# Patient Record
Sex: Male | Born: 1980
Health system: Southern US, Community
[De-identification: ages and names within clinical notes are randomized; demographics above are authoritative.]

## PROBLEM LIST (undated history)

## (undated) DIAGNOSIS — T63461A Toxic effect of venom of wasps, accidental (unintentional), initial encounter: Secondary | ICD-10-CM

## (undated) DIAGNOSIS — N182 Chronic kidney disease, stage 2 (mild): Secondary | ICD-10-CM

## (undated) DIAGNOSIS — E119 Type 2 diabetes mellitus without complications: Secondary | ICD-10-CM

## (undated) DIAGNOSIS — I639 Cerebral infarction, unspecified: Secondary | ICD-10-CM

## (undated) DIAGNOSIS — H9209 Otalgia, unspecified ear: Secondary | ICD-10-CM

## (undated) DIAGNOSIS — I10 Essential (primary) hypertension: Secondary | ICD-10-CM

## (undated) HISTORY — DX: Cerebral infarction, unspecified: I63.9

---

## 2011-05-17 ENCOUNTER — Encounter (HOSPITAL_COMMUNITY): Payer: Self-pay

## 2011-05-17 ENCOUNTER — Emergency Department (INDEPENDENT_AMBULATORY_CARE_PROVIDER_SITE_OTHER)
Admission: EM | Admit: 2011-05-17 | Discharge: 2011-05-17 | Disposition: A | Discharge status: Home / Self Care | Payer: Self-pay | Source: Home / Self Care | Attending: Emergency Medicine | Admitting: Emergency Medicine

## 2011-05-17 DIAGNOSIS — H11431 Conjunctival hyperemia, right eye: Secondary | ICD-10-CM

## 2011-05-17 DIAGNOSIS — H11439 Conjunctival hyperemia, unspecified eye: Secondary | ICD-10-CM

## 2011-05-17 MED ORDER — TOBRAMYCIN 0.3 % OP SOLN: 1.0000 [drp] | Freq: Four times a day (QID) | OPHTHALMIC | Status: AC

## 2011-05-17 NOTE — ED Notes (Signed)
Pt c/o right eye redness, pain, states had drainage and very sensitive to light. Symptoms started 2 days ago.

## 2011-05-17 NOTE — ED Provider Notes (Signed)
History     CSN: [id number]  Arrival date & time 05/17/11  [id number]   First MD Initiated Contact with Patient 05/17/11 [phone]      Chief Complaint  Patient presents with  . Conjunctivitis    redness to rt eye, drainage and photosensitive    (Consider location/radiation/quality/duration/timing/severity/associated sxs/prior treatment) HPI Comments: Started in the last few days with some eye redness, and itchy and feeling like he has some drainage in the morning Dont recall any injuries or rubbing eye  against anything, no sick contacts"      Patient is a 31 y.o. male presenting with conjunctivitis. The history is provided by the patient.  Conjunctivitis  The current episode started 2 days ago. The problem has been unchanged. The problem is moderate. Associated symptoms include eye itching, photophobia, eye pain and eye redness. Pertinent negatives include no fever, no decreased vision, no double vision, no ear pain, no headaches, no hearing loss, no rhinorrhea, no sore throat, no swollen glands and no neck pain. There is pain in the right eye. The eye pain is associated with movement. The eyelid exhibits swelling and redness.    History reviewed. No pertinent past medical history.  History reviewed. No pertinent past surgical history.  No family history on file.  History  Substance Use Topics  . Smoking status: Never Smoker   . Smokeless tobacco: Not on file  . Alcohol Use: No      Review of Systems  Constitutional: Negative for fever, activity change and fatigue.  HENT: Negative for hearing loss, ear pain, sore throat, rhinorrhea and neck pain.   Eyes: Positive for photophobia, pain, redness and itching. Negative for double vision and visual disturbance.  Neurological: Negative for weakness, numbness and headaches.    Allergies  Zithromax  Home Medications   Current Outpatient Rx  Name Route Sig Dispense Refill  . TOBRAMYCIN SULFATE 0.3 % OP SOLN Right Eye Place 1  drop into the right eye every 6 (six) hours. 5 mL 0    BP 154/113  Pulse 80  Temp(Src) 97.6 F (36.4 C) (Oral)  Resp 16  SpO2 95%  Physical Exam  Constitutional: He appears well-nourished. He appears lethargic.  HENT:  Mouth/Throat: No oropharyngeal exudate.  Eyes: EOM are normal. Right eye exhibits no discharge and no exudate. Left eye exhibits no exudate. Right conjunctiva is injected. No scleral icterus. Right pupil is not round.    Neurological: He appears lethargic. No cranial nerve deficit.    ED Course  Procedures (including critical care time)  Labs Reviewed - No data to display No results found.   1. Conjunctival hyperemia of right eye       MDM  Conjunctivitis like exam-no visual changes, no recent traumas or eye injuries, no nausea nor vomitting. No HA, incidentally noted elevated BP patient described  No ymtoms        Rosana Hoes, MD 05/17/11 1807

## 2011-05-17 NOTE — Discharge Instructions (Signed)
As during today's exam start with prescribed antibiotic eye drops and expect marked improvement in the next 48 hours if worsening symptoms or further changes have recommended you followup with the eye doctor as other conditions will need to be considered as discussed. If any specific symptoms such as nausea vomiting or fever should go to the emergency department for further evaluationHow to Take Your Blood Pressure  These instructions are only for electronic home blood pressure machines. You will need:   An automatic or semi-automatic blood pressure machine.   Fresh batteries for the blood pressure machine.  HOW DO I USE THESE TOOLS TO CHECK MY BLOOD PRESSURE?   There are 2 numbers that make up your blood pressure. For example: 120/80.   The first number (120 in our example) is called the "systolic pressure." It is a measure of the pressure in your blood vessels when your heart is pumping blood.   The second number (80 in our example) is called the "diastolic pressure." It is a measure of the pressure in your blood vessels when your heart is resting between beats.   Before you buy a home blood pressure machine, check the size of your arm so you can buy the right size cuff. Here is how to check the size of your arm:   Use a tape measure that shows both inches and centimeters.   Wrap the tape measure around the middle upper part of your arm. You may need someone to help you measure right.   Write down your arm measurement in both inches and centimeters.   To measure your blood pressure right, it is important to have the right size cuff.   If your arm is up to 13 inches (37 to 34 centimeters), get an adult cuff size.   If your arm is 13 to 17 inches (35 to 44 centimeters), get a large adult cuff size.   If your arm is 17 to 20 inches (45 to 52 centimeters), get an adult thigh cuff.   Try to rest or relax for at least 30 minutes before you check your blood pressure.   Do not smoke.   Do  not have any drinks with caffeine, such as:   Pop.   Coffee.   Tea.   Check your blood pressure in a quiet room.   Sit down and stretch out your arm on a table. Keep your arm at about the level of your heart. Let your arm relax.  GETTING BLOOD PRESSURE READINGS  Make sure you remove any tight-fighting clothing from your arm. Wrap the cuff around your upper arm. Wrap it just above the bend, and above where you felt the pulse. You should be able to slip a finger between the cuff and your arm. If you cannot slip a finger in the cuff, it is too tight and should be removed and rewrapped.   Some units requires you to manually pump up the arm cuff.   Automatic units inflate the cuff when you press a button.   Cuff deflation is automatic in both models.   After the cuff is inflated, the unit measures your blood pressure and pulse. The readings are displayed on a monitor. Hold still and breathe normally while the cuff is inflated.   Getting a reading takes less than a minute.   Some models store readings in a memory. Some provide a printout of readings.   Get readings at different times of the day. You should wait at least  5 minutes between readings. Take readings with you to your next doctor's visit.  Document Released: 01/30/2008 Document Revised: 02/05/2011 Document Reviewed: 01/30/2008 [facility] Patient Information 2012 [place].

## 2013-05-11 ENCOUNTER — Emergency Department (HOSPITAL_COMMUNITY)
Admission: EM | Admit: 2013-05-11 | Discharge: 2013-05-11 | Disposition: A | Discharge status: Home / Self Care | Payer: BC Managed Care – PPO | Attending: Emergency Medicine | Admitting: Emergency Medicine

## 2013-05-11 ENCOUNTER — Emergency Department (HOSPITAL_COMMUNITY): Payer: BC Managed Care – PPO

## 2013-05-11 ENCOUNTER — Encounter (HOSPITAL_COMMUNITY): Payer: Self-pay | Admitting: Emergency Medicine

## 2013-05-11 DIAGNOSIS — I10 Essential (primary) hypertension: Secondary | ICD-10-CM

## 2013-05-11 DIAGNOSIS — R5383 Other fatigue: Secondary | ICD-10-CM

## 2013-05-11 DIAGNOSIS — K920 Hematemesis: Secondary | ICD-10-CM | POA: Insufficient documentation

## 2013-05-11 DIAGNOSIS — R51 Headache: Secondary | ICD-10-CM | POA: Insufficient documentation

## 2013-05-11 DIAGNOSIS — R109 Unspecified abdominal pain: Secondary | ICD-10-CM | POA: Insufficient documentation

## 2013-05-11 DIAGNOSIS — R5381 Other malaise: Secondary | ICD-10-CM | POA: Insufficient documentation

## 2013-05-11 DIAGNOSIS — R112 Nausea with vomiting, unspecified: Secondary | ICD-10-CM | POA: Insufficient documentation

## 2013-05-11 LAB — URINE MICROSCOPIC-ADD ON

## 2013-05-11 LAB — CBC WITH DIFFERENTIAL/PLATELET
Basophils Absolute: 0 10*3/uL (ref 0.0–0.1)
Basophils Relative: 0 % (ref 0–1)
EOS ABS: 0 10*3/uL (ref 0.0–0.7)
EOS PCT: 0 % (ref 0–5)
HCT: 44.4 % (ref 39.0–52.0)
HEMOGLOBIN: 16.2 g/dL (ref 13.0–17.0)
LYMPHS ABS: 1.3 10*3/uL (ref 0.7–4.0)
Lymphocytes Relative: 13 % (ref 12–46)
MCH: 28.2 pg (ref 26.0–34.0)
MCHC: 36.5 g/dL — ABNORMAL HIGH (ref 30.0–36.0)
MCV: 77.2 fL — AB (ref 78.0–100.0)
MONOS PCT: 4 % (ref 3–12)
Monocytes Absolute: 0.3 10*3/uL (ref 0.1–1.0)
Neutro Abs: 8.1 10*3/uL — ABNORMAL HIGH (ref 1.7–7.7)
Neutrophils Relative %: 83 % — ABNORMAL HIGH (ref 43–77)
PLATELETS: 238 10*3/uL (ref 150–400)
RBC: 5.75 MIL/uL (ref 4.22–5.81)
RDW: 13.1 % (ref 11.5–15.5)
WBC: 9.8 10*3/uL (ref 4.0–10.5)

## 2013-05-11 LAB — LIPASE, BLOOD: LIPASE: 20 U/L (ref 11–59)

## 2013-05-11 LAB — URINALYSIS, ROUTINE W REFLEX MICROSCOPIC
Bilirubin Urine: NEGATIVE
Glucose, UA: >1000 mg/dL — AB
Ketones, ur: NEGATIVE mg/dL
Leukocytes, UA: NEGATIVE
NITRITE: NEGATIVE
Protein, ur: >300 mg/dL — AB
SPECIFIC GRAVITY, URINE: 1.041 — AB (ref 1.005–1.030)
UROBILINOGEN UA: 0.2 mg/dL (ref 0.0–1.0)
pH: 5 (ref 5.0–8.0)

## 2013-05-11 LAB — COMPREHENSIVE METABOLIC PANEL
ALT: 22 U/L (ref 0–53)
AST: 18 U/L (ref 0–37)
Albumin: 4.3 g/dL (ref 3.5–5.2)
Alkaline Phosphatase: 84 U/L (ref 39–117)
BUN: 16 mg/dL (ref 6–23)
CALCIUM: 9.9 mg/dL (ref 8.4–10.5)
CO2: 23 mEq/L (ref 19–32)
CREATININE: 1.11 mg/dL (ref 0.50–1.35)
Chloride: 100 mEq/L (ref 96–112)
GFR calc non Af Amer: 88 mL/min — ABNORMAL LOW (ref >90)
GLUCOSE: 261 mg/dL — AB (ref 70–99)
Potassium: 3.6 mEq/L — ABNORMAL LOW (ref 3.7–5.3)
Sodium: 139 mEq/L (ref 137–147)
TOTAL PROTEIN: 8.6 g/dL — AB (ref 6.0–8.3)
Total Bilirubin: 0.8 mg/dL (ref 0.3–1.2)

## 2013-05-11 LAB — POC OCCULT BLOOD, ED: FECAL OCCULT BLD: NEGATIVE

## 2013-05-11 MED ORDER — ONDANSETRON HCL 4 MG/2ML IJ SOLN
4.0000 mg | INTRAMUSCULAR | Status: AC
  Administered 2013-05-11: 4 mg via INTRAVENOUS
  Filled 2013-05-11: qty 2

## 2013-05-11 MED ORDER — SODIUM CHLORIDE 0.9 % IV BOLUS (SEPSIS)
500.0000 mL | Freq: Once | INTRAVENOUS | Status: AC
  Administered 2013-05-11: 500 mL via INTRAVENOUS

## 2013-05-11 MED ORDER — ONDANSETRON HCL 4 MG/2ML IJ SOLN
4.0000 mg | Freq: Once | INTRAMUSCULAR | Status: AC
  Administered 2013-05-11: 4 mg via INTRAVENOUS
  Filled 2013-05-11: qty 2

## 2013-05-11 MED ORDER — MORPHINE SULFATE 4 MG/ML IJ SOLN
8.0000 mg | Freq: Once | INTRAMUSCULAR | Status: AC
  Administered 2013-05-11: 8 mg via INTRAVENOUS
  Filled 2013-05-11: qty 2

## 2013-05-11 MED ORDER — ONDANSETRON HCL 4 MG PO TABS: 4.0000 mg | ORAL_TABLET | Freq: Four times a day (QID) | ORAL | Status: DC

## 2013-05-11 MED ORDER — PANTOPRAZOLE SODIUM 40 MG IV SOLR
40.0000 mg | Freq: Once | INTRAVENOUS | Status: AC
  Administered 2013-05-11: 40 mg via INTRAVENOUS
  Filled 2013-05-11: qty 40

## 2013-05-11 MED ORDER — HYDROCHLOROTHIAZIDE 12.5 MG PO CAPS
12.5000 mg | ORAL_CAPSULE | Freq: Every day | ORAL | Status: DC
  Administered 2013-05-11: 12.5 mg via ORAL
  Filled 2013-05-11: qty 1

## 2013-05-11 MED ORDER — OMEPRAZOLE 20 MG PO CPDR: 20.0000 mg | DELAYED_RELEASE_CAPSULE | Freq: Every day | ORAL | Status: DC

## 2013-05-11 MED ORDER — HYDROCHLOROTHIAZIDE 12.5 MG PO TABS: 12.5000 mg | ORAL_TABLET | Freq: Every day | ORAL | Status: DC

## 2013-05-11 NOTE — ED Provider Notes (Signed)
2030 - Patient care assumed from Domenic Moras, PA-C at shift change. Patient presents to the emergency department for nausea and vomiting suspect with small amounts of blood and free of clots. Patient also with complaint of headache. He was found to be hypertensive on arrival. Patient currently has CT head pending. The remainder of his workup has been unremarkable. Plan discussed with Haywood Pao which includes d/c home if improved abdominal TTP and negative head CT scan.  2210 - Patient's head CT is negative today. On my evaluation of the patient, he is sleeping soundly in the exam room bed in no visible or audible discomfort. Abdomen palpated with no areas of significant focal tenderness. No peritoneal signs or guarding. Patient is stable for discharge today with prescription for omeprazole and hydrochlorothiazide to take daily for symptoms and blood pressure. Have prescribed Zofran to take as needed for nausea/vomiting. Return precautions discussed with patient's wife at bedside and provided upon discharge. Patient agreeable to plan with no unaddressed concerns.   Results for orders placed during the hospital encounter of 05/11/13  CBC WITH DIFFERENTIAL      Result Value Ref Range   WBC 9.8  4.0 - 10.5 K/uL   RBC 5.75  4.22 - 5.81 MIL/uL   Hemoglobin 16.2  13.0 - 17.0 g/dL   HCT 44.4  39.0 - 52.0 %   MCV 77.2 (*) 78.0 - 100.0 fL   MCH 28.2  26.0 - 34.0 pg   MCHC 36.5 (*) 30.0 - 36.0 g/dL   RDW 13.1  11.5 - 15.5 %   Platelets 238  150 - 400 K/uL   Neutrophils Relative % 83 (*) 43 - 77 %   Neutro Abs 8.1 (*) 1.7 - 7.7 K/uL   Lymphocytes Relative 13  12 - 46 %   Lymphs Abs 1.3  0.7 - 4.0 K/uL   Monocytes Relative 4  3 - 12 %   Monocytes Absolute 0.3  0.1 - 1.0 K/uL   Eosinophils Relative 0  0 - 5 %   Eosinophils Absolute 0.0  0.0 - 0.7 K/uL   Basophils Relative 0  0 - 1 %   Basophils Absolute 0.0  0.0 - 0.1 K/uL  COMPREHENSIVE METABOLIC PANEL      Result Value Ref Range   Sodium 139  137 -  147 mEq/L   Potassium 3.6 (*) 3.7 - 5.3 mEq/L   Chloride 100  96 - 112 mEq/L   CO2 23  19 - 32 mEq/L   Glucose, Bld 261 (*) 70 - 99 mg/dL   BUN 16  6 - 23 mg/dL   Creatinine, Ser 1.11  0.50 - 1.35 mg/dL   Calcium 9.9  8.4 - 10.5 mg/dL   Total Protein 8.6 (*) 6.0 - 8.3 g/dL   Albumin 4.3  3.5 - 5.2 g/dL   AST 18  0 - 37 U/L   ALT 22  0 - 53 U/L   Alkaline Phosphatase 84  39 - 117 U/L   Total Bilirubin 0.8  0.3 - 1.2 mg/dL   GFR calc non Af Amer 88 (*) >90 mL/min   GFR calc Af Amer >90  >90 mL/min  LIPASE, BLOOD      Result Value Ref Range   Lipase 20  11 - 59 U/L  URINALYSIS, ROUTINE W REFLEX MICROSCOPIC      Result Value Ref Range   Color, Urine YELLOW  YELLOW   APPearance CLEAR  CLEAR   Specific Gravity, Urine 1.041 (*)  1.005 - 1.030   pH 5.0  5.0 - 8.0   Glucose, UA >1000 (*) NEGATIVE mg/dL   Hgb urine dipstick TRACE (*) NEGATIVE   Bilirubin Urine NEGATIVE  NEGATIVE   Ketones, ur NEGATIVE  NEGATIVE mg/dL   Protein, ur >300 (*) NEGATIVE mg/dL   Urobilinogen, UA 0.2  0.0 - 1.0 mg/dL   Nitrite NEGATIVE  NEGATIVE   Leukocytes, UA NEGATIVE  NEGATIVE  URINE MICROSCOPIC-ADD ON      Result Value Ref Range   WBC, UA 0-2  <3 WBC/hpf   RBC / HPF 0-2  <3 RBC/hpf   Bacteria, UA RARE  RARE   Casts HYALINE CASTS (*) NEGATIVE   Urine-Other MUCOUS PRESENT    POC OCCULT BLOOD, ED      Result Value Ref Range   Fecal Occult Bld NEGATIVE  NEGATIVE   Ct Head Wo Contrast  05/11/2013   CLINICAL DATA:  Nausea, vomiting, hypertension  EXAM: CT HEAD WITHOUT CONTRAST  TECHNIQUE: Contiguous axial images were obtained from the base of the skull through the vertex without contrast.  COMPARISON:  None  FINDINGS: Normal appearance of the intracranial structures. No evidence for acute hemorrhage, mass lesion, midline shift, hydrocephalus or large infarct. No acute bony abnormality. The visualized sinuses are clear.  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  No acute skull abnormality.  IMPRESSION: No acute intracranial abnormality.   Electronically Signed   By: Daryll Brod M.D.   On: 05/11/2013 20:26      Antonietta Breach, PA-C 05/11/13 2221

## 2013-05-11 NOTE — Discharge Instructions (Signed)
Take omeprazole and hydrochlorothiazide as prescribed for symptoms. Recommend you take Zofran as needed for nausea/vomiting. Followup with a primary care provider regarding your symptoms. Return to the emergency department if symptoms worsen such as if abdominal pain worsens, you began to vomit large amounts of blood or clots, you develop a fever, or if you develop any of the symptoms listed below.  Nausea and Vomiting Nausea is a sick feeling that often comes before throwing up (vomiting). Vomiting is a reflex where stomach contents come out of your mouth. Vomiting can cause severe loss of body fluids (dehydration). Children and elderly adults can become dehydrated quickly, especially if they also have diarrhea. Nausea and vomiting are symptoms of a condition or disease. It is important to find the cause of your symptoms. CAUSES   Direct irritation of the stomach lining. This irritation can result from increased acid production (gastroesophageal reflux disease), infection, food poisoning, taking certain medicines (such as nonsteroidal anti-inflammatory drugs), alcohol use, or tobacco use.  Signals from the brain.These signals could be caused by a headache, heat exposure, an inner ear disturbance, increased pressure in the brain from injury, infection, a tumor, or a concussion, pain, emotional stimulus, or metabolic problems.  An obstruction in the gastrointestinal tract (bowel obstruction).  Illnesses such as diabetes, hepatitis, gallbladder problems, appendicitis, kidney problems, cancer, sepsis, atypical symptoms of a heart attack, or eating disorders.  Medical treatments such as chemotherapy and radiation.  Receiving medicine that makes you sleep (general anesthetic) during surgery. DIAGNOSIS Your caregiver may ask for tests to be done if the problems do not improve after a few days. Tests may also be done if symptoms are severe or if the reason for the nausea and vomiting is not clear. Tests  may include:  Urine tests.  Blood tests.  Stool tests.  Cultures (to look for evidence of infection).  X-rays or other imaging studies. Test results can help your caregiver make decisions about treatment or the need for additional tests. TREATMENT You need to stay well hydrated. Drink frequently but in small amounts.You may wish to drink water, sports drinks, clear broth, or eat frozen ice pops or gelatin dessert to help stay hydrated.When you eat, eating slowly may help prevent nausea.There are also some antinausea medicines that may help prevent nausea. HOME CARE INSTRUCTIONS   Take all medicine as directed by your caregiver.  If you do not have an appetite, do not force yourself to eat. However, you must continue to drink fluids.  If you have an appetite, eat a normal diet unless your caregiver tells you differently.  Eat a variety of complex carbohydrates (rice, wheat, potatoes, bread), lean meats, yogurt, fruits, and vegetables.  Avoid high-fat foods because they are more difficult to digest.  Drink enough water and fluids to keep your urine clear or pale yellow.  If you are dehydrated, ask your caregiver for specific rehydration instructions. Signs of dehydration may include:  Severe thirst.  Dry lips and mouth.  Dizziness.  Dark urine.  Decreasing urine frequency and amount.  Confusion.  Rapid breathing or pulse. SEEK IMMEDIATE MEDICAL CARE IF:   You have blood or brown flecks (like coffee grounds) in your vomit.  You have black or bloody stools.  You have a severe headache or stiff neck.  You are confused.  You have severe abdominal pain.  You have chest pain or trouble breathing.  You do not urinate at least once every 8 hours.  You develop cold or clammy skin.  You continue to vomit for longer than 24 to 48 hours.  You have a fever. MAKE SURE YOU:   Understand these instructions.  Will watch your condition.  Will get help right away  if you are not doing well or get worse. Document Released: 02/16/2005 Document Revised: 05/11/2011 Document Reviewed: 07/16/2010 [facility] Patient Information 2014 [place], [place].  Emergency Department Resource Guide 1) Find a Doctor and Pay Out of Pocket Although you won't have to find out who is covered by your insurance plan, it is a good idea to ask around and get recommendations. You will then need to call the office and see if the doctor you have chosen will accept you as a new patient and what types of options they offer for patients who are self-pay. Some doctors offer discounts or will set up payment plans for their patients who do not have insurance, but you will need to ask so you aren't surprised when you get to your appointment.  2) Contact Your Local Health Department Not all health departments have doctors that can see patients for sick visits, but many do, so it is worth a call to see if yours does. If you don't know where your local health department is, you can check in your phone book. The CDC also has a tool to help you locate your state's health department, and many state websites also have listings of all of their local health departments.  3) Find a [facility] If your illness is not likely to be very severe or complicated, you may want to try a walk in clinic. These are popping up all over the country in pharmacies, drugstores, and shopping centers. They're usually staffed by nurse practitioners or physician assistants that have been trained to treat common illnesses and complaints. They're usually fairly quick and inexpensive. However, if you have serious medical issues or chronic medical problems, these are probably not your best option.  No Primary Care Doctor: - Call Health Connect at  [phone] - they can help you locate a primary care doctor that  accepts your insurance, provides certain services, etc. - Physician Referral Service- [phone]  Chronic Pain  Problems: Organization         Address  Phone   Notes  [facility]  [phone] Patients need to be referred by their primary care doctor.   Medication Assistance: Organization         Address  Phone   Notes  [facility] [address] [place], [place] [address] [phone] --Must be a resident of [facility] -- Must have NO insurance coverage whatsoever (no Medicaid/ Medicare, etc.) -- The pt. MUST have a primary care doctor that directs their care regularly and follows them in the community   Med[facility]ist  [phone]   [employer]  [phone]    Agencies that provide inexpensive medical care: Organization         Address  Phone   Notes  [place]  [phone]   [facility]    [phone]   [facility] [place], [place] [address] [phone]   [place] [address], [place] [phone]   Planned Parenthood    [phone]   [facility]    [phone]   Latta and [place]ine [place], [place] Phone:  [phone],  Fax:  [phone] Hours of Operation:  9 am - 6 pm, M-F.  Also accepts Medicaid/Medicare and self-pay.  [facility] for [place], [address], [place] Phone: [phone], Fax: [phone]. Hours of Operation:  8:30 am - 5:30 pm, M-F.  Also accepts Medicaid and self-pay.  [facility] [address] [place] Phone: [phone]   [place], [place], [place] [phone], Ext. [phone] Mondays & Thursdays: 7-9 AM.  First 15 patients are seen on a first come, first serve basis.    [place] Providers:  Organization         Address  Phone   Notes  [facility] [address], [place] [phone] Also  accepts self-pay patients.  [facility] [id number] [place], [place]  [phone]   [place], [address], [place] [phone]   [facility] [address], [place] [phone]   Lucianne Lei [address], [place]   [phone] Only accepts [place] Access [place] patients after they have their name applied to their card.   Self-Pay (no insurance) in [facility]:  Organization         Address  Phone   Notes  Sickle Cell Patients, [facility] Internal Medicine [place] [phone]   [facility] [place] [phone]   [facility] [place]  [place], [place], [place] [phone]   [facility]/Dr. Osei-Bonsu  [address], [place] or [address], [place] [phone] Phone number for both [place] and [place] locations is the same.  Urgent Medical and [facility] [address] [place] [phone]   [facility] [address] [place] or [address] [phone]   [facility] [address] [place] [phone], phone; [phone], fax Sees patients 1st and 3rd Saturday of every month.  Must not qualify for public or private insurance (i.e. Medicaid, Medicare, [place] Health Choice, Veterans' Benefits)  Household income should be no more than 200% of the poverty level The clinic cannot treat you if you are pregnant or think you are pregnant  Sexually transmitted diseases are not treated at the clinic.    Dental Care: Organization         Address  Phone  Notes  [facility] Department of [facility] [place] [phone] Accepts children up to age 31 who are enrolled in [place] or [place]; pregnant  women with a Medicaid card; and children who have applied for Medicaid or [place] Health Choice, but were declined, whose parents can pay a reduced fee at time of service.  [facility] Department of [facility]  [address], [place] [phone] Accepts children up to age 14 who are enrolled in [place] or [place]; pregnant women with a Medicaid card; and children who have applied for Medicaid or [place] Health Choice, but were declined, whose parents can pay a reduced fee at time of service.  [facility] Adult Dental Access PROGRAM  [place] [phone] Patients are seen by appointment only. Walk-ins are not  accepted. [place] will see patients 39 years of age and older. Monday - Tuesday (8am-5pm) Most Wednesdays (8:30-5pm) $30 per visit, cash only  [facility] Adult Dental Access PROGRAM  [address], [facility] [phone] Patients are seen by appointment only. Walk-ins are not accepted. [place] will see patients 37 years of age and older. One Wednesday Evening (Monthly: Volunteer Based).  $30 per visit, cash only  [place]  [phone] for adults; Children under age 12, call Graduate Pediatric Dentistry at [phone]. Children aged 36-14, please call [phone] to request a pediatric application.  Dental services are provided in all areas of dental care including fillings, crowns and bridges, complete and partial dentures, implants, gum treatment, root canals, and extractions. Preventive care is also provided. Treatment is provided to both adults and children. Patients are selected via a lottery and there is often a waiting list.   [facility] [address] [place]  [phone] [email or web address]   [facility] [address] [place], [place] [phone], Ext. [phone] Second and Fourth Thursday of each month, opens at 6:30 AM; Clinic ends at 9 AM.  Patients are  seen on a first-come first-served basis, and a limited number are seen during each clinic.   [facility]  [address] Hillard Danker [place], [place] [phone]   Eligibility Requirements You must have lived in [place], [place], or [place] counties for at least the last three months.   You cannot be eligible for state or federal sponsored [employer], including [employer], [place], or [employer].   You generally cannot be eligible for healthcare insurance through your employer.    How to apply: Eligibility screenings are held every Tuesday and Wednesday afternoon from 1:00 pm until 4:00 pm. You do not need an appointment for the interview!  [facility] [address] [place], [place]   [place]  [place] Department  [place]  [phone]    Behavioral Health Resources in the Community: Intensive Outpatient Programs Organization         Address  Phone  Notes  [place] [place] [facility]. [address], [place], [place] [phone]   [facility] [address] [place], [place]   ADS: [facility] [address], [place], [place]   [place] [address],  [place], [place] or [phone]   Substance Abuse Resources Organization         Address  Phone  Notes  Alcohol and Drug Services  [phone]   [place]  [phone]   [facility] [place]   Chinita Pester  [phone]   Residential & Outpatient Substance Abuse Program  [phone]   Psychological Services Organization         Address  Phone  Notes  [facility] [place]  [place]  [phone]   [place] [address] [place] or [phone]    Mobile Crisis  Teams Organization         Address  Phone  Notes  [facility], Mobile Crisis Care Unit  [phone]   [facility]  [address] [place], [place]   [facility] [address], [place] [phone]    Self-Help/Support Groups  Organization         Address  Phone             Notes  [facility] [place] - variety of support groups  [place] Call for more information  Narcotics Anonymous (NA), [facility] [address], [employer] [place]  2 meetings at this location   Special educational needs teacher         Address  Phone  Notes  ASAP Residential Treatment [place],    [place]  [phone]   [facility]  [address] [place] [id number], [place], [place]   [place], [place] [phone] Admissions: 8am-3pm M-F  Incentives Substance [place] [address],    [place], [place] [phone]   [facility] [address], [place], [place]   [facility] [address],  [place], [place]   [facility] Programs - Intensive Outpatient [address] Kristeen [address], [place], [place]   [facility] [place].,  [place], [place] [phone] or [phone]   [facility] ([facility]) [address], [place], [place] Accepts Medicaid  [facility] [place] [address],  [place] [phone] Substance Abuse/Addiction Treatment   [facility] Organization         Address  Phone  Notes  [facility]  [phone]   Domenic Schwab, PhD [address] [place]lis [place], [place]   [phone] or [phone]   [place], [place] [phone]   [facility] [address], [place], [place] [phone]  Insurance/Medicaid/sponsorship through [facility] [address], Ste [place]                                    [place], [place] [phone] [place] [address] [place], [place] [phone]    Dr. Adele Schilder  [phone]   [facility] Dept. 1) [address] [place] 2) [place] 3)  [place] [address], [place] [phone]  [phone]   [place] [phone] or [phone] (After Hours)

## 2013-05-11 NOTE — ED Notes (Addendum)
Pt reports n/v that started at 1300, reports vomiting bright red blood. Reports lower abd pain, denies diarrhea. Hypertensive at triage, 205/119 pt denies hx of htn.

## 2013-05-11 NOTE — ED Provider Notes (Signed)
Medical screening examination/treatment/procedure(s) were conducted as a shared visit with non-physician practitioner(s) and myself.  I personally evaluated the patient during the encounter.   EKG Interpretation None       Results for orders placed during the hospital encounter of 05/11/13  CBC WITH DIFFERENTIAL      Result Value Ref Range   WBC 9.8  4.0 - 10.5 K/uL   RBC 5.75  4.22 - 5.81 MIL/uL   Hemoglobin 16.2  13.0 - 17.0 g/dL   HCT 44.4  39.0 - 52.0 %   MCV 77.2 (*) 78.0 - 100.0 fL   MCH 28.2  26.0 - 34.0 pg   MCHC 36.5 (*) 30.0 - 36.0 g/dL   RDW 13.1  11.5 - 15.5 %   Platelets 238  150 - 400 K/uL   Neutrophils Relative % 83 (*) 43 - 77 %   Neutro Abs 8.1 (*) 1.7 - 7.7 K/uL   Lymphocytes Relative 13  12 - 46 %   Lymphs Abs 1.3  0.7 - 4.0 K/uL   Monocytes Relative 4  3 - 12 %   Monocytes Absolute 0.3  0.1 - 1.0 K/uL   Eosinophils Relative 0  0 - 5 %   Eosinophils Absolute 0.0  0.0 - 0.7 K/uL   Basophils Relative 0  0 - 1 %   Basophils Absolute 0.0  0.0 - 0.1 K/uL  COMPREHENSIVE METABOLIC PANEL      Result Value Ref Range   Sodium 139  137 - 147 mEq/L   Potassium 3.6 (*) 3.7 - 5.3 mEq/L   Chloride 100  96 - 112 mEq/L   CO2 23  19 - 32 mEq/L   Glucose, Bld 261 (*) 70 - 99 mg/dL   BUN 16  6 - 23 mg/dL   Creatinine, Ser 1.11  0.50 - 1.35 mg/dL   Calcium 9.9  8.4 - 10.5 mg/dL   Total Protein 8.6 (*) 6.0 - 8.3 g/dL   Albumin 4.3  3.5 - 5.2 g/dL   AST 18  0 - 37 U/L   ALT 22  0 - 53 U/L   Alkaline Phosphatase 84  39 - 117 U/L   Total Bilirubin 0.8  0.3 - 1.2 mg/dL   GFR calc non Af Amer 88 (*) >90 mL/min   GFR calc Af Amer >90  >90 mL/min  LIPASE, BLOOD      Result Value Ref Range   Lipase 20  11 - 59 U/L  URINALYSIS, ROUTINE W REFLEX MICROSCOPIC      Result Value Ref Range   Color, Urine YELLOW  YELLOW   APPearance CLEAR  CLEAR   Specific Gravity, Urine 1.041 (*) 1.005 - 1.030   pH 5.0  5.0 - 8.0   Glucose, UA >1000 (*) NEGATIVE mg/dL   Hgb urine dipstick  TRACE (*) NEGATIVE   Bilirubin Urine NEGATIVE  NEGATIVE   Ketones, ur NEGATIVE  NEGATIVE mg/dL   Protein, ur >300 (*) NEGATIVE mg/dL   Urobilinogen, UA 0.2  0.0 - 1.0 mg/dL   Nitrite NEGATIVE  NEGATIVE   Leukocytes, UA NEGATIVE  NEGATIVE  URINE MICROSCOPIC-ADD ON      Result Value Ref Range   WBC, UA 0-2  <3 WBC/hpf   RBC / HPF 0-2  <3 RBC/hpf   Bacteria, UA RARE  RARE   Casts HYALINE CASTS (*) NEGATIVE   Urine-Other MUCOUS PRESENT    POC OCCULT BLOOD, ED      Result Value Ref Range  Fecal Occult Bld NEGATIVE  NEGATIVE   No results found.  Patient with diagnosis of hypertension in 2010. Never been on any medications. Suspect his blood pressures been running high for a while. Today the events that brought him in at about 10 10:30 in the morning he started with a headache that was bilateral frontal no acute visual changes or photophobia no focal findings. No fever. Then at 1:00 he started with some vomiting he's occasionally vomits some specks of blood no pool of blood. The headache resolved at 2:00 in the afternoon. But the vomiting has persisted. He has no sick contact exposure. Patient's renal function here is normal urinalysis does show some protein patient has no sniffing and leukocytosis or anemia. Patient's head CT is pending. If head CT is negative patient can be treated with antinausea medicine perhaps some Pepcid to calm the stomach further patient is being treated with protonic here but will be expensive for him to take that at home. Also patient will be started on antihypertensive meds probably hydrochlorothiazide given referral information for further followup. Do not feel that this is a hypertensive emergency. Patient has no evidence of severe chest pain no or evidence of significant shortness of breath no severe headache currently no focal findings. Nothing consistent with a stroke.  Mervin Kung, MD 05/11/13 2004

## 2013-05-11 NOTE — ED Provider Notes (Signed)
CSN: [id number]     Arrival date & time 05/11/13  1714 History   First MD Initiated Contact with Patient 05/11/13 1918     Chief Complaint  Patient presents with  . Emesis  . Hematemesis     (Consider location/radiation/quality/duration/timing/severity/associated sxs/prior Treatment) The history is provided by the patient.    33 year old male who presents complaining of nausea or vomiting. Patient states since 1:00 today he has been feeling nauseous, having mid abdominal pain, and has vomited 4 times. He is vomiting up food product with specks of bright red blood. States blood has been getting progressively worse. He endorsed having headache, and was started at 10:00 in improves around 2pm. Headache is bitemporal. No active headache. Vomiting around 1pm.  Report normal bowel movement, without hematochezia or melena.  Patient also states that he has felt drowsy since he arrived. History of high blood pressure, however patient does not have primary care provider and does not take any medication for hypertension. Denies alcohol use. Denies chest pain, SOB, back pain, dysuria, urinary frequency, focal weakness, numbness, swelling of extremities or calf pain.  History reviewed. No pertinent past medical history. History reviewed. No pertinent past surgical history. History reviewed. No pertinent family history. History  Substance Use Topics  . Smoking status: Never Smoker   . Smokeless tobacco: Not on file  . Alcohol Use: No    Review of Systems  Constitutional: Positive for fatigue.  Respiratory: Negative for cough, chest tightness and shortness of breath.   Cardiovascular: Negative for chest pain and leg swelling.  Gastrointestinal: Positive for nausea, vomiting and abdominal pain. Negative for diarrhea, constipation and blood in stool.  Genitourinary: Negative for dysuria, hematuria and difficulty urinating.  Musculoskeletal: Negative for back pain.  Neurological: Positive for weakness  and headaches.      Allergies  Zithromax  Home Medications  No current outpatient prescriptions on file. BP 230/139  Pulse 104  Temp(Src) 98.8 F (37.1 C) (Oral)  Resp 18  SpO2 95% Physical Exam  Constitutional: He is oriented to person, place, and time. He appears well-developed and well-nourished. No distress.  Eyes: Conjunctivae and EOM are normal. Pupils are equal, round, and reactive to light.  Neck: Normal range of motion. Neck supple.  Cardiovascular: Normal rate, regular rhythm and normal heart sounds.   Pulmonary/Chest: Effort normal and breath sounds normal.  Abdominal: Soft. There is tenderness. There is no rebound and no guarding.  Tender to moderate palpation in epigastric,lower right, lower left, and suprapubic abdomen  Genitourinary: Rectum normal and prostate normal.  Musculoskeletal: He exhibits no edema.  Neurological: He is alert and oriented to person, place, and time.  Neurologic exam:  Speech clear, pupils equal round reactive to light, extraocular movements intact  Normal peripheral visual fields Cranial nerves III through XII normal including no facial droop Follows commands, moves all extremities x4, normal strength to bilateral upper and lower extremities at all major muscle groups including grip Sensation normal to light touch Coordination intact, no limb ataxia, finger-nose-finger normal Rapid alternating movements normal No pronator drift Gait normal   Skin: Skin is warm and dry. He is not diaphoretic.  Psychiatric: He has a normal mood and affect.    ED Course  Procedures (including critical care time)  8:19 PM Patient here presents complaining of mild low abnormal pain with nausea, vomiting with specks of bright red blood. He also has elevated blood pressure of 230/139 today. He has no strokelike symptoms. Does have history of high blood  pressure but not taking medication. His blood pressure has dropped down to 99991111 systolic without any  intervention. Head CT ordered to rule out acute stroke. He has some abdominal pain he has a nonsurgical abdomen. His workup has been fairly unremarkable. He does have protein in his urine however normal renal function. Patient would likely benefit from PPI, H2 blocker, and hydrochlorothiazide at discharge. Care discussed with her, provider who will continue to monitor patient and we'll reevaluate patient.  CAre discussed with Dr. Rogene Houston.   Labs Review Labs Reviewed  CBC WITH DIFFERENTIAL - Abnormal; Notable for the following:    MCV 77.2 (*)    MCHC 36.5 (*)    Neutrophils Relative % 83 (*)    Neutro Abs 8.1 (*)    All other components within normal limits  COMPREHENSIVE METABOLIC PANEL - Abnormal; Notable for the following:    Potassium 3.6 (*)    Glucose, Bld 261 (*)    Total Protein 8.6 (*)    GFR calc non Af Amer 88 (*)    All other components within normal limits  URINALYSIS, ROUTINE W REFLEX MICROSCOPIC - Abnormal; Notable for the following:    Specific Gravity, Urine 1.041 (*)    Glucose, UA >1000 (*)    Hgb urine dipstick TRACE (*)    Protein, ur >300 (*)    All other components within normal limits  URINE MICROSCOPIC-ADD ON - Abnormal; Notable for the following:    Casts HYALINE CASTS (*)    All other components within normal limits  LIPASE, BLOOD  POC OCCULT BLOOD, ED   Imaging Review No results found.   EKG Interpretation None      MDM   Final diagnoses:  Hypertension  Abdominal pain  Hematemesis   BP 163/94  Pulse 102  Temp(Src) 98.8 F (37.1 C) (Oral)  Resp 16  SpO2 97%     Domenic Moras, PA-C 05/11/13 2022

## 2013-05-13 NOTE — ED Provider Notes (Signed)
Medical screening examination/treatment/procedure(s) were performed by non-physician practitioner and as supervising physician I was immediately available for consultation/collaboration.   EKG Interpretation None        Sharyon Cable, MD 05/13/13 1930

## 2013-05-16 NOTE — ED Provider Notes (Signed)
Medical screening examination/treatment/procedure(s) were performed by non-physician practitioner and as supervising physician I was immediately available for consultation/collaboration.   EKG Interpretation None       Mervin Kung, MD 05/16/13 [phone]

## 2013-09-28 ENCOUNTER — Encounter (HOSPITAL_COMMUNITY): Payer: Self-pay | Admitting: Emergency Medicine

## 2013-09-28 ENCOUNTER — Emergency Department (HOSPITAL_COMMUNITY)
Admission: EM | Admit: 2013-09-28 | Discharge: 2013-09-29 | Disposition: A | Discharge status: Home / Self Care | Payer: BC Managed Care – PPO | Attending: Emergency Medicine | Admitting: Emergency Medicine

## 2013-09-28 DIAGNOSIS — I10 Essential (primary) hypertension: Secondary | ICD-10-CM | POA: Insufficient documentation

## 2013-09-28 DIAGNOSIS — J029 Acute pharyngitis, unspecified: Secondary | ICD-10-CM | POA: Insufficient documentation

## 2013-09-28 DIAGNOSIS — B9789 Other viral agents as the cause of diseases classified elsewhere: Secondary | ICD-10-CM | POA: Insufficient documentation

## 2013-09-28 DIAGNOSIS — R509 Fever, unspecified: Secondary | ICD-10-CM | POA: Insufficient documentation

## 2013-09-28 DIAGNOSIS — B349 Viral infection, unspecified: Secondary | ICD-10-CM

## 2013-09-28 DIAGNOSIS — H538 Other visual disturbances: Secondary | ICD-10-CM | POA: Insufficient documentation

## 2013-09-28 HISTORY — DX: Essential (primary) hypertension: I10

## 2013-09-28 LAB — CBC WITH DIFFERENTIAL/PLATELET
Basophils Absolute: 0 10*3/uL (ref 0.0–0.1)
Basophils Relative: 0 % (ref 0–1)
EOS ABS: 0 10*3/uL (ref 0.0–0.7)
Eosinophils Relative: 0 % (ref 0–5)
HCT: 42 % (ref 39.0–52.0)
HEMOGLOBIN: 14.4 g/dL (ref 13.0–17.0)
LYMPHS ABS: 1.9 10*3/uL (ref 0.7–4.0)
LYMPHS PCT: 23 % (ref 12–46)
MCH: 28.1 pg (ref 26.0–34.0)
MCHC: 34.3 g/dL (ref 30.0–36.0)
MCV: 82 fL (ref 78.0–100.0)
MONOS PCT: 7 % (ref 3–12)
Monocytes Absolute: 0.6 10*3/uL (ref 0.1–1.0)
NEUTROS PCT: 70 % (ref 43–77)
Neutro Abs: 6 10*3/uL (ref 1.7–7.7)
Platelets: 219 10*3/uL (ref 150–400)
RBC: 5.12 MIL/uL (ref 4.22–5.81)
RDW: 12.7 % (ref 11.5–15.5)
WBC: 8.5 10*3/uL (ref 4.0–10.5)

## 2013-09-28 LAB — URINALYSIS, ROUTINE W REFLEX MICROSCOPIC
Glucose, UA: 100 mg/dL — AB
HGB URINE DIPSTICK: NEGATIVE
KETONES UR: 15 mg/dL — AB
Leukocytes, UA: NEGATIVE
NITRITE: NEGATIVE
Specific Gravity, Urine: 1.034 — ABNORMAL HIGH (ref 1.005–1.030)
UROBILINOGEN UA: 1 mg/dL (ref 0.0–1.0)
pH: 5.5 (ref 5.0–8.0)

## 2013-09-28 LAB — COMPREHENSIVE METABOLIC PANEL
ALK PHOS: 67 U/L (ref 39–117)
ALT: 15 U/L (ref 0–53)
ANION GAP: 17 — AB (ref 5–15)
AST: 16 U/L (ref 0–37)
Albumin: 4.1 g/dL (ref 3.5–5.2)
BILIRUBIN TOTAL: 1.5 mg/dL — AB (ref 0.3–1.2)
BUN: 15 mg/dL (ref 6–23)
CHLORIDE: 100 meq/L (ref 96–112)
CO2: 21 meq/L (ref 19–32)
Calcium: 9 mg/dL (ref 8.4–10.5)
Creatinine, Ser: 1.29 mg/dL (ref 0.50–1.35)
GFR, EST AFRICAN AMERICAN: 85 mL/min — AB (ref >90)
GFR, EST NON AFRICAN AMERICAN: 73 mL/min — AB (ref >90)
GLUCOSE: 136 mg/dL — AB (ref 70–99)
POTASSIUM: 3.5 meq/L — AB (ref 3.7–5.3)
Sodium: 138 mEq/L (ref 137–147)
TOTAL PROTEIN: 9 g/dL — AB (ref 6.0–8.3)

## 2013-09-28 LAB — URINE MICROSCOPIC-ADD ON

## 2013-09-28 LAB — RAPID STREP SCREEN (MED CTR MEBANE ONLY): Streptococcus, Group A Screen (Direct): NEGATIVE

## 2013-09-28 LAB — CBG MONITORING, ED: Glucose-Capillary: 158 mg/dL — ABNORMAL HIGH (ref 70–99)

## 2013-09-28 LAB — I-STAT CG4 LACTIC ACID, ED: Lactic Acid, Venous: 1.56 mmol/L (ref 0.5–2.2)

## 2013-09-28 MED ORDER — KETOROLAC TROMETHAMINE 30 MG/ML IJ SOLN
30.0000 mg | Freq: Once | INTRAMUSCULAR | Status: AC
  Administered 2013-09-28: 30 mg via INTRAVENOUS
  Filled 2013-09-28: qty 1

## 2013-09-28 MED ORDER — SODIUM CHLORIDE 0.9 % IV BOLUS (SEPSIS)
1000.0000 mL | Freq: Once | INTRAVENOUS | Status: AC
  Administered 2013-09-28: 1000 mL via INTRAVENOUS

## 2013-09-28 MED ORDER — DOXYCYCLINE HYCLATE 100 MG PO TABS
100.0000 mg | ORAL_TABLET | Freq: Once | ORAL | Status: AC
  Administered 2013-09-28: 100 mg via ORAL
  Filled 2013-09-28: qty 1

## 2013-09-28 MED ORDER — ACETAMINOPHEN 325 MG PO TABS
650.0000 mg | ORAL_TABLET | Freq: Four times a day (QID) | ORAL | Status: DC | PRN
  Administered 2013-09-28 (×2): 650 mg via ORAL
  Filled 2013-09-28 (×2): qty 2

## 2013-09-28 NOTE — ED Notes (Signed)
Checked patient blood sugar it was 158 notified RN Sabrina of blood sugar

## 2013-09-28 NOTE — ED Notes (Signed)
ED PA at bedside

## 2013-09-28 NOTE — ED Provider Notes (Signed)
CSN: [id number]     Arrival date & time 09/28/13  1620 History   First MD Initiated Contact with Patient 09/28/13 2057     Chief Complaint  Patient presents with  . Abdominal Pain  . Headache  . Chills  . Sore Throat     (Consider location/radiation/quality/duration/timing/severity/associated sxs/prior Treatment) HPI  33 yo male presents today with complaints of hA, fever, abd pain, fatigue, weakness, sore throat, and cough x 2 days.  Pt states he woke up yesterday morning feeling weak and fatigued. He progressively began feeling worse throughout the day.  HA has been constant since yesterday and points across his forehead when asked where the pain is located.  Harold Clements was 8/10 yesterday and prior to ED.  Pain was brought down to 6/10 with PO Tylenol in ED and is continuing to improve.  Abd pain is described as an intermittent, aching pain that is located on the left side of his abd.  Motrin was tried yesterday and did offer some relief.  Pt does not know of any aggravating factors.  Pt also describes a non-productive cough and sore throat that started yesterday.  Pt endorses weakness, fatigue, fever (not measured at home), chills, sweats, decreased appetite, and intermittent blurred vision.  Pt denies N, V, D, dysuria, dyspnea, chest pain, neck pain/stiffness, and rash.  He spends some time outside for work but this is the only time he spends outside, his work involves Interior and spatial designer.  Pt does not believe he has had a tick bite.  Pt's fianc checked pt for ticks 3 days ago after pt reported feeling somewhat sluggish.  No known sick contacts.  BP is elevated today. Pt states he takes no medication for his BP and does not have a PCP.           Past Medical History  Diagnosis Date  . Hypertension    History reviewed. No pertinent past surgical history. No family history on file. History  Substance Use Topics  . Smoking status: Never Smoker   . Smokeless tobacco: Not on file  . Alcohol Use: No     Review of Systems    Allergies  Zithromax  Home Medications   Prior to Admission medications   Medication Sig Start Date End Date Taking? Authorizing Provider  doxycycline (VIBRAMYCIN) 100 MG capsule Take 1 capsule (100 mg total) by mouth 2 (two) times daily. 09/29/13   Linus Mako, PA-C  HYDROcodone-acetaminophen (NORCO/VICODIN) 5-325 MG per tablet Take 1-2 tablets by mouth every 4 (four) hours as needed. 09/29/13   Malaijah Houchen Marilu Favre, PA-C  ondansetron (ZOFRAN) 4 MG tablet Take 1 tablet (4 mg total) by mouth every 6 (six) hours. 09/29/13   Iniya Matzek Marilu Favre, PA-C   BP 157/92  Pulse 75  Temp(Src) 98.1 F (36.7 C) (Oral)  Resp 13  SpO2 99% Physical Exam  Nursing note and vitals reviewed. Constitutional: He is oriented to person, place, and time. He appears well-developed and well-nourished. No distress.  HENT:  Head: Normocephalic and atraumatic.  Mouth/Throat: Uvula is midline, oropharynx is clear and moist and mucous membranes are normal.  Eyes: Pupils are equal, round, and reactive to light.  Neck: Normal range of motion. Neck supple. No spinous process tenderness and no muscular tenderness present. No Brudzinski's sign and no Kernig's sign noted.  Cardiovascular: Normal rate and regular rhythm.   Pulmonary/Chest: Effort normal. No respiratory distress. He has no decreased breath sounds. He has no wheezes. He has no rhonchi.  Abdominal:  Soft. Bowel sounds are normal. He exhibits no distension. There is no tenderness. There is no CVA tenderness.  Musculoskeletal:  No back pain  Neurological: He is alert and oriented to person, place, and time. He has normal strength. No cranial nerve deficit or sensory deficit. He displays a negative Romberg sign. GCS eye subscore is 4. GCS verbal subscore is 5. GCS motor subscore is 6.  Skin: Skin is warm and dry. No purpura and no rash noted. Rash is not pustular and not urticarial.    ED Course  Procedures (including critical care  time) Labs Review Labs Reviewed  COMPREHENSIVE METABOLIC PANEL - Abnormal; Notable for the following:    Potassium 3.5 (*)    Glucose, Bld 136 (*)    Total Protein 9.0 (*)    Total Bilirubin 1.5 (*)    GFR calc non Af Amer 73 (*)    GFR calc Af Amer 85 (*)    Anion gap 17 (*)    All other components within normal limits  URINALYSIS, ROUTINE W REFLEX MICROSCOPIC - Abnormal; Notable for the following:    Color, Urine AMBER (*)    Specific Gravity, Urine 1.034 (*)    Glucose, UA 100 (*)    Bilirubin Urine SMALL (*)    Ketones, ur 15 (*)    Protein, ur >300 (*)    All other components within normal limits  URINE MICROSCOPIC-ADD ON - Abnormal; Notable for the following:    Squamous Epithelial / LPF FEW (*)    Bacteria, UA FEW (*)    All other components within normal limits  CBG MONITORING, ED - Abnormal; Notable for the following:    Glucose-Capillary 158 (*)    All other components within normal limits  RAPID STREP SCREEN  CULTURE, GROUP A STREP  CBC WITH DIFFERENTIAL  B. BURGDORFI ANTIBODIES  ROCKY MTN SPOTTED FVR AB, IGG-BLOOD  ROCKY MTN SPOTTED FVR AB, IGM-BLOOD  I-STAT CG4 LACTIC ACID, ED    Imaging Review No results found.   EKG Interpretation None      MDM   Final diagnoses:  Fever, unspecified fever cause  Viral syndrome    Patient with fever, exposure to ticks and no other apparent source of infection. He has symptoms that hit multiple body systems. His headache is mild and bitermporal. NO neck pain, negative brudzinski and kernig's.He does not appear toxic. His pain resolves with pain medications and his fever resolves with Toradol. Highly doubt meningitis.   CBG and blood pressure elevated, he is on a waiting list for a PCP to address these issues- we had a [place] talk about the longer term consequences of not controlling BP and glucose. Rapid strep negative. Tick born illness titers sent out. I discussed case with Dr.James. Although we did not find a tick or  area of bite, due to exposure we will treat patient for this and f/u on the titers. I discussed plan with pt and his mother and he has been given strict return to ED precautions.  33 y.o.Harold Clements's evaluation in the Emergency Department is complete. It has been determined that no acute conditions requiring further emergency intervention are present at this time. The patient/guardian have been advised of the diagnosis and plan. We have discussed signs and symptoms that warrant return to the ED, such as changes or worsening in symptoms.  Vital signs are stable at discharge. Filed Vitals:   09/29/13 0147  BP:   Pulse:   Temp: 98.1 F (36.7  C)  Resp:     Patient/guardian has voiced understanding and agreed to follow-up with the PCP or specialist.    Linus Mako, PA-C 09/29/13 1747

## 2013-09-28 NOTE — ED Notes (Signed)
Pt is here with headache, eyes hurt, sore throat, blurred vision, cold chills, denies neck stiffness.  Blurred vision started last nite.

## 2013-09-28 NOTE — ED Notes (Signed)
Attempted IV access x2.  ?

## 2013-09-29 MED ORDER — ONDANSETRON HCL 4 MG PO TABS: 4.0000 mg | ORAL_TABLET | Freq: Four times a day (QID) | ORAL | Status: DC

## 2013-09-29 MED ORDER — DOXYCYCLINE HYCLATE 100 MG PO CAPS: 100.0000 mg | ORAL_CAPSULE | Freq: Two times a day (BID) | ORAL | Status: DC

## 2013-09-29 MED ORDER — HYDROCODONE-ACETAMINOPHEN 5-325 MG PO TABS: 1.0000 | ORAL_TABLET | ORAL | Status: DC | PRN

## 2013-09-29 MED ORDER — ACETAMINOPHEN 325 MG PO TABS: 650.0000 mg | ORAL_TABLET | Freq: Once | ORAL | Status: DC

## 2013-09-29 MED ORDER — HYDROMORPHONE HCL PF 1 MG/ML IJ SOLN
1.0000 mg | Freq: Once | INTRAMUSCULAR | Status: AC
  Administered 2013-09-29: 1 mg via INTRAVENOUS
  Filled 2013-09-29: qty 1

## 2013-09-29 NOTE — ED Notes (Signed)
This RN went in room at reassess pain- patient is asleep at this time. Respirations even and unlabored. No acute distress noted.

## 2013-09-29 NOTE — Discharge Instructions (Signed)
Fever, Adult °A fever is a higher than normal body temperature. In an adult, an oral temperature around 98.6° F (37° C) is considered normal. A temperature of 100.4° F (38° C) or higher is generally considered a fever. Mild or moderate fevers generally have no [place]-term effects and often do not require treatment. Extreme fever (greater than or equal to 106° F or 41.1° C) can cause seizures. The sweating that may occur with repeated or prolonged fever may cause dehydration. Elderly people can develop confusion during a fever. °A measured temperature can vary with: °· Age. °· Time of day. °· Method of measurement (mouth, underarm, rectal, or ear). °The fever is confirmed by taking a temperature with a thermometer. Temperatures can be taken different ways. Some methods are accurate and some are not. °· An oral temperature is used most commonly. Electronic thermometers are fast and accurate. °· An ear temperature will only be accurate if the thermometer is positioned as recommended by the manufacturer. °· A rectal temperature is accurate and done for those adults who have a condition where an oral temperature cannot be taken. °· An underarm (axillary) temperature is not accurate and not recommended. °Fever is a symptom, not a disease.  °CAUSES  °· Infections commonly cause fever. °· Some noninfectious causes for fever include: °¨ Some arthritis conditions. °¨ Some thyroid or adrenal gland conditions. °¨ Some immune system conditions. °¨ Some types of cancer. °¨ A medicine reaction. °¨ High doses of certain street drugs such as methamphetamine. °¨ Dehydration. °¨ Exposure to high outside or room temperatures. °· Occasionally, the source of a fever cannot be determined. This is sometimes called a "fever of unknown origin" (FUO). °· Some situations may lead to a temporary rise in body temperature that may go away on its own. Examples are: °¨ Childbirth. °¨ Surgery. °¨ Intense exercise. °HOME CARE INSTRUCTIONS  °· Take  appropriate medicines for fever. Follow dosing instructions carefully. If you use acetaminophen to reduce the fever, be careful to avoid taking other medicines that also contain acetaminophen. Do not take aspirin for a fever if you are younger than age 19. There is an association with Reye's syndrome. Reye's syndrome is a rare but potentially deadly disease. °· If an infection is present and antibiotics have been prescribed, take them as directed. Finish them even if you start to feel better. °· Rest as needed. °· Maintain an adequate fluid intake. To prevent dehydration during an illness with prolonged or recurrent fever, you may need to drink extra fluid. Drink enough fluids to keep your urine clear or pale yellow. °· Sponging or bathing with room temperature water may help reduce body temperature. Do not use ice water or alcohol sponge baths. °· Dress comfortably, but do not over-bundle. °SEEK MEDICAL CARE IF:  °· You are unable to keep fluids down. °· You develop vomiting or diarrhea. °· You are not feeling at least partly better after 3 days. °· You develop new symptoms or problems. °SEEK IMMEDIATE MEDICAL CARE IF:  °· You have shortness of breath or trouble breathing. °· You develop excessive weakness. °· You are dizzy or you faint. °· You are extremely thirsty or you are making little or no urine. °· You develop new pain that was not there before (such as in the head, neck, chest, back, or abdomen). °· You have persistent vomiting and diarrhea for more than 1 to 2 days. °· You develop a stiff neck or your eyes become sensitive to light. °· You develop a   skin rash.  You have a fever or persistent symptoms for more than 2 to 3 days.  You have a fever and your symptoms suddenly get worse. MAKE SURE YOU:   Understand these instructions.  Will watch your condition.  Will get help right away if you are not doing well or get worse. Document Released: 08/12/2000 Document Revised: 07/03/2013 Document  Reviewed: 12/18/2010 [facility] Patient Information 2015 [place], [place]. This information is not intended to replace advice given to you by your health care provider. Make sure you discuss any questions you have with your health care provider.  Viral Infections A viral infection can be caused by different types of viruses.Most viral infections are not serious and resolve on their own. However, some infections may cause severe symptoms and may lead to further complications. SYMPTOMS Viruses can frequently cause:  Minor sore throat.  Aches and pains.  Headaches.  Runny nose.  Different types of rashes.  Watery eyes.  Tiredness.  Cough.  Loss of appetite.  Gastrointestinal infections, resulting in nausea, vomiting, and diarrhea. These symptoms do not respond to antibiotics because the infection is not caused by bacteria. However, you might catch a bacterial infection following the viral infection. This is sometimes called a "superinfection." Symptoms of such a bacterial infection may include:  Worsening sore throat with pus and difficulty swallowing.  Swollen neck glands.  Chills and a high or persistent fever.  Severe headache.  Tenderness over the sinuses.  Persistent overall ill feeling (malaise), muscle aches, and tiredness (fatigue).  Persistent cough.  Yellow, green, or brown mucus production with coughing. HOME CARE INSTRUCTIONS   Only take over-the-counter or prescription medicines for pain, discomfort, diarrhea, or fever as directed by your caregiver.  Drink enough water and fluids to keep your urine clear or pale yellow. Sports drinks can provide valuable electrolytes, sugars, and hydration.  Get plenty of rest and maintain proper nutrition. Soups and broths with crackers or rice are fine. SEEK IMMEDIATE MEDICAL CARE IF:   You have severe headaches, shortness of breath, chest pain, neck pain, or an unusual rash.  You have uncontrolled vomiting, diarrhea,  or you are unable to keep down fluids.  You or your child has an oral temperature above 102 F (38.9 C), not controlled by medicine.  Your baby is older than 3 months with a rectal temperature of 102 F (38.9 C) or higher.  Your baby is 101 months old or younger with a rectal temperature of 100.4 F (38 C) or higher. MAKE SURE YOU:   Understand these instructions.  Will watch your condition.  Will get help right away if you are not doing well or get worse. Document Released: 11/26/2004 Document Revised: 05/11/2011 Document Reviewed: 06/23/2010 [facility] Patient Information 2015 [place], [place]. This information is not intended to replace advice given to you by your health care provider. Make sure you discuss any questions you have with your health care provider.

## 2013-09-30 LAB — CULTURE, GROUP A STREP

## 2013-10-02 LAB — B. BURGDORFI ANTIBODIES: B burgdorferi Ab IgG+IgM: 0.22 {ISR}

## 2013-10-03 LAB — ROCKY MTN SPOTTED FVR AB, IGG-BLOOD: RMSF IGG: 1.21 IV — AB

## 2013-10-03 LAB — ROCKY MTN SPOTTED FVR AB, IGM-BLOOD: RMSF IgM: 0.05 IV (ref 0.00–0.89)

## 2013-10-03 NOTE — ED Provider Notes (Signed)
Medical screening examination/treatment/procedure(s) were performed by non-physician practitioner and as supervising physician I was immediately available for consultation/collaboration.   EKG Interpretation None        Tanna Furry, MD 10/03/13 2348

## 2013-10-04 ENCOUNTER — Telehealth (HOSPITAL_COMMUNITY): Payer: Self-pay

## 2013-10-05 ENCOUNTER — Telehealth (HOSPITAL_BASED_OUTPATIENT_CLINIC_OR_DEPARTMENT_OTHER): Payer: Self-pay | Admitting: Emergency Medicine

## 2013-12-27 ENCOUNTER — Encounter (HOSPITAL_COMMUNITY): Payer: Self-pay | Admitting: Emergency Medicine

## 2013-12-27 ENCOUNTER — Emergency Department (INDEPENDENT_AMBULATORY_CARE_PROVIDER_SITE_OTHER)
Admission: EM | Admit: 2013-12-27 | Discharge: 2013-12-27 | Disposition: A | Discharge status: Home / Self Care | Payer: BC Managed Care – PPO | Source: Home / Self Care | Attending: Emergency Medicine | Admitting: Emergency Medicine

## 2013-12-27 DIAGNOSIS — N183 Chronic kidney disease, stage 3 unspecified: Secondary | ICD-10-CM

## 2013-12-27 DIAGNOSIS — J020 Streptococcal pharyngitis: Secondary | ICD-10-CM

## 2013-12-27 DIAGNOSIS — E1122 Type 2 diabetes mellitus with diabetic chronic kidney disease: Secondary | ICD-10-CM

## 2013-12-27 DIAGNOSIS — N189 Chronic kidney disease, unspecified: Secondary | ICD-10-CM

## 2013-12-27 DIAGNOSIS — I10 Essential (primary) hypertension: Secondary | ICD-10-CM

## 2013-12-27 LAB — POCT I-STAT, CHEM 8
BUN: 17 mg/dL (ref 6–23)
Calcium, Ion: 1.18 mmol/L (ref 1.12–1.23)
Chloride: 102 mEq/L (ref 96–112)
Creatinine, Ser: 1.6 mg/dL — ABNORMAL HIGH (ref 0.50–1.35)
Glucose, Bld: 220 mg/dL — ABNORMAL HIGH (ref 70–99)
HCT: 45 % (ref 39.0–52.0)
HEMOGLOBIN: 15.3 g/dL (ref 13.0–17.0)
POTASSIUM: 3.4 meq/L — AB (ref 3.7–5.3)
Sodium: 139 mEq/L (ref 137–147)
TCO2: 26 mmol/L (ref 0–100)

## 2013-12-27 LAB — POCT RAPID STREP A: STREPTOCOCCUS, GROUP A SCREEN (DIRECT): POSITIVE — AB

## 2013-12-27 MED ORDER — HYDROCHLOROTHIAZIDE 25 MG PO TABS: 25.0000 mg | ORAL_TABLET | Freq: Every day | ORAL | Status: DC

## 2013-12-27 MED ORDER — PENICILLIN G BENZATHINE 1200000 UNIT/2ML IM SUSP
INTRAMUSCULAR | Status: AC
  Filled 2013-12-27: qty 2

## 2013-12-27 MED ORDER — PENICILLIN G BENZATHINE 1200000 UNIT/2ML IM SUSP
1.2000 10*6.[IU] | Freq: Once | INTRAMUSCULAR | Status: AC
  Administered 2013-12-27: 1.2 10*6.[IU] via INTRAMUSCULAR

## 2013-12-27 MED ORDER — AMLODIPINE BESYLATE 5 MG PO TABS: 5.0000 mg | ORAL_TABLET | Freq: Every day | ORAL | Status: DC

## 2013-12-27 MED ORDER — GLIMEPIRIDE 1 MG PO TABS: 1.0000 mg | ORAL_TABLET | Freq: Every day | ORAL | Status: DC

## 2013-12-27 NOTE — Discharge Instructions (Signed)
Strep Throat °Strep throat is an infection of the throat caused by a bacteria named Streptococcus pyogenes. Your health care provider may call the infection streptococcal "tonsillitis" or "pharyngitis" depending on whether there are signs of inflammation in the tonsils or back of the throat. Strep throat is most common in children aged 31-15 years during the cold months of the year, but it can occur in people of any age during any season. This infection is spread from person to person (contagious) through coughing, sneezing, or other close contact. °SIGNS AND SYMPTOMS  °· Fever or chills. °· Painful, swollen, red tonsils or throat. °· Pain or difficulty when swallowing. °· White or yellow spots on the tonsils or throat. °· Swollen, tender lymph nodes or "glands" of the neck or under the jaw. °· Red rash all over the body (rare). °DIAGNOSIS  °Many different infections can cause the same symptoms. A test must be done to confirm the diagnosis so the right treatment can be given. A "rapid strep test" can help your health care provider make the diagnosis in a few minutes. If this test is not available, a light swab of the infected area can be used for a throat culture test. If a throat culture test is done, results are usually available in a day or two. °TREATMENT  °Strep throat is treated with antibiotic medicine. °HOME CARE INSTRUCTIONS  °· Gargle with 1 tsp of salt in 1 cup of warm water, 3-4 times per day or as needed for comfort. °· Family members who also have a sore throat or fever should be tested for strep throat and treated with antibiotics if they have the strep infection. °· Make sure everyone in your household washes their hands well. °· Do not share food, drinking cups, or personal items that could cause the infection to spread to others. °· You may need to eat a soft food diet until your sore throat gets better. °· Drink enough water and fluids to keep your urine clear or pale yellow. This will help prevent  dehydration. °· Get plenty of rest. °· Stay home from school, day care, or work until you have been on antibiotics for 24 hours. °· Take medicines only as directed by your health care provider. °· Take your antibiotic medicine as directed by your health care provider. Finish it even if you start to feel better. °SEEK MEDICAL CARE IF:  °· The glands in your neck continue to enlarge. °· You develop a rash, cough, or earache. °· You cough up green, yellow-brown, or bloody sputum. °· You have pain or discomfort not controlled by medicines. °· Your problems seem to be getting worse rather than better. °· You have a fever. °SEEK IMMEDIATE MEDICAL CARE IF:  °· You develop any new symptoms such as vomiting, severe headache, stiff or painful neck, chest pain, shortness of breath, or trouble swallowing. °· You develop severe throat pain, drooling, or changes in your voice. °· You develop swelling of the neck, or the skin on the neck becomes red and tender. °· You develop signs of dehydration, such as fatigue, dry mouth, and decreased urination. °· You become increasingly sleepy, or you cannot wake up completely. °MAKE SURE YOU: °· Understand these instructions. °· Will watch your condition. °· Will get help right away if you are not doing well or get worse. °Document Released: 02/14/2000 Document Revised: 07/03/2013 Document Reviewed: 04/17/2010 °[employer]® Patient Information ©2015 [employer]. This information is not intended to replace advice given to you by   your health care provider. Make sure you discuss any questions you have with your health care provider.  Blood pressure over the ideal can put you at higher risk for stroke, heart disease, and kidney failure.  For this reason, it's important to try to get your blood pressure as close as possible to the ideal.  The ideal blood pressure is 120/80.  Blood pressures from 123456 systolic over [id number] diastolic are labeled as "prehypertension."  This means you are at  higher risk of developing hypertension in the future.  Blood pressures in this range are not treated with medication, but lifestyle changes are recommended to prevent progression to hypertension.  Blood pressures of [id number] and above systolic over 90 and above diastolic are classified as hypertension and are treated with medications.  Lifestyle changes which can benefit both prehypertension and hypertension include the following:   Salt and sodium restriction.  Weight loss.  Regular exercise.  Avoidance of tobacco.  Avoidance of excess alcohol.  The "D.A.S.H" diet.   People with hypertension and prehypertension should limit their salt intake to less than 1500 mg daily.  Reading the nutrition information on the label of many prepared foods can give you an idea of how much sodium you're consuming at each meal.  Remember that the most important number on the nutrition information is the serving size.  It may be smaller than you think.  Try to avoid adding extra salt at the table.  You may add small amounts of salt while cooking.  Remember that salt is an acquired taste and you may get used to a using a whole lot less salt than you are using now.  Using less salt lets the food's natural flavors come through.  You might want to consider using salt substitutes, potassium chloride, pepper, or blends of herbs and spices to enhance the flavor of your food.  Foods that contain the most salt include: processed meats (like ham, bacon, lunch meat, sausage, hot dogs, and breakfast meat), chips, pretzels, salted nuts, soups, salty snacks, canned foods, junk food, fast food, restaurant food, mustard, pickles, pizza, popcorn, soy sauce, and worcestershire sauce--quite a list!  You might ask, "Is there anything I can eat?"  The answer is, "yes."  Fruits and vegetables are usually low in salt.  Fresh is better than frozen which is better than canned.  If you have canned vegetables, you can cut down on the salt content by  rinsing them in tap water 3 times before cooking.     Weight loss is the second thing you can do to lower your blood pressure.  Getting to and maintaining ideal weight will often normalize your blood pressure and allow you to avoid medications, entirely, cut way down on your dosage of medications, or allow to wean off your meds.  (Note, this should only be done under the supervision of your primary care doctor.)  Of course, weight loss takes time and you may need to be on medication in the meantime.  You shoot for a body mass index of 20-25.  When you go to the urgent care or to your primary care doctor, they should calculate your BMI.  If you don't know what it is, ask.  You can calculate your BMI with the following formula:  Weight in pounds x 703/ (height in inches) x (height in inches).  There are many good diets out there: Weight Watchers and the D.A.S.H. Diet are the best, but often, just modifying a few factors can  be helpful:  Don't skip meals, don't eat out, and keeping a food diary.  I do not recommend fad diets or diet pills which often raise blood pressure.    Everyone should get regular exercise, but this is particularly important for people with high blood pressure.  Just about any exercise is good.  The only exercise which may be harmful is lifting extreme heavy weights.  I recommend moderate exercise such as walking for 30 minutes 5 days a week.  Going to the gym for a 50 minute workout 3 times a week is also good.  This amounts to 150 minutes of exercise weekly.   Anyone with high blood pressure should avoid any use of tobacco.  Tobacco use does not elevate blood pressure, but it increases the risk of heart disease and stroke.  If you are interested in quitting, discuss with your doctor how to quit.  If you are not interested in quitting, ask yourself, "What would my life be like in 10 years if I continue to smoke?"  "How will I know when it is time to quit?"  "How would my life be better  if I were to quit."   Excess alcohol intake can raise the blood pressure.  The safe alcohol intake is 2 drinks or less per day for men and 1 drink per day or less for women.   There is a very good diet which I recommend that has been designed for people with blood pressure called the D.A.S.H. Diet (dietary approaches to stop hypertension).  It consists of fruits, vegetables, lean meats, low fat dairy, whole grains, nuts and seeds.  It is very low in salt and sodium.  It has also been found to have other beneficial health effects such as lowering cholesterol and helping lose weight.  It has been developed by the [employer] and can be downloaded from the internet without any cost. Just do a Development worker, community on "D.A.S.H. Diet." or go the NIH website (MasterBoxes.it).  There are also cookbooks and diet plans that can be gotten from [place] to help you with this diet.   Type 2 Diabetes Mellitus Type 2 diabetes mellitus, often simply referred to as type 2 diabetes, is a Milburn-lasting (chronic) disease. In type 2 diabetes, the pancreas does not make enough insulin (a hormone), the cells are less responsive to the insulin that is made (insulin resistance), or both. Normally, insulin moves sugars from food into the tissue cells. The tissue cells use the sugars for energy. The lack of insulin or the lack of normal response to insulin causes excess sugars to build up in the blood instead of going into the tissue cells. As a result, high blood sugar (hyperglycemia) develops. The effect of high sugar (glucose) levels can cause many complications. Type 2 diabetes was also previously called adult-onset diabetes, but it can occur at any age.  RISK FACTORS  A person is predisposed to developing type 2 diabetes if someone in the family has the disease and also has one or more of the following primary risk factors:  Overweight.  An inactive lifestyle.  A history of consistently eating high-calorie  foods. Maintaining a normal weight and regular physical activity can reduce the chance of developing type 2 diabetes. SYMPTOMS  A person with type 2 diabetes may not show symptoms initially. The symptoms of type 2 diabetes appear slowly. The symptoms include:  Increased thirst (polydipsia).  Increased urination (polyuria).  Increased urination during the night (nocturia).  Weight loss. This weight loss may be rapid.  Frequent, recurring infections.  Tiredness (fatigue).  Weakness.  Vision changes, such as blurred vision.  Fruity smell to your breath.  Abdominal pain.  Nausea or vomiting.  Cuts or bruises which are slow to heal.  Tingling or numbness in the hands or feet. DIAGNOSIS Type 2 diabetes is frequently not diagnosed until complications of diabetes are present. Type 2 diabetes is diagnosed when symptoms or complications are present and when blood glucose levels are increased. Your blood glucose level may be checked by one or more of the following blood tests:  A fasting blood glucose test. You will not be allowed to eat for at least 8 hours before a blood sample is taken.  A random blood glucose test. Your blood glucose is checked at any time of the day regardless of when you ate.  A hemoglobin A1c blood glucose test. A hemoglobin A1c test provides information about blood glucose control over the previous 3 months.  An oral glucose tolerance test (OGTT). Your blood glucose is measured after you have not eaten (fasted) for 2 hours and then after you drink a glucose-containing beverage. TREATMENT   You may need to take insulin or diabetes medicine daily to keep blood glucose levels in the desired range.  If you use insulin, you may need to adjust the dosage depending on the carbohydrates that you eat with each meal or snack. The treatment goal is to maintain the before meal blood sugar (preprandial glucose) level at 70-130 mg/dL. HOME CARE INSTRUCTIONS   Have your  hemoglobin A1c level checked twice a year.  Perform daily blood glucose monitoring as directed by your health care provider.  Monitor urine ketones when you are ill and as directed by your health care provider.  Take your diabetes medicine or insulin as directed by your health care provider to maintain your blood glucose levels in the desired range.  Never run out of diabetes medicine or insulin. It is needed every day.  If you are using insulin, you may need to adjust the amount of insulin given based on your intake of carbohydrates. Carbohydrates can raise blood glucose levels but need to be included in your diet. Carbohydrates provide vitamins, minerals, and fiber which are an essential part of a healthy diet. Carbohydrates are found in fruits, vegetables, whole grains, dairy products, legumes, and foods containing added sugars.  Eat healthy foods. You should make an appointment to see a registered dietitian to help you create an eating plan that is right for you.  Lose weight if you are overweight.  Carry a medical alert card or wear your medical alert jewelry.  Carry a 15-gram carbohydrate snack with you at all times to treat low blood glucose (hypoglycemia). Some examples of 15-gram carbohydrate snacks include:  Glucose tablets, 3 or 4.  Glucose gel, 15-gram tube.  Raisins, 2 tablespoons (24 grams).  Jelly beans, 6.  Animal crackers, 8.  Regular pop, 4 ounces (120 mL).  Gummy treats, 9.  Recognize hypoglycemia. Hypoglycemia occurs with blood glucose levels of 70 mg/dL and below. The risk for hypoglycemia increases when fasting or skipping meals, during or after intense exercise, and during sleep. Hypoglycemia symptoms can include:  Tremors or shakes.  Decreased ability to concentrate.  Sweating.  Increased heart rate.  Headache.  Dry mouth.  Hunger.  Irritability.  Anxiety.  Restless sleep.  Altered speech or coordination.  Confusion.  Treat  hypoglycemia promptly. If you are alert and able  to safely swallow, follow the 15:15 rule:  Take 15-20 grams of rapid-acting glucose or carbohydrate. Rapid-acting options include glucose gel, glucose tablets, or 4 ounces (120 mL) of fruit juice, regular soda, or low-fat milk.  Check your blood glucose level 15 minutes after taking the glucose.  Take 15-20 grams more of glucose if the repeat blood glucose level is still 70 mg/dL or below.  Eat a meal or snack within 1 hour once blood glucose levels return to normal.  Be alert to feeling very thirsty and urinating more frequently than usual, which are early signs of hyperglycemia. An early awareness of hyperglycemia allows for prompt treatment. Treat hyperglycemia as directed by your health care provider.  Engage in at least 150 minutes of moderate-intensity physical activity a week, spread over at least 3 days of the week or as directed by your health care provider. In addition, you should engage in resistance exercise at least 2 times a week or as directed by your health care provider. Try to spend no more than 90 minutes at one time inactive.  Adjust your medicine and food intake as needed if you start a new exercise or sport.  Follow your sick-day plan anytime you are unable to eat or drink as usual.  Do not use any tobacco products including cigarettes, chewing tobacco, or electronic cigarettes. If you need help quitting, ask your health care provider.  Limit alcohol intake to no more than 1 drink per day for nonpregnant women and 2 drinks per day for men. You should drink alcohol only when you are also eating food. Talk with your health care provider whether alcohol is safe for you. Tell your health care provider if you drink alcohol several times a week.  Keep all follow-up visits as directed by your health care provider. This is important.  Schedule an eye exam soon after the diagnosis of type 2 diabetes and then annually.  Perform  daily skin and foot care. Examine your skin and feet daily for cuts, bruises, redness, nail problems, bleeding, blisters, or sores. A foot exam by a health care provider should be done annually.  Brush your teeth and gums at least twice a day and floss at least once a day. Follow up with your dentist regularly.  Share your diabetes management plan with your workplace or school.  Stay up-to-date with immunizations. It is recommended that people with diabetes who are over 34 years old get the pneumonia vaccine. In some cases, two separate shots may be given. Ask your health care provider if your pneumonia vaccination is up-to-date.  Learn to manage stress.  Obtain ongoing diabetes education and support as needed.  Participate in or seek rehabilitation as needed to maintain or improve independence and quality of life. Request a physical or occupational therapy referral if you are having foot or hand numbness, or difficulties with grooming, dressing, eating, or physical activity. SEEK MEDICAL CARE IF:   You are unable to eat food or drink fluids for more than 6 hours.  You have nausea and vomiting for more than 6 hours.  Your blood glucose level is over 240 mg/dL.  There is a change in mental status.  You develop an additional serious illness.  You have diarrhea for more than 6 hours.  You have been sick or have had a fever for a couple of days and are not getting better.  You have pain during any physical activity.  SEEK IMMEDIATE MEDICAL CARE IF:  You have difficulty breathing.  You have moderate to large ketone levels. MAKE SURE YOU:  Understand these instructions.  Will watch your condition.  Will get help right away if you are not doing well or get worse. Document Released: 02/16/2005 Document Revised: 07/03/2013 Document Reviewed: 09/15/2011 [facility] Patient Information 2015 [place], [place]. This information is not intended to replace advice given to you by your health  care provider. Make sure you discuss any questions you have with your health care provider.

## 2013-12-27 NOTE — ED Notes (Signed)
C/o ST onset Tuesday Sx include: odynophagia, HA, congestion, runny nose, SOB Denise fevers, chills BP today is 195/136 Denies CP, weakness, diaphoresis, nauseas Alert, no signs of acute distress.

## 2013-12-27 NOTE — ED Provider Notes (Signed)
Chief Complaint   Sore Throat   History of Present Illness   Harold Clements is a 33 year old male who has had a two-day history of sore throat, pain on swallowing, fever, chills, nasal congestion, headache, and swollen glands. He denies any coughing or GI symptoms. He's had no definite sick exposures. He has a history of tonsillitis in the past.  He also has a history of high blood pressure. This was first detected about a year ago. He was prescribed medication for his blood pressure, but he never did follow-up after he finished up the medication, so he is not on any medication right now. He was also told he had "borderline diabetes." He's not taking any medication for that right now.   Review of Systems   Other than as noted above, the patient denies any of the following symptoms. Systemic:  No fever, chills, sweats, myalgias, or headache. Eye:  No redness, pain or drainage. ENT:  No earache, nasal congestion, sneezing, rhinorrhea, sinus pressure, sinus pain, or post nasal drip. Lungs:  No cough, sputum production, wheezing, shortness of breath, or chest pain. GI:  No abdominal pain, nausea, vomiting, or diarrhea. Skin:  No rash.  Edith Endave   Past medical history, family history, social history, meds, and allergies were reviewed. He is allergic to azithromycin.  Physical Exam     Vital signs:  BP 213/128  Pulse 115  Temp(Src) 100.7 F (38.2 C) (Oral)  Resp 18  SpO2 98% General:  Alert, in no distress. He has a "hot potato voice", no drooling, and patient was able to handle secretions well.  Eye:  No conjunctival injection or drainage. Lids were normal. ENT:  TMs and canals were normal, without erythema or inflammation.  Nasal mucosa was clear and uncongested, without drainage.  Mucous membranes were moist.  Exam of pharynx shows markedly swelling, tonsillar enlargement, erythema, and exudate.  There were no oral ulcerations or lesions. There was no bulging of the tonsillar pillars,  and the uvula was midline. Neck:  Supple, no adenopathy, tenderness or mass. Lungs:  No respiratory distress.  Lungs were clear to auscultation, without wheezes, rales or rhonchi.  Breath sounds were clear and equal bilaterally.  Heart:  Regular rhythm, without gallops, murmers or rubs. Skin:  Clear, warm, and dry, without rash or lesions.  Labs   Results for orders placed during the hospital encounter of 12/27/13  POCT RAPID STREP A ([facility]RG CARE ONLY)      Result Value Ref Range   Streptococcus, Group A Screen (Direct) POSITIVE (*) NEGATIVE  POCT I-STAT, CHEM 8      Result Value Ref Range   Sodium 139  137 - 147 mEq/L   Potassium 3.4 (*) 3.7 - 5.3 mEq/L   Chloride 102  96 - 112 mEq/L   BUN 17  6 - 23 mg/dL   Creatinine, Ser 1.60 (*) 0.50 - 1.35 mg/dL   Glucose, Bld 220 (*) 70 - 99 mg/dL   Calcium, Ion 1.18  1.12 - 1.23 mmol/L   TCO2 26  0 - 100 mmol/L   Hemoglobin 15.3  13.0 - 17.0 g/dL   HCT 45.0  39.0 - 52.0 %    Course in Urgent [place]   The patient was given the following meds: Medications  penicillin g benzathine (BICILLIN LA) 1200000 UNIT/2ML injection 1.2 Million Units (not administered)   Assessment   The primary encounter diagnosis was Strep throat. Diagnoses of Essential hypertension, Type 2 diabetes mellitus with diabetic  chronic kidney disease, and Chronic kidney disease (CKD), stage III (moderate) were also pertinent to this visit.  There is no evidence of a peritonsillar abscess, retropharyngeal abscess, or epiglottitis.    The patient has marked elevation of his blood pressure and a mild elevation of his blood sugar. I had a Skiff talk with him about the importance of taking his medicine for high blood pressure and diabetes and following up with a primary care physician as soon as possible.  Plan     1.  Meds:  The following meds were prescribed:   New Prescriptions   AMLODIPINE (NORVASC) 5 MG TABLET    Take 1 tablet (5 mg total) by mouth daily.    GLIMEPIRIDE (AMARYL) 1 MG TABLET    Take 1 tablet (1 mg total) by mouth daily with breakfast.   HYDROCHLOROTHIAZIDE (HYDRODIURIL) 25 MG TABLET    Take 1 tablet (25 mg total) by mouth daily.    2.  Patient Education/Counseling:  The patient was given appropriate handouts, self care instructions, and instructed in symptomatic relief, including hot saline gargles, throat lozenges, infectious precautions, and need to trade out toothbrush.    3.  Follow up:  The patient was told to follow up here if no better in 3 to 4 days, or sooner if becoming worse in any way, and given some red flag symptoms such as difficulty swallowing or breathing which would prompt immediate return.  Follow-up with [facility] for hypertension and diabetes as soon as possible.     Harden Mo, MD 12/27/13 1146

## 2014-08-12 ENCOUNTER — Emergency Department (HOSPITAL_COMMUNITY)
Admission: EM | Admit: 2014-08-12 | Discharge: 2014-08-12 | Disposition: A | Discharge status: Home / Self Care | Payer: Self-pay | Attending: Emergency Medicine | Admitting: Emergency Medicine

## 2014-08-12 ENCOUNTER — Encounter (HOSPITAL_COMMUNITY): Payer: Self-pay | Admitting: *Deleted

## 2014-08-12 DIAGNOSIS — I10 Essential (primary) hypertension: Secondary | ICD-10-CM | POA: Insufficient documentation

## 2014-08-12 DIAGNOSIS — Z792 Long term (current) use of antibiotics: Secondary | ICD-10-CM | POA: Insufficient documentation

## 2014-08-12 DIAGNOSIS — E119 Type 2 diabetes mellitus without complications: Secondary | ICD-10-CM | POA: Insufficient documentation

## 2014-08-12 DIAGNOSIS — Y9289 Other specified places as the place of occurrence of the external cause: Secondary | ICD-10-CM | POA: Insufficient documentation

## 2014-08-12 DIAGNOSIS — W57XXXA Bitten or stung by nonvenomous insect and other nonvenomous arthropods, initial encounter: Secondary | ICD-10-CM | POA: Insufficient documentation

## 2014-08-12 DIAGNOSIS — Y998 Other external cause status: Secondary | ICD-10-CM | POA: Insufficient documentation

## 2014-08-12 DIAGNOSIS — Y9389 Activity, other specified: Secondary | ICD-10-CM | POA: Insufficient documentation

## 2014-08-12 DIAGNOSIS — S60461A Insect bite (nonvenomous) of left index finger, initial encounter: Secondary | ICD-10-CM | POA: Insufficient documentation

## 2014-08-12 LAB — I-STAT CHEM 8, ED
BUN: 19 mg/dL (ref 6–20)
CALCIUM ION: 1.21 mmol/L (ref 1.12–1.23)
CHLORIDE: 103 mmol/L (ref 101–111)
Creatinine, Ser: 1.4 mg/dL — ABNORMAL HIGH (ref 0.61–1.24)
Glucose, Bld: 267 mg/dL — ABNORMAL HIGH (ref 65–99)
HCT: 43 % (ref 39.0–52.0)
HEMOGLOBIN: 14.6 g/dL (ref 13.0–17.0)
POTASSIUM: 3.6 mmol/L (ref 3.5–5.1)
SODIUM: 139 mmol/L (ref 135–145)
TCO2: 23 mmol/L (ref 0–100)

## 2014-08-12 LAB — URINALYSIS, ROUTINE W REFLEX MICROSCOPIC
BILIRUBIN URINE: NEGATIVE
Glucose, UA: >1000 mg/dL — AB
HGB URINE DIPSTICK: NEGATIVE
Ketones, ur: NEGATIVE mg/dL
LEUKOCYTES UA: NEGATIVE
Nitrite: NEGATIVE
Protein, ur: 30 mg/dL — AB
SPECIFIC GRAVITY, URINE: 1.034 — AB (ref 1.005–1.030)
UROBILINOGEN UA: 1 mg/dL (ref 0.0–1.0)
pH: 5 (ref 5.0–8.0)

## 2014-08-12 LAB — URINE MICROSCOPIC-ADD ON

## 2014-08-12 MED ORDER — GLIMEPIRIDE 1 MG PO TABS: 1.0000 mg | ORAL_TABLET | Freq: Every day | ORAL | Status: DC

## 2014-08-12 MED ORDER — DIPHENHYDRAMINE HCL 25 MG PO CAPS
50.0000 mg | ORAL_CAPSULE | Freq: Once | ORAL | Status: AC
  Administered 2014-08-12: 50 mg via ORAL
  Filled 2014-08-12: qty 2

## 2014-08-12 MED ORDER — AMLODIPINE BESYLATE 5 MG PO TABS: 10.0000 mg | ORAL_TABLET | Freq: Every day | ORAL | Status: DC

## 2014-08-12 MED ORDER — AMLODIPINE BESYLATE 5 MG PO TABS
5.0000 mg | ORAL_TABLET | Freq: Once | ORAL | Status: AC
  Administered 2014-08-12: 5 mg via ORAL
  Filled 2014-08-12: qty 1

## 2014-08-12 MED ORDER — FAMOTIDINE 20 MG PO TABS
40.0000 mg | ORAL_TABLET | Freq: Once | ORAL | Status: AC
  Administered 2014-08-12: 40 mg via ORAL
  Filled 2014-08-12: qty 2

## 2014-08-12 MED ORDER — HYDROCHLOROTHIAZIDE 12.5 MG PO TABS: 25.0000 mg | ORAL_TABLET | Freq: Every day | ORAL | Status: DC

## 2014-08-12 MED ORDER — HYDROCHLOROTHIAZIDE 25 MG PO TABS
25.0000 mg | ORAL_TABLET | Freq: Every day | ORAL | Status: DC
  Administered 2014-08-12: 25 mg via ORAL
  Filled 2014-08-12: qty 1

## 2014-08-12 NOTE — Discharge Instructions (Signed)
High Blood Sugar High blood sugar (hyperglycemia) means that the level of sugar in your blood is higher than it should be. Signs of high blood sugar include:  Feeling thirsty.  Frequent peeing (urinating).  Feeling tired or sleepy.  Dry mouth.  Vision changes.  Feeling weak.  Feeling hungry but losing weight.  Numbness and tingling in your hands or feet.  Headache. When you ignore these signs, your blood sugar may keep going up. These problems may get worse, and other problems may begin. HOME CARE  Check your blood sugars as told by your doctor. Write down the numbers with the date and time.  Take the right amount of insulin or diabetes pills at the right time. Write down the dose with date and time.  Refill your insulin or diabetes pills before running out.  Watch what you eat. Follow your meal plan.  Drink liquids without sugar, such as water. Check with your doctor if you have kidney or heart disease.  Follow your doctor's orders for exercise. Exercise at the same time of day.  Keep your doctor's appointments. GET HELP RIGHT AWAY IF:   You have trouble thinking or are confused.  You have fast breathing with fruity smelling breath.  You pass out (faint).  You have 2 to 3 days of high blood sugars and you do not know why.  You have chest pain.  You are feeling sick to your stomach (nauseous) or throwing up (vomiting).  You have sudden vision changes. MAKE SURE YOU:   Understand these instructions.  Will watch your condition.  Will get help right away if you are not doing well or get worse. Document Released: 12/14/2008 Document Revised: 05/11/2011 Document Reviewed: 12/14/2008 [facility] Patient Information 2015 [place], [place]. This information is not intended to replace advice given to you by your health care provider. Make sure you discuss any questions you have with your health care provider.  Hypertension Hypertension is another name for high blood  pressure. High blood pressure forces your heart to work harder to pump blood. A blood pressure reading has two numbers, which includes a higher number over a lower number (example: 110/72). HOME CARE   Have your blood pressure rechecked by your doctor.  Only take medicine as told by your doctor. Follow the directions carefully. The medicine does not work as well if you skip doses. Skipping doses also puts you at risk for problems.  Do not smoke.  Monitor your blood pressure at home as told by your doctor. GET HELP IF:  You think you are having a reaction to the medicine you are taking.  You have repeat headaches or feel dizzy.  You have puffiness (swelling) in your ankles.  You have trouble with your vision. GET HELP RIGHT AWAY IF:   You get a very bad headache and are confused.  You feel weak, numb, or faint.  You get chest or belly (abdominal) pain.  You throw up (vomit).  You cannot breathe very well. MAKE SURE YOU:   Understand these instructions.  Will watch your condition.  Will get help right away if you are not doing well or get worse. Document Released: 08/05/2007 Document Revised: 02/21/2013 Document Reviewed: 12/09/2012 [facility] Patient Information 2015 [place], [place]. This information is not intended to replace advice given to you by your health care provider. Make sure you discuss any questions you have with your health care provider.  Insect Bite Mosquitoes, flies, fleas, bedbugs, and many other insects can bite. Insect bites  are different from insect stings. A sting is when venom is injected into the skin. Some insect bites can transmit infectious diseases. SYMPTOMS  Insect bites usually turn red, swell, and itch for 2 to 4 days. They often go away on their own. TREATMENT  Your caregiver may prescribe antibiotic medicines if a bacterial infection develops in the bite. HOME CARE INSTRUCTIONS  Do not scratch the bite area.  Keep the bite area clean and  dry. Wash the bite area thoroughly with soap and water.  Put ice or cool compresses on the bite area.  Put ice in a plastic bag.  Place a towel between your skin and the bag.  Leave the ice on for 20 minutes, 4 times a day for the first 2 to 3 days, or as directed.  You may apply a baking soda paste, cortisone cream, or calamine lotion to the bite area as directed by your caregiver. This can help reduce itching and swelling.  Only take over-the-counter or prescription medicines as directed by your caregiver.  If you are given antibiotics, take them as directed. Finish them even if you start to feel better. You may need a tetanus shot if:  You cannot remember when you had your last tetanus shot.  You have never had a tetanus shot.  The injury broke your skin. If you get a tetanus shot, your arm may swell, get red, and feel warm to the touch. This is common and not a problem. If you need a tetanus shot and you choose not to have one, there is a rare chance of getting tetanus. Sickness from tetanus can be serious. SEEK IMMEDIATE MEDICAL CARE IF:   You have increased pain, redness, or swelling in the bite area.  You see a red line on the skin coming from the bite.  You have a fever.  You have joint pain.  You have a headache or neck pain.  You have unusual weakness.  You have a rash.  You have chest pain or shortness of breath.  You have abdominal pain, nausea, or vomiting.  You feel unusually tired or sleepy. MAKE SURE YOU:   Understand these instructions.  Will watch your condition.  Will get help right away if you are not doing well or get worse. Document Released: 03/26/2004 Document Revised: 05/11/2011 Document Reviewed: 09/17/2010 [facility] Patient Information 2015 [place], [place]. This information is not intended to replace advice given to you by your health care provider. Make sure you discuss any questions you have with your health care provider.

## 2014-08-12 NOTE — ED Notes (Signed)
Pt reports bee sting to LT index finger on Friday 08-10-2014 . Lt index finger swollen.

## 2014-08-12 NOTE — ED Notes (Signed)
Declined W/C at D/C and was escorted to lobby by RN. 

## 2014-08-12 NOTE — ED Provider Notes (Signed)
CSN: [id number]     Arrival date & time 08/12/14  1010 History  This chart was scribed for Delsa Grana, PA-C, working with Nat Christen, MD by Steva Colder, ED Scribe. The patient was seen in room TR07C/TR07C at 10:27 AM.     Chief Complaint  Patient presents with  . Insect Bite   The history is provided by the patient. No language interpreter was used.   HPI Comments: Harold Clements is a 34 y.o. male with a medical hx of HTN who presents to the Emergency Department complaining of insect bite to his left index finger which occurred 2 days ago. 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 He is unsure of what stung him but it is was initially a burning sensation with some associated swelling.  Someone at the job site applied a cream or ointment, which he does not know the ingredients of, but it took away the stinging. His symptoms were largely resolved until he woke this morning with a recurrence of itching and swelling localized to the same finger. He denies pain, but states swelling has limited his range of motion. He has not tried any topical or oral meds to treat it.  He currently denies rash, SOB, wheezing, n/v, itching in throat, warmth to the finger, drainage, fever, chills, diaphoresis, CP, HA, and any other symptoms. Pt previously was treated for HTN and DM, but has not wanted to go to the doctor for some time.  His fianc is with him in the room, and patient later gave history of nocturia, polydipsia, polyuria, and in compliance to medication for diabetes and hypertension.  Past Medical History  Diagnosis Date  . Hypertension    History reviewed. No pertinent past surgical history. History reviewed. No pertinent family history. History  Substance Use Topics  . Smoking status: Never Smoker   . Smokeless tobacco: Not on file  . Alcohol Use: No    Review of Systems  Constitutional: Negative for fever, chills and diaphoresis.  HENT: Negative for trouble  swallowing.   Respiratory: Negative for shortness of breath and wheezing.   Cardiovascular: Negative for chest pain.  Gastrointestinal: Negative for nausea and vomiting.  Musculoskeletal: Positive for joint swelling (left index finger).  Skin: Positive for color change.  Neurological: Negative for headaches.   Allergies  Zithromax  Home Medications   Prior to Admission medications   Medication Sig Start Date End Date Taking? Authorizing Provider  amLODipine (NORVASC) 5 MG tablet Take 2 tablets (10 mg total) by mouth daily. 08/12/14   Delsa Grana, PA-C  doxycycline (VIBRAMYCIN) 100 MG capsule Take 1 capsule (100 mg total) by mouth 2 (two) times daily. 09/29/13   Tiffany Carlota Raspberry, PA-C  glimepiride (AMARYL) 1 MG tablet Take 1 tablet (1 mg total) by mouth daily before breakfast. 08/12/14   Delsa Grana, PA-C  hydrochlorothiazide (HYDRODIURIL) 12.5 MG tablet Take 2 tablets (25 mg total) by mouth daily. 08/12/14   Delsa Grana, PA-C  HYDROcodone-acetaminophen (NORCO/VICODIN) 5-325 MG per tablet Take 1-2 tablets by mouth every 4 (four) hours as needed. 09/29/13   Tiffany Carlota Raspberry, PA-C  ondansetron (ZOFRAN) 4 MG tablet Take 1 tablet (4 mg total) by mouth every 6 (six) hours. 09/29/13   Tiffany Carlota Raspberry, PA-C   BP 196/126 mmHg  Pulse 85  Temp(Src) 98.2 F (36.8 C) (Oral)  Resp 18  SpO2 99% Physical Exam  Constitutional: He is oriented to person, place, and time. He appears well-developed  and well-nourished. No distress.  HENT:  Head: Normocephalic and atraumatic.  Mouth/Throat: Oropharynx is clear and moist. No oropharyngeal exudate.  Eyes: Conjunctivae and EOM are normal. Pupils are equal, round, and reactive to light.  Neck: Neck supple. No tracheal deviation present.  Cardiovascular: Normal rate, regular rhythm and normal heart sounds.  Exam reveals no gallop and no friction rub.   No murmur heard. Pulmonary/Chest: Effort normal and breath sounds normal. No respiratory distress. He has no wheezes.  He has no rales.  Musculoskeletal: Normal range of motion.  Neurological: He is alert and oriented to person, place, and time.  Skin: Skin is warm and dry. No lesion and no rash noted. There is erythema (minor).  Second digit left hand edematous with minor erythema, no exudate, no lesion, and no rash. Not warm to the touch. Surround tissues at palmar and dorsal aspect of hand and finger 1, 3-5 nl without edema or erythema. Nl cap refill and nl sensation.   Psychiatric: He has a normal mood and affect. His behavior is normal.  Nursing note and vitals reviewed.   ED Course  Procedures (including critical care time) DIAGNOSTIC STUDIES: Oxygen Saturation is 98% on RA, nl by my interpretation.    COORDINATION OF CARE: 10:32 AM-Discussed treatment plan which includes benadryl, steroids, f/u with PCP, refill HTN medications with pt at bedside and pt agreed to plan.   Labs Review Labs Reviewed  URINALYSIS, ROUTINE W REFLEX MICROSCOPIC (NOT AT [facility]) - Abnormal; Notable for the following:    Specific Gravity, Urine 1.034 (*)    Glucose, UA >1000 (*)    Protein, ur 30 (*)    All other components within normal limits  I-STAT CHEM 8, ED - Abnormal; Notable for the following:    Creatinine, Ser 1.40 (*)    Glucose, Bld 267 (*)    All other components within normal limits  URINE MICROSCOPIC-ADD ON    Imaging Review No results found.   EKG Interpretation None      MDM   Final diagnoses:  Insect bite  Essential hypertension  Type 2 diabetes mellitus without complication   Pt seen for bee sting to finger that occurred one day ago, but today he had increased swelling and itchiness.  He has not tried anything to help with the itching, he is not having any signs of anaphylaxis or cellulitis, denies any throat tightness, feeling of closure, difficulty breathing, wheezing, nausea, vomiting, diarrhea, fever or chills.  Will treat with Benadryl and Pepcid.  Medications  diphenhydrAMINE  (BENADRYL) capsule 50 mg (50 mg Oral Given 08/12/14 1126)  famotidine (PEPCID) tablet 40 mg (40 mg Oral Given 08/12/14 1126)  amLODipine (NORVASC) tablet 5 mg (5 mg Oral Given 08/12/14 1330)  With re-evaluation of finger after meds, complaining of polyuria, nocturia, and polydypsia and feeling very dehydrated even though he drinks large amounts of fluids throughout the day. We had a Ketron discussion about his elevated blood pressure and the symptoms of elevated blood sugar, including the consequences of Searls-standing hypertension and diabetes, such as CHF, coronary artery disease, vascular disease, renal disease. We agreed to some labs to evaluate kidney function and hyperglycemia, and he agreed to resume his previous home meds, which he did well on, for hypertension and diabetes, and he confirms, as well as his fiance confirms, that they will establish follow up at a primary care doctor.    Will get CBC, BMP, urine to check glucose and kidney function, because planning of prescribing previous  home meds -Norvasc, hydrochlorothiazide, and glimepiride.   Labs significant for elevated creatinine at 1.40, hyperglycemia at 267.  UA confirms glycosuria with proteinuria, and elevated specific gravity confirming dehydration.  Patient was given a dose of his home meds for hypertension, but remained hypertensive while in fast track, without signs of hypertensive urgency or emergency.  He did not have any headache, nausea, vomiting, abdominal pain, chest pain, shortness of breath, and had normal neuro exam.   Filed Vitals:   08/12/14 1015 08/12/14 1119 08/12/14 1406  BP: 195/123 176/118 196/126  Pulse: 85 79 85  Temp: 98.2 F (36.8 C)    TempSrc: Oral    Resp: 18 16 18   SpO2: 98% 99% 99%   Pt verbalized understand of the importance of med compliance.  Was comfortable with being discharged to home with prescriptions for his home meds.  His finger swelling and itching was improved.  Pt continued to be  hypertensive after home meds, but without any complaints, did not want to give additional antihypertensive medicine while in fast track.  Pt was discharged home with return precautions for symptoms of HTN and hyperglycemia, and understood importance of f/u with PCP for HTN and DM meds.  I personally performed the services described in this documentation, which was scribed in my presence. The recorded information has been reviewed and is accurate.    Delsa Grana, PA-C 08/16/14 2144  Nat Christen, MD 08/18/14 1054

## 2014-10-07 ENCOUNTER — Emergency Department (HOSPITAL_COMMUNITY)
Admission: EM | Admit: 2014-10-07 | Discharge: 2014-10-07 | Disposition: A | Discharge status: Home / Self Care | Payer: BLUE CROSS/BLUE SHIELD | Attending: Emergency Medicine | Admitting: Emergency Medicine

## 2014-10-07 ENCOUNTER — Encounter (HOSPITAL_COMMUNITY): Payer: Self-pay | Admitting: *Deleted

## 2014-10-07 DIAGNOSIS — Z79899 Other long term (current) drug therapy: Secondary | ICD-10-CM | POA: Insufficient documentation

## 2014-10-07 DIAGNOSIS — H6092 Unspecified otitis externa, left ear: Secondary | ICD-10-CM | POA: Insufficient documentation

## 2014-10-07 DIAGNOSIS — I10 Essential (primary) hypertension: Secondary | ICD-10-CM | POA: Insufficient documentation

## 2014-10-07 DIAGNOSIS — R51 Headache: Secondary | ICD-10-CM | POA: Insufficient documentation

## 2014-10-07 MED ORDER — CIPROFLOXACIN-DEXAMETHASONE 0.3-0.1 % OT SUSP: 4.0000 [drp] | Freq: Two times a day (BID) | OTIC | Status: DC

## 2014-10-07 MED ORDER — TRAMADOL HCL 50 MG PO TABS: 50.0000 mg | ORAL_TABLET | Freq: Four times a day (QID) | ORAL | Status: DC | PRN

## 2014-10-07 MED ORDER — IBUPROFEN 800 MG PO TABS: 800.0000 mg | ORAL_TABLET | Freq: Three times a day (TID) | ORAL | Status: DC | PRN

## 2014-10-07 MED ORDER — IBUPROFEN 800 MG PO TABS
800.0000 mg | ORAL_TABLET | Freq: Once | ORAL | Status: AC
  Administered 2014-10-07: 800 mg via ORAL
  Filled 2014-10-07: qty 1

## 2014-10-07 NOTE — Discharge Instructions (Signed)

## 2014-10-07 NOTE — ED Provider Notes (Signed)
TIME SEEN: 3:04 AM   CHIEF COMPLAINT: Facial Pain  HPI: HPI Comments: Harold Clements is a 34 y.o. male with history of hypertension who presents to the Emergency Department complaining of new left sided facial swelling and pain onset 1 day prior. Pt states that he has associated ear pain. Pt doesn't report any medications PTA. Pt denies injury or trauma to the face. Pt does report some recent swimming in the pool and ocean while on vacation 1 week prior. Denies fever, CP, sore throat or dental pain. Pt does report Zithromax allergy.    ROS: See HPI Constitutional: no fever  Eyes: no drainage  ENT: no runny nose   Cardiovascular:  no chest pain  Resp: no SOB  GI: no vomiting GU: no dysuria Integumentary: no rash  Allergy: no hives  Musculoskeletal: no leg swelling  Neurological: no slurred speech ROS otherwise negative  PAST MEDICAL HISTORY/PAST SURGICAL HISTORY:  Past Medical History  Diagnosis Date  . Hypertension     MEDICATIONS:  Prior to Admission medications   Medication Sig Start Date End Date Taking? Authorizing Provider  amLODipine (NORVASC) 5 MG tablet Take 2 tablets (10 mg total) by mouth daily. Patient taking differently: Take 5 mg by mouth 2 (two) times daily.  08/12/14  Yes Delsa Grana, PA-C  glimepiride (AMARYL) 1 MG tablet Take 1 tablet (1 mg total) by mouth daily before breakfast. 08/12/14  Yes Delsa Grana, PA-C  hydrochlorothiazide (HYDRODIURIL) 12.5 MG tablet Take 2 tablets (25 mg total) by mouth daily. 08/12/14  Yes Delsa Grana, PA-C    ALLERGIES:  Allergies  Allergen Reactions  . Zithromax [Azithromycin Dihydrate] Anaphylaxis    SOCIAL HISTORY:  History  Substance Use Topics  . Smoking status: Never Smoker   . Smokeless tobacco: Never Used  . Alcohol Use: No    FAMILY HISTORY: No family history on file.  EXAM: BP 182/117 mmHg  Pulse 97  Temp(Src) 98.5 F (36.9 C) (Oral)  Resp 12  Ht 5\' 10"  (1.778 m)  Wt 274 lb (124.286 kg)  BMI 39.32 kg/m2   SpO2 97%   CONSTITUTIONAL: Alert and oriented and responds appropriately to questions. Well-appearing; well-nourished HEAD: Normocephalic EYES: Conjunctivae clear, PERRL ENT: normal nose; no rhinorrhea; moist mucous membranes; pharynx without lesions noted; tender over preauricular area and with manipulation of left pinna, TM's clear bilaterally without bulging or erythema or purulence, Swelling of external auditory ear canal on left side with no drainage,  No mastoid tenderness or erythema, Some dental carries without abscess, no ludwig's angina, no appreciable facial swelling erythema or warmth. NECK: Supple, no meningismus, no LAD  CARD: RRR; S1 and S2 appreciated; no murmurs, no clicks, no rubs, no gallops RESP: Normal chest excursion without splinting or tachypnea; breath sounds clear and equal bilaterally; no wheezes, no rhonchi, no rales, no hypoxia or respiratory distress, speaking full sentences ABD/GI: Normal bowel sounds; non-distended; soft, non-tender, no rebound, no guarding, no peritoneal signs BACK:  The back appears normal and is non-tender to palpation, there is no CVA tenderness EXT: Normal ROM in all joints; non-tender to palpation; no edema; normal capillary refill; no cyanosis, no calf tenderness or swelling    SKIN: Normal color for age and race; warm NEURO: Moves all extremities equally, sensation to light touch intact diffusely, cranial nerves II through XII intact PSYCH: The patient's mood and manner are appropriate. Grooming and personal hygiene are appropriate.  MEDICAL DECISION MAKING: Patient here with otitis externa. No sign of otitis media, mastoiditis.  No sign of dental abscess or Ludwig angina. He is mildly hypertensive. Denies chest pain, shortness of breath, numbness, tingling or focal weakness. No headache. Has not yet taken his blood pressure medication. We'll discharge on Ciprodex and with pain medication. Discussed return precautions. Provide with outpatient  follow-up information. He verbalized understanding and is comfortable with plan.  I personally performed the services described in this documentation, which was scribed in my presence. The recorded information has been reviewed and is accurate.    Tyler, DO 10/07/14 [phone]

## 2014-10-07 NOTE — ED Notes (Signed)
Patient presents with c/o left facial swelling and pain.  Unknown bad teeth and unknown injury.  Also feels like his ear is swelling

## 2014-10-07 NOTE — ED Notes (Signed)
Patient has history of HTN and has been taking his meds but niot tonights dose

## 2014-10-07 NOTE — ED Notes (Signed)
Ward, MD at bedside.

## 2014-10-08 ENCOUNTER — Encounter (HOSPITAL_COMMUNITY): Payer: Self-pay | Admitting: *Deleted

## 2014-10-08 ENCOUNTER — Emergency Department (HOSPITAL_COMMUNITY)
Admission: EM | Admit: 2014-10-08 | Discharge: 2014-10-08 | Disposition: A | Discharge status: Home / Self Care | Payer: BLUE CROSS/BLUE SHIELD | Attending: Emergency Medicine | Admitting: Emergency Medicine

## 2014-10-08 DIAGNOSIS — I10 Essential (primary) hypertension: Secondary | ICD-10-CM | POA: Insufficient documentation

## 2014-10-08 DIAGNOSIS — H6092 Unspecified otitis externa, left ear: Secondary | ICD-10-CM | POA: Insufficient documentation

## 2014-10-08 MED ORDER — HYDROCHLOROTHIAZIDE 12.5 MG PO TABS: 25.0000 mg | ORAL_TABLET | Freq: Every day | ORAL | Status: DC

## 2014-10-08 MED ORDER — NEOMYCIN-POLYMYXIN-HC 1 % OT SOLN
4.0000 [drp] | Freq: Once | OTIC | Status: DC
  Filled 2014-10-08: qty 10

## 2014-10-08 MED ORDER — NEOMYCIN-COLIST-HC-THONZONIUM 3.3-3-10-0.5 MG/ML OT SUSP
4.0000 [drp] | Freq: Once | OTIC | Status: DC
Start: 2014-10-08 — End: 2014-10-08
  Filled 2014-10-08: qty 5

## 2014-10-08 MED ORDER — ACETAMINOPHEN 500 MG PO TABS
1000.0000 mg | ORAL_TABLET | Freq: Once | ORAL | Status: AC
  Administered 2014-10-08: 1000 mg via ORAL
  Filled 2014-10-08: qty 2

## 2014-10-08 MED ORDER — AMLODIPINE BESYLATE 5 MG PO TABS: 10.0000 mg | ORAL_TABLET | Freq: Every day | ORAL | Status: DC

## 2014-10-08 MED ORDER — NEOMYCIN-POLYMYXIN-HC 3.5-10000-1 OT SUSP
4.0000 [drp] | Freq: Once | OTIC | Status: AC
  Administered 2014-10-08: 4 [drp] via OTIC

## 2014-10-08 MED ORDER — KETOROLAC TROMETHAMINE 60 MG/2ML IM SOLN: 60.0000 mg | Freq: Once | INTRAMUSCULAR | Status: DC

## 2014-10-08 NOTE — ED Notes (Signed)
MD Resident at the bedside  

## 2014-10-08 NOTE — Discharge Instructions (Signed)
4 drops in left ear 3 times daily for 7 days. Remove wick in left ear in 1 day   Otitis Externa Otitis externa is a bacterial or fungal infection of the outer ear canal. This is the area from the eardrum to the outside of the ear. Otitis externa is sometimes called "swimmer's ear." CAUSES  Possible causes of infection include:  Swimming in dirty water.  Moisture remaining in the ear after swimming or bathing.  Mild injury (trauma) to the ear.  Objects stuck in the ear (foreign body).  Cuts or scrapes (abrasions) on the outside of the ear. SIGNS AND SYMPTOMS  The first symptom of infection is often itching in the ear canal. Later signs and symptoms may include swelling and redness of the ear canal, ear pain, and yellowish-white fluid (pus) coming from the ear. The ear pain may be worse when pulling on the earlobe. DIAGNOSIS  Your health care provider will perform a physical exam. A sample of fluid may be taken from the ear and examined for bacteria or fungi. TREATMENT  Antibiotic ear drops are often given for 10 to 14 days. Treatment may also include pain medicine or corticosteroids to reduce itching and swelling. HOME CARE INSTRUCTIONS   Apply antibiotic ear drops to the ear canal as prescribed by your health care provider.  Take medicines only as directed by your health care provider.  If you have diabetes, follow any additional treatment instructions from your health care provider.  Keep all follow-up visits as directed by your health care provider. PREVENTION   Keep your ear dry. Use the corner of a towel to absorb water out of the ear canal after swimming or bathing.  Avoid scratching or putting objects inside your ear. This can damage the ear canal or remove the protective wax that lines the canal. This makes it easier for bacteria and fungi to grow.  Avoid swimming in lakes, polluted water, or poorly chlorinated pools.  You may use ear drops made of rubbing alcohol and  vinegar after swimming. Combine equal parts of white vinegar and alcohol in a bottle. Put 3 or 4 drops into each ear after swimming. SEEK MEDICAL CARE IF:   You have a fever.  Your ear is still red, swollen, painful, or draining pus after 3 days.  Your redness, swelling, or pain gets worse.  You have a severe headache.  You have redness, swelling, pain, or tenderness in the area behind your ear. MAKE SURE YOU:   Understand these instructions.  Will watch your condition.  Will get help right away if you are not doing well or get worse. Document Released: 02/16/2005 Document Revised: 07/03/2013 Document Reviewed: 03/05/2011 [facility] Patient Information 2015 [place], [place]. This information is not intended to replace advice given to you by your health care provider. Make sure you discuss any questions you have with your health care provider.

## 2014-10-08 NOTE — ED Notes (Signed)
Pt is here with left ear into jaw pain for the past three days.

## 2014-10-08 NOTE — ED Notes (Signed)
MD at the bedside  

## 2014-10-08 NOTE — ED Provider Notes (Signed)
History   Chief Complaint  Patient presents with  . Otalgia    HPI 34 year old male with history of hypertension presents to ED for evaluation of left ear pain. Patient was seen in this ED yesterday and diagnosed with otitis externa and prescribed Ciprodex. Patient reports he was unable to afford the 200 on medication and has had worsening pain since then. Patient denies any fevers, nausea, vomiting. He reports pain is currently rated moderate. He has not attempted any medications for his pain. Modifying factors include palpation or movement of his left ear which worsens his pain.  Patient states difficult for him to hear out of his left ear and he has also had some clear/yellowish drainage.  Past medical/surgical history, social history, medications, allergies and FH have been reviewed with patient and/or in documentation. Furthermore, if pt family or friend(s) present, additional historical information was obtained from them.  Past Medical History  Diagnosis Date  . Hypertension    History reviewed. No pertinent past surgical history. No family history on file. History  Substance Use Topics  . Smoking status: Never Smoker   . Smokeless tobacco: Never Used  . Alcohol Use: No     Review of Systems Constitutional: - F/C, -fatigue.  HENT: - congestion, -rhinorrhea, -sore throat.   positive for ear pain and drainage Eyes: - eye pain, -visual disturbance.  Respiratory: - cough, -SOB, -hemoptysis.   Cardiovascular: - CP, -palps.  Gastrointestinal: - N/V/D, -abd pain  Genitourinary: - flank pain, -dysuria, -frequency.  Musculoskeletal: - myalgia/arthritis, -joint swelling, -gait abnormality, -back pain, -neck pain/stiffness, -leg pain/swelling.  Skin: - rash/lesion.  Neurological: - focal weakness, -lightheadedness, -dizziness, -numbness, -HA.  All other systems reviewed and are negative.   Physical Exam  Physical Exam  ED Triage Vitals  Enc Vitals Group     BP 10/08/14  1451 166/112 mmHg     Pulse Rate 10/08/14 1430 102     Resp 10/08/14 1430 17     Temp 10/08/14 1430 98.9 F (37.2 C)     Temp Source 10/08/14 1430 Oral     SpO2 10/08/14 1430 99 %     Weight 10/08/14 1430 274 lb (124.286 kg)     Height 10/08/14 1430 5\' 10"  (1.778 m)     Head Cir --      Peak Flow --      Pain Score 10/08/14 1437 10     Pain Loc --      Pain Edu? --      Excl. in [place]? --    Constitutional: Well-appearing 34 year old male in no apparent distress Head: Normocephalic and atraumatic.  Eyes: Extraocular motion intact, no scleral icterus Ears: Left ear has clear/yellowish drainage from external canal which is filled with yellowish material. This was suctioned and there is no signs of perforated TM. Patient has erythema to canal and tenderness with movement of pinna and tragus. No signs of cellulitis of the ear. No pain over her mastoid. Mouth: MMM, OP clear Neck: Supple without meningismus, mass, or overt JVD Respiratory: No respiratory distress. Normal WOB. No w/r/g. CV: RRR, no obvious murmurs.  Pulses +2 and symmetric. Euvolemic Abdomen: Soft, NT, ND, no r/g. No mass.  MSK: Extremities are atraumatic without deformity, ROM intact Skin: Warm, dry, intact without rash Neuro: AAOx4, MAE 5/5 sym, no focal deficit noted   ED Course  Procedures  MDM: OSMOND CEDOTAL is a 34 y.o. male with H&P as above who p/w CC: Ear pain  Patient has  otitis externa. His ear was suctioned and a wick with Cortisporin was placed. Patient was advised to follow closely with [facility]. No signs of mastoiditis or cellulitis. Patient was instructed on how to administer drops at home. Strict return precautions were discussed at length and questions were answered. Patient is stable for discharge.  Old records reviewed (if available). Labs and imaging reviewed personally by myself and considered in medical decision making if ordered.  Clinical Impression: 1. Otitis externa, left      Disposition: Discharge  Condition: Good  I have discussed the results, Dx and Tx plan with the pt(& family if present). He/she/they expressed understanding and agree(s) with the plan. Discharge instructions discussed at great length. Strict return precautions discussed and pt &/or family have verbalized understanding of the instructions. No further questions at time of discharge.    Discharge Medication List as of 10/08/2014  9:41 PM      Follow Up: [facility] [address] [place] [phone]  To establish primary care, call above  [facility] [address] [id number] [place] [phone]  If symptoms worsen   Pt seen in conjunction with Dr. Adria Dill, [place] [facility] Resident - PGY-3      Kirstie Peri, MD 10/09/14 [id number]  Orlie Dakin, MD 10/09/14 1136

## 2014-10-08 NOTE — ED Provider Notes (Signed)
Complains of left sided ear pain. Patient feels much improved since treatment here. He'll be written for 1 month's worth of his antihypertensives and referred to [place], MD 10/08/14 [phone]

## 2014-10-25 ENCOUNTER — Encounter (HOSPITAL_COMMUNITY): Payer: Self-pay | Admitting: *Deleted

## 2014-10-25 ENCOUNTER — Emergency Department (HOSPITAL_COMMUNITY)
Admission: EM | Admit: 2014-10-25 | Discharge: 2014-10-26 | Disposition: A | Discharge status: Home / Self Care | Payer: BLUE CROSS/BLUE SHIELD | Attending: Emergency Medicine | Admitting: Emergency Medicine

## 2014-10-25 DIAGNOSIS — Z79899 Other long term (current) drug therapy: Secondary | ICD-10-CM | POA: Insufficient documentation

## 2014-10-25 DIAGNOSIS — I10 Essential (primary) hypertension: Secondary | ICD-10-CM

## 2014-10-25 DIAGNOSIS — H6093 Unspecified otitis externa, bilateral: Secondary | ICD-10-CM

## 2014-10-25 MED ORDER — NEOMYCIN-POLYMYXIN-HC 1 % OT SOLN
3.0000 [drp] | Freq: Four times a day (QID) | OTIC | Status: DC
  Administered 2014-10-26: 3 [drp] via OTIC
  Filled 2014-10-25: qty 10

## 2014-10-25 MED ORDER — HYDROCHLOROTHIAZIDE 12.5 MG PO TABS: 25.0000 mg | ORAL_TABLET | Freq: Every day | ORAL | Status: DC

## 2014-10-25 MED ORDER — CIPROFLOXACIN HCL 500 MG PO TABS: 500.0000 mg | ORAL_TABLET | Freq: Two times a day (BID) | ORAL | Status: DC

## 2014-10-25 MED ORDER — AMLODIPINE BESYLATE 5 MG PO TABS: 10.0000 mg | ORAL_TABLET | Freq: Every day | ORAL | Status: DC

## 2014-10-25 MED ORDER — HYDROCODONE-ACETAMINOPHEN 5-325 MG PO TABS
1.0000 | ORAL_TABLET | Freq: Once | ORAL | Status: AC
  Administered 2014-10-25: 1 via ORAL

## 2014-10-25 MED ORDER — HYDROCODONE-ACETAMINOPHEN 5-325 MG PO TABS
ORAL_TABLET | ORAL | Status: AC
  Filled 2014-10-25: qty 1

## 2014-10-25 NOTE — ED Notes (Signed)
Seen before for left ear pain, today he has right ear pain and right facial pain

## 2014-10-25 NOTE — ED Notes (Signed)
Provider notified of patient's blood pressure.  

## 2014-10-25 NOTE — ED Notes (Signed)
Patient states he took his second BP med 30  mins PTA

## 2014-10-25 NOTE — ED Provider Notes (Signed)
CSN: [id number]     Arrival date & time 10/25/14  2002 History   First MD Initiated Contact with Patient 10/25/14 2106     Chief Complaint  Patient presents with  . Facial Pain  . Otalgia    HPI   34 year old male presents today with bilateral ear drainage. Patient was seen on 10/08/2014 for similar presentation. He had drainage from his left ear. Per chart review the year was suctioned out with wick placement. Patient reports he took the wick out after 2 days, but did not follow-up with [facility] and wellness. He reports that the left ear continues to drain but is no longer painful. He notes that over the last week the right ear has started draining with some discomfort with palpation of the ear. Patient also notes some right-sided facial pain, denies pain to palpation, swelling, erythema. Patient notes originally he had difficulty hearing out of the years, but this is steadily improved. Patient noted he has hypertension, is not completely compliant with his medications noting he usually takes them but has recently had difficulty remembering them. He reports he is almost out of the medications. Patient denies headache, changes in vision, chest pain, shortness of breath, abdominal pain, changes in the color clarity or characteristics of his urine.   Past Medical History  Diagnosis Date  . Hypertension    History reviewed. No pertinent past surgical history. No family history on file. Social History  Substance Use Topics  . Smoking status: Never Smoker   . Smokeless tobacco: Never Used  . Alcohol Use: No    Review of Systems  All other systems reviewed and are negative.   Allergies  Zithromax  Home Medications   Prior to Admission medications   Medication Sig Start Date End Date Taking? Authorizing Provider  glimepiride (AMARYL) 1 MG tablet Take 1 tablet (1 mg total) by mouth daily before breakfast. 08/12/14  Yes Delsa Grana, PA-C  amLODipine (NORVASC) 5 MG tablet Take 2 tablets  (10 mg total) by mouth daily. 10/25/14   Okey Regal, PA-C  ciprofloxacin (CIPRO) 500 MG tablet Take 1 tablet (500 mg total) by mouth every 12 (twelve) hours. 10/25/14   Okey Regal, PA-C  ciprofloxacin-dexamethasone (CIPRODEX) otic suspension Place 4 drops into the left ear 2 (two) times daily. For one week Patient not taking: Reported on 10/25/2014 10/07/14   Delice Bison Ward, DO  hydrochlorothiazide (HYDRODIURIL) 12.5 MG tablet Take 2 tablets (25 mg total) by mouth daily. 10/25/14   Okey Regal, PA-C  ibuprofen (ADVIL,MOTRIN) 800 MG tablet Take 1 tablet (800 mg total) by mouth every 8 (eight) hours as needed for mild pain. Patient not taking: Reported on 10/25/2014 10/07/14   Delice Bison Ward, DO  traMADol (ULTRAM) 50 MG tablet Take 1 tablet (50 mg total) by mouth every 6 (six) hours as needed. Patient not taking: Reported on 10/25/2014 10/07/14   Kristen N Ward, DO   BP 197/123 mmHg  Pulse 84  Temp(Src) 98.7 F (37.1 C) (Oral)  Resp 16  Ht 5\' 10"  (1.778 m)  Wt 271 lb 8 oz (123.152 kg)  BMI 38.96 kg/m2  SpO2 98%   Physical Exam  Constitutional: He is oriented to person, place, and time. He appears well-developed and well-nourished.  HENT:  Head: Normocephalic and atraumatic.  Purulent drainage from both ears, canal swollen unable to visualize TM. Minor pain with manipulation of the right pinna, no mastoid tenderness, no signs of swelling edema, warmth to the surrounding soft tissues.  Eyes: Conjunctivae are normal. Pupils are equal, round, and reactive to light. Right eye exhibits no discharge. Left eye exhibits no discharge. No scleral icterus.  Neck: Normal range of motion. No JVD present. No tracheal deviation present.  Pulmonary/Chest: Effort normal. No stridor.  Neurological: He is alert and oriented to person, place, and time. He has normal strength. No cranial nerve deficit or sensory deficit. Coordination normal. GCS eye subscore is 4. GCS verbal subscore is 5. GCS motor subscore is  6.  Psychiatric: He has a normal mood and affect. His behavior is normal. Judgment and thought content normal.  Nursing note and vitals reviewed.   ED Course  Procedures (including critical care time) Labs Review Labs Reviewed - No data to display  Imaging Review No results found. I have personally reviewed and evaluated these images and lab results as part of my medical decision-making.   EKG Interpretation None      MDM   Final diagnoses:  Otitis externa, bilateral  Essential hypertension    Labs:  Imaging:  Consults:  Therapeutics:Corticosporin  Discharge Meds: Corticosporin, hydrochlorothiazide, amlodipine  Assessment/Plan:Patient's presentation most consistent with otitis externa. I was unable to visualize the TMs. Chart review shows that previous provider had irrigated the year, with no signs of TM perforation, this is unlikely a ruptured otitis media. The patient had been using external antibiotic drops with no improvement in symptoms. He'll be placed on oral antibiotics, your wick with Corticosporin drops. Patient is instructed to contact [place] [facility], or [facility] for further evaluation and management of both the otitis externa and his hypertension. Patient has no signs or symptoms of end organ damage today. Patient reports that he has been noncompliant with medication, he will be given a prescription for his daily meds. Patient is given follow-up information for ENT, he is encouraged contact them first thing tomorrow morning to schedule immediate follow-up evaluation. He is given strict return precautions, verbalized understanding and agreement for today's plan and had no further questions concerns of some discharge.         Okey Regal, PA-C 10/26/14 0030  Lacretia Leigh, MD 10/29/14 0800

## 2014-10-25 NOTE — ED Notes (Signed)
Provider at bedside

## 2014-10-26 NOTE — ED Notes (Signed)
Provider states patient asymptomatic with blood pressure and patient took last blood pressure medication 1930.

## 2014-10-26 NOTE — Discharge Instructions (Signed)
Please use oral and topical antibiotics. Please follow-up with [facility] [place] and wellness for the [facility] in 2 days for reevaluation. Please return immediately if new or worsening signs or symptoms present. Please contact ENT and schedule first available appointment. Please take your blood pressure medication, please inform primary care provider of these elevated readings.

## 2014-11-01 ENCOUNTER — Encounter (HOSPITAL_COMMUNITY): Payer: Self-pay | Admitting: *Deleted

## 2014-11-01 ENCOUNTER — Emergency Department (HOSPITAL_COMMUNITY)
Admission: EM | Admit: 2014-11-01 | Discharge: 2014-11-01 | Disposition: A | Discharge status: Home / Self Care | Payer: BLUE CROSS/BLUE SHIELD | Attending: Emergency Medicine | Admitting: Emergency Medicine

## 2014-11-01 DIAGNOSIS — H6091 Unspecified otitis externa, right ear: Secondary | ICD-10-CM | POA: Insufficient documentation

## 2014-11-01 DIAGNOSIS — Z79899 Other long term (current) drug therapy: Secondary | ICD-10-CM | POA: Insufficient documentation

## 2014-11-01 DIAGNOSIS — I10 Essential (primary) hypertension: Secondary | ICD-10-CM | POA: Insufficient documentation

## 2014-11-01 NOTE — Discharge Instructions (Signed)

## 2014-11-01 NOTE — ED Notes (Signed)
Patient presents with right ear bleeding.  No bleeding at this time

## 2014-11-01 NOTE — ED Notes (Signed)
Per provider's instructions place ear wick in right ear.

## 2014-11-01 NOTE — ED Provider Notes (Signed)
CSN: [id number]     Arrival date & time 11/01/14  [id number] History   First MD Initiated Contact with Patient 11/01/14 0407     Chief Complaint  Patient presents with  . Ear Bleeding       HPI Patient presents to emergency department complaining of draining of blood out of his right ear.  He was recently treated for otitis externa with antibiotic drops.  He states he is continuing to have discomfort and pain in his right ear.  He denies pain behind his right ear.  No fevers or chills.  No sore throat.  No other complaints.  No significant pain at this time.   Past Medical History  Diagnosis Date  . Hypertension    History reviewed. No pertinent past surgical history. No family history on file. Social History  Substance Use Topics  . Smoking status: Never Smoker   . Smokeless tobacco: Never Used  . Alcohol Use: No    Review of Systems  All other systems reviewed and are negative.     Allergies  Zithromax  Home Medications   Prior to Admission medications   Medication Sig Start Date End Date Taking? Authorizing Provider  amLODipine (NORVASC) 5 MG tablet Take 2 tablets (10 mg total) by mouth daily. 10/25/14   Okey Regal, PA-C  ciprofloxacin (CIPRO) 500 MG tablet Take 1 tablet (500 mg total) by mouth every 12 (twelve) hours. 10/25/14   Okey Regal, PA-C  ciprofloxacin-dexamethasone (CIPRODEX) otic suspension Place 4 drops into the left ear 2 (two) times daily. For one week Patient not taking: Reported on 10/25/2014 10/07/14   Delice Bison Ward, DO  glimepiride (AMARYL) 1 MG tablet Take 1 tablet (1 mg total) by mouth daily before breakfast. 08/12/14   Delsa Grana, PA-C  hydrochlorothiazide (HYDRODIURIL) 12.5 MG tablet Take 2 tablets (25 mg total) by mouth daily. 10/25/14   Okey Regal, PA-C  ibuprofen (ADVIL,MOTRIN) 800 MG tablet Take 1 tablet (800 mg total) by mouth every 8 (eight) hours as needed for mild pain. Patient not taking: Reported on 10/25/2014 10/07/14   Delice Bison Ward, DO   traMADol (ULTRAM) 50 MG tablet Take 1 tablet (50 mg total) by mouth every 6 (six) hours as needed. Patient not taking: Reported on 10/25/2014 10/07/14   Kristen N Ward, DO   BP 187/126 mmHg  Pulse 83  Temp(Src) 98.3 F (36.8 C) (Oral)  Resp 18  Wt 271 lb (122.925 kg)  SpO2 100% Physical Exam  Constitutional: He is oriented to person, place, and time. He appears well-developed and well-nourished.  HENT:  Head: Normocephalic and atraumatic.  Significant swelling of the right external auditory canal.  No obvious bleeding noted.  Eyes: EOM are normal.  Neck: Normal range of motion.  Cardiovascular: Normal rate and regular rhythm.   Pulmonary/Chest: Effort normal.  Abdominal: Soft.  Musculoskeletal: Normal range of motion.  Neurological: He is alert and oriented to person, place, and time.  Skin: Skin is warm and dry.  Psychiatric: He has a normal mood and affect. Judgment normal.  Nursing note and vitals reviewed.   ED Course  Procedures (including critical care time) Labs Review Labs Reviewed - No data to display  Imaging Review No results found. I have personally reviewed and evaluated these images and lab results as part of my medical decision-making.   EKG Interpretation None      MDM   Final diagnoses:  External otitis of right ear     The patient has such  significant swelling of his right external auditory canal and don't believe any antibiotics have been getting in there.  A wick was placed into his right external auditory canal that should improve his symptoms.  No mastoid symptoms.  Overall well-appearing.  Discharge home in good condition.  Jola Schmidt, MD 11/01/14 [phone]

## 2015-08-05 ENCOUNTER — Encounter (HOSPITAL_COMMUNITY): Payer: Self-pay | Admitting: *Deleted

## 2015-08-05 ENCOUNTER — Emergency Department (HOSPITAL_COMMUNITY)
Admission: EM | Admit: 2015-08-05 | Discharge: 2015-08-06 | Disposition: A | Discharge status: Home / Self Care | Payer: BLUE CROSS/BLUE SHIELD | Attending: Emergency Medicine | Admitting: Emergency Medicine

## 2015-08-05 DIAGNOSIS — J029 Acute pharyngitis, unspecified: Secondary | ICD-10-CM | POA: Insufficient documentation

## 2015-08-05 DIAGNOSIS — Z7984 Long term (current) use of oral hypoglycemic drugs: Secondary | ICD-10-CM | POA: Insufficient documentation

## 2015-08-05 DIAGNOSIS — Z792 Long term (current) use of antibiotics: Secondary | ICD-10-CM | POA: Insufficient documentation

## 2015-08-05 DIAGNOSIS — I10 Essential (primary) hypertension: Secondary | ICD-10-CM

## 2015-08-05 DIAGNOSIS — Z79899 Other long term (current) drug therapy: Secondary | ICD-10-CM | POA: Insufficient documentation

## 2015-08-05 DIAGNOSIS — N189 Chronic kidney disease, unspecified: Secondary | ICD-10-CM

## 2015-08-05 DIAGNOSIS — I129 Hypertensive chronic kidney disease with stage 1 through stage 4 chronic kidney disease, or unspecified chronic kidney disease: Secondary | ICD-10-CM | POA: Insufficient documentation

## 2015-08-05 LAB — BASIC METABOLIC PANEL
ANION GAP: 7 (ref 5–15)
BUN: 15 mg/dL (ref 6–20)
CALCIUM: 9.1 mg/dL (ref 8.9–10.3)
CO2: 26 mmol/L (ref 22–32)
Chloride: 104 mmol/L (ref 101–111)
Creatinine, Ser: 1.75 mg/dL — ABNORMAL HIGH (ref 0.61–1.24)
GFR, EST AFRICAN AMERICAN: 58 mL/min — AB (ref >60)
GFR, EST NON AFRICAN AMERICAN: 50 mL/min — AB (ref >60)
Glucose, Bld: 239 mg/dL — ABNORMAL HIGH (ref 65–99)
Potassium: 3.4 mmol/L — ABNORMAL LOW (ref 3.5–5.1)
SODIUM: 137 mmol/L (ref 135–145)

## 2015-08-05 LAB — CBC WITH DIFFERENTIAL/PLATELET
BASOS ABS: 0 10*3/uL (ref 0.0–0.1)
BASOS PCT: 0 %
EOS ABS: 0.1 10*3/uL (ref 0.0–0.7)
Eosinophils Relative: 1 %
HCT: 39.3 % (ref 39.0–52.0)
Hemoglobin: 13.5 g/dL (ref 13.0–17.0)
Lymphocytes Relative: 34 %
Lymphs Abs: 2.4 10*3/uL (ref 0.7–4.0)
MCH: 26.6 pg (ref 26.0–34.0)
MCHC: 34.4 g/dL (ref 30.0–36.0)
MCV: 77.5 fL — ABNORMAL LOW (ref 78.0–100.0)
Monocytes Absolute: 0.4 10*3/uL (ref 0.1–1.0)
Monocytes Relative: 6 %
Neutro Abs: 4.1 10*3/uL (ref 1.7–7.7)
Neutrophils Relative %: 59 %
PLATELETS: 247 10*3/uL (ref 150–400)
RBC: 5.07 MIL/uL (ref 4.22–5.81)
RDW: 12.9 % (ref 11.5–15.5)
WBC: 7 10*3/uL (ref 4.0–10.5)

## 2015-08-05 LAB — RAPID STREP SCREEN (MED CTR MEBANE ONLY): STREPTOCOCCUS, GROUP A SCREEN (DIRECT): NEGATIVE

## 2015-08-05 MED ORDER — AMLODIPINE BESYLATE 5 MG PO TABS
5.0000 mg | ORAL_TABLET | Freq: Once | ORAL | Status: AC
  Administered 2015-08-05: 5 mg via ORAL
  Filled 2015-08-05: qty 1

## 2015-08-05 MED ORDER — IBUPROFEN 400 MG PO TABS
800.0000 mg | ORAL_TABLET | Freq: Once | ORAL | Status: AC
  Administered 2015-08-05: 800 mg via ORAL
  Filled 2015-08-05: qty 4

## 2015-08-05 MED ORDER — HYDROCHLOROTHIAZIDE 25 MG PO TABS
12.5000 mg | ORAL_TABLET | Freq: Once | ORAL | Status: AC
  Administered 2015-08-05: 12.5 mg via ORAL
  Filled 2015-08-05: qty 1

## 2015-08-05 NOTE — ED Provider Notes (Signed)
CSN: [id number]     Arrival date & time 08/05/15  2219 History  By signing my name below, I, Meriel Flavors, attest that this documentation has been prepared under the direction and in the presence of [employer].  Electronically Signed: Meriel Flavors, ED Scribe. 07/28/2015. 4:01 PM.  Chief Complaint  Patient presents with  . Sore Throat    The history is provided by the patient. No language interpreter was used.    HPI Comments: Harold Clements is a 35 y.o. male with a PMHx of HTN, who presents to the Emergency Department complaining of a constant, mild-moderate, unchanged sore throat that started 3 days ago. Pt reports the pain is on the left side of his throat. Pt states that he has had similar symptoms in the past when he had strep throat,  and reports that his symptoms feels the same. Pt states he has not taken any OTC medication for his pain. Pt reports he has been eating and drinking without difficulty. Pt denies fever, chills, choking, nausea, vomiting, cough, rhinorrhea. Pt also denies any sick contact.   Past Medical History  Diagnosis Date  . Hypertension    History reviewed. No pertinent past surgical history. No family history on file. Social History  Substance Use Topics  . Smoking status: Never Smoker   . Smokeless tobacco: Never Used  . Alcohol Use: No    Review of Systems  Constitutional: Negative for fever and chills.  HENT: Positive for sore throat. Negative for rhinorrhea.   Respiratory: Negative for cough.   Gastrointestinal: Negative for nausea and vomiting.    Allergies  Zithromax  Home Medications   Prior to Admission medications   Medication Sig Start Date End Date Taking? Authorizing Provider  amLODipine (NORVASC) 5 MG tablet Take 2 tablets (10 mg total) by mouth daily. 10/25/14   Okey Regal, PA-C  ciprofloxacin (CIPRO) 500 MG tablet Take 1 tablet (500 mg total) by mouth every 12 (twelve) hours. 10/25/14   Okey Regal, PA-C   ciprofloxacin-dexamethasone (CIPRODEX) otic suspension Place 4 drops into the left ear 2 (two) times daily. For one week Patient not taking: Reported on 10/25/2014 10/07/14   Delice Bison Ward, DO  glimepiride (AMARYL) 1 MG tablet Take 1 tablet (1 mg total) by mouth daily before breakfast. 08/12/14   Delsa Grana, PA-C  hydrochlorothiazide (HYDRODIURIL) 12.5 MG tablet Take 2 tablets (25 mg total) by mouth daily. 10/25/14   Okey Regal, PA-C  ibuprofen (ADVIL,MOTRIN) 800 MG tablet Take 1 tablet (800 mg total) by mouth every 8 (eight) hours as needed for mild pain. Patient not taking: Reported on 10/25/2014 10/07/14   Delice Bison Ward, DO  traMADol (ULTRAM) 50 MG tablet Take 1 tablet (50 mg total) by mouth every 6 (six) hours as needed. Patient not taking: Reported on 10/25/2014 10/07/14   Kristen N Ward, DO   BP 203/134 mmHg  Pulse 100  Temp(Src) 98.7 F (37.1 C) (Oral)  Resp 22  SpO2 99% Physical Exam  Constitutional: He is oriented to person, place, and time. He appears well-developed and well-nourished.  Non-toxic appearance. He does not have a sickly appearance. He does not appear ill.  HENT:  Head: Normocephalic and atraumatic.  Right Ear: External ear normal.  Left Ear: External ear normal.  Nose: Nose normal.  Mouth/Throat: Uvula is midline and mucous membranes are normal. No trismus in the jaw. Posterior oropharyngeal erythema present. No oropharyngeal exudate, posterior oropharyngeal edema or tonsillar abscesses.  No "hot potato" voice.  Tolerating secretions without difficulty.   Eyes: Conjunctivae are normal. Pupils are equal, round, and reactive to light. No scleral icterus.  Neck: Normal range of motion. Neck supple. No tracheal deviation present.  Cardiovascular: Normal rate and regular rhythm.   Pulmonary/Chest: Effort normal and breath sounds normal. No accessory muscle usage or stridor. No respiratory distress. He has no wheezes. He has no rhonchi. He has no rales.  Abdominal: Soft.  Bowel sounds are normal. He exhibits no distension. There is no tenderness.  Musculoskeletal: Normal range of motion.  Lymphadenopathy:    He has no cervical adenopathy.  Neurological: He is alert and oriented to person, place, and time.  Speech clear without dysarthria.  Skin: Skin is warm and dry.  Psychiatric: He has a normal mood and affect. His behavior is normal.    ED Course  Procedures  DIAGNOSTIC STUDIES: Oxygen Saturation is 99% on RA, normal by my interpretation.  COORDINATION OF CARE: 11:30 PM-Will order medication. Discussed treatment plan with pt at bedside and pt agreed to plan.   Labs Review Labs Reviewed  CBC WITH DIFFERENTIAL/PLATELET - Abnormal; Notable for the following:    MCV 77.5 (*)    All other components within normal limits  BASIC METABOLIC PANEL - Abnormal; Notable for the following:    Potassium 3.4 (*)    Glucose, Bld 239 (*)    Creatinine, Ser 1.75 (*)    GFR calc non Af Amer 50 (*)    GFR calc Af Amer 58 (*)    All other components within normal limits  RAPID STREP SCREEN (NOT AT [facility])  CULTURE, GROUP A STREP [facility])   Imaging Review No results found. I have personally reviewed and evaluated these images and lab results as part of my medical decision-making.   EKG Interpretation None      MDM   Final diagnoses:  Pharyngitis  Essential hypertension  Chronic kidney disease, unspecified stage   Patient presents with sore throat.  No other symptoms. No prior treatment.  Hx of HTN, has been out of Norvasc or HCTZ.  BP 203/134.  He is asymptomatic.  HENT exam unremarkable, mild post oropharyngeal erythema.  Uvula midline.  No peritonsillar abscess.  Heart RRR, lungs CTAB, abdomen soft and benign.  Patient appears well, non-toxic, or septic appearing.  Patient given dose of norvasc and HCTZ in ED.  Ibuprofen for pain.  Rapid strep negative.  CBC normal.  BMP shows worsening chronic renal fxn, Cr 1.75.  Doubt epiglottitis, peritonsillar abscess,  or meningitis.  Will refill Norvasc and Amlodipine.  Follow up [facility].  Discussed return precautions.  Discussed importance of HTN management and risks of end organ damage.  Patient agrees and acknowledges the above plan for discharge.   I personally performed the services described in this documentation, which was scribed in my presence. The recorded information has been reviewed and is accurate.      Gloriann Loan, PA-C 08/06/15 [id number]  Everlene Balls, MD 08/06/15 [phone]

## 2015-08-05 NOTE — ED Notes (Signed)
Patient presents stating he has been having a sore throat for about 2 days.  White film on tongue, tonsils swollen.

## 2015-08-06 MED ORDER — AMLODIPINE BESYLATE 5 MG PO TABS: 5.0000 mg | ORAL_TABLET | Freq: Two times a day (BID) | ORAL | Status: DC

## 2015-08-06 MED ORDER — HYDROCHLOROTHIAZIDE 12.5 MG PO CAPS
12.5000 mg | ORAL_CAPSULE | Freq: Two times a day (BID) | ORAL | Status: DC
Start: 2015-08-06 — End: 2015-10-20

## 2015-08-06 NOTE — Discharge Instructions (Signed)
Hypertension Hypertension, commonly called high blood pressure, is when the force of blood pumping through your arteries is too strong. Your arteries are the blood vessels that carry blood from your heart throughout your body. A blood pressure reading consists of a higher number over a lower number, such as 110/72. The higher number (systolic) is the pressure inside your arteries when your heart pumps. The lower number (diastolic) is the pressure inside your arteries when your heart relaxes. Ideally you want your blood pressure below 120/80. Hypertension forces your heart to work harder to pump blood. Your arteries may become narrow or stiff. Having untreated or uncontrolled hypertension can cause heart attack, stroke, kidney disease, and other problems. RISK FACTORS Some risk factors for high blood pressure are controllable. Others are not.  Risk factors you cannot control include:   Race. You may be at higher risk if you are African American.  Age. Risk increases with age.  Gender. Men are at higher risk than women before age 45 years. After age 65, women are at higher risk than men. Risk factors you can control include:  Not getting enough exercise or physical activity.  Being overweight.  Getting too much fat, sugar, calories, or salt in your diet.  Drinking too much alcohol. SIGNS AND SYMPTOMS Hypertension does not usually cause signs or symptoms. Extremely high blood pressure (hypertensive crisis) may cause headache, anxiety, shortness of breath, and nosebleed. DIAGNOSIS To check if you have hypertension, your health care provider will measure your blood pressure while you are seated, with your arm held at the level of your heart. It should be measured at least twice using the same arm. Certain conditions can cause a difference in blood pressure between your right and left arms. A blood pressure reading that is higher than normal on one occasion does not mean that you need treatment. If  it is not clear whether you have high blood pressure, you may be asked to return on a different day to have your blood pressure checked again. Or, you may be asked to monitor your blood pressure at home for 1 or more weeks. TREATMENT Treating high blood pressure includes making lifestyle changes and possibly taking medicine. Living a healthy lifestyle can help lower high blood pressure. You may need to change some of your habits. Lifestyle changes may include:  Following the DASH diet. This diet is high in fruits, vegetables, and whole grains. It is low in salt, red meat, and added sugars.  Keep your sodium intake below 2,300 mg per day.  Getting at least 30-45 minutes of aerobic exercise at least 4 times per week.  Losing weight if necessary.  Not smoking.  Limiting alcoholic beverages.  Learning ways to reduce stress. Your health care provider may prescribe medicine if lifestyle changes are not enough to get your blood pressure under control, and if one of the following is true:  You are 18-59 years of age and your systolic blood pressure is above 140.  You are 60 years of age or older, and your systolic blood pressure is above 150.  Your diastolic blood pressure is above 90.  You have diabetes, and your systolic blood pressure is over 140 or your diastolic blood pressure is over 90.  You have kidney disease and your blood pressure is above 140/90.  You have heart disease and your blood pressure is above 140/90. Your personal target blood pressure may vary depending on your medical conditions, your age, and other factors. HOME CARE INSTRUCTIONS    Have your blood pressure rechecked as directed by your health care provider.   Take medicines only as directed by your health care provider. Follow the directions carefully. Blood pressure medicines must be taken as prescribed. The medicine does not work as well when you skip doses. Skipping doses also puts you at risk for  problems.  Do not smoke.   Monitor your blood pressure at home as directed by your health care provider. SEEK MEDICAL CARE IF:   You think you are having a reaction to medicines taken.  You have recurrent headaches or feel dizzy.  You have swelling in your ankles.  You have trouble with your vision. SEEK IMMEDIATE MEDICAL CARE IF:  You develop a severe headache or confusion.  You have unusual weakness, numbness, or feel faint.  You have severe chest or abdominal pain.  You vomit repeatedly.  You have trouble breathing. MAKE SURE YOU:   Understand these instructions.  Will watch your condition.  Will get help right away if you are not doing well or get worse.   This information is not intended to replace advice given to you by your health care provider. Make sure you discuss any questions you have with your health care provider.   Document Released: 02/16/2005 Document Revised: 07/03/2014 Document Reviewed: 12/09/2012 Elsevier Interactive Patient Education 2016 Elsevier Inc.  Pharyngitis Pharyngitis is redness, pain, and swelling (inflammation) of your pharynx.  CAUSES  Pharyngitis is usually caused by infection. Most of the time, these infections are from viruses (viral) and are part of a cold. However, sometimes pharyngitis is caused by bacteria (bacterial). Pharyngitis can also be caused by allergies. Viral pharyngitis may be spread from person to person by coughing, sneezing, and personal items or utensils (cups, forks, spoons, toothbrushes). Bacterial pharyngitis may be spread from person to person by more intimate contact, such as kissing.  SIGNS AND SYMPTOMS  Symptoms of pharyngitis include:   Sore throat.   Tiredness (fatigue).   Low-grade fever.   Headache.  Joint pain and muscle aches.  Skin rashes.  Swollen lymph nodes.  Plaque-like film on throat or tonsils (often seen with bacterial pharyngitis). DIAGNOSIS  Your health care provider will  ask you questions about your illness and your symptoms. Your medical history, along with a physical exam, is often all that is needed to diagnose pharyngitis. Sometimes, a rapid strep test is done. Other lab tests may also be done, depending on the suspected cause.  TREATMENT  Viral pharyngitis will usually get better in 3-4 days without the use of medicine. Bacterial pharyngitis is treated with medicines that kill germs (antibiotics).  HOME CARE INSTRUCTIONS   Drink enough water and fluids to keep your urine clear or pale yellow.   Only take over-the-counter or prescription medicines as directed by your health care provider:   If you are prescribed antibiotics, make sure you finish them even if you start to feel better.   Do not take aspirin.   Get lots of rest.   Gargle with 8 oz of salt water ( tsp of salt per 1 qt of water) as often as every 1-2 hours to soothe your throat.   Throat lozenges (if you are not at risk for choking) or sprays may be used to soothe your throat. SEEK MEDICAL CARE IF:   You have large, tender lumps in your neck.  You have a rash.  You cough up green, yellow-brown, or bloody spit. SEEK IMMEDIATE MEDICAL CARE IF:   Your neck becomes  stiff.  You drool or are unable to swallow liquids.  You vomit or are unable to keep medicines or liquids down.  You have severe pain that does not go away with the use of recommended medicines.  You have trouble breathing (not caused by a stuffy nose). MAKE SURE YOU:   Understand these instructions.  Will watch your condition.  Will get help right away if you are not doing well or get worse.   This information is not intended to replace advice given to you by your health care provider. Make sure you discuss any questions you have with your health care provider.   Document Released: 02/16/2005 Document Revised: 12/07/2012 Document Reviewed: 10/24/2012 Elsevier Interactive Patient Education [employer].

## 2015-08-07 LAB — CULTURE, GROUP A STREP (THRC)

## 2015-08-08 ENCOUNTER — Telehealth: Payer: Self-pay | Admitting: *Deleted

## 2015-08-08 NOTE — ED Notes (Signed)
Post ED Visit - Positive Culture Follow-up: Successful Patient Follow-Up  Culture assessed and recommendations reviewed by: []  Elenor Quinones, Pharm.D. []  Heide Guile, Pharm.D., BCPS []  Parks Neptune, Pharm.D. []  Alycia Rossetti, Pharm.D., BCPS []  [place], Pharm.D., BCPS, AAHIVP []  Legrand Como, Pharm.D., BCPS, AAHIVP [x]  Milus Glazier, Pharm.D. []  Rob Evette Doffing, [place].D.  Positive srep culture  [x]  Patient discharged without antimicrobial prescription and treatment is now indicated []  Organism is resistant to prescribed ED discharge antimicrobial []  Patient with positive blood cultures  Changes discussed with ED provider, Josephina Gip, PA-C New antibiotic prescription Amoxicillin 500 mg PO BID X 10 days Called to [place], [employer].  [phone]  Contacted patient, date 08/08/2015, time [place] 08/08/2015, 10:14 AM

## 2015-08-08 NOTE — Progress Notes (Signed)
ED Antimicrobial Stewardship Positive Culture Follow Up   Harold Clements is an 35 y.o. male who presented to [facility] on 08/05/2015 with a chief complaint of  Chief Complaint  Patient presents with  . Sore Throat    Recent Results (from the past 720 hour(s))  Rapid strep screen     Status: None   Collection Time: 08/05/15 10:31 PM  Result Value Ref Range Status   Streptococcus, Group A Screen (Direct) NEGATIVE NEGATIVE Final    Comment: (NOTE) A Rapid Antigen test may result negative if the antigen level in the sample is below the detection level of this test. The FDA has not cleared this test as a stand-alone test therefore the rapid antigen negative result has reflexed to a Group A Strep culture.   Culture, group A strep     Status: None   Collection Time: 08/05/15 10:31 PM  Result Value Ref Range Status   Specimen Description THROAT  Final   Special Requests NONE Reflexed from [id number]  Final   Culture MODERATE GROUP A STREP (S.PYOGENES) ISOLATED  Final   Report Status 08/07/2015 FINAL  Final     [x]  Patient discharged originally without antimicrobial agent and treatment is now indicated  New antibiotic prescription: Amoxicillin 500 mg PO BID x 10 days  ED Provider: Josephina Gip, PA-C  Cassie L. Nicole Kindred, PharmD PGY2 Infectious Diseases Pharmacy Resident Pager: [phone] 08/08/2015 10:01 AM

## 2015-09-01 ENCOUNTER — Emergency Department (HOSPITAL_COMMUNITY): Payer: BLUE CROSS/BLUE SHIELD

## 2015-09-01 ENCOUNTER — Encounter (HOSPITAL_COMMUNITY): Payer: Self-pay

## 2015-09-01 ENCOUNTER — Emergency Department (HOSPITAL_COMMUNITY)
Admission: EM | Admit: 2015-09-01 | Discharge: 2015-09-01 | Disposition: A | Discharge status: Home / Self Care | Payer: BLUE CROSS/BLUE SHIELD | Attending: Emergency Medicine | Admitting: Emergency Medicine

## 2015-09-01 DIAGNOSIS — S46911A Strain of unspecified muscle, fascia and tendon at shoulder and upper arm level, right arm, initial encounter: Secondary | ICD-10-CM | POA: Insufficient documentation

## 2015-09-01 DIAGNOSIS — Z9114 Patient's other noncompliance with medication regimen: Secondary | ICD-10-CM | POA: Insufficient documentation

## 2015-09-01 DIAGNOSIS — I129 Hypertensive chronic kidney disease with stage 1 through stage 4 chronic kidney disease, or unspecified chronic kidney disease: Secondary | ICD-10-CM | POA: Insufficient documentation

## 2015-09-01 DIAGNOSIS — N189 Chronic kidney disease, unspecified: Secondary | ICD-10-CM | POA: Insufficient documentation

## 2015-09-01 DIAGNOSIS — Y929 Unspecified place or not applicable: Secondary | ICD-10-CM | POA: Insufficient documentation

## 2015-09-01 DIAGNOSIS — Z79899 Other long term (current) drug therapy: Secondary | ICD-10-CM | POA: Insufficient documentation

## 2015-09-01 DIAGNOSIS — X500XXA Overexertion from strenuous movement or load, initial encounter: Secondary | ICD-10-CM | POA: Insufficient documentation

## 2015-09-01 DIAGNOSIS — Y9389 Activity, other specified: Secondary | ICD-10-CM | POA: Insufficient documentation

## 2015-09-01 DIAGNOSIS — Y990 Civilian activity done for income or pay: Secondary | ICD-10-CM | POA: Insufficient documentation

## 2015-09-01 DIAGNOSIS — R739 Hyperglycemia, unspecified: Secondary | ICD-10-CM | POA: Insufficient documentation

## 2015-09-01 LAB — I-STAT CHEM 8, ED
BUN: 19 mg/dL (ref 6–20)
CALCIUM ION: 1.19 mmol/L (ref 1.13–1.30)
CHLORIDE: 103 mmol/L (ref 101–111)
Creatinine, Ser: 1.5 mg/dL — ABNORMAL HIGH (ref 0.61–1.24)
GLUCOSE: 192 mg/dL — AB (ref 65–99)
HCT: 41 % (ref 39.0–52.0)
Hemoglobin: 13.9 g/dL (ref 13.0–17.0)
Potassium: 3.9 mmol/L (ref 3.5–5.1)
SODIUM: 142 mmol/L (ref 135–145)
TCO2: 27 mmol/L (ref 0–100)

## 2015-09-01 MED ORDER — ACETAMINOPHEN 500 MG PO TABS
1000.0000 mg | ORAL_TABLET | Freq: Once | ORAL | Status: AC
  Administered 2015-09-01: 1000 mg via ORAL
  Filled 2015-09-01: qty 2

## 2015-09-01 MED ORDER — BISOPROLOL-HYDROCHLOROTHIAZIDE 2.5-6.25 MG PO TABS: 1.0000 | ORAL_TABLET | Freq: Every day | ORAL | Status: DC

## 2015-09-01 NOTE — ED Notes (Signed)
Pt reports being out of BP medications-no PCP at this time.

## 2015-09-01 NOTE — ED Notes (Signed)
Patient here with right shoulder pain x 1 week, pain with full ROM, states that he does a lot of lifting at work, denies trauma.

## 2015-09-01 NOTE — ED Provider Notes (Signed)
CSN: [id number]     Arrival date & time 09/01/15  1344 History   First MD Initiated Contact with Patient 09/01/15 1440     No chief complaint on file.    (Consider location/radiation/quality/duration/timing/severity/associated sxs/prior Treatment) HPI [place] of right shoulder pain onset 2 days ago pain is overlying right deltoid area worse with lifting and improved with remaining still. No treatment prior to coming here. No chest pain no shortness of breath no other associated symptoms. Past Medical History  Diagnosis Date  . Hypertension   "Borderline diabetic" History reviewed. No pertinent past surgical history. No family history on file. Social History  Substance Use Topics  . Smoking status: Never Smoker   . Smokeless tobacco: Never Used  . Alcohol Use: No    Review of Systems  Constitutional: Negative.   HENT: Negative.   Respiratory: Negative.   Cardiovascular: Negative.   Gastrointestinal: Negative.   Musculoskeletal: Positive for arthralgias.  Skin: Negative.   Neurological: Negative.   Psychiatric/Behavioral: Negative.   All other systems reviewed and are negative.     Allergies  Zithromax  Home Medications   Prior to Admission medications   Medication Sig Start Date End Date Taking? Authorizing Provider  amLODipine (NORVASC) 5 MG tablet Take 1 tablet (5 mg total) by mouth 2 (two) times daily. 08/06/15   Gloriann Loan, PA-C  ciprofloxacin (CIPRO) 500 MG tablet Take 1 tablet (500 mg total) by mouth every 12 (twelve) hours. 10/25/14   Okey Regal, PA-C  ciprofloxacin-dexamethasone (CIPRODEX) otic suspension Place 4 drops into the left ear 2 (two) times daily. For one week Patient not taking: Reported on 10/25/2014 10/07/14   Delice Bison Ward, DO  glimepiride (AMARYL) 1 MG tablet Take 1 tablet (1 mg total) by mouth daily before breakfast. 08/12/14   Delsa Grana, PA-C  hydrochlorothiazide (MICROZIDE) 12.5 MG capsule Take 1 capsule (12.5 mg total) by mouth 2 (two) times  daily. 08/06/15   Gloriann Loan, PA-C  ibuprofen (ADVIL,MOTRIN) 800 MG tablet Take 1 tablet (800 mg total) by mouth every 8 (eight) hours as needed for mild pain. Patient not taking: Reported on 10/25/2014 10/07/14   Delice Bison Ward, DO  traMADol (ULTRAM) 50 MG tablet Take 1 tablet (50 mg total) by mouth every 6 (six) hours as needed. Patient not taking: Reported on 10/25/2014 10/07/14   Kristen N Ward, DO   BP 220/153 mmHg  Pulse 87  Temp(Src) 98.5 F (36.9 C) (Oral)  Resp 18  Ht 5\' 10"  (1.778 m)  Wt 270 lb (122.471 kg)  BMI 38.74 kg/m2  SpO2 99% Physical Exam  Constitutional: He appears well-developed and well-nourished.  HENT:  Head: Normocephalic and atraumatic.  Eyes: Conjunctivae are normal. Pupils are equal, round, and reactive to light.  Neck: Neck supple. No tracheal deviation present. No thyromegaly present.  Cardiovascular: Normal rate and regular rhythm.   No murmur heard. Pulmonary/Chest: Effort normal and breath sounds normal.  Abdominal: Soft. Bowel sounds are normal. He exhibits no distension. There is no tenderness.  Musculoskeletal: Normal range of motion. He exhibits no edema or tenderness.  Right upper extremity no swelling no deformity. Pain at deltoid aero with abduction of shoulder. Full range of motion. Radial pulse 2+. All other extremities without redness or tenderness neurovascularly intact  Neurological: He is alert. Coordination normal.  Skin: Skin is warm and dry. No rash noted.  Psychiatric: He has a normal mood and affect.  Nursing note and vitals reviewed.   ED Course  Procedures (including critical  care time) Labs Review Labs Reviewed - No data to display  Imaging Review Dg Shoulder Right  09/01/2015  CLINICAL DATA:  Right anterior shoulder pain.  No injury. EXAM: RIGHT SHOULDER - 2+ VIEW COMPARISON:  None. FINDINGS: There is no evidence of fracture or dislocation. There is no evidence of arthropathy or other focal bone abnormality. Soft tissues are  unremarkable. IMPRESSION: Negative. Electronically Signed   By: Marin Olp M.D.   On: 09/01/2015 14:11   I have personally reviewed and evaluated these images and lab results as part of my medical decision-making.   EKG Interpretation None     X-ray viewed by me Results for orders placed or performed during the hospital encounter of 09/01/15  I-stat chem 8, ed  Result Value Ref Range   Sodium 142 135 - 145 mmol/L   Potassium 3.9 3.5 - 5.1 mmol/L   Chloride 103 101 - 111 mmol/L   BUN 19 6 - 20 mg/dL   Creatinine, Ser 1.50 (H) 0.61 - 1.24 mg/dL   Glucose, Bld 192 (H) 65 - 99 mg/dL   Calcium, Ion 1.19 1.13 - 1.30 mmol/L   TCO2 27 0 - 100 mmol/L   Hemoglobin 13.9 13.0 - 17.0 g/dL   HCT 41.0 39.0 - 52.0 %   Dg Shoulder Right  09/01/2015  CLINICAL DATA:  Right anterior shoulder pain.  No injury. EXAM: RIGHT SHOULDER - 2+ VIEW COMPARISON:  None. FINDINGS: There is no evidence of fracture or dislocation. There is no evidence of arthropathy or other focal bone abnormality. Soft tissues are unremarkable. IMPRESSION: Negative. Electronically Signed   By: Marin Olp M.D.   On: 09/01/2015 14:11    MDM  I see no signs of hypertensive emergency. Patient has [place]-standing hypertension and medication noncompliance. He does have mild renal insufficiency,, essentially unchanged from one year ago, and is asymptomatic from his blood pressure. Final diagnoses:  None   Plan Tylenol for pain. Prescription bisoprolol-hctz .referral [place] Diagnosis #1 strain of right shoulder #2 hypertension #3 chronic renal insufficiency #4 hyperglycemia #5 medication noncompliance     Orlie Dakin, MD 09/01/15 [phone]

## 2015-09-01 NOTE — Discharge Instructions (Signed)
Muscle Strain Take Tylenol as directed for pain. Your blood pressure today is markedly elevated at 204/140. Start taking the blood pressure medication prescribed today. Call the [facility] tomorrow to arrange to be seen within a week. Get your blood pressure rechecked within a week. Your blood sugar is mildly elevated at 192. Ask the doctor at the [facility] to help you with a diet and exercise program. You're likely diabetic. A muscle strain is an injury that occurs when a muscle is stretched beyond its normal length. Usually a small number of muscle fibers are torn when this happens. Muscle strain is rated in degrees. First-degree strains have the least amount of muscle fiber tearing and pain. Second-degree and third-degree strains have increasingly more tearing and pain.  Usually, recovery from muscle strain takes 1-2 weeks. Complete healing takes 5-6 weeks.  CAUSES  Muscle strain happens when a sudden, violent force placed on a muscle stretches it too far. This may occur with lifting, sports, or a fall.  RISK FACTORS Muscle strain is especially common in athletes.  SIGNS AND SYMPTOMS At the site of the muscle strain, there may be:  Pain.  Bruising.  Swelling.  Difficulty using the muscle due to pain or lack of normal function. DIAGNOSIS  Your health care provider will perform a physical exam and ask about your medical history. TREATMENT  Often, the best treatment for a muscle strain is resting, icing, and applying cold compresses to the injured area.  HOME CARE INSTRUCTIONS   Use the PRICE method of treatment to promote muscle healing during the first 2-3 days after your injury. The PRICE method involves:  Protecting the muscle from being injured again.  Restricting your activity and resting the injured body part.  Icing your injury. To do this, put ice in a plastic bag. Place a towel between your skin and the bag. Then,  apply the ice and leave it on from 15-20 minutes each hour. After the third day, switch to moist heat packs.  Apply compression to the injured area with a splint or elastic bandage. Be careful not to wrap it too tightly. This may interfere with blood circulation or increase swelling.  Elevate the injured body part above the level of your heart as often as you can.  Only take over-the-counter or prescription medicines for pain, discomfort, or fever as directed by your health care provider.  Warming up prior to exercise helps to prevent future muscle strains. SEEK MEDICAL CARE IF:   You have increasing pain or swelling in the injured area.  You have numbness, tingling, or a significant loss of strength in the injured area. MAKE SURE YOU:   Understand these instructions.  Will watch your condition.  Will get help right away if you are not doing well or get worse.   This information is not intended to replace advice given to you by your health care provider. Make sure you discuss any questions you have with your health care provider.   Document Released: 02/16/2005 Document Revised: 12/07/2012 Document Reviewed: 09/15/2012 Elsevier Interactive Patient Education [employer].

## 2015-10-20 ENCOUNTER — Emergency Department (HOSPITAL_COMMUNITY)
Admission: EM | Admit: 2015-10-20 | Discharge: 2015-10-20 | Disposition: A | Discharge status: Home / Self Care | Payer: BLUE CROSS/BLUE SHIELD | Attending: Emergency Medicine | Admitting: Emergency Medicine

## 2015-10-20 ENCOUNTER — Encounter (HOSPITAL_COMMUNITY): Payer: Self-pay

## 2015-10-20 DIAGNOSIS — I10 Essential (primary) hypertension: Secondary | ICD-10-CM | POA: Insufficient documentation

## 2015-10-20 DIAGNOSIS — Z7984 Long term (current) use of oral hypoglycemic drugs: Secondary | ICD-10-CM | POA: Insufficient documentation

## 2015-10-20 DIAGNOSIS — Z79899 Other long term (current) drug therapy: Secondary | ICD-10-CM | POA: Insufficient documentation

## 2015-10-20 DIAGNOSIS — J029 Acute pharyngitis, unspecified: Secondary | ICD-10-CM | POA: Insufficient documentation

## 2015-10-20 LAB — RAPID STREP SCREEN (MED CTR MEBANE ONLY): Streptococcus, Group A Screen (Direct): NEGATIVE

## 2015-10-20 MED ORDER — AMLODIPINE BESYLATE 10 MG PO TABS: 10.0000 mg | ORAL_TABLET | Freq: Every day | ORAL | 0 refills | Status: DC

## 2015-10-20 MED ORDER — PENICILLIN G BENZATHINE 1200000 UNIT/2ML IM SUSP
1.2000 10*6.[IU] | Freq: Once | INTRAMUSCULAR | Status: AC
  Administered 2015-10-20: 1.2 10*6.[IU] via INTRAMUSCULAR
  Filled 2015-10-20: qty 2

## 2015-10-20 MED ORDER — AMLODIPINE BESYLATE 5 MG PO TABS
10.0000 mg | ORAL_TABLET | Freq: Once | ORAL | Status: AC
  Administered 2015-10-20: 10 mg via ORAL
  Filled 2015-10-20: qty 2

## 2015-10-20 MED ORDER — HYDROCHLOROTHIAZIDE 25 MG PO TABS
25.0000 mg | ORAL_TABLET | Freq: Every day | ORAL | Status: DC
  Administered 2015-10-20 (×2): 25 mg via ORAL
  Filled 2015-10-20 (×2): qty 1

## 2015-10-20 MED ORDER — KETOROLAC TROMETHAMINE 60 MG/2ML IM SOLN
60.0000 mg | Freq: Once | INTRAMUSCULAR | Status: AC
  Administered 2015-10-20: 60 mg via INTRAMUSCULAR
  Filled 2015-10-20: qty 2

## 2015-10-20 MED ORDER — HYDROCHLOROTHIAZIDE 25 MG PO TABS: 25.0000 mg | ORAL_TABLET | Freq: Every day | ORAL | 0 refills | Status: DC

## 2015-10-20 MED ORDER — DEXAMETHASONE 4 MG PO TABS
10.0000 mg | ORAL_TABLET | Freq: Once | ORAL | Status: AC
  Administered 2015-10-20: 10 mg via ORAL
  Filled 2015-10-20: qty 3

## 2015-10-20 NOTE — ED Triage Notes (Signed)
Patient here with complaint of sore throat and tonsil swelling x 1 day, hx of same. No distress. Has not taken BP meds this am

## 2015-10-20 NOTE — ED Triage Notes (Signed)
PT BP elevated and Pt reports he did not take his AM meds.

## 2015-10-20 NOTE — ED Notes (Signed)
Declined W/C at D/C and was escorted to lobby by RN. 

## 2015-10-20 NOTE — ED Provider Notes (Signed)
[facility] DEPT Provider Note   CSN: [id number] Arrival date & time: 10/20/15  0713     History   Chief Complaint Chief Complaint  Patient presents with  . Sore Throat    HPI Harold Clements is a 35 y.o. male.  HPI   Patient is a 35 year old male presents emergency department for evaluation of sore throat which began last night. He has pain with swallowing, is able to swallow liquids and solids, has raspy voice  but he denies any muffled speech, fever, chills, sweats, belly pain, headache, vomiting. He has no known sick contacts and denies any recent URI symptoms. He reports a history of frequent sore throats over the past 3-4 years, including strep throat as recently as 2 months ago.   He has never seen a ENT.   History of diagnosis of hypertension and did not take his medications this morning. He was prescribed this in the ER and does not currently have a PCP.  He denies any chest pain, shortness of breath.  Past Medical History:  Diagnosis Date  . Hypertension     There are no active problems to display for this patient.   History reviewed. No pertinent surgical history.     Home Medications    Prior to Admission medications   Medication Sig Start Date End Date Taking? Authorizing Provider  amLODipine (NORVASC) 5 MG tablet Take 1 tablet (5 mg total) by mouth 2 (two) times daily. Patient not taking: Reported on 09/01/2015 08/06/15   Gloriann Loan, PA-C  bisoprolol-hydrochlorothiazide [facility]) 2.5-6.25 MG tablet Take 1 tablet by mouth daily. 09/01/15   Orlie Dakin, MD  glimepiride (AMARYL) 1 MG tablet Take 1 tablet (1 mg total) by mouth daily before breakfast. Patient not taking: Reported on 09/01/2015 08/12/14   Delsa Grana, PA-C  hydrochlorothiazide (MICROZIDE) 12.5 MG capsule Take 1 capsule (12.5 mg total) by mouth 2 (two) times daily. Patient not taking: Reported on 09/01/2015 08/06/15   Gloriann Loan, PA-C  ibuprofen (ADVIL,MOTRIN) 800 MG tablet Take 1 tablet (800 mg total) by  mouth every 8 (eight) hours as needed for mild pain. Patient not taking: Reported on 09/01/2015 10/07/14   Delice Bison Ward, DO  traMADol (ULTRAM) 50 MG tablet Take 1 tablet (50 mg total) by mouth every 6 (six) hours as needed. Patient not taking: Reported on 09/01/2015 10/07/14   Delice Bison Ward, DO    Family History No family history on file.  Social History Social History  Substance Use Topics  . Smoking status: Never Smoker  . Smokeless tobacco: Never Used  . Alcohol use No     Allergies   Zithromax [azithromycin dihydrate]   Review of Systems Review of Systems  All other systems reviewed and are negative.    Physical Exam Updated Vital Signs BP (!) 203/138 (BP Location: Left Arm)   Pulse 99   Temp 99.1 F (37.3 C) (Oral)   Resp 20   Ht 5\' 10"  (1.778 m)   Wt 122.6 kg   SpO2 99%   BMI 38.78 kg/m   Physical Exam  Constitutional: He is oriented to person, place, and time. He appears well-developed and well-nourished. No distress.  HENT:  Head: Normocephalic and atraumatic.  Right Ear: External ear normal.  Left Ear: External ear normal.  Nose: Nose normal.  Mouth/Throat: Uvula is midline and mucous membranes are normal. No trismus in the jaw. No uvula swelling. Oropharyngeal exudate and posterior oropharyngeal erythema present. No tonsillar abscesses. Tonsils are 3+ on the  right. Tonsils are 3+ on the left. Tonsillar exudate.  Eyes: Conjunctivae and EOM are normal. Pupils are equal, round, and reactive to light. Right eye exhibits no discharge. Left eye exhibits no discharge. No scleral icterus.  Neck: Normal range of motion. Neck supple. No JVD present.  Cardiovascular: Normal rate, regular rhythm, normal heart sounds and intact distal pulses.  Exam reveals no gallop and no friction rub.   No murmur heard. Pulmonary/Chest: Effort normal and breath sounds normal. No stridor. No respiratory distress. He has no wheezes. He has no rales. He exhibits no tenderness.  Abdominal:  Soft. Bowel sounds are normal. He exhibits no distension and no mass. There is no tenderness. There is no rebound and no guarding.  Musculoskeletal: Normal range of motion. He exhibits no edema or tenderness.  Lymphadenopathy:    He has no cervical adenopathy.  Neurological: He is alert and oriented to person, place, and time. He has normal reflexes. He displays normal reflexes. No cranial nerve deficit. He exhibits normal muscle tone. Coordination normal.  Skin: Skin is warm and dry. Capillary refill takes less than 2 seconds. No rash noted. He is not diaphoretic. No erythema. No pallor.  Psychiatric: He has a normal mood and affect. His behavior is normal. Judgment and thought content normal.  Nursing note and vitals reviewed.    ED Treatments / Results  Labs (all labs ordered are listed, but only abnormal results are displayed) Labs Reviewed  RAPID STREP SCREEN (NOT AT [facility])    EKG  EKG Interpretation None       Radiology No results found.  Procedures Procedures (including critical care time)  Medications Ordered in ED Medications  penicillin g benzathine (BICILLIN LA) 1200000 UNIT/2ML injection 1.2 Million Units (not administered)  ketorolac (TORADOL) injection 60 mg (not administered)  dexamethasone (DECADRON) tablet 10 mg (not administered)     Initial Impression / Assessment and Plan / ED Course  I have reviewed the triage vital signs and the nursing notes.  Pertinent labs & imaging results that were available during my care of the patient were reviewed by me and considered in my medical decision making (see chart for details).  Clinical Course   Pt with pharyngitis, history of the same with + strep 2 months ago.  Sx began last night.  Suspicious for early strep with erythema and exudate.  Tonsils are bilaterally 3+, with uvula midline, without edema, no concern for PTA.  Will treat with bicillin, toradol and decadron.  The patient appears well-hydrated and  nontoxic in appearance. His blood pressure was significantly elevated upon arrival, 203/138, otherwise vital signs are within normal limits.  He did not take his blood pressure medicine this morning, is asymptomatic, no concern for end organ damage. Will repeat his blood pressure before clearing to discharge him.  Discussed the importance of follow-up and management of his high blood pressure.  Will provide a refill but he understands he needs to follow up and have his blood pressure rechecked within the next week.    The case was discussed with Dr. Billy Fischer who agrees with increased dose on calcium channel blocker and thiazide, and follow up with PCP. The primary nurse contacted case management who talked with the patient regarding follow-up. It appears that he has [employer] and he will need to arrange his own follow-up. He was instructed to call the resources on the discharge summary for assistance at verifying insurance and finding a provider. He is also told to call  the number on his [employer] card. He understands that he needs to have his blood pressure rechecked and have basic labs to check his kidney function and electrolytes.  Final Clinical Impressions(s) / ED Diagnoses   Final diagnoses:  Pharyngitis  Essential hypertension    New Prescriptions Discharge Medication List as of 10/20/2015  9:19 AM    START taking these medications   Details  hydrochlorothiazide (HYDRODIURIL) 25 MG tablet Take 1 tablet (25 mg total) by mouth daily after breakfast., Starting Sun 10/20/2015, Print         Delsa Grana, PA-C 10/22/15 1900    Gareth Morgan, MD 10/24/15 1353

## 2015-10-20 NOTE — ED Triage Notes (Signed)
Pt reports he has had strep once this year.  Attempted to obtain  Rapid strep swab unable to visualize back of throat . Tongue blade used to assist but still unable to collect rapid strep.

## 2015-10-22 LAB — CULTURE, GROUP A STREP (THRC)

## 2016-03-27 ENCOUNTER — Inpatient Hospital Stay
Admit: 2016-03-27 | Discharge: 2016-03-27 | Disposition: A | Discharge status: Home / Self Care | Payer: Self-pay | Attending: Emergency Medicine

## 2016-03-27 DIAGNOSIS — L509 Urticaria, unspecified: Secondary | ICD-10-CM

## 2016-03-27 MED ORDER — PREDNISONE 10 MG TABLETS IN A DOSE PACK
10 mg | ORAL_TABLET | ORAL | 0 refills | Status: AC
Start: 2016-03-27 — End: ?

## 2016-03-27 NOTE — ED Notes (Signed)
Discharge instructions given with verbalization of understanding. Patient accompanied by self. Patient discharged without difficulty.

## 2016-03-27 NOTE — ED Triage Notes (Signed)
Pt to ER due to rash on arms and back and it is itchy. Pt used new soap today.

## 2016-03-27 NOTE — ED Provider Notes (Addendum)
[facility]  Emergency Department Treatment Report        Patient: Maxwell Wilson Age: 36 y.o. Sex: male    Date of Birth: September 24, 1980 Admit Date: 03/27/2016 PCP: None   MRN: [id number]  CSN: [id number]  Attending: No attending physician found   Room: ER45/ER45 Time Dictated: 1:26 PM PA-C: Leamon Arnt, PA     Chief Complaint   Rash    History of Present Illness   HPI Comments:   12:35 PM   36 y.o. male presents to ED C/O a rash to his LT arm and upper back. He states that he lives in [place] and is here doing a job for 2 months. He started to noticed a rash to his LT arm and back today that is very itchy. He has been using hotel soap instead of his normal soap, but has not used any other new detergents, or products. He hasn't taken anything. Patient has not taken his blood pressure medications today. He does not have any chest pain, SOB, difficulty breathing, headaches, or other signs of hypertensive urgency. Patient denies fever, chills, or any other symptoms or complaints.        The history is provided by the patient.       Review of Systems   Review of Systems   Constitutional: Negative for activity change, appetite change, chills, fatigue and fever.   HENT: Negative for congestion, ear pain, rhinorrhea and sore throat.    Eyes: Negative for pain, discharge, redness and itching.   Respiratory: Negative for cough, chest tightness, shortness of breath and wheezing.    Cardiovascular: Negative for chest pain and palpitations.   Gastrointestinal: Negative for abdominal pain, blood in stool, constipation, diarrhea, nausea and vomiting.   Endocrine: Negative for polyuria.   Genitourinary: Negative for discharge, dysuria, flank pain, hematuria, penile pain and testicular pain.   Musculoskeletal: Negative for back pain, joint swelling and neck pain.   Skin: Positive for rash. Negative for wound.   Allergic/Immunologic: Negative for immunocompromised state.    Neurological: Negative for dizziness, weakness, light-headedness, numbness and headaches.   Hematological: Negative for adenopathy.   Psychiatric/Behavioral: Negative for agitation and confusion. The patient is not nervous/anxious.      Past Medical/Surgical History     Past Medical History:   Diagnosis Date   ??? Hypertension      History reviewed. No pertinent surgical history.    Social History     Social History     Social History   ??? Marital status: MARRIED     Spouse name: N/A   ??? Number of children: N/A   ??? Years of education: N/A     Occupational History   ??? Not on file.     Social History Main Topics   ??? Smoking status: Never Smoker   ??? Smokeless tobacco: Never Used   ??? Alcohol use No   ??? Drug use: No   ??? Sexual activity: Not on file     Other Topics Concern   ??? Not on file     Social History Narrative   ??? No narrative on file       Family History   History reviewed. No pertinent family history.    Current Medications     Prior to Admission Medications   Prescriptions Last Dose Informant Patient Reported? Taking?   HYDROCHLOROTHIAZIDE PO   Yes Yes   Sig: Take  by mouth.      Facility-Administered Medications: None  Allergies     Allergies   Allergen Reactions   ??? Azithromycin Other (comments)       Physical Exam     Patient Vitals for the past 8 hrs:   Temp Pulse Resp BP SpO2   03/27/16 1307 - - - (!) 215/155 -   03/27/16 1221 98.2 ??F (36.8 ??C) 89 20 (!) 213/152 100 %       Physical Exam   Constitutional: He is oriented to person, place, and time. He appears well-developed and well-nourished. No distress.   HENT:   Head: Normocephalic and atraumatic.   Right Ear: External ear normal.   Left Ear: External ear normal.   Nose: Nose normal.   Mouth/Throat: Oropharynx is clear and moist. No oropharyngeal exudate.   No rhinorrhea. Normal turbinates. No sinus TTP. Bilateral ear canals are clear and normal. Bilateral TM's are normal. Normal oropharynx, no erythema, or edema, tonsillar hypertrophy, or exudates.        Eyes: Conjunctivae are normal. Right eye exhibits no discharge. Left eye exhibits no discharge.   Neck: Normal range of motion. Neck supple.   Cardiovascular: Normal rate, regular rhythm, normal heart sounds and intact distal pulses.  Exam reveals no friction rub.    No murmur heard.  Pulmonary/Chest: Effort normal and breath sounds normal. No respiratory distress. He has no wheezes. He has no rales. He exhibits no tenderness.   Lymphadenopathy:     He has no cervical adenopathy.   Neurological: He is alert and oriented to person, place, and time.   Skin: Skin is warm and dry. Rash (Erythematous, raised rash in patches to his LT forearm, upper arm, and upper back, no vesicles, no pustules, no papules) noted. He is not diaphoretic.   Psychiatric: He has a normal mood and affect. His behavior is normal. Judgment and thought content normal.   Nursing note and vitals reviewed.      Diagnostic Studies   RESULTS:    No orders to display       Labs Reviewed - No data to display    No results found for this or any previous visit (from the past 12 hour(s)).    MEDICATIONS GIVEN:  Medications - No data to display    Procedures  Procedures    Impression / ED Course / Medical Decision Making   MDM  Number of Diagnoses or Management Options  Elevated blood pressure reading:   Urticaria: minor  Diagnosis management comments: DDx: rash, allergic reaction, urticaria, atopic dermatitis, scabies    Impression: patient is sitting up on stretcher on the phone when I arrived, does not appear to be distressed, has scratch marks to his LT arm    Management Plan: Evaluate and treat rash. Prescribed Prednisone. Advised to stop new soap and go back to his regular soap, to take Benadryl for itching, to F/U with his PCP in [place] if no improvement, and to return to the ER if symptoms worsen. Advised not to every miss blood pressure medication again. Patient was in agreement with discharge plan and discharged in good condition.       Risk of Complications, Morbidity, and/or Mortality  Presenting problems: low  Diagnostic procedures: minimal  Management options: low    Patient Progress  Patient progress: stable      PROGRESS NOTE:   12:35 PM   Initial assessment completed.     DISCHARGE NOTE:  1:26 PM   Maxwell Wilson  results have been reviewed with him.  He  has been counseled regarding his diagnosis, treatment, and plan.  He verbally conveys understanding and agreement of the signs, symptoms, diagnosis, treatment and prognosis and additionally agrees to follow up as discussed.  He also agrees with the care-plan and conveys that all of his questions have been answered.  I have also provided discharge instructions for him that include: educational information regarding their diagnosis and treatment, and list of reasons why they would want to return to the ED prior to their follow-up appointment, should his condition change.     Final Diagnosis     1. Urticaria    2. Elevated blood pressure reading        Disposition     Discharge Medication List as of 03/27/2016  1:19 PM      START taking these medications    Details   predniSONE (STERAPRED DS) 10 mg dose pack See package insert for instructions., Print, Disp-21 Tab, R-0         CONTINUE these medications which have NOT CHANGED    Details   HYDROCHLOROTHIAZIDE PO Take  by mouth., Historical Med              Follow-up Information     Follow up With Details Comments Contact Info    Your PCP in [place]   If no improvement     [facility]  If symptoms worsen [address]  [place] [id number]  [phone]           Discussed patient, findings, and treatments with No attending physician found prior to discharge and he is in agreement with treatment and discharge plan.     Nursing notes have been reviewed by the physician/ advanced practice ??  Clinician.    Leamon ArntDeanna Nicholson Starace PA-C  March 27, 2016    My signature above authenticates this document and my orders, the final ??   diagnosis (es), discharge prescription (s), and instructions in the Epic record.    If you have any questions please contact [phone].

## 2016-03-27 NOTE — ED Notes (Addendum)
Pt c/o growing rash, itching on arms and back.  Pt hypertensive in triage - pt states he forgot to BP meds this morning.

## 2016-08-04 DIAGNOSIS — T63461A Toxic effect of venom of wasps, accidental (unintentional), initial encounter: Secondary | ICD-10-CM

## 2016-08-04 HISTORY — DX: Toxic effect of venom of wasps, accidental (unintentional), initial encounter: T63.461A

## 2016-08-06 ENCOUNTER — Encounter (HOSPITAL_COMMUNITY): Payer: Self-pay | Admitting: Emergency Medicine

## 2016-08-06 ENCOUNTER — Inpatient Hospital Stay (HOSPITAL_COMMUNITY)
Admission: EM | Admit: 2016-08-06 | Discharge: 2016-08-08 | DRG: 305 | Disposition: A | Discharge status: Home / Self Care | Payer: Self-pay | Attending: Internal Medicine | Admitting: Internal Medicine

## 2016-08-06 DIAGNOSIS — M7989 Other specified soft tissue disorders: Secondary | ICD-10-CM

## 2016-08-06 DIAGNOSIS — Z87891 Personal history of nicotine dependence: Secondary | ICD-10-CM

## 2016-08-06 DIAGNOSIS — R739 Hyperglycemia, unspecified: Secondary | ICD-10-CM

## 2016-08-06 DIAGNOSIS — Z881 Allergy status to other antibiotic agents status: Secondary | ICD-10-CM

## 2016-08-06 DIAGNOSIS — Z8249 Family history of ischemic heart disease and other diseases of the circulatory system: Secondary | ICD-10-CM

## 2016-08-06 DIAGNOSIS — E669 Obesity, unspecified: Secondary | ICD-10-CM | POA: Diagnosis present

## 2016-08-06 DIAGNOSIS — I10 Essential (primary) hypertension: Secondary | ICD-10-CM

## 2016-08-06 DIAGNOSIS — Z79899 Other long term (current) drug therapy: Secondary | ICD-10-CM

## 2016-08-06 DIAGNOSIS — Z833 Family history of diabetes mellitus: Secondary | ICD-10-CM

## 2016-08-06 DIAGNOSIS — I152 Hypertension secondary to endocrine disorders: Secondary | ICD-10-CM | POA: Diagnosis present

## 2016-08-06 DIAGNOSIS — R000 Tachycardia, unspecified: Secondary | ICD-10-CM | POA: Diagnosis present

## 2016-08-06 DIAGNOSIS — Z7984 Long term (current) use of oral hypoglycemic drugs: Secondary | ICD-10-CM

## 2016-08-06 DIAGNOSIS — E119 Type 2 diabetes mellitus without complications: Secondary | ICD-10-CM

## 2016-08-06 DIAGNOSIS — I701 Atherosclerosis of renal artery: Secondary | ICD-10-CM | POA: Diagnosis present

## 2016-08-06 DIAGNOSIS — E1159 Type 2 diabetes mellitus with other circulatory complications: Secondary | ICD-10-CM | POA: Diagnosis present

## 2016-08-06 DIAGNOSIS — I160 Hypertensive urgency: Principal | ICD-10-CM | POA: Diagnosis present

## 2016-08-06 DIAGNOSIS — E1165 Type 2 diabetes mellitus with hyperglycemia: Secondary | ICD-10-CM

## 2016-08-06 DIAGNOSIS — T63461A Toxic effect of venom of wasps, accidental (unintentional), initial encounter: Secondary | ICD-10-CM | POA: Diagnosis present

## 2016-08-06 DIAGNOSIS — Z6837 Body mass index (BMI) 37.0-37.9, adult: Secondary | ICD-10-CM

## 2016-08-06 HISTORY — DX: Toxic effect of venom of wasps, accidental (unintentional), initial encounter: T63.461A

## 2016-08-06 LAB — CBC WITH DIFFERENTIAL/PLATELET
BASOS ABS: 0 10*3/uL (ref 0.0–0.1)
Basophils Relative: 0 %
Eosinophils Absolute: 0.2 10*3/uL (ref 0.0–0.7)
Eosinophils Relative: 3 %
HCT: 43.1 % (ref 39.0–52.0)
HEMOGLOBIN: 14.8 g/dL (ref 13.0–17.0)
LYMPHS PCT: 40 %
Lymphs Abs: 2 10*3/uL (ref 0.7–4.0)
MCH: 27.1 pg (ref 26.0–34.0)
MCHC: 34.3 g/dL (ref 30.0–36.0)
MCV: 78.9 fL (ref 78.0–100.0)
Monocytes Absolute: 0.3 10*3/uL (ref 0.1–1.0)
Monocytes Relative: 7 %
Neutro Abs: 2.5 10*3/uL (ref 1.7–7.7)
Neutrophils Relative %: 50 %
PLATELETS: 214 10*3/uL (ref 150–400)
RBC: 5.46 MIL/uL (ref 4.22–5.81)
RDW: 13.1 % (ref 11.5–15.5)
WBC: 4.9 10*3/uL (ref 4.0–10.5)

## 2016-08-06 LAB — RAPID URINE DRUG SCREEN, HOSP PERFORMED
AMPHETAMINES: NOT DETECTED
BENZODIAZEPINES: NOT DETECTED
Barbiturates: NOT DETECTED
Cocaine: NOT DETECTED
OPIATES: NOT DETECTED
Tetrahydrocannabinol: NOT DETECTED

## 2016-08-06 LAB — URINALYSIS, ROUTINE W REFLEX MICROSCOPIC
Bacteria, UA: NONE SEEN
Bilirubin Urine: NEGATIVE
Hgb urine dipstick: NEGATIVE
KETONES UR: NEGATIVE mg/dL
LEUKOCYTES UA: NEGATIVE
Nitrite: NEGATIVE
PROTEIN: 100 mg/dL — AB
Specific Gravity, Urine: 1.02 (ref 1.005–1.030)
Squamous Epithelial / LPF: NONE SEEN
pH: 6 (ref 5.0–8.0)

## 2016-08-06 LAB — BASIC METABOLIC PANEL
ANION GAP: 9 (ref 5–15)
BUN: 15 mg/dL (ref 6–20)
CHLORIDE: 105 mmol/L (ref 101–111)
CO2: 24 mmol/L (ref 22–32)
Calcium: 9 mg/dL (ref 8.9–10.3)
Creatinine, Ser: 1.52 mg/dL — ABNORMAL HIGH (ref 0.61–1.24)
GFR calc Af Amer: >60 mL/min (ref >60)
GFR calc non Af Amer: 59 mL/min — ABNORMAL LOW (ref >60)
Glucose, Bld: 224 mg/dL — ABNORMAL HIGH (ref 65–99)
POTASSIUM: 3.7 mmol/L (ref 3.5–5.1)
SODIUM: 138 mmol/L (ref 135–145)

## 2016-08-06 MED ORDER — ENOXAPARIN SODIUM 40 MG/0.4ML ~~LOC~~ SOLN
40.0000 mg | SUBCUTANEOUS | Status: DC
  Administered 2016-08-06 – 2016-08-07 (×2): 40 mg via SUBCUTANEOUS
  Filled 2016-08-06 (×2): qty 0.4

## 2016-08-06 MED ORDER — PREDNISONE 20 MG PO TABS
40.0000 mg | ORAL_TABLET | Freq: Once | ORAL | Status: AC
  Administered 2016-08-06: 40 mg via ORAL
  Filled 2016-08-06: qty 2

## 2016-08-06 MED ORDER — LABETALOL HCL 5 MG/ML IV SOLN
10.0000 mg | Freq: Once | INTRAVENOUS | Status: AC
  Administered 2016-08-06: 10 mg via INTRAVENOUS
  Filled 2016-08-06: qty 4

## 2016-08-06 MED ORDER — HYDROCHLOROTHIAZIDE 25 MG PO TABS
25.0000 mg | ORAL_TABLET | Freq: Every day | ORAL | Status: DC
  Administered 2016-08-07 – 2016-08-08 (×2): 25 mg via ORAL
  Filled 2016-08-06 (×2): qty 1

## 2016-08-06 MED ORDER — SODIUM CHLORIDE 0.9% FLUSH
3.0000 mL | Freq: Two times a day (BID) | INTRAVENOUS | Status: DC
  Administered 2016-08-06 – 2016-08-08 (×5): 3 mL via INTRAVENOUS

## 2016-08-06 MED ORDER — ACETAMINOPHEN 650 MG RE SUPP: 650.0000 mg | Freq: Four times a day (QID) | RECTAL | Status: DC | PRN

## 2016-08-06 MED ORDER — CLONIDINE HCL 0.2 MG PO TABS
0.2000 mg | ORAL_TABLET | Freq: Once | ORAL | Status: AC
  Administered 2016-08-06: 0.2 mg via ORAL
  Filled 2016-08-06: qty 1

## 2016-08-06 MED ORDER — AMLODIPINE BESYLATE 10 MG PO TABS
10.0000 mg | ORAL_TABLET | Freq: Every day | ORAL | Status: DC
  Administered 2016-08-06 – 2016-08-08 (×3): 10 mg via ORAL
  Filled 2016-08-06: qty 2
  Filled 2016-08-06 (×2): qty 1

## 2016-08-06 MED ORDER — CLONIDINE HCL 0.1 MG PO TABS
0.1000 mg | ORAL_TABLET | Freq: Once | ORAL | Status: AC
  Administered 2016-08-06: 0.1 mg via ORAL
  Filled 2016-08-06: qty 1

## 2016-08-06 MED ORDER — ACETAMINOPHEN 325 MG PO TABS: 650.0000 mg | ORAL_TABLET | Freq: Four times a day (QID) | ORAL | Status: DC | PRN

## 2016-08-06 MED ORDER — DIPHENHYDRAMINE HCL 25 MG PO CAPS
25.0000 mg | ORAL_CAPSULE | Freq: Once | ORAL | Status: AC
  Administered 2016-08-06: 25 mg via ORAL
  Filled 2016-08-06: qty 1

## 2016-08-06 MED ORDER — HYDROCHLOROTHIAZIDE 25 MG PO TABS
25.0000 mg | ORAL_TABLET | Freq: Once | ORAL | Status: AC
  Administered 2016-08-06: 25 mg via ORAL
  Filled 2016-08-06: qty 1

## 2016-08-06 MED ORDER — DIPHENHYDRAMINE HCL 25 MG PO CAPS
25.0000 mg | ORAL_CAPSULE | Freq: Four times a day (QID) | ORAL | Status: DC | PRN
  Administered 2016-08-07: 25 mg via ORAL
  Filled 2016-08-06: qty 1

## 2016-08-06 NOTE — ED Notes (Signed)
Pt states that he was stung by a wasp x 2 days ago.  No noticeable swelling or redness noted to pointer finger on left hand.

## 2016-08-06 NOTE — Progress Notes (Signed)
Spoke with MD Evette Doffing regarding patient's blood pressure elevated on arrival to floor, patient is asymptomatic, no complaints of chest pain, shortness of breathe, headache, visual disturbances, or neurological deficients Neta Mends RN 08-06-2016 3:35 PM

## 2016-08-06 NOTE — ED Notes (Signed)
Pt given ginger ale to drink. 

## 2016-08-06 NOTE — ED Notes (Signed)
Unsuccessful attempt at giving report to [facility].  Told secretary to have her call me back.

## 2016-08-06 NOTE — H&P (Signed)
Date: 08/06/2016               Patient Name:  Harold Clements MRN: [id number]  DOB: [birth year] Age / Sex: 36 y.o., male   PCP: Patient, No Pcp Per         Medical Service: Internal Medicine Teaching Service         Attending Physician: Dr. Evette Doffing, Mallie Mussel, *    First Contact: Dr. Heber [place]  Pager: [phone]  Second Contact: Dr. Posey Pronto  Pager: [phone]       After Hours (After 5p/  First Contact Pager: [phone]  weekends / holidays): Second Contact Pager: [phone]   Chief Complaint: wasp sting  History of Present Illness: [name] Is a 36 year old male with history of hypertension that presents to the ED for a wasp sting that happened on Tuesday 6/5 located to the left hand.  Patient states that the area has progressively become more swollen, painful and itching for the past 2 days. He tried ice and rubbing alcohol to the area with no relief.  He states he did not have Benadryl at home and came to the ED. In the ED he received an oral dose of Benadryl with some benefit in symptoms.   Patient states he has a history of hypertension and takes hydrochlorothiazide, his only home medication, sparingly. He also takes states he was told he had borderline diabetes.    In the ED provider noticed that the patient's blood pressure was elevated. Patient had received amlodipine, hydrochlorothiazide, clonidine and labetalol in the ED with some improvement in blood pressure. Patient states he is in his baseline health today. He denies headache, blurry vision, nausea/vomiting, chest pain, shortness of breath, abdominal pain, or leg swelling Internal medicine teaching service was consulted for hypertensive urgency.  Meds:  Current Meds  Medication Sig  . hydrochlorothiazide (HYDRODIURIL) 25 MG tablet Take 1 tablet (25 mg total) by mouth daily after breakfast.     Allergies: Allergies as of 08/06/2016 - Review Complete 08/06/2016  Allergen Reaction Noted  . Zithromax [azithromycin  dihydrate] Anaphylaxis 05/17/2011   Past Medical History:  Diagnosis Date  . Hypertension     Family History: Mother:  Hypertension, diabetes Father:  Hypertension   Social History: Tobacco: 3 year history of 1-2 cigarillos a day, quit one year ago Alcohol use: Denies Illicit drug use: Denies  Review of Systems: A complete ROS was negative except as per HPI.   Physical Exam: Blood pressure (!) 190/124, pulse 88, temperature 98 F (36.7 C), resp. rate (!) 23, SpO2 94 %. Physical Exam Vitals:   08/06/16 1200 08/06/16 1230 08/06/16 1300 08/06/16 1400  BP: (!) 233/159 (!) 199/118 (!) 167/127 (!) 190/124  Pulse:      Resp: (!) 25 17 (!) 25 (!) 23  Temp:      SpO2:       General: Vital signs reviewed.  Patient is well-developed and well-nourished, in no acute distress and cooperative with exam.  Head: Normocephalic and atraumatic. Eyes: EOMI, conjunctivae normal, no scleral icterus.  Neck: Supple, trachea midline, normal ROM, no JVD, masses, thyromegaly, or carotid bruit present.  Cardiovascular: RRR, S1 normal, S2 normal, no murmurs, gallops, or rubs. Pulmonary/Chest: Clear to auscultation bilaterally, no wheezes, rales, or rhonchi. Abdominal: Soft, non-tender, non-distended, BS +, no masses, organomegaly, or guarding present.  Musculoskeletal: No joint deformities, erythema, or stiffness, ROM full and nontender. Extremities: No lower extremity edema bilaterally,  pulses symmetric and intact bilaterally. No  cyanosis or clubbing. Neurological: A&O x3, Strength is normal and symmetric bilaterally Skin: Warm, dry and intact. Mild swelling on dorsum of left hand near thumb. No rashes or erythema. Psychiatric: Normal mood and affect. speech and behavior is normal. Cognition and memory are normal.   EKG: Sinus tachycardia   Assessment & Plan by Problem: Active Problems:   Hypertensive urgency Patient presents the ED with elevated blood pressure in the 220s/160s. Patient came in  because of a bee sting. He denies vision changes, headaches, chest pain or shortness of breath.  Lab work reveals elevated creatinine but this appears to be chronic with baseline around 1.5.  Sodium and potassium are within normal limits.  Patient does not have a PCP and obtains his medications through ED visits.  He reports sparingly taking hydrochlorothiazide may be 3 times a week. Patient needs a PCP to help manage his current medical conditions and can follow-up in the [facility] on discharge.  Patient likely has essential hypertension that has been undertreated.  He has received amlodipine, hydrochlorothiazide, clonidine and labetalol in the ED with improvement to 180s/120s.  I am not inclined to pursue further aggressive treatment in lowering his blood pressure as per records show he has a history of elevated blood pressures similar to presentation and he is asymptomatic.  I am concerned with signs of kidney disease that this could be fibromuscular dysplasia, although statistically more likely essential hypertension.  Will get an ultrasound of renal arteries to rule this out.  - one dose of clonidine 0.2mg   -BMET - telemetry  - US renal artery   Wasp sting of dorsum of left hand near thumb Patient presents for a wasp sting that happened 2 days ago and received oral Benadryl in the ED with improvement in symptoms. On exam there is mild swelling to the area. Patient's allergic reaction was localized to the left hand.  -Continue to monitor -Benadryl PRN  Hyperglycemia  Patient has elevated glucose on several occasions in the 200s. Patient likely has type 2 diabetes. Will obtain a hemoglobin A1c. Patient does report increased frequency in urination for a while.  UA in the ED showed proteinuria. -Hemoglobin A1c  Dispo: Admit patient to Observation with expected length of stay less than 2 midnights.  Signed: Valinda Party, DO 08/06/2016, 2:23 PM  Pager: [phone]

## 2016-08-06 NOTE — Progress Notes (Signed)
Paged MD regarding pt blood pressure. Awaiting call back

## 2016-08-06 NOTE — ED Provider Notes (Signed)
[facility] DEPT Provider Note   CSN: [id number] Arrival date & time: 08/06/16  6503   History   Chief Complaint Chief Complaint  Patient presents with  . Insect Bite    HPI Harold Clements is a 36 y.o. male.  Patient reports he was stung by a wasp about 2 days ago.  He reports that he initially applied ice and aloe to area.  He notes that swelling started getting worse yesterday morning.  He reports that he did not take any NSAIDs or benadryl for symptoms.  Never had an allergic reaction before.  No h/o allergies.  No fevers, chills, nausea, vomiting, rashes.  Reports left pointer finger was stung and developed swelling in adjacent fingers and hand.  Reports that he has had HTN in the past.  He reports each time he comes to ED he is told his BP is high.  He does not have a PCP but wishes to establish with one for BP control.  He reports both parents with HTN.  Denies CP, SOB, dizziness, visual disturbance, decreased UOP.  Reports he was prescribed HCTZ and has this at home.  His last dose was day before yesterday.  Has not taken Norvasc in some time.  He reports that it often slips his mind to take his meds.     Past Medical History:  Diagnosis Date  . Hypertension     There are no active problems to display for this patient.   History reviewed. No pertinent surgical history.   Home Medications    Prior to Admission medications   Medication Sig Start Date End Date Taking? Authorizing Provider  amLODipine (NORVASC) 10 MG tablet Take 1 tablet (10 mg total) by mouth at bedtime. 10/20/15   Delsa Grana, PA-C  glimepiride (AMARYL) 1 MG tablet Take 1 tablet (1 mg total) by mouth daily before breakfast. Patient not taking: Reported on 09/01/2015 08/12/14   Delsa Grana, PA-C  hydrochlorothiazide (HYDRODIURIL) 25 MG tablet Take 1 tablet (25 mg total) by mouth daily after breakfast. 10/20/15   Delsa Grana, PA-C  ibuprofen (ADVIL,MOTRIN) 800 MG tablet Take 1 tablet (800 mg total) by mouth  every 8 (eight) hours as needed for mild pain. Patient not taking: Reported on 09/01/2015 10/07/14   Ward, Delice Bison, DO  traMADol (ULTRAM) 50 MG tablet Take 1 tablet (50 mg total) by mouth every 6 (six) hours as needed. Patient not taking: Reported on 09/01/2015 10/07/14   Ward, Delice Bison, DO    Family History No family history on file.  Social History Social History  Substance Use Topics  . Smoking status: Never Smoker  . Smokeless tobacco: Never Used  . Alcohol use No    Allergies   Zithromax [azithromycin dihydrate]   Review of Systems Review of Systems  Constitutional: Negative for activity change, appetite change, chills, diaphoresis, fatigue and fever.  HENT: Negative for trouble swallowing and voice change.   Eyes: Negative for photophobia and visual disturbance.  Respiratory: Negative for chest tightness and shortness of breath.   Cardiovascular: Negative for chest pain, palpitations and leg swelling.  Gastrointestinal: Negative for abdominal pain, nausea and vomiting.  Genitourinary: Negative for decreased urine volume, difficulty urinating and dysuria.  Musculoskeletal: Negative for arthralgias and myalgias.  Skin: Positive for rash (swelling of left hand).  Allergic/Immunologic: Negative for environmental allergies and food allergies.  Neurological: Negative for dizziness, speech difficulty, weakness, numbness and headaches.    Physical Exam Updated Vital Signs BP (!) 220/140 (BP Location: Left  Arm)   Pulse 88   Temp 98 F (36.7 C)   Resp (!) 22   SpO2 94%   Physical Exam  Constitutional: He is oriented to person, place, and time. He appears well-developed and well-nourished. No distress.  HENT:  Head: Normocephalic and atraumatic.  Mouth/Throat: Oropharynx is clear and moist.  Eyes: Conjunctivae and EOM are normal. Pupils are equal, round, and reactive to light. No scleral icterus.  Neck: Normal range of motion. Neck supple.  Cardiovascular: Normal rate,  regular rhythm, normal heart sounds and intact distal pulses.   No murmur heard. Pulmonary/Chest: Effort normal and breath sounds normal. No respiratory distress.  Abdominal: Soft. Bowel sounds are normal. He exhibits no distension. There is no tenderness.  Obese  Musculoskeletal:       Left hand: He exhibits decreased range of motion (slight loss of AROM 2/2 swelling of first 3 digits. sensation in tact. good cap refill. no tendernss to hand or joints. small vesicle on inner aspect of first digit.  swelling extends to the level of the thenar eminence of the hand) and swelling. He exhibits no tenderness, no bony tenderness and normal capillary refill. Normal sensation noted. Normal strength noted.  Neurological: He is alert and oriented to person, place, and time.  Skin: Skin is warm and dry. Capillary refill takes less than 2 seconds. He is not diaphoretic.  Psychiatric: He has a normal mood and affect. His behavior is normal. Judgment and thought content normal.  Nursing note and vitals reviewed.  ED Treatments / Results  Labs (all labs ordered are listed, but only abnormal results are displayed) Labs Reviewed  BASIC METABOLIC PANEL  CBC WITH DIFFERENTIAL/PLATELET  URINALYSIS, ROUTINE W REFLEX MICROSCOPIC  RAPID URINE DRUG SCREEN, HOSP PERFORMED    EKG  EKG Interpretation None      Radiology No results found.  Procedures Procedures (including critical care time)  Medications Ordered in ED Medications  hydrochlorothiazide (HYDRODIURIL) tablet 25 mg (not administered)  amLODipine (NORVASC) tablet 10 mg (not administered)  cloNIDine (CATAPRES) tablet 0.1 mg (not administered)  diphenhydrAMINE (BENADRYL) capsule 25 mg (not administered)  predniSONE (DELTASONE) tablet 40 mg (not administered)    Initial Impression / Assessment and Plan / ED Course  I have reviewed the triage vital signs and the nursing notes.  Pertinent labs & imaging results that were available during my  care of the patient were reviewed by me and considered in my medical decision making (see chart for details).    0940: Discussed care with Dr Johnney Killian who recommends BMP, CBC, EKG, UA, Utox, Norvasc 10mg , Clonidine 0.1mg , HCTZ 25mg .  Prednisone 40mg , Benadryl 25mg  PO also ordered.  1015: Updated patient.  Labs obtained, awaiting medications from pharmacy.  Awaiting EKG.  BP persistently elevated.  Patient asx.  Patient voices good understanding of plan.  1043: no evidence of MI on EKG.  Though some nonspecific changes seen.  No prior to compare.  S/p Oral antihypertensives.  CBC normal. BMP, UA, Utox pending.  1054: BMP with stable Cr at 1.52 (baseline looks to be around 1.5).  No evidence of end organ damage.  BP persistently elevated.  Will obtain a manual BP on left UE.  Discussed with bedside RN.  1140: Persistently elevated BP to 220/140 despite HCTZ, Norvasc, Clonidine.  Labetalol 10mg  IV ordered.  Will call hospitalist to discuss admission.   1159: Spoke to Internal Medicine Teaching Service, who agree to admission.  Appreciate IMTS assistance with this patient.  Final Clinical Impressions(s) /  ED Diagnoses   Final diagnoses:  Wasp sting, accidental or unintentional, initial encounter  Hypertensive urgency   Harold Clements is a 36 y.o. male that presented to ED for symptomatic wasp sting.  He has no evidence of cellulitis on exam.  No pain.  Treated with 1 dose of Prednisone and Benadryl in ED.  He was subsequently found to have hypertensive urgency with no evidence of end organ damage.  He was totally asymptomatic but BP could not be safely decreased during time in ED, despite administration of Norvasc 10, HCTZ 25, Clonidine 0.1mg .  For this reason, hospitalist called for observation/ safe BP reduction.  Internal Medicine Teaching Service to see and admit.  New Prescriptions New Prescriptions   No medications on file     Janora Norlander, DO 08/06/16 1202    Charlesetta Shanks,  MD 08/10/16 1155

## 2016-08-06 NOTE — ED Triage Notes (Signed)
Pt reports being stung by wasp two days ago on left hand, swelling and itching present to site now. No known allergies to insects

## 2016-08-07 ENCOUNTER — Observation Stay (HOSPITAL_COMMUNITY): Payer: Self-pay

## 2016-08-07 DIAGNOSIS — I160 Hypertensive urgency: Principal | ICD-10-CM

## 2016-08-07 DIAGNOSIS — E669 Obesity, unspecified: Secondary | ICD-10-CM

## 2016-08-07 DIAGNOSIS — E119 Type 2 diabetes mellitus without complications: Secondary | ICD-10-CM

## 2016-08-07 DIAGNOSIS — I10 Essential (primary) hypertension: Secondary | ICD-10-CM

## 2016-08-07 LAB — BASIC METABOLIC PANEL
ANION GAP: 12 (ref 5–15)
BUN: 18 mg/dL (ref 6–20)
CHLORIDE: 99 mmol/L — AB (ref 101–111)
CO2: 25 mmol/L (ref 22–32)
Calcium: 9.2 mg/dL (ref 8.9–10.3)
Creatinine, Ser: 1.61 mg/dL — ABNORMAL HIGH (ref 0.61–1.24)
GFR calc non Af Amer: 55 mL/min — ABNORMAL LOW (ref >60)
Glucose, Bld: 224 mg/dL — ABNORMAL HIGH (ref 65–99)
POTASSIUM: 3.4 mmol/L — AB (ref 3.5–5.1)
SODIUM: 136 mmol/L (ref 135–145)

## 2016-08-07 LAB — HEMOGLOBIN A1C
HEMOGLOBIN A1C: 9.4 % — AB (ref 4.8–5.6)
Mean Plasma Glucose: 223 mg/dL

## 2016-08-07 LAB — HIV ANTIBODY (ROUTINE TESTING W REFLEX): HIV Screen 4th Generation wRfx: NONREACTIVE

## 2016-08-07 MED ORDER — HYDROCHLOROTHIAZIDE 25 MG PO TABS: 25.0000 mg | ORAL_TABLET | Freq: Every day | ORAL | 1 refills | Status: DC

## 2016-08-07 MED ORDER — AMLODIPINE BESYLATE 5 MG PO TABS: 10.0000 mg | ORAL_TABLET | Freq: Every day | ORAL | 1 refills | Status: DC

## 2016-08-07 MED ORDER — HYDRALAZINE HCL 20 MG/ML IJ SOLN
5.0000 mg | INTRAMUSCULAR | Status: DC | PRN
  Administered 2016-08-08: 5 mg via INTRAVENOUS
  Filled 2016-08-07: qty 1

## 2016-08-07 MED ORDER — AMLODIPINE BESYLATE 10 MG PO TABS: 10.0000 mg | ORAL_TABLET | Freq: Every day | ORAL | 1 refills | Status: DC

## 2016-08-07 MED ORDER — LISINOPRIL 10 MG PO TABS
10.0000 mg | ORAL_TABLET | Freq: Every day | ORAL | Status: DC
  Administered 2016-08-07 – 2016-08-08 (×2): 10 mg via ORAL
  Filled 2016-08-07 (×2): qty 1

## 2016-08-07 MED ORDER — POTASSIUM CHLORIDE CRYS ER 20 MEQ PO TBCR
40.0000 meq | EXTENDED_RELEASE_TABLET | Freq: Once | ORAL | Status: AC
  Administered 2016-08-07: 40 meq via ORAL
  Filled 2016-08-07: qty 2

## 2016-08-07 MED ORDER — METFORMIN HCL 500 MG PO TABS: 500.0000 mg | ORAL_TABLET | Freq: Two times a day (BID) | ORAL | 1 refills | Status: DC

## 2016-08-07 NOTE — Progress Notes (Signed)
MD made aware of elevated blood pressure. No new orders given at this time.

## 2016-08-07 NOTE — Progress Notes (Signed)
*  PRELIMINARY RESULTS* Vascular Ultrasound Renal Artery Duplex has been completed. No obvious signs of renal artery stenosis bilaterally.  Everrett Coombe 08/07/2016, 4:11 PM

## 2016-08-07 NOTE — Progress Notes (Signed)
Patients BP still elevated. BP checked several times manually and automatic on both arms. Paged MD on call [facility]. PRN Hydralazine 5 mg given for SBP >200 or DBP >120 only, since patient has been with increased BP for a while, and plan is not to drop patients blood pressure too fast.  Will continue to monitor.  Ecko Beasley, RN

## 2016-08-07 NOTE — Care Management Note (Signed)
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 No PCP, patient is agreeable to have CM assist him to fine a PCP - apt made with [facility] for August 11, 2016 at 2:30 pm with Dr Betty Martinique. Pharmacy of choice is [facility]. CM also talked to patient about the importance of taking his medication; patient was taking his medication every other day trying to stretch it out.   Expected Discharge Date:   possibly 08/08/2016               Expected Discharge Plan:  Home/Self Care  Discharge planning Services  CM Consult, Follow-up appt scheduled  Status of Service:  In process, will continue to follow  Sherrilyn Rist [phone] 08/07/2016, 9:53 AM
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
## 2016-08-07 NOTE — Progress Notes (Signed)
Patient's blood pressure is 203/145; asymtomatic. MD made aware. MD states will come visit patient and place new orders. Rapid response notified also due to patient's increased blood pressures.

## 2016-08-07 NOTE — Progress Notes (Addendum)
Lisinopril given per order. Per MD, will not order IV blood pressure medication at this time to avoid decreasing BP rapidly. Rapid response made aware.

## 2016-08-07 NOTE — Progress Notes (Signed)
Dr. Heber Arapaho made aware of BP elevation. States that patient has been with increased BP for a while and to not drop BP too fast. States that plan of care is stable. Patient continues to be asymptomatic.

## 2016-08-08 DIAGNOSIS — I152 Hypertension secondary to endocrine disorders: Secondary | ICD-10-CM

## 2016-08-08 DIAGNOSIS — E1159 Type 2 diabetes mellitus with other circulatory complications: Secondary | ICD-10-CM

## 2016-08-08 MED ORDER — LISINOPRIL 20 MG PO TABS: 20.0000 mg | ORAL_TABLET | Freq: Every day | ORAL | 1 refills | Status: DC

## 2016-08-08 MED ORDER — METFORMIN HCL 500 MG PO TABS: 500.0000 mg | ORAL_TABLET | Freq: Two times a day (BID) | ORAL | 1 refills | Status: DC

## 2016-08-08 MED ORDER — HYDROCHLOROTHIAZIDE 25 MG PO TABS: 25.0000 mg | ORAL_TABLET | Freq: Every day | ORAL | 1 refills | Status: DC

## 2016-08-08 MED ORDER — AMLODIPINE BESYLATE 10 MG PO TABS: 10.0000 mg | ORAL_TABLET | Freq: Every day | ORAL | 1 refills | Status: DC

## 2016-08-08 NOTE — Discharge Instructions (Signed)
We discussed, your blood pressure is not controlled.  You are being discharged with prescriptions for Amlodipine 10 mg once daily, Hydrochlorothiazide (HCTZ) 25 mg once daily, and Lisinopril 20 mg once daily.  You can take these at the same time each morning.  Blood work shows that you do have diabetes. Your Hemoglobin A1c was 9.4% (normal 4.8 - 5.6%). We will start a medication called Metformin. Take this as follows: -Metformin 500 mg once a day for 1 week then, -Metformin 500 mg twice a day for 1 week then, -Metformin 1000 mg in the morning and 500 mg in the evening for 1 week then, - Metformin 1000 mg twice a day    DASH Eating Plan DASH stands for "Dietary Approaches to Stop Hypertension." The DASH eating plan is a healthy eating plan that has been shown to reduce high blood pressure (hypertension). It may also reduce your risk for type 2 diabetes, heart disease, and stroke. The DASH eating plan may also help with weight loss. What are tips for following this plan? General guidelines  Avoid eating more than 2,300 mg (milligrams) of salt (sodium) a day. If you have hypertension, you may need to reduce your sodium intake to 1,500 mg a day.  Limit alcohol intake to no more than 1 drink a day for nonpregnant women and 2 drinks a day for men. One drink equals 12 oz of beer, 5 oz of wine, or 1 oz of hard liquor.  Work with your health care provider to maintain a healthy body weight or to lose weight. Ask what an ideal weight is for you.  Get at least 30 minutes of exercise that causes your heart to beat faster (aerobic exercise) most days of the week. Activities may include walking, swimming, or biking.  Work with your health care provider or diet and nutrition specialist (dietitian) to adjust your eating plan to your individual calorie needs. Reading food labels  Check food labels for the amount of sodium per serving. Choose foods with less than 5 percent of the Daily Value of sodium.  Generally, foods with less than 300 mg of sodium per serving fit into this eating plan.  To find whole grains, look for the word "whole" as the first word in the ingredient list. Shopping  Buy products labeled as "low-sodium" or "no salt added."  Buy fresh foods. Avoid canned foods and premade or frozen meals. Cooking  Avoid adding salt when cooking. Use salt-free seasonings or herbs instead of table salt or sea salt. Check with your health care provider or pharmacist before using salt substitutes.  Do not fry foods. Cook foods using healthy methods such as baking, boiling, grilling, and broiling instead.  Cook with heart-healthy oils, such as olive, canola, soybean, or sunflower oil. Meal planning   Eat a balanced diet that includes: ? 5 or more servings of fruits and vegetables each day. At each meal, try to fill half of your plate with fruits and vegetables. ? Up to 6-8 servings of whole grains each day. ? Less than 6 oz of lean meat, poultry, or fish each day. A 3-oz serving of meat is about the same size as a deck of cards. One egg equals 1 oz. ? 2 servings of low-fat dairy each day. ? A serving of nuts, seeds, or beans 5 times each week. ? Heart-healthy fats. Healthy fats called Omega-3 fatty acids are found in foods such as flaxseeds and coldwater fish, like sardines, salmon, and mackerel.  Limit how  much you eat of the following: ? Canned or prepackaged foods. ? Food that is high in trans fat, such as fried foods. ? Food that is high in saturated fat, such as fatty meat. ? Sweets, desserts, sugary drinks, and other foods with added sugar. ? Full-fat dairy products.  Do not salt foods before eating.  Try to eat at least 2 vegetarian meals each week.  Eat more home-cooked food and less restaurant, buffet, and fast food.  When eating at a restaurant, ask that your food be prepared with less salt or no salt, if possible. What foods are recommended? The items listed may  not be a complete list. Talk with your dietitian about what dietary choices are best for you. Grains Whole-grain or whole-wheat bread. Whole-grain or whole-wheat pasta. Brown rice. Modena Morrow. Bulgur. Whole-grain and low-sodium cereals. Pita bread. Low-fat, low-sodium crackers. Whole-wheat flour tortillas. Vegetables Fresh or frozen vegetables (raw, steamed, roasted, or grilled). Low-sodium or reduced-sodium tomato and vegetable juice. Low-sodium or reduced-sodium tomato sauce and tomato paste. Low-sodium or reduced-sodium canned vegetables. Fruits All fresh, dried, or frozen fruit. Canned fruit in natural juice (without added sugar). Meat and other protein foods Skinless chicken or [place]. Ground chicken or [place]. Pork with fat trimmed off. Fish and seafood. Egg whites. Dried beans, peas, or lentils. Unsalted nuts, nut butters, and seeds. Unsalted canned beans. Lean cuts of beef with fat trimmed off. Low-sodium, lean deli meat. Dairy Low-fat (1%) or fat-free (skim) milk. Fat-free, low-fat, or reduced-fat cheeses. Nonfat, low-sodium ricotta or cottage cheese. Low-fat or nonfat yogurt. Low-fat, low-sodium cheese. Fats and oils Soft margarine without trans fats. Vegetable oil. Low-fat, reduced-fat, or light mayonnaise and salad dressings (reduced-sodium). Canola, safflower, olive, soybean, and sunflower oils. Avocado. Seasoning and other foods Herbs. Spices. Seasoning mixes without salt. Unsalted popcorn and pretzels. Fat-free sweets. What foods are not recommended? The items listed may not be a complete list. Talk with your dietitian about what dietary choices are best for you. Grains Baked goods made with fat, such as croissants, muffins, or some breads. Dry pasta or rice meal packs. Vegetables Creamed or fried vegetables. Vegetables in a cheese sauce. Regular canned vegetables (not low-sodium or reduced-sodium). Regular canned tomato sauce and paste (not low-sodium or reduced-sodium).  Regular tomato and vegetable juice (not low-sodium or reduced-sodium). Angie Fava. Olives. Fruits Canned fruit in a light or heavy syrup. Fried fruit. Fruit in cream or butter sauce. Meat and other protein foods Fatty cuts of meat. Ribs. Fried meat. Berniece Salines. Sausage. Bologna and other processed lunch meats. Salami. Fatback. Hotdogs. Bratwurst. Salted nuts and seeds. Canned beans with added salt. Canned or smoked fish. Whole eggs or egg yolks. Chicken or [place] with skin. Dairy Whole or 2% milk, cream, and half-and-half. Whole or full-fat cream cheese. Whole-fat or sweetened yogurt. Full-fat cheese. Nondairy creamers. Whipped toppings. Processed cheese and cheese spreads. Fats and oils Butter. Stick margarine. Lard. Shortening. Ghee. Bacon fat. Tropical oils, such as coconut, palm kernel, or palm oil. Seasoning and other foods Salted popcorn and pretzels. Onion salt, garlic salt, seasoned salt, table salt, and sea salt. Worcestershire sauce. Tartar sauce. Barbecue sauce. Teriyaki sauce. Soy sauce, including reduced-sodium. Steak sauce. Canned and packaged gravies. Fish sauce. Oyster sauce. Cocktail sauce. Horseradish that you find on the shelf. Ketchup. Mustard. Meat flavorings and tenderizers. Bouillon cubes. Hot sauce and Tabasco sauce. Premade or packaged marinades. Premade or packaged taco seasonings. Relishes. Regular salad dressings. Where to find more information:  National Heart, Lung, and Blood Institute: https://wilson-eaton.com/  American Heart Association: www.heart.org Summary  The DASH eating plan is a healthy eating plan that has been shown to reduce high blood pressure (hypertension). It may also reduce your risk for type 2 diabetes, heart disease, and stroke.  With the DASH eating plan, you should limit salt (sodium) intake to 2,300 mg a day. If you have hypertension, you may need to reduce your sodium intake to 1,500 mg a day.  When on the DASH eating plan, aim to eat more fresh fruits and  vegetables, whole grains, lean proteins, low-fat dairy, and heart-healthy fats.  Work with your health care provider or diet and nutrition specialist (dietitian) to adjust your eating plan to your individual calorie needs. This information is not intended to replace advice given to you by your health care provider. Make sure you discuss any questions you have with your health care provider. Document Released: 02/05/2011 Document Revised: 02/10/2016 Document Reviewed: 02/10/2016 Elsevier Interactive Patient Education  2017 [employer].

## 2016-08-08 NOTE — Discharge Summary (Signed)
Name: Harold Clements MRN: [id number] DOB: Dec 14, 1980 36 y.o. PCP: Patient, No Pcp Per  Date of Admission: 08/06/2016  8:45 AM Date of Discharge: 08/08/2016 Attending Physician: Aldine Contes, MD  Discharge Diagnosis: 1. Hypertensive Urgency Principal Problem:   Hypertension associated with diabetes [facility]) Active Problems:   Diabetes ([place])  ^^^ NOTE: Medication list below contains an error. Amlodipine and Hydrochlorothiazide are listed twice in error. Patient should only take Amlodipine once daily and HCTZ once daily. I discussed this with nurse prior to discharge who educated patient to take his antihypertensives as follows: Amlodipine 10 mg once daily, HCTZ 25 mg once daily, and Lisinopril 20 mg once daily.^^^  Discharge Medications: Allergies as of 08/08/2016      Reactions   Zithromax [azithromycin Dihydrate] Anaphylaxis      Medication List    STOP taking these medications   glimepiride 1 MG tablet Commonly known as:  AMARYL   ibuprofen 800 MG tablet Commonly known as:  ADVIL,MOTRIN     TAKE these medications   amLODipine 10 MG tablet Commonly known as:  NORVASC Take 1 tablet (10 mg total) by mouth at bedtime. What changed:  Another medication with the same name was added. Make sure you understand how and when to take each.   amLODipine 10 MG tablet Commonly known as:  NORVASC Take 1 tablet (10 mg total) by mouth daily. What changed:  You were already taking a medication with the same name, and this prescription was added. Make sure you understand how and when to take each.   hydrochlorothiazide 25 MG tablet Commonly known as:  HYDRODIURIL Take 1 tablet (25 mg total) by mouth daily after breakfast. What changed:  Another medication with the same name was added. Make sure you understand how and when to take each.   hydrochlorothiazide 25 MG tablet Commonly known as:  HYDRODIURIL Take 1 tablet (25 mg total) by mouth daily. Start taking on:  08/09/2016 What  changed:  You were already taking a medication with the same name, and this prescription was added. Make sure you understand how and when to take each.   lisinopril 20 MG tablet Commonly known as:  PRINIVIL,ZESTRIL Take 1 tablet (20 mg total) by mouth daily. Start taking on:  08/09/2016   metFORMIN 500 MG tablet Commonly known as:  GLUCOPHAGE Take 1 tablet (500 mg total) by mouth 2 (two) times daily with a meal.   traMADol 50 MG tablet Commonly known as:  ULTRAM Take 1 tablet (50 mg total) by mouth every 6 (six) hours as needed.       Disposition and follow-up:   [name] was discharged from [facility] in Good condition.  At the hospital follow up visit please address:  1.   Hypertension: Adherence to Amlodipine 10 mg qd, HCTZ 25 mg qd, Lisinopril 20 mg qd. Titrate up as needed.  Diabetes: Adherence and tolerance to Metformin. Titrate up to 1000 mg BID as able.  2.  Labs / imaging needed at time of follow-up: BMET  3.  Pending labs/ test needing follow-up: Offical Renal Artery Duplex report  Follow-up Appointments: Follow-up [place] Follow up on 08/17/2016.   Why:  9:15am on 08/17/16 Contact information: [address] [place] [facility] Course by problem list: Principal Problem:   Hypertension associated with diabetes [facility]) Active Problems:   Diabetes ([place])  Hypertensive Urgency: Patient severely hypertensive to 260/160s on presentation to the ED. He had no signs of end-organ damage or symptoms CP, dyspnea, no headache, no nausea or vomiting. Blood pressures were noted to be significantly elevated as documented in EPIC over the last few years. He had inconsistently taken Amlodipine and HCTZ. His continued hypertension was suspected to be secondary to inadequate medical management/adherence. Renal artery duplex was negative for stenosis per preliminary  read. Antihypertensives were restarted. BPs slowly improved to 150-180/90-110s. He was discharged on Amlodipine 10 mg once daily, HCTZ 25 mg once daily, and Lisinopril 20 mg once daily.  Type 2 diabetes: Patient's hemoglobin A1c was 9.4; this is a new diagnosis for patient. Metformin was prescribed on discharge with instructions to titrate up to 1000 mg BID.  Wasp sting of dorsum of left hand near thumb Patient presented to the ED for a wasp sting that occurred 2 days prior and received oral Benadryl and Prednisone 40 mg once in the ED with improvement in symptoms. Patient's allergic reaction (swelling) was localized to the left hand without weakness or neurological deficit and resolved day of admission.  Discharge Vitals:   BP (!) 177/116 (BP Location: Right Arm)   Pulse 91   Temp 98.4 F (36.9 C) (Oral)   Resp 20   Ht 5\' 10"  (1.778 m)   Wt 262 lb 3.2 oz (118.9 kg)   SpO2 94%   BMI 37.62 kg/m   Pertinent Labs, Studies, and Procedures:  - Hgb A1c 9.4 - UDS negative - HIV non reactive  Discharge Instructions: Discharge Instructions    Call MD for:  difficulty breathing, headache or visual disturbances    Complete by:  As directed    Call MD for:  hives    Complete by:  As directed    Call MD for:  persistant dizziness or light-headedness    Complete by:  As directed    Call MD for:  persistant nausea and vomiting    Complete by:  As directed    Call MD for:  severe uncontrolled pain    Complete by:  As directed    Call MD for:  temperature >100.4    Complete by:  As directed    Diet - low sodium heart healthy    Complete by:  As directed    Diet - low sodium heart healthy    Complete by:  As directed    Discharge instructions    Complete by:  As directed    [name],  It was a pleasure taking care of you while in the hospital. Please start taking amlodipine and hydrochlorothiazide once a day and metformin twice a day. Please follow up in the internal medicine clinic  on June 18 at 9:15 AM.  This will give some time for the medications to start working to see if there needs to be adjustments during your follow-up visit.   Discharge instructions    Complete by:  As directed    Monitor Blood Pressure and adherence to antihypertensives and Metformin. Recheck BMET.   Increase activity slowly    Complete by:  As directed    Increase activity slowly    Complete by:  As directed       Signed: Zada Finders, MD 08/08/2016, 2:54 PM

## 2016-08-08 NOTE — Progress Notes (Signed)
   Subjective:  Patient feels well this morning. He denies any headache, change in vision, N/V, chest pain or shortness of breath.   Objective:  Vital signs in last 24 hours: Vitals:   08/08/16 0204 08/08/16 0515 08/08/16 0841 08/08/16 0843  BP: (!) 173/116 (!) 174/95  (!) 159/106  Pulse: 79 68 84   Resp:  18 18   Temp:  98.1 F (36.7 C)    TempSrc:  Oral    SpO2:  97% 97%   Weight:  262 lb 3.2 oz (118.9 kg)    Height:       General: resting in bed, no acute distress Cardiac: RRR, no rubs, murmurs or gallops Pulm: clear to auscultation bilaterally, moving normal volumes of air Abd: soft, nontender, nondistended, BS present Ext: warm and well perfused, no pedal edema Neuro: alert and oriented X3   Assessment/Plan:  Principal Problem:   Hypertension associated with diabetes ([place]) Active Problems:   Diabetes ([place])  Hypertension: This has been a chronic issue over the last few years. I think he has not been adequately medically managed and has only incosistently taken anti-hypertensives. He is asymptomatic and otherwise stable for discharge today with follow up in the Internal Medicine Clinic. Renal Artery Duplex is negative for renal artery stenosis.  - Discharge on Amlodipine 10 mg daily, HCTZ 25 mg daily, and Lisinopril 20 mg daily - Will send Rx to [facility] - f/u in [facility] on 6/18 @ 9:15 am  Type 2 diabetes Patient's hemoglobin A1c was 9.4.  Will start patient on metformin on discharge.  Patient will have close follow-up in the internal medicine clinic for further adjustments of medications. - Discharge with metformin 500 mg daily; titrate up to 1000 mg BID  Dispo: Anticipated discharge today.   Zada Finders, MD 08/08/2016, 11:30 AM

## 2016-08-08 NOTE — Progress Notes (Signed)
Pt discharged home, Refused wheelchair out insisted on walking NT accompanied pt. IV and tele removed. Discharge instructions given and reviewed. Questions answered.

## 2016-08-08 NOTE — Progress Notes (Signed)
MD made aware of BP. Still Discharge pt home.

## 2016-08-11 ENCOUNTER — Ambulatory Visit: Payer: BLUE CROSS/BLUE SHIELD | Admitting: Family Medicine

## 2016-08-12 ENCOUNTER — Ambulatory Visit: Payer: BLUE CROSS/BLUE SHIELD

## 2016-08-17 ENCOUNTER — Ambulatory Visit (INDEPENDENT_AMBULATORY_CARE_PROVIDER_SITE_OTHER): Payer: Self-pay | Admitting: Internal Medicine

## 2016-08-17 VITALS — BP 152/105 | HR 77 | Temp 98.2°F | Wt 268.9 lb

## 2016-08-17 DIAGNOSIS — N184 Chronic kidney disease, stage 4 (severe): Secondary | ICD-10-CM | POA: Insufficient documentation

## 2016-08-17 DIAGNOSIS — E669 Obesity, unspecified: Secondary | ICD-10-CM

## 2016-08-17 DIAGNOSIS — E1122 Type 2 diabetes mellitus with diabetic chronic kidney disease: Secondary | ICD-10-CM

## 2016-08-17 DIAGNOSIS — Z87891 Personal history of nicotine dependence: Secondary | ICD-10-CM

## 2016-08-17 DIAGNOSIS — E118 Type 2 diabetes mellitus with unspecified complications: Secondary | ICD-10-CM

## 2016-08-17 DIAGNOSIS — E1159 Type 2 diabetes mellitus with other circulatory complications: Secondary | ICD-10-CM

## 2016-08-17 DIAGNOSIS — N182 Chronic kidney disease, stage 2 (mild): Secondary | ICD-10-CM

## 2016-08-17 DIAGNOSIS — I10 Essential (primary) hypertension: Principal | ICD-10-CM

## 2016-08-17 DIAGNOSIS — I129 Hypertensive chronic kidney disease with stage 1 through stage 4 chronic kidney disease, or unspecified chronic kidney disease: Secondary | ICD-10-CM

## 2016-08-17 MED ORDER — METFORMIN HCL 1000 MG PO TABS: 1000.0000 mg | ORAL_TABLET | Freq: Two times a day (BID) | ORAL | 1 refills | Status: DC

## 2016-08-17 MED ORDER — LISINOPRIL 40 MG PO TABS: 40.0000 mg | ORAL_TABLET | Freq: Every day | ORAL | 1 refills | Status: DC

## 2016-08-17 NOTE — Assessment & Plan Note (Addendum)
History of present illness Patient was recently admitted from 08/06/2016 to 08/08/2016 for hypertensive urgency which was thought to be secondary to inadequate medical management and/or medication nonadherence. Patient reports having hypertension for the past 2-3 years but is not sure when he was initially started on medications. States he has been on medications intermittently in the past. Patient was previously on amlodipine and hydrochlorothiazide. Lisinopril was started during his recent hospitalization. His current medication regimen includes amlodipine 10 mg daily, hydrochlorothiazide 25 mg daily, and lisinopril 20 mg daily. Patient reports compliance with these medications. Denies having any chest pain, shortness of breath, or headaches. Does report having a strong family history (both paternal and maternal sides) of hypertension. Initial blood pressure 157/107; repeat 152/105 at this visit. Patient denies taking any NSAIDs, steroids, antidepressants, decongestants, or weight loss supplements. Denies any illicit drug use. Does not endorse symptoms consistent with OSA. Denies having any proximal myopathy or easy bruising.  Assessment Patient's hypertension is likely associated with obesity and a strong family history. However, primary hyperaldosteronism is high on the differential as patient has been noted to be intermittently hypokalemic on prior labs. Although he is currently on hydrochlorothiazide, it is unclear from the history how [place] he has been taking this medication and whether he has been compliant in the past. Although patient does not have symptoms consistent with hyperadrenergic spells, pheochromocytoma still remains on the differential in the setting of uncontrolled hypertension and new onset diabetes. Other differentials include thyroid disease and coarctation of the aorta. Patient is not currently on any medications implicated in causing hypertension. Renal vascular hypertension less likely  as renal artery duplex ultrasound done during recent hospitalization was negative for renal artery stenosis bilaterally. However, primary renal disease still remains in the differential as patient's creatinine has been elevated since 2015. Most recent creatinine 1.6 and GFR greater than 60. Cushing syndrome less likely as patient does not have facial plethora, proximal myopathy, purple striae, or easy bruising.  Plan Patient is currently uninsured. He is waiting for insurance from his new job to start. He is unable to pay for most diagnostic tests at this time.  -Continue amlodipine 10 mg daily -Continue hydrochlorothiazide 25 mg daily -Increased dose of lisinopril to 40 mg daily -Will only order a BMP at this time to check his renal function and patient was started on an ACE inhibitor during his hospitalization and I am increasing the dose of the medication today for his uncontrolled hypertension. -Advised him to buy a blood pressure machine whenever he is able to afford one to keep a record of his home blood pressure readings.  Patient has been advised to return to the clinic in 4 weeks for blood pressure recheck or as soon as he is able to pay for further diagnostic tests. Please check the following labs at his next visit: -Morning renin to aldosterone ratio to check for primary hyperaldosteronism. If abnormal, would recommend ordering an adrenal CT for further workup. -TSH to rule out thyroid disease -Urinalysis with microscopic examination and urine microalbumin to creatinine ratio for workup of primary renal disease. He will also need a renal ultrasound in the near future.  If workup for primary hyperaldosteronism is negative, would recommend checking the following labs: -24 hour urinary fractionated catecholamines as a screening test for pheochromocytoma -Would recommend comparing blood pressure from upper and lower extremities if there remains any suspicion for coarctation of  aorta.  Addendum 08/18/2016 at 9:08 AM: Creatinine stable at 1.6. Tried calling the patient  but could not reach him over the phone.

## 2016-08-17 NOTE — Assessment & Plan Note (Addendum)
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 His creatinine was 1.6 in October 2015. Creatinine during recent hospitalization 1.6 and GFR greater than 60.  Plan Patient is currently uninsured and is not able to pay for lab work. At his next visit/ when patient is able to afford, please order the following labs:  -Urinalysis with microscopic examination -Urine microalbumin to creatinine ratio -He will also need a renal ultrasound in the near future.

## 2016-08-17 NOTE — Patient Instructions (Signed)
Mr. Gervin it was nice meeting you today.  -Dose of lisinopril has been increased: Take 40 mg by mouth once daily.  -Continue taking amlodipine and hydrochlorothiazide as before  -Dose of metformin has been increased: Take 1000 mg by mouth twice daily.  -Return for a follow-up visit in 4 weeks.

## 2016-08-17 NOTE — Assessment & Plan Note (Signed)
History of present illness During his recent hospitalization, patient was found to have diabetes. His A1c was 9.4. Fasting CBGs during hospitalization >126 and random blood glucose values >200. He was started on metformin 500 mg twice daily with plan to titrate up the dose. At present, patient continues to take metformin 500 mg twice daily. Reports having "a gassy feeling" with this medication and 2 episodes of abdominal pain that lasted 30 seconds each since his hospital discharge on June 9.. Denies having any diarrhea, nausea, vomiting, or bloating. Reports having a strong family history of diabetes on the maternal side.  Assessment New onset type 2 diabetes. Risk factors include obesity and a strong family history.  Plan -Increased dose of metformin to 1000 mg twice daily as patient seems to be tolerating this medication fairly well

## 2016-08-17 NOTE — Progress Notes (Signed)
   CC: Patient is here for a hospital follow-up of hypertension and diabetes. Chronic kidney disease was also discussed during this visit.  HPI:  [name] is a 36 y.o. male with a past medical history of conditions listed below presenting to the clinic for a hospital follow-up of hypertension and diabetes. Chronic kidney disease was also discussed this visit. Please see problem based charting for the status of the patient's current and chronic medical conditions.   Past Medical History:  Diagnosis Date  . Hypertension   . Pre-diabetes   . Wasp sting 08/04/2016   of dorsum of left hand near thumb    Review of Systems: Pertinent positives mentioned in HPI. Remainder of all ROS negative.   Physical Exam:  Vitals:   08/17/16 0953 08/17/16 1103  BP: (!) 157/107 (!) 152/105  Pulse: 75 77  Temp: 98.2 F (36.8 C)   TempSrc: Oral   SpO2: 99%   Weight: 268 lb 14.4 oz (122 kg)    Physical Exam  Constitutional: He is oriented to person, place, and time. He appears well-developed and well-nourished. No distress.  Obese  HENT:  Head: Normocephalic and atraumatic.  Eyes: Right eye exhibits no discharge. Left eye exhibits no discharge.  Cardiovascular: Normal rate, regular rhythm and intact distal pulses.   Pulmonary/Chest: Effort normal and breath sounds normal. No respiratory distress. He has no wheezes. He has no rales.  Abdominal: Soft. Bowel sounds are normal. He exhibits no distension. There is no tenderness.  Musculoskeletal: He exhibits no edema.  Neurological: He is alert and oriented to person, place, and time.  Skin: Skin is warm and dry.  Stretch marks noted on the abdomen. No purple striae noted.    Assessment & Plan:   See Encounters Tab for problem based charting.  Patient discussed with Dr. Daryll Drown

## 2016-08-18 ENCOUNTER — Telehealth: Payer: Self-pay | Admitting: Internal Medicine

## 2016-08-18 LAB — BMP8+ANION GAP
ANION GAP: 19 mmol/L — AB (ref 10.0–18.0)
BUN/Creatinine Ratio: 14 (ref 9–20)
BUN: 23 mg/dL — AB (ref 6–20)
CO2: 23 mmol/L (ref 20–29)
CREATININE: 1.67 mg/dL — AB (ref 0.76–1.27)
Calcium: 9.7 mg/dL (ref 8.7–10.2)
Chloride: 98 mmol/L (ref 96–106)
GFR calc Af Amer: 61 mL/min/{1.73_m2} (ref >59)
GFR calc non Af Amer: 53 mL/min/{1.73_m2} — ABNORMAL LOW (ref >59)
Glucose: 146 mg/dL — ABNORMAL HIGH (ref 65–99)
Potassium: 3.9 mmol/L (ref 3.5–5.2)
Sodium: 140 mmol/L (ref 134–144)

## 2016-08-18 NOTE — Telephone Encounter (Signed)
I tried calling the patient again but could not reach him.

## 2016-08-18 NOTE — Telephone Encounter (Signed)
Returning phone call about labs drawn on 08/17/2016.

## 2016-08-20 NOTE — Progress Notes (Signed)
Internal Medicine Clinic Attending  Case discussed with Dr. Rathoreat the time of the visit. We reviewed the resident's history and exam and pertinent patient test results. I agree with the assessment, diagnosis, and plan of care documented in the resident's note.  

## 2016-09-15 ENCOUNTER — Encounter: Payer: Self-pay | Admitting: Internal Medicine

## 2017-05-19 ENCOUNTER — Ambulatory Visit: Payer: Self-pay

## 2017-05-21 ENCOUNTER — Encounter: Payer: Self-pay | Admitting: Internal Medicine

## 2017-05-21 ENCOUNTER — Ambulatory Visit (INDEPENDENT_AMBULATORY_CARE_PROVIDER_SITE_OTHER): Payer: Self-pay | Admitting: Internal Medicine

## 2017-05-21 ENCOUNTER — Other Ambulatory Visit: Payer: Self-pay

## 2017-05-21 VITALS — BP 188/129 | HR 92 | Temp 98.4°F | Ht 69.0 in | Wt 274.1 lb

## 2017-05-21 DIAGNOSIS — I152 Hypertension secondary to endocrine disorders: Secondary | ICD-10-CM

## 2017-05-21 DIAGNOSIS — N182 Chronic kidney disease, stage 2 (mild): Secondary | ICD-10-CM

## 2017-05-21 DIAGNOSIS — E118 Type 2 diabetes mellitus with unspecified complications: Secondary | ICD-10-CM

## 2017-05-21 DIAGNOSIS — E1159 Type 2 diabetes mellitus with other circulatory complications: Secondary | ICD-10-CM

## 2017-05-21 DIAGNOSIS — I129 Hypertensive chronic kidney disease with stage 1 through stage 4 chronic kidney disease, or unspecified chronic kidney disease: Secondary | ICD-10-CM

## 2017-05-21 DIAGNOSIS — I10 Essential (primary) hypertension: Secondary | ICD-10-CM

## 2017-05-21 DIAGNOSIS — E1122 Type 2 diabetes mellitus with diabetic chronic kidney disease: Secondary | ICD-10-CM

## 2017-05-21 DIAGNOSIS — I160 Hypertensive urgency: Secondary | ICD-10-CM

## 2017-05-21 DIAGNOSIS — Z9114 Patient's other noncompliance with medication regimen: Secondary | ICD-10-CM

## 2017-05-21 LAB — POCT GLYCOSYLATED HEMOGLOBIN (HGB A1C): HEMOGLOBIN A1C: 8.9

## 2017-05-21 LAB — GLUCOSE, CAPILLARY: Glucose-Capillary: 175 mg/dL — ABNORMAL HIGH (ref 65–99)

## 2017-05-21 MED ORDER — HYDROCHLOROTHIAZIDE 25 MG PO TABS: 25.0000 mg | ORAL_TABLET | Freq: Every day | ORAL | 1 refills | Status: DC

## 2017-05-21 MED ORDER — HYDRALAZINE HCL 10 MG PO TABS
50.0000 mg | ORAL_TABLET | ORAL | Status: AC
  Administered 2017-05-21: 50 mg via ORAL

## 2017-05-21 MED ORDER — AMLODIPINE BESYLATE 10 MG PO TABS: 10.0000 mg | ORAL_TABLET | Freq: Every day | ORAL | 1 refills | Status: DC

## 2017-05-21 MED ORDER — FUROSEMIDE 40 MG PO TABS
40.0000 mg | ORAL_TABLET | Freq: Once | ORAL | Status: AC
  Administered 2017-05-21: 40 mg via ORAL

## 2017-05-21 MED ORDER — METFORMIN HCL 1000 MG PO TABS: 1000.0000 mg | ORAL_TABLET | Freq: Two times a day (BID) | ORAL | 2 refills | Status: DC

## 2017-05-21 MED ORDER — LISINOPRIL 40 MG PO TABS: 40.0000 mg | ORAL_TABLET | Freq: Every day | ORAL | 1 refills | Status: DC

## 2017-05-21 NOTE — Assessment & Plan Note (Addendum)
BP 220/162 today. Patient is out of his medication for the past 3 months and is in hypertensive urgency at this time. He endorses some mild headaches 1-2/week. He denies blurry vision, but does require more light for reading in the last few months. Patient has not had his medications for the past 3 months. He was previously taking amlodipine 10 mg daily, hydrochlorothiazide 25 mg daily, and lisinopril to 40 mg daily. He does not have insurance, these medication will be refilled for him using out $4 list, which he states he can afford. We will administer hydralazine and Lasix in the clinic today and patient will be permitted to go home if blood pressure improves; if not he will need admission. Now: - Hydralazine 50mg  PO now - Lasix 40mg  PO now  Patients blood pressure improved to 188/129 with above in clinic medications. Patient remains asymptomatic. He is being sent home with 1 week supply of blood pressure medications in hand. He was given stict return precautions and instructed to go to the ED over the weekend for any severe headache or neurologic symptoms. - Amlodipine 10 mg daily - Hydrochlorothiazide 25 mg daily - Lisinopril to 40 mg daily - Follow up at the beginning of next week for BP recheck and medication adjustments - Patient will need to be seen again in 2 weeks for BMP with restarting lisinopril and history of CKD II  ADDENDUM: Patient had Aldosterone/Renin ordered to evaluate for secondary cause of HTN. This lab came back within normal limits.

## 2017-05-21 NOTE — Patient Instructions (Addendum)
Thank you for allowing [place] to care for you  For your high blood pressure: - BP today was 220/162 initially, with medication this improved to 188/129 - We are sending you home with 1 week of Amlodipine, Hydrochlorothiazide, and Lisinopril, to be taken daily - We have also ordered these medications to the  outpatient pharmacy - If you experience severe headaches, numbness, or weakness; please come to the ED immediately  For your diabetes: - A1c today 8.9 - We have ordered metformin to be take twice daily to the pharmacy  Return to clinic at the beginning of next week for blood pressure recheck and further medication adjustments

## 2017-05-21 NOTE — Progress Notes (Signed)
Provided 1-week supply of medication samples to patient. Educated patient on each medication and advised him to contact clinic if any concerns arise.  Patient verbalized understanding by repeating back information and was advised to contact me if further medication-related questions arise. Medications provided today: Lisinopril 40 mg: LOT [id number], EXP 6/20 HCTZ 25 mg: LOT [id number], EXP 8/20 AMLODIPINE 10 mg: LOT [id number], EXP 7/20

## 2017-05-21 NOTE — Progress Notes (Signed)
   CC: Hypertension, Diabetes  HPI:  [name] is a 37 y.o. M with PMHx listed below presenting for Hypertension, Diabetes. Please see the A&P for the status of the patient's chronic medical problems.   Past Medical History:  Diagnosis Date  . Hypertension   . Pre-diabetes   . Wasp sting 08/04/2016   of dorsum of left hand near thumb   Review of Systems:  Performed and all others negative.  Physical Exam:  Vitals:   05/21/17 1050 05/21/17 1227 05/21/17 1300 05/21/17 1301  BP: (!) 220/162 (!) 203/121 (!) 209/115 (!) 188/129  Pulse: 98 (!) 101 95 92  Temp: 98.4 F (36.9 C)     TempSrc: Oral     SpO2: 97%     Weight: 274 lb 1.6 oz (124.3 kg)     Height: 5\' 9"  (1.753 m)      Physical Exam  Constitutional: He is oriented to person, place, and time. He appears well-developed and well-nourished. No distress.  HENT:  Head: Normocephalic and atraumatic.  No papilledema   Cardiovascular: Normal rate, regular rhythm, normal heart sounds and intact distal pulses.  Pulmonary/Chest: Effort normal and breath sounds normal. No respiratory distress.  Abdominal: Soft. Bowel sounds are normal. He exhibits no distension. There is no tenderness.  Musculoskeletal: He exhibits no edema or deformity.  Neurological: He is oriented to person, place, and time.  Skin: Skin is warm and dry.   Assessment & Plan:   See Encounters Tab for problem based charting.  Patient seen with Dr. Beryle Beams

## 2017-05-21 NOTE — Progress Notes (Signed)
Medicine attending: I personally interviewed and briefly examined this patient on the day of the patient visit and reviewed pertinent clinical and laboratory, data  with resident physician Dr. Neva Seat and we discussed a management plan. Uncontrolled hypertension. Previous hospital admission for the same 8/18. Not taking meds, Now BP 220/162. No CNS signs/sxs. Optic discs sharp. No retinal hemorrhages. Known stage 2-3 CKD. We gave him furosemide 40 mg & Hydralazine 50 mg in clinic, observed x 2 hours.  BP down to 188/129. Patient informed he is at high risk for stroke or MI & he must take his meds. We gave him a 1 week supply in hand at time of discharge from the clinic. Will get orange card assistance. Follow up next week. Advised to return immediately for any neuro sxs to ED over weekend.  Given degree of CKD, he would benefit from higher dose of diuretic.Suggest furosemide 40 mg daily.Would also try to put him on a more user friendly regimen if he can afford it such as a clonidine transderm patch or a [place] acting, once a day, beta-blocker.

## 2017-05-21 NOTE — Assessment & Plan Note (Addendum)
A1c Today 8.9. Last A1c 9.4 June/2018. Patient has been out of his Metformin for about 3 months. Will reorder this medication on our $4 list and have patient recheck in 3 months. - Metformin 1000mg  BID - Repeat A1c in 3 months

## 2017-05-26 LAB — ALDOSTERONE + RENIN ACTIVITY W/ RATIO
ALDOS/RENIN RATIO: 8.8 (ref 0.0–30.0)
ALDOSTERONE: 3.2 ng/dL (ref 0.0–30.0)
RENIN: 0.364 ng/mL/h (ref 0.167–5.380)

## 2017-05-28 ENCOUNTER — Other Ambulatory Visit: Payer: Self-pay

## 2017-05-28 ENCOUNTER — Ambulatory Visit (INDEPENDENT_AMBULATORY_CARE_PROVIDER_SITE_OTHER): Payer: Self-pay | Admitting: Internal Medicine

## 2017-05-28 DIAGNOSIS — I152 Hypertension secondary to endocrine disorders: Secondary | ICD-10-CM

## 2017-05-28 DIAGNOSIS — E1159 Type 2 diabetes mellitus with other circulatory complications: Secondary | ICD-10-CM

## 2017-05-28 DIAGNOSIS — Z79899 Other long term (current) drug therapy: Secondary | ICD-10-CM

## 2017-05-28 DIAGNOSIS — R0981 Nasal congestion: Secondary | ICD-10-CM

## 2017-05-28 DIAGNOSIS — I10 Essential (primary) hypertension: Secondary | ICD-10-CM

## 2017-05-28 DIAGNOSIS — Z7984 Long term (current) use of oral hypoglycemic drugs: Secondary | ICD-10-CM

## 2017-05-28 DIAGNOSIS — E118 Type 2 diabetes mellitus with unspecified complications: Secondary | ICD-10-CM

## 2017-05-28 MED ORDER — AMLODIPINE BESYLATE 10 MG PO TABS: 10.0000 mg | ORAL_TABLET | Freq: Every day | ORAL | 4 refills | Status: DC

## 2017-05-28 MED ORDER — HYDROCHLOROTHIAZIDE 25 MG PO TABS: 25.0000 mg | ORAL_TABLET | Freq: Every day | ORAL | 4 refills | Status: DC

## 2017-05-28 MED ORDER — LISINOPRIL 40 MG PO TABS: 40.0000 mg | ORAL_TABLET | Freq: Every day | ORAL | 4 refills | Status: DC

## 2017-05-28 MED ORDER — SPIRONOLACTONE 25 MG PO TABS: 25.0000 mg | ORAL_TABLET | Freq: Every day | ORAL | 11 refills | Status: DC

## 2017-05-28 NOTE — Patient Instructions (Addendum)
Thank you for visiting clinic today. Your blood pressure remained high, although much improved from your previous one. I am adding another medicine called spironolactone 25 mg daily to your existing regimen. Please take your medicine as directed and follow-up in 1 week for your blood workup.  FOLLOW-UP INSTRUCTIONS When: 1 week For: Blood pressure check and BMP What to bring:

## 2017-05-28 NOTE — Assessment & Plan Note (Signed)
BP Readings from Last 3 Encounters:  05/28/17 (!) 194/121  05/21/17 (!) 188/129  08/17/16 (!) 152/105   On recheck his blood pressure decreased to 162/115-still elevated. He did took all his medications for the past week including this morning. He denies any other signs and symptoms of hypertensive urgency.  Patient agreed to pick up his medication from [place] and should be able to afford dollars 20 each month for his medication. Added spironolactone 25 mg daily to his existing regimen of lisinopril 40 mg daily, amlodipine 10 mg daily and HCTZ 25 mg daily. -Follow-up in 1 week for blood pressure check and to get BMP.

## 2017-05-28 NOTE — Progress Notes (Signed)
Internal Medicine Clinic Attending  Case discussed with Dr. Amin at the time of the visit.  We reviewed the resident's history and exam and pertinent patient test results.  I agree with the assessment, diagnosis, and plan of care documented in the resident's note.    

## 2017-05-28 NOTE — Assessment & Plan Note (Signed)
Patient started taking his Metformin. Denies any GI upset.  Continue taking Metformin twice daily. We will repeat A1c in 60-month.

## 2017-05-28 NOTE — Progress Notes (Signed)
   CC: For follow-up of his hypertension.  HPI:  [name] is a 37 y.o. with past medical history as listed below came to the clinic for follow-up of his hypertension.  He was having minor nasal congestion for the past 2-3 days, using over-the-counter DayQuil which helped with his symptoms. Denies any fever or chills, no sore throat or body ache.  Some of the family member with similar symptoms. No headaches or blurry vision. No nausea or vomiting, no chest pain or shortness of breath.  Please see assessment and plan for his chronic conditions.  Past Medical History:  Diagnosis Date  . Hypertension   . Pre-diabetes   . Wasp sting 08/04/2016   of dorsum of left hand near thumb   Review of Systems: Negative except mentioned in HPI.  Physical Exam:  Vitals:   05/28/17 1052  BP: (!) 194/121  Pulse: 88  Temp: 98.5 F (36.9 C)  TempSrc: Oral  SpO2: 100%  Weight: 272 lb (123.4 kg)  Height: 5\' 9"  (1.753 m)    General: Vital signs reviewed.  Patient is well-developed and well-nourished, in no acute distress and cooperative with exam.  Head: Normocephalic and atraumatic. Eyes: EOMI, conjunctivae normal, no scleral icterus.  Cardiovascular: RRR, S1 normal, S2 normal, no murmurs, gallops, or rubs. Pulmonary/Chest: Clear to auscultation bilaterally, no wheezes, rales, or rhonchi. Abdominal: Soft, non-tender, non-distended, BS +, no masses, organomegaly, or guarding present.  Extremities: No lower extremity edema bilaterally,  pulses symmetric and intact bilaterally. No cyanosis or clubbing. Skin: Warm, dry and intact. No rashes or erythema. Psychiatric: Normal mood and affect. speech and behavior is normal. Cognition and memory are normal.  Assessment & Plan:   See Encounters Tab for problem based charting.  Patient discussed with Dr. Lynnae January.

## 2017-05-31 MED FILL — SPIRONOLACTONE 25 MG TABLET: 25 | 30 days supply | Qty: 30 | Fill #0

## 2017-05-31 MED FILL — LISINOPRIL 40 MG TABLET: 40 | 30 days supply | Qty: 30 | Fill #0

## 2017-05-31 MED FILL — AMLODIPINE BESYLATE 10 MG T: 10 | 30 days supply | Qty: 30 | Fill #0

## 2017-05-31 MED FILL — HYDROCHLOROTHIAZIDE 25 MG T: 25 | 30 days supply | Qty: 30 | Fill #0

## 2017-05-31 MED FILL — metFORMIN HCL 1000 MG TABS: 1000 | 30 days supply | Qty: 60 | Fill #0

## 2017-06-04 ENCOUNTER — Ambulatory Visit: Payer: Self-pay

## 2017-06-04 ENCOUNTER — Encounter: Payer: Self-pay | Admitting: Internal Medicine

## 2017-06-04 NOTE — Progress Notes (Signed)
Patient sent a letter informing of his recent normal lab results.  Pearson Grippe, DO IM PGY-1

## 2017-06-11 ENCOUNTER — Ambulatory Visit: Payer: Self-pay

## 2017-07-31 DIAGNOSIS — H9209 Otalgia, unspecified ear: Secondary | ICD-10-CM

## 2017-07-31 HISTORY — DX: Otalgia, unspecified ear: H92.09

## 2017-08-11 ENCOUNTER — Emergency Department (HOSPITAL_COMMUNITY): Payer: Medicaid Other

## 2017-08-11 ENCOUNTER — Encounter (HOSPITAL_COMMUNITY): Payer: Self-pay | Admitting: Emergency Medicine

## 2017-08-11 ENCOUNTER — Inpatient Hospital Stay (HOSPITAL_COMMUNITY)
Admission: EM | Admit: 2017-08-11 | Discharge: 2017-08-13 | DRG: 156 | Disposition: A | Discharge status: Home / Self Care | Payer: Medicaid Other | Attending: Internal Medicine | Admitting: Internal Medicine

## 2017-08-11 DIAGNOSIS — Z881 Allergy status to other antibiotic agents status: Secondary | ICD-10-CM

## 2017-08-11 DIAGNOSIS — E119 Type 2 diabetes mellitus without complications: Secondary | ICD-10-CM

## 2017-08-11 DIAGNOSIS — N183 Chronic kidney disease, stage 3 (moderate): Secondary | ICD-10-CM | POA: Diagnosis present

## 2017-08-11 DIAGNOSIS — Z7984 Long term (current) use of oral hypoglycemic drugs: Secondary | ICD-10-CM

## 2017-08-11 DIAGNOSIS — R809 Proteinuria, unspecified: Secondary | ICD-10-CM | POA: Diagnosis present

## 2017-08-11 DIAGNOSIS — R0789 Other chest pain: Secondary | ICD-10-CM | POA: Diagnosis present

## 2017-08-11 DIAGNOSIS — R519 Headache, unspecified: Secondary | ICD-10-CM

## 2017-08-11 DIAGNOSIS — Z9114 Patient's other noncompliance with medication regimen: Secondary | ICD-10-CM

## 2017-08-11 DIAGNOSIS — R000 Tachycardia, unspecified: Secondary | ICD-10-CM | POA: Diagnosis present

## 2017-08-11 DIAGNOSIS — E1122 Type 2 diabetes mellitus with diabetic chronic kidney disease: Secondary | ICD-10-CM | POA: Diagnosis present

## 2017-08-11 DIAGNOSIS — I152 Hypertension secondary to endocrine disorders: Secondary | ICD-10-CM

## 2017-08-11 DIAGNOSIS — E1165 Type 2 diabetes mellitus with hyperglycemia: Secondary | ICD-10-CM

## 2017-08-11 DIAGNOSIS — I10 Essential (primary) hypertension: Secondary | ICD-10-CM | POA: Diagnosis present

## 2017-08-11 DIAGNOSIS — E118 Type 2 diabetes mellitus with unspecified complications: Secondary | ICD-10-CM

## 2017-08-11 DIAGNOSIS — I129 Hypertensive chronic kidney disease with stage 1 through stage 4 chronic kidney disease, or unspecified chronic kidney disease: Secondary | ICD-10-CM | POA: Diagnosis present

## 2017-08-11 DIAGNOSIS — R9431 Abnormal electrocardiogram [ECG] [EKG]: Secondary | ICD-10-CM | POA: Diagnosis present

## 2017-08-11 DIAGNOSIS — N184 Chronic kidney disease, stage 4 (severe): Secondary | ICD-10-CM | POA: Diagnosis present

## 2017-08-11 DIAGNOSIS — H7093 Unspecified mastoiditis, bilateral: Secondary | ICD-10-CM | POA: Diagnosis present

## 2017-08-11 DIAGNOSIS — Z79899 Other long term (current) drug therapy: Secondary | ICD-10-CM

## 2017-08-11 DIAGNOSIS — I160 Hypertensive urgency: Secondary | ICD-10-CM | POA: Diagnosis present

## 2017-08-11 DIAGNOSIS — E1159 Type 2 diabetes mellitus with other circulatory complications: Secondary | ICD-10-CM | POA: Diagnosis present

## 2017-08-11 DIAGNOSIS — R51 Headache: Secondary | ICD-10-CM | POA: Diagnosis present

## 2017-08-11 DIAGNOSIS — H9203 Otalgia, bilateral: Secondary | ICD-10-CM

## 2017-08-11 DIAGNOSIS — R0602 Shortness of breath: Secondary | ICD-10-CM | POA: Diagnosis present

## 2017-08-11 DIAGNOSIS — N182 Chronic kidney disease, stage 2 (mild): Secondary | ICD-10-CM | POA: Diagnosis present

## 2017-08-11 DIAGNOSIS — H6091 Unspecified otitis externa, right ear: Principal | ICD-10-CM | POA: Diagnosis present

## 2017-08-11 DIAGNOSIS — H9193 Unspecified hearing loss, bilateral: Secondary | ICD-10-CM | POA: Diagnosis present

## 2017-08-11 DIAGNOSIS — H7090 Unspecified mastoiditis, unspecified ear: Secondary | ICD-10-CM | POA: Diagnosis present

## 2017-08-11 DIAGNOSIS — Z87891 Personal history of nicotine dependence: Secondary | ICD-10-CM

## 2017-08-11 HISTORY — DX: Chronic kidney disease, stage 2 (mild): N18.2

## 2017-08-11 HISTORY — DX: Type 2 diabetes mellitus without complications: E11.9

## 2017-08-11 HISTORY — DX: Otalgia, unspecified ear: H92.09

## 2017-08-11 LAB — URINALYSIS, ROUTINE W REFLEX MICROSCOPIC
Bacteria, UA: NONE SEEN
Bilirubin Urine: NEGATIVE
GLUCOSE, UA: 150 mg/dL — AB
KETONES UR: NEGATIVE mg/dL
Leukocytes, UA: NEGATIVE
NITRITE: NEGATIVE
PH: 5 (ref 5.0–8.0)
Protein, ur: >=300 mg/dL — AB
Specific Gravity, Urine: 1.018 (ref 1.005–1.030)

## 2017-08-11 LAB — CBC WITH DIFFERENTIAL/PLATELET
ABS IMMATURE GRANULOCYTES: 0 10*3/uL (ref 0.0–0.1)
BASOS ABS: 0 10*3/uL (ref 0.0–0.1)
Basophils Relative: 0 %
EOS PCT: 0 %
Eosinophils Absolute: 0 10*3/uL (ref 0.0–0.7)
HEMATOCRIT: 43.6 % (ref 39.0–52.0)
HEMOGLOBIN: 14.3 g/dL (ref 13.0–17.0)
IMMATURE GRANULOCYTES: 0 %
LYMPHS ABS: 1.5 10*3/uL (ref 0.7–4.0)
LYMPHS PCT: 17 %
MCH: 26.2 pg (ref 26.0–34.0)
MCHC: 32.8 g/dL (ref 30.0–36.0)
MCV: 80 fL (ref 78.0–100.0)
Monocytes Absolute: 0.5 10*3/uL (ref 0.1–1.0)
Monocytes Relative: 5 %
NEUTROS ABS: 6.9 10*3/uL (ref 1.7–7.7)
Neutrophils Relative %: 78 %
Platelets: 249 10*3/uL (ref 150–400)
RBC: 5.45 MIL/uL (ref 4.22–5.81)
RDW: 13.2 % (ref 11.5–15.5)
WBC: 8.9 10*3/uL (ref 4.0–10.5)

## 2017-08-11 LAB — RAPID URINE DRUG SCREEN, HOSP PERFORMED
Amphetamines: NOT DETECTED
BARBITURATES: NOT DETECTED
BENZODIAZEPINES: NOT DETECTED
COCAINE: NOT DETECTED
OPIATES: NOT DETECTED
Tetrahydrocannabinol: NOT DETECTED

## 2017-08-11 LAB — BASIC METABOLIC PANEL
Anion gap: 13 (ref 5–15)
BUN: 20 mg/dL (ref 6–20)
CHLORIDE: 103 mmol/L (ref 101–111)
CO2: 22 mmol/L (ref 22–32)
Calcium: 9.1 mg/dL (ref 8.9–10.3)
Creatinine, Ser: 1.89 mg/dL — ABNORMAL HIGH (ref 0.61–1.24)
GFR calc Af Amer: 52 mL/min — ABNORMAL LOW (ref >60)
GFR, EST NON AFRICAN AMERICAN: 45 mL/min — AB (ref >60)
Glucose, Bld: 208 mg/dL — ABNORMAL HIGH (ref 65–99)
POTASSIUM: 3.2 mmol/L — AB (ref 3.5–5.1)
SODIUM: 138 mmol/L (ref 135–145)

## 2017-08-11 LAB — I-STAT CG4 LACTIC ACID, ED
Lactic Acid, Venous: 1.21 mmol/L (ref 0.5–1.9)
Lactic Acid, Venous: 1.74 mmol/L (ref 0.5–1.9)

## 2017-08-11 LAB — I-STAT TROPONIN, ED
TROPONIN I, POC: 0.02 ng/mL (ref 0.00–0.08)
Troponin i, poc: 0.02 ng/mL (ref 0.00–0.08)

## 2017-08-11 MED ORDER — CEFEPIME HCL 2 G IJ SOLR
2.0000 g | Freq: Once | INTRAMUSCULAR | Status: AC
  Administered 2017-08-11: 2 g via INTRAVENOUS
  Filled 2017-08-11: qty 2

## 2017-08-11 MED ORDER — IOHEXOL 300 MG/ML  SOLN
100.0000 mL | Freq: Once | INTRAMUSCULAR | Status: AC | PRN
  Administered 2017-08-11: 75 mL via INTRAVENOUS

## 2017-08-11 MED ORDER — POTASSIUM CHLORIDE CRYS ER 20 MEQ PO TBCR
40.0000 meq | EXTENDED_RELEASE_TABLET | Freq: Once | ORAL | Status: AC
  Administered 2017-08-11: 40 meq via ORAL
  Filled 2017-08-11: qty 2

## 2017-08-11 MED ORDER — LABETALOL HCL 5 MG/ML IV SOLN
10.0000 mg | Freq: Once | INTRAVENOUS | Status: AC
  Administered 2017-08-11: 10 mg via INTRAVENOUS
  Filled 2017-08-11: qty 4

## 2017-08-11 MED ORDER — SODIUM CHLORIDE 0.9 % IV BOLUS
1000.0000 mL | Freq: Once | INTRAVENOUS | Status: AC
  Administered 2017-08-11: 1000 mL via INTRAVENOUS

## 2017-08-11 MED ORDER — ACETAMINOPHEN 500 MG PO TABS: 500.0000 mg | ORAL_TABLET | Freq: Once | ORAL | Status: DC

## 2017-08-11 MED ORDER — HYDROCODONE-ACETAMINOPHEN 5-325 MG PO TABS
2.0000 | ORAL_TABLET | Freq: Once | ORAL | Status: AC
  Administered 2017-08-11: 2 via ORAL
  Filled 2017-08-11: qty 2

## 2017-08-11 MED ORDER — VANCOMYCIN HCL 10 G IV SOLR
2500.0000 mg | Freq: Once | INTRAVENOUS | Status: AC
  Administered 2017-08-11: 2500 mg via INTRAVENOUS
  Filled 2017-08-11 (×2): qty 2500

## 2017-08-11 MED ORDER — SODIUM CHLORIDE 0.9 % IV SOLN
2.0000 g | Freq: Two times a day (BID) | INTRAVENOUS | Status: DC
  Administered 2017-08-12: 2 g via INTRAVENOUS
  Filled 2017-08-11 (×2): qty 2

## 2017-08-11 MED ORDER — PIPERACILLIN-TAZOBACTAM 3.375 G IVPB 30 MIN: 3.3750 g | Freq: Once | INTRAVENOUS | Status: DC

## 2017-08-11 MED ORDER — VANCOMYCIN HCL IN DEXTROSE 1-5 GM/200ML-% IV SOLN
1000.0000 mg | Freq: Two times a day (BID) | INTRAVENOUS | Status: DC
  Administered 2017-08-12: 1000 mg via INTRAVENOUS
  Filled 2017-08-11: qty 200

## 2017-08-11 NOTE — ED Provider Notes (Signed)
[facility] EMERGENCY DEPARTMENT Provider Note   CSN: [id number] Arrival date & time: 08/11/17  [facility]     History   Chief Complaint Chief Complaint  Patient presents with  . Otalgia  . Headache    HPI Harold Clements is a 37 y.o. male.  HPI  Patient is a 37 year old male with a history of hypertension and diabetes presenting for global headache, right otalgia, and fever.  Patient reports that one week ago he began having left ear drainage and pain, that was treated with home with irrigation and cleaning.  Over the last 2 days, patient has had rapidly progressive pain, swelling, and tenderness of the right upper and right side of the face.  Patient has a history of recurrent otitis externa, but has never been hospitalized for otitis externa.  Patient reports that the fever began today.  At present, patient also notes blurred vision, intermittent left-sided chest pain, but no shortness of breath.  Patient denies any focal weakness or numbness, difficulty speaking, or memory difficulty.  Patient reports that his blood pressure is typically very high when he presents to his primary care provider, and intermittently takes his antihypertensives.  Patient has been extensively worked up for this.    Past Medical History:  Diagnosis Date  . Hypertension   . Pre-diabetes   . Wasp sting 08/04/2016   of dorsum of left hand near thumb    Patient Active Problem List   Diagnosis Date Noted  . CKD (chronic kidney disease) stage 2, GFR 60-89 ml/min 08/17/2016  . Hypertension associated with diabetes ([place]) 08/06/2016  . Diabetes ([place]) 08/06/2016    History reviewed. No pertinent surgical history.      Home Medications    Prior to Admission medications   Medication Sig Start Date End Date Taking? Authorizing Provider  amLODipine (NORVASC) 10 MG tablet Take 1 tablet (10 mg total) by mouth daily. 05/28/17  Yes Lorella Nimrod, MD  hydrochlorothiazide (HYDRODIURIL) 25 MG  tablet Take 1 tablet (25 mg total) by mouth daily. 05/28/17  Yes Lorella Nimrod, MD  lisinopril (PRINIVIL,ZESTRIL) 40 MG tablet Take 1 tablet (40 mg total) by mouth daily. IM program. 05/28/17  Yes Lorella Nimrod, MD  metFORMIN (GLUCOPHAGE) 1000 MG tablet Take 1 tablet (1,000 mg total) by mouth 2 (two) times daily with a meal. IM program. 05/21/17 08/11/17 Yes Neva Seat, MD  spironolactone (ALDACTONE) 25 MG tablet Take 1 tablet (25 mg total) by mouth daily. IM Program 05/28/17 05/28/18 Yes Lorella Nimrod, MD    Family History Family History  Problem Relation Age of Onset  . Hypertension Mother   . Diabetes Mother   . Heart disease Mother   . Hypertension Father     Social History Social History   Tobacco Use  . Smoking status: Former Smoker    Years: 4.00    Types: Cigars    Last attempt to quit: 2015    Years since quitting: 4.4  . Smokeless tobacco: Never Used  Substance Use Topics  . Alcohol use: No  . Drug use: No     Allergies   Zithromax [azithromycin dihydrate]   Review of Systems Review of Systems  Constitutional: Positive for chills and fever.  HENT: Positive for ear pain and facial swelling. Negative for congestion and rhinorrhea.   Eyes: Positive for visual disturbance.  Respiratory: Negative for chest tightness and shortness of breath.   Cardiovascular: Positive for chest pain. Negative for leg swelling.  Gastrointestinal: Negative for nausea  and vomiting.  Genitourinary: Negative for difficulty urinating.  Musculoskeletal: Negative for neck pain and neck stiffness.  Skin: Negative for rash.  Neurological: Positive for headaches. Negative for dizziness, tremors, facial asymmetry, speech difficulty, weakness and numbness.     Physical Exam Updated Vital Signs BP (!) 226/139   Pulse (!) 113   Temp (!) 101.1 F (38.4 C) (Oral)   Resp (!) 22   SpO2 98%   Physical Exam  Constitutional: He appears well-developed and well-nourished. No distress.  HENT:   Patient exhibits right-sided facial edema.  Right ear is edematous with pain to palpation of mastoid as well as palpation of tragus and pinna.  Right external auditory canal is edematous and TM unable to be visualized.  Exudate present. Left ear without tenderness palpation of the mastoid, tragus, or pinna.  Left external auditory canal edematous, but without erythema or exudate.  Eyes: Pupils are equal, round, and reactive to light. Conjunctivae and EOM are normal.  Neck: Normal range of motion. Neck supple. No Brudzinski's sign noted.  Right-sided cervical adenopathy. No nuchal rigidity.  No meningismus.  Cardiovascular: Normal rate, regular rhythm, S1 normal and S2 normal.  No murmur heard. Pulses:      Radial pulses are 2+ on the right side, and 2+ on the left side.       Dorsalis pedis pulses are 2+ on the right side, and 2+ on the left side.  Pulmonary/Chest: Effort normal and breath sounds normal. He has no wheezes. He has no rales.  Abdominal: Soft. He exhibits no distension. There is no tenderness. There is no guarding.  Musculoskeletal: Normal range of motion. He exhibits no edema or deformity.  Lymphadenopathy:    He has cervical adenopathy.  Neurological: He is alert. GCS eye subscore is 4. GCS verbal subscore is 5. GCS motor subscore is 6.  Mental Status:  Alert, oriented, thought content appropriate, able to give a coherent history. Speech fluent without evidence of aphasia. Able to follow 2 step commands without difficulty.  Cranial Nerves:  II:  Peripheral visual fields grossly normal, pupils equal, round, reactive to light III,IV, VI: ptosis not present, extra-ocular motions intact bilaterally  V,VII: smile symmetric, facial light touch sensation equal VIII: hearing grossly normal to voice  X: uvula elevates symmetrically  XI: bilateral shoulder shrug symmetric and strong XII: midline tongue extension without fassiculations Motor:  Normal tone. 5/5 in upper and lower  extremities bilaterally including strong and equal grip strength and dorsiflexion/plantar flexion Sensory: Pinprick and light touch normal in all extremities.  Cerebellar: normal finger-to-nose with bilateral upper extremities Stance:No pronator drift and good coordination, strength, and position sense with tapping of bilateral arms (performed in sitting position). CV: distal pulses palpable throughout   Skin: Skin is warm and dry. No rash noted. No erythema.  Psychiatric: He has a normal mood and affect. His behavior is normal. Judgment and thought content normal.  Nursing note and vitals reviewed.    ED Treatments / Results  Labs (all labs ordered are listed, but only abnormal results are displayed) Labs Reviewed  BASIC METABOLIC PANEL - Abnormal; Notable for the following components:      Result Value   Potassium 3.2 (*)    Glucose, Bld 208 (*)    Creatinine, Ser 1.89 (*)    GFR calc non Af Amer 45 (*)    GFR calc Af Amer 52 (*)    All other components within normal limits  CBC WITH DIFFERENTIAL/PLATELET  RAPID URINE DRUG  SCREEN, HOSP PERFORMED  I-STAT TROPONIN, ED  I-STAT CG4 LACTIC ACID, ED    EKG EKG Interpretation  Date/Time:  Wednesday August 11 2017 18:55:23 EDT Ventricular Rate:  115 PR Interval:  160 QRS Duration: 88 QT Interval:  338 QTC Calculation: 467 R Axis:   -33 Text Interpretation:  Sinus tachycardia Biatrial enlargement Left axis deviation Abnormal ECG Confirmed by Dene Gentry [phone]) on 08/11/2017 10:47:40 PM   Radiology Dg Chest 2 View  Result Date: 08/11/2017 CLINICAL DATA:  37 year old male with history of left-sided chest pain shortness of breath for the past 3 days. EXAM: CHEST - 2 VIEW COMPARISON:  No priors. FINDINGS: Lung volumes are increased. No consolidative airspace disease. No pleural effusions. No evidence of pulmonary edema. Heart size appears borderline to mildly enlarged. Upper mediastinal contours are within normal limits.  IMPRESSION: 1. Hyperinflation of the lungs, which could be seen in setting of reactive airway disease. 2. Heart size is borderline enlarged mildly enlarged. Electronically Signed   By: Vinnie Langton M.D.   On: 08/11/2017 19:42    Procedures Procedures (including critical care time) CRITICAL CARE Performed by: Albesa Seen   Total critical care time: 35 minutes  Critical care time was exclusive of separately billable procedures and treating other patients.  Critical care was necessary to treat or prevent imminent or life-threatening deterioration.  Critical care was time spent personally by me on the following activities: development of treatment plan with patient and/or surrogate as well as nursing, discussions with consultants, evaluation of patient's response to treatment, examination of patient, obtaining history from patient or surrogate, ordering and performing treatments and interventions, ordering and review of laboratory studies, ordering and review of radiographic studies, pulse oximetry and re-evaluation of patient's condition.   Medications Ordered in ED Medications  HYDROcodone-acetaminophen (NORCO/VICODIN) 5-325 MG per tablet 2 tablet (has no administration in time range)  sodium chloride 0.9 % bolus 1,000 mL (has no administration in time range)  potassium chloride SA (K-DUR,KLOR-CON) CR tablet 40 mEq (has no administration in time range)  sodium chloride 0.9 % bolus 1,000 mL (has no administration in time range)     Initial Impression / Assessment and Plan / ED Course  I have reviewed the triage vital signs and the nursing notes.  Pertinent labs & imaging results that were available during my care of the patient were reviewed by me and considered in my medical decision making (see chart for details).   Clinical Course as of Aug 12 2243  Wed Aug 11, 2017  2136 Spoke with ED pharmacist Gabriel Cirri regarding patient's clinical presentation, and appropriate antibiotic  selection.   Will initiate vancomycin and cefepime at this time. Appreciate her consultation and collaboration.   [AM]  2214 Reassessed patient.  Blood pressure is coming down slowly.   [AM]    Clinical Course User Index [AM] Albesa Seen, PA-C   Patient is overall nontoxic-appearing, but has clinical features concerning for deterioration requiring admission to the hospital.  Differential diagnosis includes malignant otitis externa versus mastoiditis.  Doubt meningitis, as patient has no nuchal rigidity, meningismus signs, or concerning rashes.  Patient has clear source of infection in the right ear.  Do not suspect secondary source of patient's septic appearing picture.  Chest x-ray is clear of infiltrate.  Urinalysis demonstrates significant proteinuria but no infection.  Patient also presenting with blood pressure elevated >220/>120, with blurred vision and left-sided chest pain concerning for hypertensive urgency.  Delta troponin is negative.  EKG demonstrating  leftward axis.  Patient has no focal neurologic symptoms.  Blood pressure reduction achieved with pain control and labetalol, patient to receive continued doses if he is persistently hypertensive.   work-up significant for worsening creatinine, see below for trend.  Patient does not exhibit leukocytosis.  Lactic acid is normal.  Potassium 3.2, corrected with oral repletion. Awaiting ESR/CRP.  Lab Results  Component Value Date   CREATININE 1.89 (H) 08/11/2017   CREATININE 1.67 (H) 08/17/2016   CREATININE 1.61 (H) 08/07/2016   Broad-spectrum antibiotics administered to cover Pseudomonas and MRSA, person pharmacy discussion.  Care signed out to Melina Schools, PA-C at 10:44 PM.  Awaiting CT temporal bones and CT noncontrast of the head prior to admission.  Final Clinical Impressions(s) / ED Diagnoses   Final diagnoses:  Otalgia of both ears  Right-sided headache  Hypertensive urgency    ED Discharge Orders    None         Tamala Julian 08/11/17 2301    Daleen Bo, MD 08/12/17 1325

## 2017-08-11 NOTE — ED Notes (Signed)
Patient transported to CT 

## 2017-08-11 NOTE — ED Provider Notes (Signed)
Patient placed in Quick Look pathway, seen and evaluated   Chief Complaint: Right ear pain, lightheadedness, facial swelling  HPI:   37 year old male who presents for evaluation of persistent right ear pain, right-sided facial swelling that is been ongoing for last 3 days.  Patient reports he has a intermittent history of swimmer's ear and states that a few weeks ago his left ear started hurting.  He started using over-the-counter eardrops from [facility].  Patient reports that it had improved but states that 3 days ago, his right ear started hurting.  Patient reports that today, he was at a client's house and states that he got very lightheaded, felt like he was going to pass out and started having some chest pain.  Patient came to ED for further evaluation.  He states that over the last 3 days, he felt like his left side of his face has been swollen and is more painful.  He denies any dental pain.  He states he has not had any fever at home.  Patient does have a history of hypertension and reports that he is currently on 5 blood pressure medications to help control his blood pressure.  Patient reports he took his medications prior to ED to arrival.  Patient denies any difficulty breathing, difficulty swelling, dental pain  ROS: Ear pain, facial swelling, CP, lightheadedness.   Physical Exam:   Gen: No distress  Neuro: Awake and Alert  Skin: Warm    Focused Exam: Unable to assess TMs bilaterally due to external auditory canal swelling.  No gingival redness, warmth, erythema over the mastoid process bilaterally airways patent, phonation is intact.  Lungs clear to auscultation bilaterally.  Regular rate and rhythm.  No obvious dental abscess.   Initiation of care has begun. The patient has been counseled on the process, plan, and necessity for staying for the completion/evaluation, and the remainder of the medical screening examination    Desma Mcgregor 08/11/17 1858    Lacretia Leigh,  MD 08/11/17 2307

## 2017-08-11 NOTE — Progress Notes (Signed)
Pharmacy Antibiotic Note  Harold Clements is a 37 y.o. male admitted on 08/11/2017 with cellulitis.  Pharmacy has been consulted for vancomycin and cefepime dosing.  Patient presents to ED with right ear pain, lightheadedness, CP, and facial swelling ongoing for the last 3 days. Concern for mastoiditis or malignant otitis externa, but given patient presentation a code sepsis was called.    WBC wnl, afebrile, lactic acid wnl. ~nCrCl ~50-55 ml/min  Plan: Vancomycin 2500 mg  IV x 1 Vancomycin 1000 mg IV Q12h Cefepime 2 g IV q12h Monitor clinical progressions, BCx, renal function, and LOT De-escalate when able  Weight: 267 lb (121.1 kg)  Temp (24hrs), Avg:100.8 F (38.2 C), Min:100.4 F (38 C), Max:101.1 F (38.4 C)  Recent Labs  Lab 08/11/17 1911 08/11/17 1919  WBC 8.9  --   CREATININE 1.89*  --   LATICACIDVEN  --  1.21    Estimated Creatinine Clearance: 70.8 mL/min (A) (by C-G formula based on SCr of 1.89 mg/dL (H)).    Allergies  Allergen Reactions  . Zithromax [Azithromycin Dihydrate] Anaphylaxis    Antimicrobials this admission: 6/12 vancomycin >>  6/12 cefepime >>  Dose adjustments this admission:   Microbiology results: 6/12 BCx:    Harold Clements, PharmD Pharmacy Resident

## 2017-08-11 NOTE — ED Triage Notes (Signed)
Patient to ED c/o R ear pain, headache, and generalized weakness x 2 days. Pt states both ears started hurting, but the left "healed itself." Patient has hx HTN and states he took his medications for today, but "they haven't had time to kick in yet." Patient denies cough, no vision changes.

## 2017-08-12 ENCOUNTER — Encounter (HOSPITAL_COMMUNITY): Payer: Self-pay | Admitting: *Deleted

## 2017-08-12 ENCOUNTER — Other Ambulatory Visit: Payer: Self-pay

## 2017-08-12 DIAGNOSIS — Z87891 Personal history of nicotine dependence: Secondary | ICD-10-CM | POA: Diagnosis not present

## 2017-08-12 DIAGNOSIS — R0602 Shortness of breath: Secondary | ICD-10-CM | POA: Diagnosis present

## 2017-08-12 DIAGNOSIS — N183 Chronic kidney disease, stage 3 (moderate): Secondary | ICD-10-CM | POA: Diagnosis present

## 2017-08-12 DIAGNOSIS — R809 Proteinuria, unspecified: Secondary | ICD-10-CM | POA: Diagnosis present

## 2017-08-12 DIAGNOSIS — Z881 Allergy status to other antibiotic agents status: Secondary | ICD-10-CM | POA: Diagnosis not present

## 2017-08-12 DIAGNOSIS — E1122 Type 2 diabetes mellitus with diabetic chronic kidney disease: Secondary | ICD-10-CM | POA: Diagnosis present

## 2017-08-12 DIAGNOSIS — H9193 Unspecified hearing loss, bilateral: Secondary | ICD-10-CM | POA: Diagnosis present

## 2017-08-12 DIAGNOSIS — Z7984 Long term (current) use of oral hypoglycemic drugs: Secondary | ICD-10-CM | POA: Diagnosis not present

## 2017-08-12 DIAGNOSIS — H7090 Unspecified mastoiditis, unspecified ear: Secondary | ICD-10-CM | POA: Diagnosis present

## 2017-08-12 DIAGNOSIS — I129 Hypertensive chronic kidney disease with stage 1 through stage 4 chronic kidney disease, or unspecified chronic kidney disease: Secondary | ICD-10-CM

## 2017-08-12 DIAGNOSIS — E1165 Type 2 diabetes mellitus with hyperglycemia: Secondary | ICD-10-CM | POA: Diagnosis present

## 2017-08-12 DIAGNOSIS — H7093 Unspecified mastoiditis, bilateral: Secondary | ICD-10-CM | POA: Diagnosis present

## 2017-08-12 DIAGNOSIS — R9431 Abnormal electrocardiogram [ECG] [EKG]: Secondary | ICD-10-CM | POA: Diagnosis present

## 2017-08-12 DIAGNOSIS — Z79899 Other long term (current) drug therapy: Secondary | ICD-10-CM | POA: Diagnosis not present

## 2017-08-12 DIAGNOSIS — H7091 Unspecified mastoiditis, right ear: Secondary | ICD-10-CM

## 2017-08-12 DIAGNOSIS — H6091 Unspecified otitis externa, right ear: Secondary | ICD-10-CM | POA: Diagnosis present

## 2017-08-12 DIAGNOSIS — R51 Headache: Secondary | ICD-10-CM | POA: Diagnosis not present

## 2017-08-12 DIAGNOSIS — I160 Hypertensive urgency: Secondary | ICD-10-CM | POA: Diagnosis present

## 2017-08-12 DIAGNOSIS — R000 Tachycardia, unspecified: Secondary | ICD-10-CM | POA: Diagnosis present

## 2017-08-12 DIAGNOSIS — Z9114 Patient's other noncompliance with medication regimen: Secondary | ICD-10-CM | POA: Diagnosis not present

## 2017-08-12 DIAGNOSIS — R0789 Other chest pain: Secondary | ICD-10-CM | POA: Diagnosis present

## 2017-08-12 LAB — BASIC METABOLIC PANEL
ANION GAP: 10 (ref 5–15)
BUN: 15 mg/dL (ref 6–20)
CO2: 25 mmol/L (ref 22–32)
Calcium: 8.7 mg/dL — ABNORMAL LOW (ref 8.9–10.3)
Chloride: 102 mmol/L (ref 101–111)
Creatinine, Ser: 1.64 mg/dL — ABNORMAL HIGH (ref 0.61–1.24)
GFR calc Af Amer: >60 mL/min (ref >60)
GFR, EST NON AFRICAN AMERICAN: 53 mL/min — AB (ref >60)
GLUCOSE: 198 mg/dL — AB (ref 65–99)
POTASSIUM: 3.5 mmol/L (ref 3.5–5.1)
SODIUM: 137 mmol/L (ref 135–145)

## 2017-08-12 LAB — GLUCOSE, CAPILLARY
GLUCOSE-CAPILLARY: 195 mg/dL — AB (ref 65–99)
Glucose-Capillary: 170 mg/dL — ABNORMAL HIGH (ref 65–99)
Glucose-Capillary: 229 mg/dL — ABNORMAL HIGH (ref 65–99)
Glucose-Capillary: 150 mg/dL — ABNORMAL HIGH (ref 65–99)

## 2017-08-12 LAB — CBC
HEMATOCRIT: 40.7 % (ref 39.0–52.0)
HEMOGLOBIN: 13.6 g/dL (ref 13.0–17.0)
MCH: 26.3 pg (ref 26.0–34.0)
MCHC: 33.4 g/dL (ref 30.0–36.0)
MCV: 78.7 fL (ref 78.0–100.0)
Platelets: 238 10*3/uL (ref 150–400)
RBC: 5.17 MIL/uL (ref 4.22–5.81)
RDW: 13.1 % (ref 11.5–15.5)
WBC: 8.5 10*3/uL (ref 4.0–10.5)

## 2017-08-12 LAB — HIV ANTIBODY (ROUTINE TESTING W REFLEX): HIV SCREEN 4TH GENERATION: NONREACTIVE

## 2017-08-12 LAB — CBG MONITORING, ED: GLUCOSE-CAPILLARY: 197 mg/dL — AB (ref 65–99)

## 2017-08-12 LAB — HEMOGLOBIN A1C
HEMOGLOBIN A1C: 9.1 % — AB (ref 4.8–5.6)
MEAN PLASMA GLUCOSE: 214.47 mg/dL

## 2017-08-12 LAB — C-REACTIVE PROTEIN: CRP: 8 mg/dL — AB (ref <1.0)

## 2017-08-12 LAB — SEDIMENTATION RATE: Sed Rate: 25 mm/hr — ABNORMAL HIGH (ref 0–16)

## 2017-08-12 MED ORDER — ACETAMINOPHEN 650 MG RE SUPP: 650.0000 mg | Freq: Four times a day (QID) | RECTAL | Status: DC | PRN

## 2017-08-12 MED ORDER — ACETAMINOPHEN 325 MG PO TABS
650.0000 mg | ORAL_TABLET | Freq: Four times a day (QID) | ORAL | Status: DC | PRN
  Administered 2017-08-12 – 2017-08-13 (×4): 650 mg via ORAL
  Filled 2017-08-12 (×4): qty 2

## 2017-08-12 MED ORDER — CIPROFLOXACIN-DEXAMETHASONE 0.3-0.1 % OT SUSP
4.0000 [drp] | Freq: Two times a day (BID) | OTIC | Status: DC
  Administered 2017-08-12 – 2017-08-13 (×2): 4 [drp] via OTIC
  Filled 2017-08-12: qty 7.5

## 2017-08-12 MED ORDER — LACTATED RINGERS IV SOLN
INTRAVENOUS | Status: AC
  Administered 2017-08-12: 04:00:00 via INTRAVENOUS

## 2017-08-12 MED ORDER — CIPROFLOXACIN HCL 500 MG PO TABS
500.0000 mg | ORAL_TABLET | Freq: Two times a day (BID) | ORAL | Status: DC
  Administered 2017-08-12 – 2017-08-13 (×3): 500 mg via ORAL
  Filled 2017-08-12 (×3): qty 1

## 2017-08-12 MED ORDER — LIVING WELL WITH DIABETES BOOK
Freq: Once | Status: AC
  Administered 2017-08-12: 17:00:00
  Filled 2017-08-12: qty 1

## 2017-08-12 MED ORDER — CIPROFLOXACIN-DEXAMETHASONE 0.3-0.1 % OT SUSP
4.0000 [drp] | Freq: Two times a day (BID) | OTIC | Status: DC
  Administered 2017-08-12: 4 [drp] via OTIC
  Filled 2017-08-12: qty 7.5

## 2017-08-12 MED ORDER — ENOXAPARIN SODIUM 60 MG/0.6ML ~~LOC~~ SOLN
60.0000 mg | Freq: Every day | SUBCUTANEOUS | Status: DC
  Administered 2017-08-12 – 2017-08-13 (×2): 60 mg via SUBCUTANEOUS
  Filled 2017-08-12 (×2): qty 0.6

## 2017-08-12 MED ORDER — HYDROCHLOROTHIAZIDE 25 MG PO TABS
25.0000 mg | ORAL_TABLET | Freq: Every day | ORAL | Status: DC
  Administered 2017-08-12 – 2017-08-13 (×2): 25 mg via ORAL
  Filled 2017-08-12 (×2): qty 1

## 2017-08-12 MED ORDER — METRONIDAZOLE IN NACL 5-0.79 MG/ML-% IV SOLN
500.0000 mg | Freq: Three times a day (TID) | INTRAVENOUS | Status: DC
  Administered 2017-08-12: 500 mg via INTRAVENOUS
  Filled 2017-08-12: qty 100

## 2017-08-12 MED ORDER — ENOXAPARIN SODIUM 40 MG/0.4ML ~~LOC~~ SOLN: 40.0000 mg | Freq: Every day | SUBCUTANEOUS | Status: DC

## 2017-08-12 MED ORDER — AMLODIPINE BESYLATE 5 MG PO TABS
10.0000 mg | ORAL_TABLET | Freq: Every day | ORAL | Status: DC
  Administered 2017-08-12 – 2017-08-13 (×2): 10 mg via ORAL
  Filled 2017-08-12 (×2): qty 2

## 2017-08-12 MED ORDER — INSULIN ASPART 100 UNIT/ML ~~LOC~~ SOLN: 0.0000 [IU] | Freq: Every day | SUBCUTANEOUS | Status: DC

## 2017-08-12 MED ORDER — INSULIN ASPART 100 UNIT/ML ~~LOC~~ SOLN
0.0000 [IU] | Freq: Three times a day (TID) | SUBCUTANEOUS | Status: DC
  Administered 2017-08-12: 3 [IU] via SUBCUTANEOUS
  Administered 2017-08-12: 5 [IU] via SUBCUTANEOUS
  Administered 2017-08-12: 3 [IU] via SUBCUTANEOUS
  Administered 2017-08-13: 5 [IU] via SUBCUTANEOUS
  Administered 2017-08-13: 3 [IU] via SUBCUTANEOUS

## 2017-08-12 MED ORDER — SODIUM CHLORIDE 0.9% FLUSH
3.0000 mL | Freq: Two times a day (BID) | INTRAVENOUS | Status: DC
  Administered 2017-08-12 – 2017-08-13 (×4): 3 mL via INTRAVENOUS

## 2017-08-12 MED ORDER — LISINOPRIL 20 MG PO TABS
40.0000 mg | ORAL_TABLET | Freq: Every day | ORAL | Status: DC
  Administered 2017-08-12 – 2017-08-13 (×2): 40 mg via ORAL
  Filled 2017-08-12 (×2): qty 2

## 2017-08-12 NOTE — ED Provider Notes (Signed)
Care assumed from previous provider PA [facility]. Please see their note for further details to include full history and physical. To summarize in short pt is a 37 year old male history of diabetes, hypertension that presents to the ED with right ear pain and drainage.  Patient also reports fever, blurry vision and headache with hypertension is not compliant with medications.. Case discussed, plan agreed upon.   At time of care handoff was awaiting CT scan to rule out mastoiditis.  This is confirmed and not positive for right-sided mastoiditis and otitis externa.  Patient was already initiated on antibiotics.  Fever has improved.  Patient heart rate has improved.  Patient was extremely hypertensive.  Prior provider gave labetalol.  Patient's blood pressure has trended down.  Last blood pressure was 176/112.  Patient sleeping on my examination.  Will consult internal medicine teaching service for admission.  Spoke with Angie with internal medicine teaching service who agrees to admission will see patient in the ED and place admission orders.  Patient was updated on plan of care.  He remains hemodynamic stable.       Doristine Devoid, PA-C 08/12/17 Heriberto [place]    Daleen Bo, MD 08/12/17 1325

## 2017-08-12 NOTE — Progress Notes (Addendum)
Subjective:  Harold Clements was seen lying comfortably in bed this morning. He endorses continued right ear pain, drainage, and swelling. He also has a right-sided headache unchanged from yesterday. He reports decreased hearing bilaterally. He denies any chest pain, shortness of breath, nausea, vomiting, fevers, chills, or focal weakness. He says that he has had recurrent episodes of otitis externa over the past 2 years and the episodes are triggered by moisture such as rain. He has seen an ENT in the past but is not established with one for regular check-ups. He does not use q-tips and has not been swimming recently.    Objective:  Vital signs in last 24 hours: Vitals:   08/12/17 0045 08/12/17 0130 08/12/17 0500 08/12/17 0559  BP: (!) 164/103 (!) 196/125  (!) 185/110  Pulse: 87 84  81  Resp: (!) 23 (!) 21  20  Temp:  98.1 F (36.7 C)  98.4 F (36.9 C)  TempSrc:  Oral  Oral  SpO2: 97% 99%  99%  Weight:   121.5 kg (267 lb 13.7 oz)    Weight change:   Intake/Output Summary (Last 24 hours) at 08/12/2017 1427 Last data filed at 08/12/2017 1146 Gross per 24 hour  Intake 540 ml  Output 1150 ml  Net -610 ml   Constitutional: He is oriented to person, place, and time. No acute distress.  HEENT: TM unable to be visualized 2/2 swelling and debris in R auditory canal, which is completely obscured, and L auditory canal, which is >80% obscured. Tenderness with movement of R pinna and palpation of the R mastoid bone. Bilateral preauricaular swelling and tenderness with palpation noted, R>L. Cardiovascular: Normal rate, regular rhythm and intact distal pulses. No murmurs, rubs, or gallops. Respiratory: Effort normal. No respiratory distress. LCTAB. He has no wheezes. He has no rales.  GI: Soft, non-distended. Normoactive bowel sounds. No tenderness to palpation. Neurological: PERRLA. EOM's intact. No facial nerve deficits. Moves all extremities spontaneously. Sensation to light touch intact  bilaterally.  Skin: Skin is warm and dry. No rash noted. No erythema.    Assessment/Plan:  Active Problems: Right otitis externa/media with mastoiditis HTN CKD, stage 3A T2DM  Harold Clements is a 37 year old male with PMH of recurrent otitis externa, uncontrolled HTN, T2DM, and CKD stage 3A who presents with 3 days of worsening right-sided ear pain, drainage and swelling. Workup concerning for acute right sided otitis externa/media with mastoiditis as well as a resolving left otitis externa.   Right otitis externa/media with R mastoiditis- Fever has resolved but this morning's exam is still notable for swelling of the right preauricular area and the auditory canal as well as tenderness over the right mastoid bone. ENT placed a wick catheter today and cleared for discharge from an infectious disease standpoint on PO ciprofloxacin and ciprodex drops. -Stop Vancomycin and cefepime -Start PO Ciprofloxacin -Continue ciprodex drops (4 drops BID in both ears) -F/u to be scheduled with ENT for next Tuesday  HTN- BP>220/140 on admission. Home regimen includes lisinopril, HCTZ, amlodipine, and spironolactone but he reports that he often forgets to take them. SBPs ranging 160s-190s overnight and this morning after labetalol 10 mg x2 and restarting home lisinopril, HCTZ, amlodipine. He remains asymptomatic. -Continue lisinopril, HCTZ, and amlodipine with SBP goal <180, will add spironolactone back if remains elevated -Given chronic elevation, would not increase treatment if alreadydown to 160s SBP -Continue to monitor closely and f/u outpatient  Chronic Kidney Disease, Stage 3A- Patient's Cr on admission elevated to  1.89, with eGFR ~50, and he has known history of CKD 3A likely secondary to chronic uncontrolled hypertension and diabetes. Slightly improved to Cr 1.64 and GFR>60 this morning. -Continue to monitor while inpatient -BMP in AM -F/u outpatient   T2DM- HbA1c 9.1 this morning. Home regimen is  metformin 1000 mg BID. Has required a total 8u SSI overnight and this morning, fasting glucose levels ranging 170-229.  -CBG monitoring TID with Meals, qHS -SSI TID with meals, qHS -F/u outpatient    FEN: Carb modified diet, replete lytes prn VTE ppx: Lovenox  Code Status: FULL   Dispo: Anticipate discharge in the morning pending stable vitals and good outpatient f/u plan.   LOS: 0 days   Thornton Papas, Medical Student 08/12/2017, 2:27 PM Pager: [phone]    Attestation for Student Documentation:  I personally was present and performed or re-performed the history, physical exam and medical decision-making activities of this service and have verified that the service and findings are accurately documented in the student's note.  Collier Salina, MD PGY-III Internal Medicine Resident Pager# [phone] 08/12/2017, 4:09 PM

## 2017-08-12 NOTE — H&P (Signed)
Date: 08/12/2017               Patient Name:  Harold Clements MRN: [id number]  DOB: 1980/05/14 Age / Sex: 37 y.o., male   PCP: Shela Leff, MD         Medical Service: Internal Medicine Teaching Service         Attending Physician: Dr. Lucious Groves, DO    First Contact: Dr. Pearson Grippe Pager: [phone]  Second Contact: Dr. Einar Gip Pager: [phone]       After Hours (After 5p/  First Contact Pager: [phone]  weekends / holidays): Second Contact Pager: [phone]   Chief Complaint: R ear pain  History of Present Illness:  Harold Clements is a 37 yo with PMH of uncontrolled HTN and T2DM who presents for evaluation of R sided ear pain for the past 3 days. History obtained directly from the patient and wife who was beside. Patient states he was in his usual state of health about 7-10 days prior to admission when he started to notice some L sided ear pain. Patient states that his L ear became swollen, painful and it was difficult to hear. His L ear pain was associated with some L facial swelling. After about 1 week the patient noticed some draining of yellow material from his ear and the pain/swelling began to go away. He then began to notice a similar, but slightly more intense pain in his R ear. For the past 3 days patient notes that the pain and swelling of R ear has significantly worsened (the intensity of this is worse than on the left). Patient states that the swelling is so bad that he can no longer hear out of the right ear. Patient also notes increased frontal headache on R side of head as ear pain progressed. This headache is intermittent and not associated with photophobia, nausea, or vomiting. 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 While at work the patient felt as if he was going to "pass out" and became all over weak. He noticed some shortness of breath  and L sided chest pain that worsened with deep inspiration around the same time he thought he was going to pass out, which have since resolved. Patient became worried about constellation of symptoms so he went the ED for evaluation.   Patient has experienced ear infections before, usually gets one per year for the last few years. His last ear infection was treated by an ENT doctor per wife report. Per chart review patient has history of uncomplicated otitis externa in 2016 and was treated with ear drops and oral antibiotics. Patient thinks ear infections are precipitated by increased moisture in his ears and notes that he gets infections whenever he is out in the rain for a Norman period of time.   Upon arrival to the ED patient was febrile with T max of 38.4C, tachycardic into 110s, hypertensive to 220-240s/120-140 and stable on room air. CBC with WBC 8.9, Hgb 14.3. BMP with Cr 1.89 (1.6 in 2018) and K 3.2. ISTAT troponin 0.02, with repeat 4 hours later of 0.02. ISTAT lactate 1.21, with 1.74 on repeat. Urinalysis signficant for glucose 150, proteinuria (seen on prior UA), and small hemoglobin. CT head and temporal bones revealed no acute intracranial abnormality but signs of R sided otitis externa and otomastoiditis without  abscess. A left sided mastoid effusion with mild soft tissue thickening in external canal was also observed. CXR showed mild cardiomegaly and hyperinflation, but no other acute abnormalities. Given tachycardia, fever, obvious source of infection, and hypertension patient met septic criteria on admission. He was subsequently given IV vancomycin, zosyn, NS bolus x2, and blood cultures were obtained. Patient was also given 2 doses of IV labetolol, 10 mg for management of hypertension. IMTS was called for admission.   Meds:  Current Meds  Medication Sig  . amLODipine (NORVASC) 10 MG tablet Take 1 tablet (10 mg total) by mouth daily.  . hydrochlorothiazide (HYDRODIURIL) 25 MG tablet Take 1  tablet (25 mg total) by mouth daily.  Marland Kitchen lisinopril (PRINIVIL,ZESTRIL) 40 MG tablet Take 1 tablet (40 mg total) by mouth daily. IM program.  . metFORMIN (GLUCOPHAGE) 1000 MG tablet Take 1 tablet (1,000 mg total) by mouth 2 (two) times daily with a meal. IM program.  . spironolactone (ALDACTONE) 25 MG tablet Take 1 tablet (25 mg total) by mouth daily. IM Program   Allergies: Allergies as of 08/11/2017 - Review Complete 08/11/2017  Allergen Reaction Noted  . Zithromax [azithromycin dihydrate] Anaphylaxis 05/17/2011   Past Medical History: Past Medical History:  Diagnosis Date  . Hypertension   . Pre-diabetes   . Wasp sting 08/04/2016   of dorsum of left hand near thumb   Family History:  Family History  Problem Relation Age of Onset  . Hypertension Mother   . Diabetes Mother   . Heart disease Mother   . Hypertension Father    Social History:  Patient works as Museum/gallery curator. Lives with wife and daughter. Former cigar smoker (quit ~ 4 years ago per chart review). No alcohol or drugs.   Review of Systems: A complete ROS was negative except as per HPI.   Physical Exam: Blood pressure (!) 164/103, pulse 87, temperature 98.4 F (36.9 C), temperature source Oral, resp. rate (!) 23, weight 267 lb (121.1 kg), SpO2 97 %.  Physical Exam  Constitutional: He is oriented to person, place, and time.  Obese gentleman laying comfortably in ED stretcher slightly diaphoretic, but in no acute distress  HENT:  TM unable to be visualized 2/2 swelling and debris in R auditory canal, which is completely obscured, and L auditory canal, which is >80% obscured. Tenderness with movement of R ear. Bilateral preauricaular swelling and tenderness with palpation noted, R>L. Oropharynx dry with dry lips.  Eyes: Pupils are equal, round, and reactive to light. Conjunctivae and EOM are normal.  Cardiovascular: Normal rate, regular rhythm and intact distal pulses. Exam reveals no friction rub.  No murmur  heard. Respiratory: Effort normal. No respiratory distress. He has no wheezes. He has no rales.  GI: Soft. Bowel sounds are normal. He exhibits no distension. There is no tenderness. There is no rebound.  Musculoskeletal: He exhibits no edema (of bilateral lower extremities) or tenderness (of bilateral lower extremities).  Lymphadenopathy:    He has no cervical adenopathy.  Neurological: He is alert and oriented to person, place, and time.  Face strength and sensation intact bilaterally. Tongue midline. Gross motor and sensation to light touch of upper and lower extremities intact bilaterally.   Skin: Skin is warm and dry. No rash noted. No erythema.   EKG: personally reviewed my interpretation is sinus tachycardia with LA enlargement, borderline LVH, and LAD. No ST elevation or TWI to suggest ischemia.   CXR: personally reviewed my interpretation is no vascular congestion, opacities, or pleural  effusions to suggest acute cardiopulmonary process.  Assessment & Plan by Problem: Active Problems:   Mastoiditis  Guy Seese is a 37 yo with PMH of uncontrolled HTN and T2DM who presented for evaluation of R sided ear pain. Patient was febrile on admission with evidence of R otitis externa and L mastoid effusion on imaging. Patient admitted to the internal medicine teaching service for management. The specific problems addressed during admission are as follows:  R otitis externa with mastoiditis, L mastoiditis: Patient presenting with fever and R ear pain. Initial CT scan and physical exam consistent with severe R otitis externa as the patient has preauricular swelling on exam, pain with movement of R ear, and complete blockage of external auditory canal (with resultant decrease in hearing). Patient also has evidence of mild otitis externa on L side as well. Patient will need topical antibiotics to treat this infection and will initiate this on transfer to the floor. Given evidence bilateral mastoiditis  on imaging, will continue IV antibiotics until ENT consult in AM. Will keep on broad spectrum antibiotics to cover for MRSA and pseudomonas, which are recommended in cases of chronic mastoiditis for additional antimicrobial coverage in addition to usual strep pneumonae and H influenza organisms, given the reported recurrent nature of the patient's ear infections prior to presentation. Hopefully patient's antibiotic regimen can be narrowed to just anti-pseudomonal cephalosporin if ENT does not think additional MRSA coverage needed for mastoiditis.  -Admit to telemetry -ENT consult in AM -Continue IV antibiotics (vancomycin, cefepime) -Start ciprofloxacin, dexamethasone ear drops bilaterally for tx of otitis externa, wonder if patient will need wick placement to assist with delivery of these external antibiotics on R side -> will defer to ENT in AM -F/u blood cultures taken in ED -Tylenol PRN for pain, fever -LR @ rate of 100 ml/hr x10 hours -CBC in AM  Hypertensive urgency: BP on arrival >220/140 on multiple readings.  Patient with history of hypertension and poor compliance as outpatient with oral medication regimen, stating that he only takes medications sporadically. Last two clinic visits per chart review with BP 190s/110s at both visits. Given chronic hypertension at baseline and no evidence of end organ damage, goal to slowly reduce BP within first 24 hours ~20% to SBP = 190. Patient received IV labetolol 10 mg x2 in ED with BP drop to systolic 106Y. Will not give additional IV medications but rather continue to monitor on outpatient oral regimen of amlodipine 10 mg, HCTZ 25 mg and lisinopril 40 mg daily. Per chart review patient recently started on spironolactone as outpatient. Patient hasn't been taking this medication, will hold this for now to see how BP does with triple therapy and add back as needed while inpatient.  -Continue to monitor -Resume lisinopril 40 mg, HCTZ 25 mg, amlodipine 10 mg  daily -Add spironolactone 25 mg daily if needed  Chronic Kidney Disease, Stage 3A: Patient's Cr on admission elevated to 1.89, with eGFR ~50. Patient's last recorded value about 1 year prior with Cr 1.6 and eGFR >60. Suspect change in Cr 2/2 ongoing poorly controlled hypertension rather than AKI 2/2 hypertensive emergency.  -Continue to monitor while inpatient -BMP in AM  T2DM: BG elevated to >200 on admission. No acidosis, anion gap, or ketonuria to suggest DKA. Patient on metformin 1000 mg BID as outpatient, with last A1C in 05/21/2017 = 8.9% suggesting poor overall control. Will plan to hold metformin while inpatient and manage diabetes with SSI to hospital goal of <180 while inpatient.  -Hemoglobin  A1C in AM -CBG monitoring TID with Meals, qHS -SSI TID with meals, qHS  FEN/GI: -Heart Healthy/Carb Modified diet -LR @ 100 ml/hr x10 hours, s/p 40 mEq K+ in ED  VTE Prophylaxis: Lovenox daily Code Status: Full  Dispo: Admit patient to Inpatient with expected length of stay greater than 2 midnights.  SignedThomasene Ripple, MD 08/12/2017, 2:33 AM  Pager: [phone]

## 2017-08-12 NOTE — Progress Notes (Addendum)
Inpatient Diabetes Program Recommendations  AACE/ADA: New Consensus Statement on Inpatient Glycemic Control (2015)  Target Ranges:  Prepandial:   less than 140 mg/dL      Peak postprandial:   less than 180 mg/dL (1-2 hours)      Critically ill patients:  140 - 180 mg/dL   Lab Results  Component Value Date   GLUCAP 170 (H) 08/12/2017   HGBA1C 9.1 (H) 08/12/2017    Review of Glycemic ControlResults for NGOC, DAUGHTRIDGE (MRN [id number]) as of 08/12/2017 11:19  Ref. Range 08/12/2017 01:37 08/12/2017 06:41  Glucose-Capillary Latest Ref Range: 65 - 99 mg/dL 197 (H) 170 (H)    Diabetes history: Type 2 DM  Outpatient Diabetes medications:  Metformin 1000 mg bid Current orders for Inpatient glycemic control:  Novolog moderate tid with meals and HS  Inpatient Diabetes Program Recommendations:    A1C increased > goal.  May need adjustment in outpatient regimen.  Will talk to patient regarding A1C.   Thanks,  Adah Perl, RN, BC-ADM Inpatient Diabetes Coordinator Pager [phone] (8a-5p)   Addendum:  Spoke with patient regarding elevated A1C.  He sees MD's at internal medicine clinic.  We discussed his A1C levels and goal A1C.  He does not have a meter. Discussed importance of glycemic control and f/u with PCP.  He does take metformin but states he sometimes forget doses.  He feels that he does well with his diet and exercise.  Will order Living well with DM booklet. Consider having patient f/u with Barnabas Harries, CDE after d/c at IM clinic.

## 2017-08-12 NOTE — Consult Note (Signed)
Reason for Consult:ear infection Referring Physician: hospitalist  Harold Clements is an 37 y.o. male.  HPI: hx of OE and now with another episode. It started on the left and that is now better. He started with pain in the right side about 3 days ago. He was admitted apparently for HTN and chest pain. He cannot hear well out of the right ear. He has no vertigo. No facial nerve weakness. He has required a wick in the past. Ct scan showed opacified right canal and some otomastoid disease. Left with mild otomastoid disease.   Past Medical History:  Diagnosis Date  . 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 Diabetes mellitus without complication ([place])   . Ear pain 07/2017  . Hypertension   . Wasp sting 08/04/2016   of dorsum of left hand near thumb    History reviewed. No pertinent surgical history.  Family History  Problem Relation Age of Onset  . Hypertension Mother   . Diabetes Mother   . Heart disease Mother   . Hypertension Father     Social History:  reports that he quit smoking about 4 years ago. His smoking use included cigars. He quit after 4.00 years of use. He has never used smokeless tobacco. He reports that he does not drink alcohol or use drugs.  Allergies:  Allergies  Allergen Reactions  . Zithromax [Azithromycin Dihydrate] Anaphylaxis    Medications: I have reviewed the patient's current medications.  Results for orders placed or performed during the hospital encounter of 08/11/17 (from the past 48 hour(s))  Basic metabolic panel     Status: Abnormal   Collection Time: 08/11/17  7:11 PM  Result Value Ref Range   Sodium 138 135 - 145 mmol/L   Potassium 3.2 (L) 3.5 - 5.1 mmol/L   Chloride 103 101 - 111 mmol/L   CO2 22 22 - 32 mmol/L   Glucose, Bld 208 (H) 65 - 99 mg/dL   BUN 20 6 - 20 mg/dL   Creatinine, Ser 1.89 (H) 0.61 - 1.24 mg/dL   Calcium 9.1 8.9 - 10.3 mg/dL   GFR calc non Af Amer 45 (L) >60 mL/min   GFR calc Af Amer 52 (L) >60 mL/min     Comment: (NOTE) The eGFR has been calculated using the CKD EPI equation. This calculation has not been validated in all clinical situations. eGFR's persistently <60 mL/min signify possible Chronic Kidney Disease.    Anion gap 13 5 - 15    Comment: Performed at [facility] [address], [place], [place] [address]  CBC with Differential     Status: None   Collection Time: 08/11/17  7:11 PM  Result Value Ref Range   WBC 8.9 4.0 - 10.5 K/uL   RBC 5.45 4.22 - 5.81 MIL/uL   Hemoglobin 14.3 13.0 - 17.0 g/dL   HCT 43.6 39.0 - 52.0 %   MCV 80.0 78.0 - 100.0 fL   MCH 26.2 26.0 - 34.0 pg   MCHC 32.8 30.0 - 36.0 g/dL   RDW 13.2 11.5 - 15.5 %   Platelets 249 150 - 400 K/uL   Neutrophils Relative % 78 %   Neutro Abs 6.9 1.7 - 7.7 K/uL   Lymphocytes Relative 17 %   Lymphs Abs 1.5 0.7 - 4.0 K/uL   Monocytes Relative 5 %   Monocytes Absolute 0.5 0.1 - 1.0 K/uL   Eosinophils Relative 0 %   Eosinophils Absolute 0.0 0.0 -  0.7 K/uL   Basophils Relative 0 %   Basophils Absolute 0.0 0.0 - 0.1 K/uL   Immature Granulocytes 0 %   Abs Immature Granulocytes 0.0 0.0 - 0.1 K/uL    Comment: Performed at [facility], [place] [address] [place], [place] [address]  Sedimentation rate     Status: Abnormal   Collection Time: 08/11/17  7:11 PM  Result Value Ref Range   Sed Rate 25 (H) 0 - 16 mm/hr    Comment: Performed at [facility] [address], [place], [place] [address]  I-Stat CG4 Lactic Acid, ED     Status: None   Collection Time: 08/11/17  7:19 PM  Result Value Ref Range   Lactic Acid, Venous 1.21 0.5 - 1.9 mmol/L  I-stat troponin, ED     Status: None   Collection Time: 08/11/17  7:21 PM  Result Value Ref Range   Troponin i, poc 0.02 0.00 - 0.08 ng/mL   Comment 3            Comment: Due to the release kinetics of cTnI, a negative result within the first hours of the onset of symptoms does not rule out myocardial infarction with certainty. If myocardial infarction is  still suspected, repeat the test at appropriate intervals.   Rapid urine drug screen (hospital performed)     Status: None   Collection Time: 08/11/17  9:13 PM  Result Value Ref Range   Opiates NONE DETECTED NONE DETECTED   Cocaine NONE DETECTED NONE DETECTED   Benzodiazepines NONE DETECTED NONE DETECTED   Amphetamines NONE DETECTED NONE DETECTED   Tetrahydrocannabinol NONE DETECTED NONE DETECTED   Barbiturates NONE DETECTED NONE DETECTED    Comment: (NOTE) DRUG SCREEN FOR MEDICAL PURPOSES ONLY.  IF CONFIRMATION IS NEEDED FOR ANY PURPOSE, NOTIFY LAB WITHIN 5 DAYS. LOWEST DETECTABLE LIMITS FOR URINE DRUG SCREEN Drug Class                     Cutoff (ng/mL) Amphetamine and metabolites    1000 Barbiturate and metabolites    200 Benzodiazepine                 280 Tricyclics and metabolites     300 Opiates and metabolites        300 Cocaine and metabolites        300 THC                            50 Performed at [facility], [place] [address], [place], [place] [address]   Urinalysis, Routine w reflex microscopic     Status: Abnormal   Collection Time: 08/11/17  9:36 PM  Result Value Ref Range   Color, Urine YELLOW YELLOW   APPearance CLEAR CLEAR   Specific Gravity, Urine 1.018 1.005 - 1.030   pH 5.0 5.0 - 8.0   Glucose, UA 150 (A) NEGATIVE mg/dL   Hgb urine dipstick SMALL (A) NEGATIVE   Bilirubin Urine NEGATIVE NEGATIVE   Ketones, ur NEGATIVE NEGATIVE mg/dL   Protein, ur >=300 (A) NEGATIVE mg/dL   Nitrite NEGATIVE NEGATIVE   Leukocytes, UA NEGATIVE NEGATIVE   RBC / HPF 0-5 0 - 5 RBC/hpf   WBC, UA 0-5 0 - 5 WBC/hpf   Bacteria, UA NONE SEEN NONE SEEN   Squamous Epithelial / LPF 0-5 0 - 5   Mucus PRESENT     Comment: Performed at  [facility], [place] [address] [place], [place] [address]  I-Stat Troponin, ED (not at [facility])     Status: None   Collection Time: 08/11/17 10:20 PM  Result Value Ref Range   Troponin i, poc 0.02 0.00 - 0.08 ng/mL   Comment 3             Comment: Due to the release kinetics of cTnI, a negative result within the first hours of the onset of symptoms does not rule out myocardial infarction with certainty. If myocardial infarction is still suspected, repeat the test at appropriate intervals.   I-Stat CG4 Lactic Acid, ED     Status: None   Collection Time: 08/11/17 10:22 PM  Result Value Ref Range   Lactic Acid, Venous 1.74 0.5 - 1.9 mmol/L  CBG monitoring, ED     Status: Abnormal   Collection Time: 08/12/17  1:37 AM  Result Value Ref Range   Glucose-Capillary 197 (H) 65 - 99 mg/dL   Comment 1 Notify RN    Comment 2 Document in Chart   C-reactive protein     Status: Abnormal   Collection Time: 08/12/17  4:49 AM  Result Value Ref Range   CRP 8.0 (H) <1.0 mg/dL    Comment: Performed at [facility], [place] [address], [place], [place] [address]  Basic metabolic panel     Status: Abnormal   Collection Time: 08/12/17  4:49 AM  Result Value Ref Range   Sodium 137 135 - 145 mmol/L   Potassium 3.5 3.5 - 5.1 mmol/L   Chloride 102 101 - 111 mmol/L   CO2 25 22 - 32 mmol/L   Glucose, Bld 198 (H) 65 - 99 mg/dL   BUN 15 6 - 20 mg/dL   Creatinine, Ser 1.64 (H) 0.61 - 1.24 mg/dL   Calcium 8.7 (L) 8.9 - 10.3 mg/dL   GFR calc non Af Amer 53 (L) >60 mL/min   GFR calc Af Amer >60 >60 mL/min    Comment: (NOTE) The eGFR has been calculated using the CKD EPI equation. This calculation has not been validated in all clinical situations. eGFR's persistently <60 mL/min signify possible Chronic Kidney Disease.    Anion gap 10 5 - 15    Comment: Performed at [facility] [address] [place] [address]  CBC     Status: None   Collection Time: 08/12/17  4:49 AM  Result Value Ref Range   WBC 8.5 4.0 - 10.5 K/uL   RBC 5.17 4.22 - 5.81 MIL/uL   Hemoglobin 13.6 13.0 - 17.0 g/dL   HCT 40.7 39.0 - 52.0 %   MCV 78.7 78.0 - 100.0 fL   MCH 26.3 26.0 - 34.0 pg   MCHC 33.4 30.0 - 36.0 g/dL   RDW 13.1 11.5 - 15.5 %    Platelets 238 150 - 400 K/uL    Comment: Performed at [facility], [place] [address], [place], [place] [address]  Hemoglobin A1c     Status: Abnormal   Collection Time: 08/12/17  4:49 AM  Result Value Ref Range   Hgb A1c MFr Bld 9.1 (H) 4.8 - 5.6 %    Comment: (NOTE) Pre diabetes:          5.7%-6.4% Diabetes:              >6.4% Glycemic control for   <7.0% adults with diabetes    Mean Plasma Glucose 214.47 mg/dL    Comment:  Performed at [facility], [place] [address], [place], [place] [address]  Glucose, capillary     Status: Abnormal   Collection Time: 08/12/17  6:41 AM  Result Value Ref Range   Glucose-Capillary 170 (H) 65 - 99 mg/dL  Glucose, capillary     Status: Abnormal   Collection Time: 08/12/17 12:02 PM  Result Value Ref Range   Glucose-Capillary 229 (H) 65 - 99 mg/dL   Comment 1 Notify RN    Comment 2 Document in Chart     Dg Chest 2 View  Result Date: 08/11/2017 CLINICAL DATA:  37 year old male with history of left-sided chest pain shortness of breath for the past 3 days. EXAM: CHEST - 2 VIEW COMPARISON:  No priors. FINDINGS: Lung volumes are increased. No consolidative airspace disease. No pleural effusions. No evidence of pulmonary edema. Heart size appears borderline to mildly enlarged. Upper mediastinal contours are within normal limits. IMPRESSION: 1. Hyperinflation of the lungs, which could be seen in setting of reactive airway disease. 2. Heart size is borderline enlarged mildly enlarged. Electronically Signed   By: Vinnie Langton M.D.   On: 08/11/2017 19:42   Ct Head Wo Contrast  Result Date: 08/11/2017 CLINICAL DATA:  Otitis externa.  Headache.  Generalized weakness. EXAM: CT TEMPORAL BONES WITH CONTRAST CT HEAD WITHOUT CONTRAST TECHNIQUE: Contiguous axial images were obtained from the base of the skull through the vertex without intravenous contrast and reconstructed in coronal and sagittal planes. Axial and coronal plane CT imaging of the petrous  temporal bones was performed with thin-collimation image reconstruction after intravenous contrast administration. Multiplanar CT image reconstructions were also generated. CONTRAST:  41m OMNIPAQUE IOHEXOL 300 MG/ML  SOLN COMPARISON:  Head CT 05/11/2013 FINDINGS: CT HEAD FINDINGS Brain: There is no mass, hemorrhage or extra-axial collection. The size and configuration of the ventricles and extra-axial CSF spaces are normal. There is no acute or chronic infarction. The brain parenchyma is normal. Vascular: No abnormal hyperdensity of the major intracranial arteries or dural venous sinuses. No intracranial atherosclerosis. Skull: The visualized skull base, calvarium and extracranial soft tissues are normal. Sinuses/Orbits: No fluid levels or advanced mucosal thickening of the visualized paranasal sinuses. The orbits are normal. CT TEMPORAL BONE FINDINGS RIGHT: --Pinna and external auditory canal: There is marked inflammatory change at the opening of the right external auditory canal extending into the adjacent subcutaneous fat. The external auditory canal is occluded. There is an external auditory canal exostosis that measures 10 x 9 mm, unchanged from 05/11/2013. --Ossicular chain: Normal. No erosion or dislocation. --Tympanic membrane: Not visible due to the amount of fluid/debris in the external auditory canal and middle ear. --Middle ear: Near complete opacification of the middle ear. --Epitympanum: There is filling of the epitympanum and Prussak space. Tegmen tympani remains intact. No scutum erosion. --Cochlea, vestibule, vestibular aqueduct and semicircular canals: Normal. No evidence of canal dehiscence or otospongiosis. --Internal auditory canal: Normal. No widening of the porus acusticus. --Facial nerve: The tympanic segment of the facial nerve is obscured by surrounding fluid and debris. Otherwise normal. --Cerebellopontine angle: Normal. --Petrous Apex: Normal. --Mastoids: Partial opacification of the  right mastoid air cells without coalescence. No infratemporal abscess. --Carotid canal: Normal position. LEFT: --Pinna and external auditory canal: There is mild inflammatory change of the left external auditory canal, which is nearly occluded. There is an external auditory canal exostosis measuring 9 mm. --Ossicular chain: Normal. No erosion or dislocation. --Tympanic membrane: Normal. --Middle ear: Normal. --Epitympanum: The Prussak space is clear. The  scutum is sharp. Tegmen tympani is intact. --Cochlea, vestibule, vestibular aqueduct and semicircular canals: Normal. No evidence of canal dehiscence or otospongiosis. --Internal auditory canal: Normal. No widening of the porus acusticus. --Facial nerve: No focal abnormality along the course of the facial nerve. --Cerebellopontine angle: Normal. --Petrous Apex: Normal. --Mastoids: Partial opacification of the left mastoid without coalescence. No infratemporal abscess. --Carotid canal: Normal position. OTHER: --Nasopharynx: Clear. --Temporomandibular joints: Normal. IMPRESSION: 1. Normal brain. 2. Right-sided otitis externa and otomastoiditis without mastoid coalescence or abscess. External auditory canal and middle ear are occluded with fluid/debris. 3. Left mastoid effusion without complication. Mild soft tissue thickening of the left external auditory canal is consistent with reported history of recent left-sided ear pain, but is mild compared to the right. Electronically Signed   By: Ulyses Jarred M.D.   On: 08/11/2017 23:18   Ct Temporal Bones W Contrast  Result Date: 08/11/2017 CLINICAL DATA:  Otitis externa.  Headache.  Generalized weakness. EXAM: CT TEMPORAL BONES WITH CONTRAST CT HEAD WITHOUT CONTRAST TECHNIQUE: Contiguous axial images were obtained from the base of the skull through the vertex without intravenous contrast and reconstructed in coronal and sagittal planes. Axial and coronal plane CT imaging of the petrous temporal bones was performed with  thin-collimation image reconstruction after intravenous contrast administration. Multiplanar CT image reconstructions were also generated. CONTRAST:  62m OMNIPAQUE IOHEXOL 300 MG/ML  SOLN COMPARISON:  Head CT 05/11/2013 FINDINGS: CT HEAD FINDINGS Brain: There is no mass, hemorrhage or extra-axial collection. The size and configuration of the ventricles and extra-axial CSF spaces are normal. There is no acute or chronic infarction. The brain parenchyma is normal. Vascular: No abnormal hyperdensity of the major intracranial arteries or dural venous sinuses. No intracranial atherosclerosis. Skull: The visualized skull base, calvarium and extracranial soft tissues are normal. Sinuses/Orbits: No fluid levels or advanced mucosal thickening of the visualized paranasal sinuses. The orbits are normal. CT TEMPORAL BONE FINDINGS RIGHT: --Pinna and external auditory canal: There is marked inflammatory change at the opening of the right external auditory canal extending into the adjacent subcutaneous fat. The external auditory canal is occluded. There is an external auditory canal exostosis that measures 10 x 9 mm, unchanged from 05/11/2013. --Ossicular chain: Normal. No erosion or dislocation. --Tympanic membrane: Not visible due to the amount of fluid/debris in the external auditory canal and middle ear. --Middle ear: Near complete opacification of the middle ear. --Epitympanum: There is filling of the epitympanum and Prussak space. Tegmen tympani remains intact. No scutum erosion. --Cochlea, vestibule, vestibular aqueduct and semicircular canals: Normal. No evidence of canal dehiscence or otospongiosis. --Internal auditory canal: Normal. No widening of the porus acusticus. --Facial nerve: The tympanic segment of the facial nerve is obscured by surrounding fluid and debris. Otherwise normal. --Cerebellopontine angle: Normal. --Petrous Apex: Normal. --Mastoids: Partial opacification of the right mastoid air cells without  coalescence. No infratemporal abscess. --Carotid canal: Normal position. LEFT: --Pinna and external auditory canal: There is mild inflammatory change of the left external auditory canal, which is nearly occluded. There is an external auditory canal exostosis measuring 9 mm. --Ossicular chain: Normal. No erosion or dislocation. --Tympanic membrane: Normal. --Middle ear: Normal. --Epitympanum: The Prussak space is clear. The scutum is sharp. Tegmen tympani is intact. --Cochlea, vestibule, vestibular aqueduct and semicircular canals: Normal. No evidence of canal dehiscence or otospongiosis. --Internal auditory canal: Normal. No widening of the porus acusticus. --Facial nerve: No focal abnormality along the course of the facial nerve. --Cerebellopontine angle: Normal. --Petrous Apex: Normal. --Mastoids: Partial  opacification of the left mastoid without coalescence. No infratemporal abscess. --Carotid canal: Normal position. OTHER: --Nasopharynx: Clear. --Temporomandibular joints: Normal. IMPRESSION: 1. Normal brain. 2. Right-sided otitis externa and otomastoiditis without mastoid coalescence or abscess. External auditory canal and middle ear are occluded with fluid/debris. 3. Left mastoid effusion without complication. Mild soft tissue thickening of the left external auditory canal is consistent with reported history of recent left-sided ear pain, but is mild compared to the right. Electronically Signed   By: Ulyses Jarred M.D.   On: 08/11/2017 23:18    Review of Systems  Constitutional: Negative.   HENT: Negative.   Eyes: Negative.   Skin: Negative.    Blood pressure (!) 185/110, pulse 81, temperature 98.4 F (36.9 C), temperature source Oral, resp. rate 20, weight 121.5 kg (267 lb 13.7 oz), SpO2 99 %. Physical Exam  Constitutional: He appears well-developed and well-nourished.  HENT:  Left ear with no exudate and the TM of what I can see is clear. Right ear swollen shut. A wick was placed and ciprodex  instilled. The pinna is slightly thickened. He has pain with movement of the pinna. VII nerve intact. 512 tuning is midline and BC>AC bilaterally   Eyes: Pupils are equal, round, and reactive to light. Conjunctivae are normal.  Neck: Normal range of motion. Neck supple.    Assessment/Plan: Otitis externa/media- he has right sided swelling that precluded the exam of the TM on the floor. A wick was placed. He has no toxic symptoms and the surrounding tissues are not excessively swollen. He should follow up in the office on Monday or Tuesday for wick removal.Come back sooner if any worse.  Send home on ciprofloxicin and ciprodex.   Melissa Montane 08/12/2017, 12:11 PM

## 2017-08-13 ENCOUNTER — Telehealth: Payer: Self-pay | Admitting: Internal Medicine

## 2017-08-13 DIAGNOSIS — R51 Headache: Secondary | ICD-10-CM

## 2017-08-13 DIAGNOSIS — H9203 Otalgia, bilateral: Secondary | ICD-10-CM

## 2017-08-13 DIAGNOSIS — I160 Hypertensive urgency: Secondary | ICD-10-CM

## 2017-08-13 DIAGNOSIS — E1159 Type 2 diabetes mellitus with other circulatory complications: Secondary | ICD-10-CM

## 2017-08-13 LAB — GLUCOSE, CAPILLARY
GLUCOSE-CAPILLARY: 165 mg/dL — AB (ref 65–99)
Glucose-Capillary: 209 mg/dL — ABNORMAL HIGH (ref 65–99)
Glucose-Capillary: 149 mg/dL — ABNORMAL HIGH (ref 65–99)

## 2017-08-13 LAB — BASIC METABOLIC PANEL
ANION GAP: 5 (ref 5–15)
BUN: 21 mg/dL — ABNORMAL HIGH (ref 6–20)
CALCIUM: 8.7 mg/dL — AB (ref 8.9–10.3)
CHLORIDE: 104 mmol/L (ref 101–111)
CO2: 26 mmol/L (ref 22–32)
Creatinine, Ser: 1.81 mg/dL — ABNORMAL HIGH (ref 0.61–1.24)
GFR calc non Af Amer: 47 mL/min — ABNORMAL LOW (ref >60)
GFR, EST AFRICAN AMERICAN: 55 mL/min — AB (ref >60)
Glucose, Bld: 217 mg/dL — ABNORMAL HIGH (ref 65–99)
POTASSIUM: 3.1 mmol/L — AB (ref 3.5–5.1)
Sodium: 135 mmol/L (ref 135–145)

## 2017-08-13 MED ORDER — MAGNESIUM SULFATE 2 GM/50ML IV SOLN
2.0000 g | Freq: Once | INTRAVENOUS | Status: AC
  Administered 2017-08-13: 2 g via INTRAVENOUS
  Filled 2017-08-13: qty 50

## 2017-08-13 MED ORDER — SPIRONOLACTONE 25 MG PO TABS: 25.0000 mg | ORAL_TABLET | Freq: Every day | ORAL | 0 refills | Status: DC

## 2017-08-13 MED ORDER — LISINOPRIL 40 MG PO TABS
40.0000 mg | ORAL_TABLET | Freq: Every day | ORAL | 0 refills | Status: DC
Start: 2017-08-14 — End: 2018-11-27

## 2017-08-13 MED ORDER — AMLODIPINE BESYLATE 10 MG PO TABS: 10.0000 mg | ORAL_TABLET | Freq: Every day | ORAL | 0 refills | Status: DC

## 2017-08-13 MED ORDER — CIPROFLOXACIN-DEXAMETHASONE 0.3-0.1 % OT SUSP: 4.0000 [drp] | Freq: Two times a day (BID) | OTIC | 0 refills | Status: DC

## 2017-08-13 MED ORDER — CIPROFLOXACIN HCL 500 MG PO TABS: 500.0000 mg | ORAL_TABLET | Freq: Two times a day (BID) | ORAL | 0 refills | Status: DC

## 2017-08-13 MED ORDER — METFORMIN HCL 1000 MG PO TABS: 1000.0000 mg | ORAL_TABLET | Freq: Two times a day (BID) | ORAL | 0 refills | Status: DC

## 2017-08-13 MED ORDER — HYDROCHLOROTHIAZIDE 25 MG PO TABS: 25.0000 mg | ORAL_TABLET | Freq: Every day | ORAL | 0 refills | Status: DC

## 2017-08-13 MED ORDER — CIPROFLOXACIN HCL 500 MG PO TABS: 500.0000 mg | ORAL_TABLET | Freq: Two times a day (BID) | ORAL | 0 refills | Status: AC

## 2017-08-13 MED ORDER — SPIRONOLACTONE 25 MG PO TABS
25.0000 mg | ORAL_TABLET | Freq: Every day | ORAL | Status: DC
  Administered 2017-08-13: 25 mg via ORAL
  Filled 2017-08-13: qty 1

## 2017-08-13 MED ORDER — POTASSIUM CHLORIDE CRYS ER 20 MEQ PO TBCR
40.0000 meq | EXTENDED_RELEASE_TABLET | Freq: Two times a day (BID) | ORAL | Status: DC
  Administered 2017-08-13: 40 meq via ORAL
  Filled 2017-08-13: qty 2

## 2017-08-13 NOTE — Progress Notes (Signed)
At 1800 Discharge instructions reviewed with pt and wife via phone. Copy of instructions given to pt, ear drops and cotton balls given to pt as pt has script at pharmacy for same ear drops and MD instructed for pt to take drops home and use as prescribed in d/c orders. Pt educated on ear drips and handout provided.  Pt meds at [facility], which has closed at 5:30 or 6:00pm. MD on call notified and script for po Cipro sent to pt's [facility], all other meds pt has at home, verified with wife over phone via video phone and nurse saw bottles with name of meds.  Pt will pick up script for cipro tonight.   .  Pt d/c'd via with belongings, pt declined wheelchair, wanted to walk  Escorted by unit NT.

## 2017-08-13 NOTE — Progress Notes (Addendum)
Subjective:  Harold Clements was seen lying comfortably in bed this morning. He reports improvement in his right-sided ear pain, headache, and swelling. He denies any fevers, chills, chest pain, photophobia, phonophobia, neck stiffness, or shortness of breath. He inquires about follow-up after discharge for help with HTN and T2DM management and is agreeable to [facility] follow-up next week.   Objective:  Vital signs in last 24 hours: Vitals:   08/12/17 0559 08/12/17 1827 08/12/17 2235 08/13/17 0020  BP: (!) 185/110 (!) 178/111 (!) 213/137 (!) 152/91  Pulse: 81 87 90   Resp: 20  18   Temp: 98.4 F (36.9 C) 98.6 F (37 C) 98.7 F (37.1 C)   TempSrc: Oral Oral Oral   SpO2: 99% 98% 99%   Weight:       Weight change:   Intake/Output Summary (Last 24 hours) at 08/13/2017 0654 Last data filed at 08/12/2017 1605 Gross per 24 hour  Intake 1716.67 ml  Output 1150 ml  Net 566.67 ml   Constitutional: He isoriented to person, place, and time.No acute distress.  HEENT:Right external auditory canal with wick catheter in place, TM not visualized. Left external auditory canal mildly edematous but improved from yesterday, TM not visualized. Mild tenderness to palpation over right mastoid and right pre-auricular area, improved from yesterday.  Cardiovascular:Normal rate,regular rhythmand intact distal pulses. No murmurs, rubs, or gallops. Respiratory:Effort normal. Norespiratory distress. LCTAB. He hasno wheezes. He hasno rales.  GI: Soft, non-distended. Normoactive bowel sounds. No tenderness to palpation. Neurological: PERRLA. EOM's intact. No facial nerve deficits. Moves all extremities spontaneously. Sensation to light touch intact bilaterally. Skin: Skin iswarmand dry.No rashnoted. No erythema.   Assessment/Plan:  Active Problems: Right otitis externa/media with mastoiditis HTN CKD, stage 3A T2DM  Harold Clements is a 37 year old male with PMH of recurrent otitis externa, uncontrolled  HTN, T2DM, and CKD stage 3A who presents with 3 days of worsening right-sided ear pain, drainage and swelling. Workup concerning for acute right sided otitis externa/media with mastoiditis as well as a resolving left otitis externa.   Right otitis externa/media with R mastoiditis- ENT placed a wick catheter yesterday and cleared for discharge from an infectious disease standpoint on PO ciprofloxacin and ciprodex drops. -Continue course of PO ciprofloxacin (500 mg BID for 10 total doses) -Continue ciprodex drops (4 drops BID in both ears for 25 total doses) -F/u to be scheduled with ENT for next week  HTN- BP>220/140 on admission. Home regimen includes lisinopril, HCTZ, amlodipine, and spironolactone but he reports that he often forgets to take them. SBPs ranging 150s-210s overnight and this morning. He remains asymptomatic. -Continue lisinopril, HCTZ, and amlodipine with SBP goal <180 -Added spironolactone given high BP's and low K  Chronic Kidney Disease, Stage 3A- Patient's Cr on admission elevated to 1.89, with eGFR ~50, and he has known history of CKD 3A likely secondary to chronic uncontrolled hypertension and diabetes.  -F/u outpatient   T2DM- HbA1c 9.1 this morning. Home regimen is metformin 1000 mg BID. Has required a total 11u SSI yesterday -CBG monitoring TID with Meals, qHS -SSITID with meals, qHS -F/u outpatient   FEN: Carb modified diet, replete lytes prn VTE ppx: Lovenox  Code Status: FULL   Dispo: Anticipate discharge this morning pending stable vitals and good outpatient f/u plan.    LOS: 1 day   Thornton Papas, Medical Student 08/13/2017, 6:54 AM Pager: [phone]  Attestation for Student Documentation:  I personally was present and performed or re-performed the history, physical  exam and medical decision-making activities of this service and have verified that the service and findings are accurately documented in the student's note.  Afebrile on PO meds;  will dc home with course of Cipro and Ciprodex per ENT recommendations. Patient following up in [facility] and we're working with our pharmacist to get affordable medications prior to leaving today.   Tayleigh Wetherell, DO 08/13/2017, 12:42 PM

## 2017-08-13 NOTE — Progress Notes (Signed)
Inpatient Diabetes Program Recommendations  AACE/ADA: New Consensus Statement on Inpatient Glycemic Control (2015)  Target Ranges:  Prepandial:   less than 140 mg/dL      Peak postprandial:   less than 180 mg/dL (1-2 hours)      Critically ill patients:  140 - 180 mg/dL   Lab Results  Component Value Date   GLUCAP 209 (H) 08/13/2017   HGBA1C 9.1 (H) 08/12/2017    Review of Glycemic ControlResults for ABDIKADIR, FOHL (MRN [id number]) as of 08/13/2017 10:14  Ref. Range 08/12/2017 06:41 08/12/2017 12:02 08/12/2017 16:14 08/12/2017 22:30 08/13/2017 06:19  Glucose-Capillary Latest Ref Range: 65 - 99 mg/dL 170 (H) 229 (H) 195 (H) 150 (H) 209 (H)    Diabetes history: Type 2 DM  Outpatient Diabetes medications:  Metformin 1000 mg bid Current orders for Inpatient glycemic control:  Novolog moderate tid with meals and HS  Inpatient Diabetes Program Recommendations:    While in the hospital, may consider adding Lantus 20 units daily.   Thanks, Adah Perl, RN, BC-ADM Inpatient Diabetes Coordinator Pager [phone] (8a-5p)

## 2017-08-13 NOTE — Discharge Summary (Addendum)
Name: Harold Clements MRN: [id number] DOB: 01-12-1981 37 y.o. PCP: Shela Leff, MD  Date of Admission: 08/11/2017  6:36 PM Date of Discharge: 08/13/17 Attending Physician: Aldine Contes, MD  Discharge Diagnosis: 1. Right otitis externa with mastoiditis 2. Uncontrolled HTN 3. T2DM  Discharge Medications: Allergies as of 08/13/2017      Reactions   Zithromax [azithromycin Dihydrate] Anaphylaxis      Medication List    TAKE these medications   amLODipine 10 MG tablet Commonly known as:  NORVASC Take 1 tablet (10 mg total) by mouth daily. Start taking on:  08/14/2017   ciprofloxacin 500 MG tablet Commonly known as:  CIPRO Take 1 tablet (500 mg total) by mouth 2 (two) times daily for 14 days.   ciprofloxacin-dexamethasone OTIC suspension Commonly known as:  CIPRODEX Place 4 drops into both ears 2 (two) times daily.   hydrochlorothiazide 25 MG tablet Commonly known as:  HYDRODIURIL Take 1 tablet (25 mg total) by mouth daily. Start taking on:  08/14/2017   lisinopril 40 MG tablet Commonly known as:  PRINIVIL,ZESTRIL Take 1 tablet (40 mg total) by mouth daily. IM program. Start taking on:  08/14/2017   metFORMIN 1000 MG tablet Commonly known as:  GLUCOPHAGE Take 1 tablet (1,000 mg total) by mouth 2 (two) times daily with a meal. IM program.   spironolactone 25 MG tablet Commonly known as:  ALDACTONE Take 1 tablet (25 mg total) by mouth daily. IM Program Start taking on:  08/14/2017       Disposition and follow-up:   [name] was discharged from [facility] in Stable condition.  At the hospital follow up visit please address:  1. Right otitis externa with mastoiditis- Swelling and pain had greatly improved and he was afebrile on day of discharge. Recommended continuing PO ciprofloxacin for a total of 14 days and ciprodex ear drops for a total of 25 doses to be completed outpatient. Ensure continued improvement, compliance with PO cipro  and Ciprodex ear drops. He does not have insurance but fortunately was able to take the Ciprodex bottle in the room. Please assess if he may need a refill. F/u with ENT scheduled for next week for removal of wick and further management - ensure follow-up.   2. Uncontrolled HTN- Blood pressures ranged 150s-160s/100s after all 4 home medications (lisinopril, HCTZ, amlodipine, and spironolactone) were resumed. We recommended outpatient follow-up for re-assessment and dose-adjustments as necessary. 90-day Rx sent to his Pharmacy. Assess for compliance and would benefit from BMET  3. T2DM- HbA1c was 9.1 on 08/12/17. Home regimen is metformin 1000 mg BID. We recommended outpatient follow-up for chronic diabetes management and better glycemic control. Would benefit from an additional PO agent.   4.  Labs / imaging needed at time of follow-up: BMET (Lisinopril, HCTZ and Spiro in pt with CKD)  5.  Pending labs/ test needing follow-up: N/A  Follow-up Appointments: Follow-up Information    [facility].   Contact information: [address],[place] [facility]: June 18th at 2:15 pm.  Hospital Course by problem list:  1.  Right otitis externa with mastoiditis: Presented with 3 days of progressive right ear pain, swelling, drainage, fever (to 100.4) and decreased hearing. CT temporal concerning for right-sided otitis externa with otomastoiditis, no evidence of brain involvement. He was originally started on Vancomycin/cefepime over concerns for a  complicated mastoid infection. He was evaluated by ENT on 08/13/17. A wick was placed in his right external auditory canal. He was then transitioned to PO ciprofloxacin and ciprodex ear drops. Recommended continuing PO ciprofloxacin for a total of 14 days and ciprodex ear drops for a total of 25 doses to be completed outpatient. F/u with ENT scheduled for  next week for removal of wick and further management.  2. Uncontrolled HTN: On admission BP was 220s/120s. He was not consistently taking his home regimen of lisinopril, HCTZ, amlodipine, and spironolactone. He received labetalol 10mg  x2 in the ED and was restarted on home lisinopril, HCTZ, and amlodipine. After restarting the 3 of his 4 home medications as mentioned, his SBP's remained high ranging 150s-210s so his home spironolactone was added back on as well. We discussed the importance of good blood pressure control in preventing [place]-term complications. He was discharged with instructions to take his home medications and follow-up in [facility] at his scheduled appointment 6/18.   3. T2DM: Patient reports taking Metformin 1000 mg BID at home. HbA1c was 9.1 on admission. His fasting blood glucose ranged from 150-226 while in the hospital. He was kept on sliding scale insulin with meals and qHS while in the hospital. We recommended outpatient follow-up for chronic management.   Discharge Vitals:   BP (!) 150/109 (BP Location: Right Arm)   Pulse 76   Temp 98.7 F (37.1 C) (Oral)   Resp 18   Wt 121.5 kg (267 lb 13.7 oz)   SpO2 99%   BMI 39.56 kg/m   Pertinent Labs, Studies, and Procedures:   CBC    Component Value Date/Time   WBC 8.5 08/12/2017 0449   RBC 5.17 08/12/2017 0449   HGB 13.6 08/12/2017 0449   HCT 40.7 08/12/2017 0449   PLT 238 08/12/2017 0449   MCV 78.7 08/12/2017 0449   MCH 26.3 08/12/2017 0449   MCHC 33.4 08/12/2017 0449   RDW 13.1 08/12/2017 0449   LYMPHSABS 1.5 08/11/2017 1911   MONOABS 0.5 08/11/2017 1911   EOSABS 0.0 08/11/2017 1911   BASOSABS 0.0 08/11/2017 1911   BMET    Component Value Date/Time   NA 135 08/13/2017 0531   NA 140 08/17/2016 1147   K 3.1 (L) 08/13/2017 0531   CL 104 08/13/2017 0531   CO2 26 08/13/2017 0531   GLUCOSE 217 (H) 08/13/2017 0531   BUN 21 (H) 08/13/2017 0531   BUN 23 (H) 08/17/2016 1147   CREATININE 1.81 (H) 08/13/2017 0531    CALCIUM 8.7 (L) 08/13/2017 0531   GFRNONAA 47 (L) 08/13/2017 0531   GFRAA 55 (L) 08/13/2017 0531   HIV antibody (08/12/17): Non-reactive  CT HEAD FINDINGS (08/11/17):  Brain: There is no mass, hemorrhage or extra-axial collection. The size and configuration of the ventricles and extra-axial CSF spaces are normal. There is no acute or chronic infarction. The brain parenchyma is normal.  Vascular: No abnormal hyperdensity of the major intracranial arteries or dural venous sinuses. No intracranial atherosclerosis.  Skull: The visualized skull base, calvarium and extracranial soft tissues are normal.  Sinuses/Orbits: No fluid levels or advanced mucosal thickening of the visualized paranasal sinuses. The orbits are normal.  CT TEMPORAL BONE FINDINGS (08/11/17): IMPRESSION: 1. Normal brain. 2. Right-sided otitis externa and otomastoiditis without mastoid coalescence or abscess. External auditory canal and middle ear are occluded with fluid/debris. 3. Left mastoid effusion without complication. Mild soft tissue thickening of the left external auditory canal is consistent with reported history of recent left-sided  ear pain, but is mild compared to the right.   Discharge Instructions:  Mr. Eben, Choinski came to the hospital with progressive right ear pain, swelling, and fevers. You were found to have an infection of your external ear canal which also involved the bone around your ear. We gave you antibiotics to help treat the infection. It will be important for you take take the full course of the oral Ciprofloxacin and Ciprodex ear drops in order to fully treat your infection. You also had high blood pressures (up to 241/154 when you first came in). We restarted you on your 4 home blood pressure medications (lisinopril, HCTZ, amlodipine, and spironolactone). It is important to take these in order to prevent complications down the road such as kidney disease, heart attack, or stroke. We  scheduled you follow-up appointments with the ENT doctor to remove the wick from your right ear, and with the primary care clinic to further manage your high blood pressures and diabetes.   Sincerely,  Gates Rigg   Signed: Thornton Papas, Medical Student 08/13/2017, 2:02 PM   Pager: [phone]   Attestation for Student Documentation:  I personally was present and performed or re-performed the history, physical exam and medical decision-making activities of this service and have verified that the service and findings are accurately documented in the student's note.  Ferrell Flam, DO 08/13/2017, 2:17 PM

## 2017-08-13 NOTE — Telephone Encounter (Signed)
Hospital f/u per Dr Danford Bad; patient appt 06/18 215pm/NW

## 2017-08-13 NOTE — Discharge Instructions (Signed)
**  YOUR MEDICATIONS WERE SENT TO THE [facility] (Significantly discounted for [facility] patients)**  Please take your ANTIBIOTICS and EAR DROPS as directed on the bottle. It is important that you complete the course exactly as prescribed as this infection has the potential to be very serious.   Please call [facility]NT for a follow-up appointment for Tuesday per Dr. Janace Hoard request. We are also working on getting you an appointment there and will call you if we get one.   I've scheduled you for a follow-up in [facility] Tuesday afternoon at 2:15pm. Please call to reschedule if you cannot make this appointment, but it is important that we see you soon after discharge to ensure you are doing well.   You were also discharged with several blood pressure medications. Please take these according to the directions on the bottle as well.

## 2017-08-17 ENCOUNTER — Other Ambulatory Visit: Payer: Self-pay

## 2017-08-17 ENCOUNTER — Ambulatory Visit (INDEPENDENT_AMBULATORY_CARE_PROVIDER_SITE_OTHER): Payer: Self-pay | Admitting: Internal Medicine

## 2017-08-17 VITALS — BP 162/119 | HR 82 | Temp 97.8°F | Ht 69.0 in | Wt 268.0 lb

## 2017-08-17 DIAGNOSIS — H7091 Unspecified mastoiditis, right ear: Secondary | ICD-10-CM

## 2017-08-17 DIAGNOSIS — Z7984 Long term (current) use of oral hypoglycemic drugs: Secondary | ICD-10-CM

## 2017-08-17 DIAGNOSIS — I10 Essential (primary) hypertension: Secondary | ICD-10-CM

## 2017-08-17 DIAGNOSIS — E1122 Type 2 diabetes mellitus with diabetic chronic kidney disease: Secondary | ICD-10-CM

## 2017-08-17 DIAGNOSIS — I129 Hypertensive chronic kidney disease with stage 1 through stage 4 chronic kidney disease, or unspecified chronic kidney disease: Secondary | ICD-10-CM

## 2017-08-17 DIAGNOSIS — H6091 Unspecified otitis externa, right ear: Secondary | ICD-10-CM

## 2017-08-17 DIAGNOSIS — E1159 Type 2 diabetes mellitus with other circulatory complications: Secondary | ICD-10-CM

## 2017-08-17 DIAGNOSIS — Z79899 Other long term (current) drug therapy: Secondary | ICD-10-CM

## 2017-08-17 DIAGNOSIS — E118 Type 2 diabetes mellitus with unspecified complications: Secondary | ICD-10-CM

## 2017-08-17 DIAGNOSIS — N183 Chronic kidney disease, stage 3 (moderate): Secondary | ICD-10-CM

## 2017-08-17 LAB — CULTURE, BLOOD (ROUTINE X 2)
CULTURE: NO GROWTH
Culture: NO GROWTH
SPECIAL REQUESTS: ADEQUATE
Special Requests: ADEQUATE

## 2017-08-17 MED ORDER — CANAGLIFLOZIN 100 MG PO TABS: 100.0000 mg | ORAL_TABLET | Freq: Every day | ORAL | 0 refills | Status: DC

## 2017-08-17 MED ORDER — SPIRONOLACTONE 50 MG PO TABS: 50.0000 mg | ORAL_TABLET | Freq: Every day | ORAL | 0 refills | Status: DC

## 2017-08-17 MED ORDER — ATORVASTATIN CALCIUM 40 MG PO TABS: 40.0000 mg | ORAL_TABLET | Freq: Every day | ORAL | 0 refills | Status: DC

## 2017-08-17 NOTE — Patient Instructions (Signed)
It was a pleasure to see you Harold Clements.  Please continue your medications as prescribed with the following changes: - Increase spironolactone to 50 mg daily - Start Canagliflozin 100 mg daily for diabetes - this may help with blood pressure as well - Start Atorvastatin 40 mg daily to reduce the risk for heart disease  Please continue your ear drops and antibiotics. Please follow up with ENT as well.  We are checking urine and blood work today. I will call you with the results.  Please follow up with su again in 4 weeks or sooner if needed.

## 2017-08-17 NOTE — Progress Notes (Signed)
CC: Right mastoiditis  HPI:  [name] is a 37 y.o. male with hypertension, type 2 diabetes, CKD stage II/III who presents for hospital follow-up evaluation after admission for right otitis externa with mastoiditis.  Please see problem based charting for status of patient's chronic medical issues.  Mastoiditis Patient recently admitted from 6/12-6/14/2019 for right-sided otitis externa with mastoiditis. He was initially treated with parenteral vancomycin and cefepime.  He was evaluated by ENT who placed a wick in his right external auditory canal.  He was then transitioned to oral ciprofloxacin 10 days and Ciprodex eardrops to which he reports adherence.  He says he has called to schedule an appointment with ENT for follow-up.  Since discharge from the hospital he feels much better.  He denies any further pain, fevers, chills, diaphoresis, discharge from the ear, pain with chewing, dysphagia, or change in hearing.  His only complaint is a feeling of some fullness in his right ear. A/P: Patient had recent admission for right otitis externa with mastoiditis which is now improving.  He will follow-up with ENT for wick removal and further evaluation.  He will continue his ciprofloxacin and Ciprodex eardrops as prescribed.  Hypertension associated with diabetes [name]) Patient with uncontrolled hypertension on multiple medications.  He is currently prescribed and reports adherence to amlodipine 10 mg daily, HCTZ 25 mg daily, lisinopril 40 mg daily, and spironolactone 25 mg daily.  His BP on arrival was 181/123.  He denies any headaches, change in vision, nausea, vomiting. BP Readings from Last 3 Encounters:  08/17/17 (!) 162/119  08/13/17 (!) 150/109  05/28/17 (!) 194/121   A/P: Patient with uncontrolled hypertension on multiple medications.  He has been noted to be frequently hypokalemic suggesting potential hyperaldosteronemia as the cause for his resistant hypertension.  He did have a normal  aldosterone/renin ratio on 05/21/2017, however this result may have been skewed depending on what medications he was taking.  Other secondary causes may include renal artery stenosis or obstructive sleep apnea.  I will increase his spironolactone and continue his other medications as prescribed.  We are also adding empagliflozin for his diabetes which may benefit his blood pressure as well. -Increase spironolactone to 50 mg daily -can increase further if needed and if tolerates -Continue lisinopril 40 mg daily, HCTZ 25 mg daily, amlodipine 10 mg daily -Repeat BMP shows stable renal function, K is 4.4 although this has been noted to be a hemolyzed sample -Follow-up in 4 weeks for reassessment and repeat BMP  Diabetes ([name]) His most recent A1c was 9.1 on 08/12/2017.  He reports adherence intolerance to metformin 1000 mg twice daily. A/P: Patient with uncontrolled diabetes.  We will start canagliflozin for further assistance in improving his glycemic control as well as potentially improving his blood pressure.  We will check a microalbumin/creatinine ratio to assess for nephrotic range proteinuria. -Start canagliflozin 100 mg daily -Continue metformin 1000 mg twice daily -Start high intensity atorvastatin 40 mg daily for diabetic patient -Repeat A1c in about 3 months    Past Medical History:  Diagnosis Date  . 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 Diabetes mellitus without complication ([place])   . Ear pain 07/2017  . Hypertension   . Wasp sting 08/04/2016   of dorsum of left hand near thumb   Review of Systems:   Review of Systems  Constitutional: Negative for chills, fever and malaise/fatigue.  HENT: Negative for ear discharge, ear pain and hearing loss.  No pain with chewing  Eyes:       No change in vision  Respiratory: Negative for cough and shortness of breath.   Cardiovascular: Negative for chest pain and leg swelling.  Gastrointestinal: Negative for  abdominal pain and diarrhea.  Neurological: Negative for dizziness, loss of consciousness and headaches.     Physical Exam:  Vitals:   08/17/17 1428 08/17/17 1520  BP: (!) 181/123 (!) 162/119  Pulse: 91 82  Temp: 97.8 F (36.6 C)   TempSrc: Oral   SpO2: 99%   Weight: 268 lb (121.6 kg)   Height: 5\' 9"  (1.753 m)    Physical Exam  Constitutional: He is oriented to person, place, and time. He appears well-developed and well-nourished. No distress.  HENT:  Head: Normocephalic and atraumatic.  Mouth/Throat: Oropharynx is clear and moist.  Bilateral ear canals with dry flaky skin and dry cerumen.  Full view of tympanic membranes is obscured but no obvious effusion or perforation.  No discharge from the ears or tenderness to palpation of the TMJ bilaterally.  Cardiovascular: Normal rate and regular rhythm.  No murmur heard. Pulmonary/Chest: Effort normal. No respiratory distress. He has no wheezes. He has no rales.  Neurological: He is alert and oriented to person, place, and time.  Skin: He is not diaphoretic.  Psychiatric: He has a normal mood and affect.     Assessment & Plan:   See Encounters Tab for problem based charting.  Patient discussed with Dr. Lynnae January

## 2017-08-18 LAB — BMP8+ANION GAP
Anion Gap: 17 mmol/L (ref 10.0–18.0)
BUN/Creatinine Ratio: 16 (ref 9–20)
BUN: 27 mg/dL — AB (ref 6–20)
CO2: 21 mmol/L (ref 20–29)
Calcium: 9.7 mg/dL (ref 8.7–10.2)
Chloride: 100 mmol/L (ref 96–106)
Creatinine, Ser: 1.74 mg/dL — ABNORMAL HIGH (ref 0.76–1.27)
GFR, EST AFRICAN AMERICAN: 58 mL/min/{1.73_m2} — AB (ref >59)
GFR, EST NON AFRICAN AMERICAN: 50 mL/min/{1.73_m2} — AB (ref >59)
Glucose: 135 mg/dL — ABNORMAL HIGH (ref 65–99)
Potassium: 4.4 mmol/L (ref 3.5–5.2)
SODIUM: 138 mmol/L (ref 134–144)

## 2017-08-18 LAB — MICROALBUMIN / CREATININE URINE RATIO
CREATININE, UR: 254.9 mg/dL
Microalb/Creat Ratio: 330.2 mg/g creat — ABNORMAL HIGH (ref 0.0–30.0)
Microalbumin, Urine: 841.6 ug/mL

## 2017-08-18 NOTE — Assessment & Plan Note (Signed)
Patient recently admitted from 6/12-6/14/2019 for right-sided otitis externa with mastoiditis. He was initially treated with parenteral vancomycin and cefepime.  He was evaluated by ENT who placed a wick in his right external auditory canal.  He was then transitioned to oral ciprofloxacin 10 days and Ciprodex eardrops to which he reports adherence.  He says he has called to schedule an appointment with ENT for follow-up.  Since discharge from the hospital he feels much better.  He denies any further pain, fevers, chills, diaphoresis, discharge from the ear, pain with chewing, dysphagia, or change in hearing.  His only complaint is a feeling of some fullness in his right ear. A/P: Patient had recent admission for right otitis externa with mastoiditis which is now improving.  He will follow-up with ENT for wick removal and further evaluation.  He will continue his ciprofloxacin and Ciprodex eardrops as prescribed.

## 2017-08-18 NOTE — Assessment & Plan Note (Signed)
His most recent A1c was 9.1 on 08/12/2017.  He reports adherence intolerance to metformin 1000 mg twice daily. A/P: Patient with uncontrolled diabetes.  We will start canagliflozin for further assistance in improving his glycemic control as well as potentially improving his blood pressure.  We will check a microalbumin/creatinine ratio to assess for nephrotic range proteinuria. -Start canagliflozin 100 mg daily -Continue metformin 1000 mg twice daily -Start high intensity atorvastatin 40 mg daily for diabetic patient -Repeat A1c in about 3 months

## 2017-08-18 NOTE — Assessment & Plan Note (Addendum)
Patient with uncontrolled hypertension on multiple medications.  He is currently prescribed and reports adherence to amlodipine 10 mg daily, HCTZ 25 mg daily, lisinopril 40 mg daily, and spironolactone 25 mg daily.  His BP on arrival was 181/123.  He denies any headaches, change in vision, nausea, vomiting. BP Readings from Last 3 Encounters:  08/17/17 (!) 162/119  08/13/17 (!) 150/109  05/28/17 (!) 194/121   A/P: Patient with uncontrolled hypertension on multiple medications.  He has been noted to be frequently hypokalemic suggesting potential hyperaldosteronemia as the cause for his resistant hypertension.  He did have a normal aldosterone/renin ratio on 05/21/2017, however this result may have been skewed depending on what medications he was taking.  Other secondary causes may include renal artery stenosis or obstructive sleep apnea.  I will increase his spironolactone and continue his other medications as prescribed.  We are also adding empagliflozin for his diabetes which may benefit his blood pressure as well. -Increase spironolactone to 50 mg daily -can increase further if needed and if tolerates -Continue lisinopril 40 mg daily, HCTZ 25 mg daily, amlodipine 10 mg daily -Repeat BMP shows stable renal function, K is 4.4 although this has been noted to be a hemolyzed sample -Follow-up in 4 weeks for reassessment and repeat BMP

## 2017-08-18 NOTE — Progress Notes (Signed)
Internal Medicine Clinic Attending  Case discussed with Dr. Patel at the time of the visit.  We reviewed the resident's history and exam and pertinent patient test results.  I agree with the assessment, diagnosis, and plan of care documented in the resident's note.  

## 2017-08-18 NOTE — Telephone Encounter (Signed)
Completed HFU 08/17/2017. Hubbard Hartshorn, RN, BSN

## 2017-08-25 ENCOUNTER — Encounter: Payer: Self-pay | Admitting: *Deleted

## 2017-09-17 ENCOUNTER — Ambulatory Visit: Payer: Self-pay

## 2017-09-17 ENCOUNTER — Encounter: Payer: Self-pay | Admitting: Internal Medicine

## 2018-01-14 ENCOUNTER — Encounter: Payer: Self-pay | Admitting: Internal Medicine

## 2018-01-14 ENCOUNTER — Ambulatory Visit: Payer: Self-pay

## 2018-01-19 ENCOUNTER — Encounter: Payer: Self-pay | Admitting: Internal Medicine

## 2018-03-29 ENCOUNTER — Emergency Department (HOSPITAL_COMMUNITY): Payer: Medicaid Other

## 2018-03-29 ENCOUNTER — Emergency Department (HOSPITAL_COMMUNITY)
Admission: EM | Admit: 2018-03-29 | Discharge: 2018-03-29 | Disposition: A | Discharge status: Home / Self Care | Payer: Medicaid Other | Attending: Emergency Medicine | Admitting: Emergency Medicine

## 2018-03-29 ENCOUNTER — Encounter (HOSPITAL_COMMUNITY): Payer: Self-pay

## 2018-03-29 DIAGNOSIS — Z79899 Other long term (current) drug therapy: Secondary | ICD-10-CM | POA: Insufficient documentation

## 2018-03-29 DIAGNOSIS — I129 Hypertensive chronic kidney disease with stage 1 through stage 4 chronic kidney disease, or unspecified chronic kidney disease: Secondary | ICD-10-CM | POA: Insufficient documentation

## 2018-03-29 DIAGNOSIS — N182 Chronic kidney disease, stage 2 (mild): Secondary | ICD-10-CM | POA: Insufficient documentation

## 2018-03-29 DIAGNOSIS — J111 Influenza due to unidentified influenza virus with other respiratory manifestations: Secondary | ICD-10-CM | POA: Insufficient documentation

## 2018-03-29 DIAGNOSIS — R69 Illness, unspecified: Secondary | ICD-10-CM

## 2018-03-29 DIAGNOSIS — E1122 Type 2 diabetes mellitus with diabetic chronic kidney disease: Secondary | ICD-10-CM | POA: Insufficient documentation

## 2018-03-29 DIAGNOSIS — Z87891 Personal history of nicotine dependence: Secondary | ICD-10-CM | POA: Insufficient documentation

## 2018-03-29 DIAGNOSIS — Z7984 Long term (current) use of oral hypoglycemic drugs: Secondary | ICD-10-CM | POA: Insufficient documentation

## 2018-03-29 DIAGNOSIS — E876 Hypokalemia: Secondary | ICD-10-CM | POA: Insufficient documentation

## 2018-03-29 LAB — BASIC METABOLIC PANEL
Anion gap: 13 (ref 5–15)
BUN: 14 mg/dL (ref 6–20)
CO2: 23 mmol/L (ref 22–32)
Calcium: 9.3 mg/dL (ref 8.9–10.3)
Chloride: 102 mmol/L (ref 98–111)
Creatinine, Ser: 1.71 mg/dL — ABNORMAL HIGH (ref 0.61–1.24)
GFR calc Af Amer: 59 mL/min — ABNORMAL LOW (ref >60)
GFR calc non Af Amer: 51 mL/min — ABNORMAL LOW (ref >60)
Glucose, Bld: 156 mg/dL — ABNORMAL HIGH (ref 70–99)
Potassium: 3.4 mmol/L — ABNORMAL LOW (ref 3.5–5.1)
Sodium: 138 mmol/L (ref 135–145)

## 2018-03-29 LAB — CBC WITH DIFFERENTIAL/PLATELET
Abs Immature Granulocytes: 0.02 10*3/uL (ref 0.00–0.07)
Basophils Absolute: 0 10*3/uL (ref 0.0–0.1)
Basophils Relative: 1 %
EOS ABS: 0.1 10*3/uL (ref 0.0–0.5)
Eosinophils Relative: 1 %
HCT: 41.4 % (ref 39.0–52.0)
Hemoglobin: 13.8 g/dL (ref 13.0–17.0)
IMMATURE GRANULOCYTES: 0 %
Lymphocytes Relative: 21 %
Lymphs Abs: 1.8 10*3/uL (ref 0.7–4.0)
MCH: 26.5 pg (ref 26.0–34.0)
MCHC: 33.3 g/dL (ref 30.0–36.0)
MCV: 79.5 fL — ABNORMAL LOW (ref 80.0–100.0)
Monocytes Absolute: 0.5 10*3/uL (ref 0.1–1.0)
Monocytes Relative: 6 %
NEUTROS PCT: 71 %
Neutro Abs: 5.9 10*3/uL (ref 1.7–7.7)
Platelets: 267 10*3/uL (ref 150–400)
RBC: 5.21 MIL/uL (ref 4.22–5.81)
RDW: 13 % (ref 11.5–15.5)
WBC: 8.4 10*3/uL (ref 4.0–10.5)
nRBC: 0 % (ref 0.0–0.2)

## 2018-03-29 LAB — GROUP A STREP BY PCR: Group A Strep by PCR: NOT DETECTED

## 2018-03-29 MED ORDER — HYDROCHLOROTHIAZIDE 25 MG PO TABS
25.0000 mg | ORAL_TABLET | Freq: Every day | ORAL | Status: DC
  Administered 2018-03-29: 25 mg via ORAL
  Filled 2018-03-29: qty 1

## 2018-03-29 MED ORDER — LISINOPRIL 20 MG PO TABS
40.0000 mg | ORAL_TABLET | Freq: Once | ORAL | Status: AC
  Administered 2018-03-29: 40 mg via ORAL
  Filled 2018-03-29: qty 2

## 2018-03-29 MED ORDER — AMLODIPINE BESYLATE 5 MG PO TABS
10.0000 mg | ORAL_TABLET | Freq: Once | ORAL | Status: AC
  Administered 2018-03-29: 10 mg via ORAL
  Filled 2018-03-29: qty 2

## 2018-03-29 MED ORDER — POTASSIUM CHLORIDE CRYS ER 20 MEQ PO TBCR
40.0000 meq | EXTENDED_RELEASE_TABLET | Freq: Once | ORAL | Status: AC
  Administered 2018-03-29: 40 meq via ORAL
  Filled 2018-03-29: qty 2

## 2018-03-29 NOTE — ED Notes (Signed)
ED Provider at bedside. 

## 2018-03-29 NOTE — ED Provider Notes (Addendum)
[facility] EMERGENCY DEPARTMENT Provider Note   CSN: [id number] Arrival date & time: 03/29/18  9675     History   Chief Complaint Chief Complaint  Patient presents with  . Sore Throat  . Shortness of Breath    HPI Harold Clements is a 38 y.o. male.  Complains of cough sneeze sore throat shortness of breath nasal congestion onset 3 days ago.  Symptoms accompanied by subjective fever.  No myalgias.  No headache.  No treatment prior to coming here.  No other associated symptoms nothing makes symptoms better or worse.  Patient admits to noncompliance with blood pressure and diabetic medications for the past 2 days.  HPI  Past Medical History:  Diagnosis Date  . 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 Diabetes mellitus without complication ([place])   . Ear pain 07/2017  . Hypertension   . Wasp sting 08/04/2016   of dorsum of left hand near thumb    Patient Active Problem List   Diagnosis Date Noted  . Mastoiditis 08/12/2017  . CKD (chronic kidney disease) stage 2, GFR 60-89 ml/min 08/17/2016  . Hypertension associated with diabetes ([place]) 08/06/2016  . Diabetes ([place]) 08/06/2016    History reviewed. No pertinent surgical history.      Home Medications    Prior to Admission medications   Medication Sig Start Date End Date Taking? Authorizing Provider  amLODipine (NORVASC) 10 MG tablet Take 1 tablet (10 mg total) by mouth daily. 08/14/17   Molt, Bethany, DO  atorvastatin (LIPITOR) 40 MG tablet Take 1 tablet (40 mg total) by mouth daily at 6 PM. 08/17/17   Lenore Cordia, MD  canagliflozin [facility]) 100 MG TABS tablet Take 1 tablet (100 mg total) by mouth daily before breakfast. 08/17/17   Lenore Cordia, MD  ciprofloxacin-dexamethasone (CIPRODEX) OTIC suspension Place 4 drops into both ears 2 (two) times daily. 08/13/17   Molt, Bethany, DO  hydrochlorothiazide (HYDRODIURIL) 25 MG tablet Take 1 tablet (25 mg total) by mouth daily. 08/14/17    Molt, Bethany, DO  lisinopril (PRINIVIL,ZESTRIL) 40 MG tablet Take 1 tablet (40 mg total) by mouth daily. IM program. 08/14/17   Molt, Romelle Starcher, DO  metFORMIN (GLUCOPHAGE) 1000 MG tablet Take 1 tablet (1,000 mg total) by mouth 2 (two) times daily with a meal. IM program. 08/13/17 09/12/17  Molt, Romelle Starcher, DO  spironolactone (ALDACTONE) 50 MG tablet Take 1 tablet (50 mg total) by mouth daily. IM Program 08/17/17 11/15/17  Lenore Cordia, MD    Family History Family History  Problem Relation Age of Onset  . Hypertension Mother   . Diabetes Mother   . Heart disease Mother   . Hypertension Father     Social History Social History   Tobacco Use  . Smoking status: Former Smoker    Years: 4.00    Types: Cigars    Last attempt to quit: 2015    Years since quitting: 5.0  . Smokeless tobacco: Never Used  Substance Use Topics  . Alcohol use: No  . Drug use: No     Allergies   Zithromax [azithromycin dihydrate]   Review of Systems Review of Systems  Constitutional: Negative.   HENT: Positive for congestion, sneezing and sore throat.   Respiratory: Positive for cough and shortness of breath.   Cardiovascular: Negative.   Gastrointestinal: Negative.   Musculoskeletal: Negative.   Skin: Negative.   Allergic/Immunologic: Positive for immunocompromised state.  Diabetic  Neurological: Negative.   Psychiatric/Behavioral: Negative.   All other systems reviewed and are negative.    Physical Exam Updated Vital Signs BP (!) 198/140 (BP Location: Right Arm)   Pulse 94   Temp 98.6 F (37 C) (Oral)   Resp 18   SpO2 99%   Physical Exam Vitals signs and nursing note reviewed.  Constitutional:      Appearance: He is well-developed. He is not ill-appearing.  HENT:     Head: Normocephalic and atraumatic.     Comments: Oropharynx reddened.  No exudate uvula midline Eyes:     Conjunctiva/sclera: Conjunctivae normal.     Pupils: Pupils are equal, round, and reactive to light.    Neck:     Musculoskeletal: Neck supple.     Thyroid: No thyromegaly.     Trachea: No tracheal deviation.  Cardiovascular:     Rate and Rhythm: Normal rate and regular rhythm.     Heart sounds: No murmur.  Pulmonary:     Effort: Pulmonary effort is normal.     Breath sounds: Normal breath sounds.  Abdominal:     General: Bowel sounds are normal. There is no distension.     Palpations: Abdomen is soft.     Tenderness: There is no abdominal tenderness.  Musculoskeletal: Normal range of motion.        General: No tenderness.  Skin:    General: Skin is warm and dry.     Findings: No rash.  Neurological:     Mental Status: He is alert.     Coordination: Coordination normal.  Psychiatric:        Mood and Affect: Mood normal.      ED Treatments / Results  Labs (all labs ordered are listed, but only abnormal results are displayed) Labs Reviewed  GROUP A STREP BY PCR  BASIC METABOLIC PANEL  CBC WITH DIFFERENTIAL/PLATELET    EKG EKG Interpretation  Date/Time:  Tuesday March 29 2018 07:40:05 EST Ventricular Rate:  96 PR Interval:  172 QRS Duration: 94 QT Interval:  352 QTC Calculation: 444 R Axis:   27 Text Interpretation:  Normal sinus rhythm Biatrial enlargement Abnormal ECG No significant change since last tracing Confirmed by Orlie Dakin [phone]) on 03/29/2018 11:16:34 AM   Radiology Dg Chest 2 View  Result Date: 03/29/2018 CLINICAL DATA:  Shortness of breath for 1 month EXAM: CHEST - 2 VIEW COMPARISON:  08/11/2017 FINDINGS: Cardiac shadow is mildly enlarged but stable. Lungs are well aerated bilaterally. No focal infiltrate or sizable effusion is noted. Mild degenerative changes of the thoracic spine are noted. IMPRESSION: No active cardiopulmonary disease. Electronically Signed   By: Inez Catalina M.D.   On: 03/29/2018 08:17   Chest x-ray viewed by me Procedures Procedures (including critical care time)  Medications Ordered in ED Medications   hydrochlorothiazide (HYDRODIURIL) tablet 25 mg (25 mg Oral Given 03/29/18 1052)  amLODipine (NORVASC) tablet 10 mg (10 mg Oral Given 03/29/18 1049)  lisinopril (PRINIVIL,ZESTRIL) tablet 40 mg (40 mg Oral Given 03/29/18 1053)     Results for orders placed or performed during the hospital encounter of 03/29/18  Group A Strep by PCR  Result Value Ref Range   Group A Strep by PCR NOT DETECTED NOT DETECTED  Basic metabolic panel  Result Value Ref Range   Sodium 138 135 - 145 mmol/L   Potassium 3.4 (L) 3.5 - 5.1 mmol/L   Chloride 102 98 - 111 mmol/L   CO2 23 22 - 32  mmol/L   Glucose, Bld 156 (H) 70 - 99 mg/dL   BUN 14 6 - 20 mg/dL   Creatinine, Ser 1.71 (H) 0.61 - 1.24 mg/dL   Calcium 9.3 8.9 - 10.3 mg/dL   GFR calc non Af Amer 51 (L) >60 mL/min   GFR calc Af Amer 59 (L) >60 mL/min   Anion gap 13 5 - 15  CBC with Differential/Platelet  Result Value Ref Range   WBC 8.4 4.0 - 10.5 K/uL   RBC 5.21 4.22 - 5.81 MIL/uL   Hemoglobin 13.8 13.0 - 17.0 g/dL   HCT 41.4 39.0 - 52.0 %   MCV 79.5 (L) 80.0 - 100.0 fL   MCH 26.5 26.0 - 34.0 pg   MCHC 33.3 30.0 - 36.0 g/dL   RDW 13.0 11.5 - 15.5 %   Platelets 267 150 - 400 K/uL   nRBC 0.0 0.0 - 0.2 %   Neutrophils Relative % 71 %   Neutro Abs 5.9 1.7 - 7.7 K/uL   Lymphocytes Relative 21 %   Lymphs Abs 1.8 0.7 - 4.0 K/uL   Monocytes Relative 6 %   Monocytes Absolute 0.5 0.1 - 1.0 K/uL   Eosinophils Relative 1 %   Eosinophils Absolute 0.1 0.0 - 0.5 K/uL   Basophils Relative 1 %   Basophils Absolute 0.0 0.0 - 0.1 K/uL   Immature Granulocytes 0 %   Abs Immature Granulocytes 0.02 0.00 - 0.07 K/uL   Dg Chest 2 View  Result Date: 03/29/2018 CLINICAL DATA:  Shortness of breath for 1 month EXAM: CHEST - 2 VIEW COMPARISON:  08/11/2017 FINDINGS: Cardiac shadow is mildly enlarged but stable. Lungs are well aerated bilaterally. No focal infiltrate or sizable effusion is noted. Mild degenerative changes of the thoracic spine are noted. IMPRESSION: No  active cardiopulmonary disease. Electronically Signed   By: Inez Catalina M.D.   On: 03/29/2018 08:17    Initial Impression / Assessment and Plan / ED Course  I have reviewed the triage vital signs and the nursing notes.  Pertinent labs & imaging results that were available during my care of the patient were reviewed by me and considered in my medical decision making (see chart for details).   Lab work remarkable for mild hypokalemia and renal insufficiency which is chronic.  He exhibits no endorgan damage  Patient with influenza-like illness.  He has no physical signs or symptoms from elevated blood pressure.  Patient reports he has adequate blood pressure medications at home.  He will be referred to primary care.  Suggest blood pressure recheck 1 week. Final Clinical Impressions(s) / ED Diagnoses  Diagnosis #1 influenza-like illness #2 elevated blood pressure #3 hypokalemia Final diagnoses:  None   #5 medication noncompliance ED Discharge Orders    None       Orlie Dakin, MD 03/29/18 1412    Orlie Dakin, MD 03/29/18 1413    Orlie Dakin, MD 03/29/18 1420

## 2018-03-29 NOTE — Discharge Instructions (Addendum)
Call the number on these instructions to get a primary care physician.  Your blood pressure should be rechecked in a week.  If you are unable to see a primary care physician in a week, go to an urgent care center to get your blood pressure rechecked.   Today's was elevated at 210/140.  Please take your blood pressure medications and all medications as prescribed.

## 2018-03-29 NOTE — ED Notes (Signed)
Patient verbalizes understanding of discharge instructions. Opportunity for questioning and answers were provided. Armband removed by staff, pt discharged from ED.  

## 2018-03-29 NOTE — ED Triage Notes (Addendum)
Pt endorses sore throat since last night. Pt has white patches and redness, airway intact. Pt also endorses dyspnea with exertion "for a while" Pt is hypertensive 209/147, did not take bp meds this morning.

## 2018-04-04 ENCOUNTER — Ambulatory Visit (INDEPENDENT_AMBULATORY_CARE_PROVIDER_SITE_OTHER): Payer: Medicaid Other | Admitting: Otolaryngology

## 2018-07-31 DIAGNOSIS — Z9114 Patient's other noncompliance with medication regimen: Secondary | ICD-10-CM | POA: Insufficient documentation

## 2018-07-31 DIAGNOSIS — G4733 Obstructive sleep apnea (adult) (pediatric): Secondary | ICD-10-CM | POA: Insufficient documentation

## 2018-07-31 DIAGNOSIS — E785 Hyperlipidemia, unspecified: Secondary | ICD-10-CM | POA: Insufficient documentation

## 2018-11-27 ENCOUNTER — Other Ambulatory Visit: Payer: Self-pay

## 2018-11-27 ENCOUNTER — Inpatient Hospital Stay (HOSPITAL_COMMUNITY)
Admission: EM | Admit: 2018-11-27 | Discharge: 2018-11-30 | DRG: 291 | Disposition: A | Discharge status: Home / Self Care | Payer: Medicaid Other | Attending: Internal Medicine | Admitting: Internal Medicine

## 2018-11-27 ENCOUNTER — Observation Stay (HOSPITAL_BASED_OUTPATIENT_CLINIC_OR_DEPARTMENT_OTHER): Payer: Medicaid Other

## 2018-11-27 ENCOUNTER — Encounter (HOSPITAL_COMMUNITY): Payer: Self-pay | Admitting: Internal Medicine

## 2018-11-27 ENCOUNTER — Emergency Department (HOSPITAL_COMMUNITY): Payer: Medicaid Other

## 2018-11-27 DIAGNOSIS — I5021 Acute systolic (congestive) heart failure: Secondary | ICD-10-CM

## 2018-11-27 DIAGNOSIS — Z7984 Long term (current) use of oral hypoglycemic drugs: Secondary | ICD-10-CM

## 2018-11-27 DIAGNOSIS — G4733 Obstructive sleep apnea (adult) (pediatric): Secondary | ICD-10-CM | POA: Diagnosis present

## 2018-11-27 DIAGNOSIS — N179 Acute kidney failure, unspecified: Secondary | ICD-10-CM | POA: Diagnosis present

## 2018-11-27 DIAGNOSIS — Z6837 Body mass index (BMI) 37.0-37.9, adult: Secondary | ICD-10-CM

## 2018-11-27 DIAGNOSIS — Z20828 Contact with and (suspected) exposure to other viral communicable diseases: Secondary | ICD-10-CM | POA: Diagnosis not present

## 2018-11-27 DIAGNOSIS — I5043 Acute on chronic combined systolic (congestive) and diastolic (congestive) heart failure: Secondary | ICD-10-CM | POA: Diagnosis present

## 2018-11-27 DIAGNOSIS — I169 Hypertensive crisis, unspecified: Secondary | ICD-10-CM | POA: Diagnosis present

## 2018-11-27 DIAGNOSIS — R0602 Shortness of breath: Secondary | ICD-10-CM | POA: Diagnosis not present

## 2018-11-27 DIAGNOSIS — E118 Type 2 diabetes mellitus with unspecified complications: Secondary | ICD-10-CM

## 2018-11-27 DIAGNOSIS — Z8249 Family history of ischemic heart disease and other diseases of the circulatory system: Secondary | ICD-10-CM

## 2018-11-27 DIAGNOSIS — R000 Tachycardia, unspecified: Secondary | ICD-10-CM | POA: Diagnosis not present

## 2018-11-27 DIAGNOSIS — R9431 Abnormal electrocardiogram [ECG] [EKG]: Secondary | ICD-10-CM | POA: Diagnosis not present

## 2018-11-27 DIAGNOSIS — I160 Hypertensive urgency: Secondary | ICD-10-CM | POA: Diagnosis not present

## 2018-11-27 DIAGNOSIS — I10 Essential (primary) hypertension: Secondary | ICD-10-CM | POA: Diagnosis not present

## 2018-11-27 DIAGNOSIS — Z9119 Patient's noncompliance with other medical treatment and regimen: Secondary | ICD-10-CM

## 2018-11-27 DIAGNOSIS — E1122 Type 2 diabetes mellitus with diabetic chronic kidney disease: Secondary | ICD-10-CM | POA: Diagnosis present

## 2018-11-27 DIAGNOSIS — N184 Chronic kidney disease, stage 4 (severe): Secondary | ICD-10-CM | POA: Diagnosis present

## 2018-11-27 DIAGNOSIS — E1159 Type 2 diabetes mellitus with other circulatory complications: Secondary | ICD-10-CM

## 2018-11-27 DIAGNOSIS — R0902 Hypoxemia: Secondary | ICD-10-CM | POA: Diagnosis not present

## 2018-11-27 DIAGNOSIS — I130 Hypertensive heart and chronic kidney disease with heart failure and stage 1 through stage 4 chronic kidney disease, or unspecified chronic kidney disease: Principal | ICD-10-CM | POA: Diagnosis present

## 2018-11-27 DIAGNOSIS — Z841 Family history of disorders of kidney and ureter: Secondary | ICD-10-CM

## 2018-11-27 DIAGNOSIS — Z833 Family history of diabetes mellitus: Secondary | ICD-10-CM

## 2018-11-27 DIAGNOSIS — N182 Chronic kidney disease, stage 2 (mild): Secondary | ICD-10-CM | POA: Diagnosis present

## 2018-11-27 DIAGNOSIS — I161 Hypertensive emergency: Secondary | ICD-10-CM | POA: Diagnosis present

## 2018-11-27 DIAGNOSIS — E119 Type 2 diabetes mellitus without complications: Secondary | ICD-10-CM

## 2018-11-27 DIAGNOSIS — E1165 Type 2 diabetes mellitus with hyperglycemia: Secondary | ICD-10-CM

## 2018-11-27 LAB — BASIC METABOLIC PANEL
Anion gap: 12 (ref 5–15)
BUN: 23 mg/dL — ABNORMAL HIGH (ref 6–20)
CO2: 21 mmol/L — ABNORMAL LOW (ref 22–32)
Calcium: 9.3 mg/dL (ref 8.9–10.3)
Chloride: 105 mmol/L (ref 98–111)
Creatinine, Ser: 2.31 mg/dL — ABNORMAL HIGH (ref 0.61–1.24)
GFR calc Af Amer: 41 mL/min — ABNORMAL LOW (ref >60)
GFR calc non Af Amer: 35 mL/min — ABNORMAL LOW (ref >60)
Glucose, Bld: 212 mg/dL — ABNORMAL HIGH (ref 70–99)
Potassium: 3.8 mmol/L (ref 3.5–5.1)
Sodium: 138 mmol/L (ref 135–145)

## 2018-11-27 LAB — RAPID URINE DRUG SCREEN, HOSP PERFORMED
Amphetamines: NOT DETECTED
Barbiturates: NOT DETECTED
Benzodiazepines: NOT DETECTED
Cocaine: NOT DETECTED
Opiates: NOT DETECTED
Tetrahydrocannabinol: NOT DETECTED

## 2018-11-27 LAB — URINALYSIS, ROUTINE W REFLEX MICROSCOPIC
Bacteria, UA: NONE SEEN
Bilirubin Urine: NEGATIVE
Glucose, UA: >=500 mg/dL — AB
Ketones, ur: NEGATIVE mg/dL
Leukocytes,Ua: NEGATIVE
Nitrite: NEGATIVE
Protein, ur: 100 mg/dL — AB
Specific Gravity, Urine: 1.003 — ABNORMAL LOW (ref 1.005–1.030)
pH: 6 (ref 5.0–8.0)

## 2018-11-27 LAB — CBG MONITORING, ED: Glucose-Capillary: 145 mg/dL — ABNORMAL HIGH (ref 70–99)

## 2018-11-27 LAB — CBC
HCT: 44 % (ref 39.0–52.0)
Hemoglobin: 15 g/dL (ref 13.0–17.0)
MCH: 27.8 pg (ref 26.0–34.0)
MCHC: 34.1 g/dL (ref 30.0–36.0)
MCV: 81.6 fL (ref 80.0–100.0)
Platelets: 213 10*3/uL (ref 150–400)
RBC: 5.39 MIL/uL (ref 4.22–5.81)
RDW: 13.4 % (ref 11.5–15.5)
WBC: 6.2 10*3/uL (ref 4.0–10.5)
nRBC: 0 % (ref 0.0–0.2)

## 2018-11-27 LAB — GLUCOSE, CAPILLARY
Glucose-Capillary: 129 mg/dL — ABNORMAL HIGH (ref 70–99)
Glucose-Capillary: 169 mg/dL — ABNORMAL HIGH (ref 70–99)

## 2018-11-27 LAB — ECHOCARDIOGRAM COMPLETE
Height: 70 in
Weight: 4196.8 oz

## 2018-11-27 LAB — BRAIN NATRIURETIC PEPTIDE: B Natriuretic Peptide: 402.3 pg/mL — ABNORMAL HIGH (ref 0.0–100.0)

## 2018-11-27 LAB — TSH: TSH: 3.112 u[IU]/mL (ref 0.350–4.500)

## 2018-11-27 LAB — SARS CORONAVIRUS 2 (TAT 6-24 HRS): SARS Coronavirus 2: NEGATIVE

## 2018-11-27 LAB — HEMOGLOBIN A1C
Hgb A1c MFr Bld: 8.1 % — ABNORMAL HIGH (ref 4.8–5.6)
Mean Plasma Glucose: 185.77 mg/dL

## 2018-11-27 MED ORDER — AMLODIPINE BESYLATE 10 MG PO TABS
10.0000 mg | ORAL_TABLET | Freq: Every day | ORAL | Status: DC
  Administered 2018-11-27 – 2018-11-30 (×4): 10 mg via ORAL
  Filled 2018-11-27: qty 1
  Filled 2018-11-27: qty 2
  Filled 2018-11-27 (×2): qty 1

## 2018-11-27 MED ORDER — SODIUM CHLORIDE 0.9% FLUSH
3.0000 mL | Freq: Two times a day (BID) | INTRAVENOUS | Status: DC
  Administered 2018-11-27 – 2018-11-29 (×5): 3 mL via INTRAVENOUS

## 2018-11-27 MED ORDER — NITROGLYCERIN 0.4 MG SL SUBL
0.4000 mg | SUBLINGUAL_TABLET | SUBLINGUAL | Status: DC | PRN
  Administered 2018-11-27 (×3): 0.4 mg via SUBLINGUAL
  Filled 2018-11-27 (×2): qty 1

## 2018-11-27 MED ORDER — ATORVASTATIN CALCIUM 40 MG PO TABS
40.0000 mg | ORAL_TABLET | Freq: Every day | ORAL | Status: DC
  Administered 2018-11-27 – 2018-11-29 (×3): 40 mg via ORAL
  Filled 2018-11-27 (×3): qty 1

## 2018-11-27 MED ORDER — ACETAMINOPHEN 500 MG PO TABS
1000.0000 mg | ORAL_TABLET | Freq: Once | ORAL | Status: AC
  Administered 2018-11-27: 1000 mg via ORAL
  Filled 2018-11-27: qty 2

## 2018-11-27 MED ORDER — LABETALOL HCL 100 MG PO TABS
150.0000 mg | ORAL_TABLET | Freq: Two times a day (BID) | ORAL | Status: DC
  Administered 2018-11-27 – 2018-11-28 (×2): 150 mg via ORAL
  Filled 2018-11-27: qty 0.5
  Filled 2018-11-27 (×2): qty 2

## 2018-11-27 MED ORDER — ENOXAPARIN SODIUM 40 MG/0.4ML ~~LOC~~ SOLN
40.0000 mg | SUBCUTANEOUS | Status: DC
  Administered 2018-11-27 – 2018-11-29 (×3): 40 mg via SUBCUTANEOUS
  Filled 2018-11-27 (×3): qty 0.4

## 2018-11-27 MED ORDER — SODIUM CHLORIDE 0.9% FLUSH: 3.0000 mL | INTRAVENOUS | Status: DC | PRN

## 2018-11-27 MED ORDER — ACETAMINOPHEN 325 MG PO TABS
650.0000 mg | ORAL_TABLET | ORAL | Status: DC | PRN
  Administered 2018-11-27: 650 mg via ORAL
  Filled 2018-11-27: qty 2

## 2018-11-27 MED ORDER — NITROGLYCERIN IN D5W 200-5 MCG/ML-% IV SOLN
0.0000 ug/min | INTRAVENOUS | Status: DC
  Administered 2018-11-27 (×2): 5 ug/min via INTRAVENOUS
  Filled 2018-11-27: qty 250

## 2018-11-27 MED ORDER — ONDANSETRON HCL 4 MG/2ML IJ SOLN
4.0000 mg | Freq: Four times a day (QID) | INTRAMUSCULAR | Status: DC | PRN
  Administered 2018-11-28 – 2018-11-29 (×2): 4 mg via INTRAVENOUS
  Filled 2018-11-27 (×2): qty 2

## 2018-11-27 MED ORDER — CLONIDINE HCL 0.1 MG PO TABS
0.1000 mg | ORAL_TABLET | Freq: Two times a day (BID) | ORAL | Status: DC
  Administered 2018-11-27 – 2018-11-28 (×3): 0.1 mg via ORAL
  Filled 2018-11-27 (×3): qty 1

## 2018-11-27 MED ORDER — INSULIN ASPART PROT & ASPART (70-30 MIX) 100 UNIT/ML ~~LOC~~ SUSP
26.0000 [IU] | Freq: Two times a day (BID) | SUBCUTANEOUS | Status: DC
  Administered 2018-11-28 – 2018-11-30 (×5): 26 [IU] via SUBCUTANEOUS
  Filled 2018-11-27: qty 10

## 2018-11-27 MED ORDER — NITROGLYCERIN 2 % TD OINT
1.0000 [in_us] | TOPICAL_OINTMENT | Freq: Once | TRANSDERMAL | Status: AC
  Administered 2018-11-27: 1 [in_us] via TOPICAL
  Filled 2018-11-27: qty 1

## 2018-11-27 MED ORDER — SODIUM CHLORIDE 0.9% FLUSH: 3.0000 mL | Freq: Once | INTRAVENOUS | Status: DC

## 2018-11-27 MED ORDER — HYDRALAZINE HCL 50 MG PO TABS
100.0000 mg | ORAL_TABLET | Freq: Two times a day (BID) | ORAL | Status: DC
  Administered 2018-11-27 – 2018-11-28 (×3): 100 mg via ORAL
  Filled 2018-11-27: qty 4
  Filled 2018-11-27 (×2): qty 2

## 2018-11-27 MED ORDER — INSULIN ASPART 100 UNIT/ML ~~LOC~~ SOLN: 0.0000 [IU] | Freq: Every day | SUBCUTANEOUS | Status: DC

## 2018-11-27 MED ORDER — NITROGLYCERIN 0.4 MG SL SUBL: 0.4000 mg | SUBLINGUAL_TABLET | SUBLINGUAL | Status: DC | PRN

## 2018-11-27 MED ORDER — SODIUM CHLORIDE 0.9 % IV SOLN: 250.0000 mL | INTRAVENOUS | Status: DC | PRN

## 2018-11-27 MED ORDER — INSULIN ASPART 100 UNIT/ML ~~LOC~~ SOLN
0.0000 [IU] | Freq: Three times a day (TID) | SUBCUTANEOUS | Status: DC
  Administered 2018-11-27 (×2): 2 [IU] via SUBCUTANEOUS
  Administered 2018-11-28: 3 [IU] via SUBCUTANEOUS
  Administered 2018-11-29: 5 [IU] via SUBCUTANEOUS
  Administered 2018-11-29: 09:00:00 2 [IU] via SUBCUTANEOUS

## 2018-11-27 MED ORDER — ASPIRIN EC 81 MG PO TBEC
81.0000 mg | DELAYED_RELEASE_TABLET | Freq: Every day | ORAL | Status: DC
  Administered 2018-11-28 – 2018-11-30 (×3): 81 mg via ORAL
  Filled 2018-11-27 (×4): qty 1

## 2018-11-27 MED ORDER — HYDROCODONE-ACETAMINOPHEN 5-325 MG PO TABS
1.0000 | ORAL_TABLET | Freq: Four times a day (QID) | ORAL | Status: DC | PRN
  Administered 2018-11-27 – 2018-11-28 (×2): 2 via ORAL
  Administered 2018-11-29 (×2): 1 via ORAL
  Filled 2018-11-27: qty 1
  Filled 2018-11-27 (×2): qty 2
  Filled 2018-11-27: qty 1

## 2018-11-27 NOTE — ED Notes (Signed)
IV attempts by PA unsuccessful.  MD in room with [place] to insert.  Pt remains restufl without s/sx.

## 2018-11-27 NOTE — ED Notes (Signed)
Attempt to call report x 1  

## 2018-11-27 NOTE — H&P (Signed)
History and Physical    Harold Clements [id number] DOB: 02/16/1981 DOA: 11/27/2018  PCP:  Dwyane Dee Consultants:  None Patient coming from:  Home - lives with wife and 2 kids; NOK: Wife, [phone]  Chief Complaint: SOB  HPI: Harold Clements is a 38 y.o. male with medical history significant of DM; HTN; and stage 2 CKD presenting with SOB.  He reports acute onset of SOB about 3AM.  He was unable to sleep, was sitting up and watching tv feeling ok and then he became SOB.  No cough/wheeze.  No chest pain.  His BP is poorly controlled at baseline.  He misses no more than a couple of doses of medications in a week - he gets up early to go to work and sometimes forgets.  He also has DM and this is also poorly controlled.    He reports hospitalization about 5 months ago with SOB, chest heaviness in [facility].  They called 911 and they thought he was having a heart attack "but I wasn't.  It was the fluid build up."  He was hospitalized at [facility] and he did have an echo, uncertain results.  They started giving him Lasix from then on.  He has not had problems until now.     ED Course: HTN urgency, poor control despite multiple meds.  Reports med compliance. 2AM with acute SOB, no CP.  He took Lasix and Labetalol with mild improvement.  BP 256/179 on arrival.  Denies h/o CHF but similar presentation in [facility] about 5 months ago and started on Lasix but denies diagnosis of heart failure.  Mildly worsened creatinine 1.7->2.31.  +interstitial edema.  Started on NTG drip (improved to 200s with paste and SL).    Review of Systems: As per HPI; otherwise review of systems reviewed and negative.   Ambulatory Status:  Ambulates without assistance  Past Medical History:  Diagnosis Date  . 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 Diabetes mellitus without complication ([place])   . Ear pain 07/2017  . Hypertension   . Wasp sting 08/04/2016   of dorsum of left hand near thumb    History  reviewed. No pertinent surgical history.  Social History   Socioeconomic History  . Marital status: Married    Spouse name: Not on file  . Number of children: Not on file  . Years of education: Not on file  . Highest education level: Not on file  Occupational History  . Occupation: Engineer, agricultural  Social Needs  . Financial resource strain: Not on file  . Food insecurity    Worry: Not on file    Inability: Not on file  . Transportation needs    Medical: Not on file    Non-medical: Not on file  Tobacco Use  . Smoking status: Former Smoker    Years: 4.00    Types: Cigars    Quit date: 2010    Years since quitting: 10.7  . Smokeless tobacco: Never Used  Substance and Sexual Activity  . Alcohol use: No  . Drug use: No  . Sexual activity: Yes  Lifestyle  . Physical activity    Days per week: Not on file    Minutes per session: Not on file  . Stress: Not on file  Relationships  . Social Herbalist on phone: Not on file    Gets together: Not on file    Attends religious service: Not on  file    Active member of club or organization: Not on file    Attends meetings of clubs or organizations: Not on file    Relationship status: Not on file  . Intimate partner violence    Fear of current or ex partner: Not on file    Emotionally abused: Not on file    Physically abused: Not on file    Forced sexual activity: Not on file  Other Topics Concern  . Not on file  Social History Narrative  . Not on file    Allergies  Allergen Reactions  . Zithromax [Azithromycin Dihydrate] Anaphylaxis  . Kiwi Extract Other (See Comments)    Tongue goes numb    Family History  Problem Relation Age of Onset  . Hypertension Mother   . Diabetes Mother   . Heart disease Mother   . Hypertension Father     Prior to Admission medications   Medication Sig Start Date End Date Taking? Authorizing Provider  amLODipine (NORVASC) 10 MG tablet Take 1 tablet (10 mg total) by mouth  daily. 08/14/17   Molt, Bethany, DO  atorvastatin (LIPITOR) 40 MG tablet Take 1 tablet (40 mg total) by mouth daily at 6 PM. 08/17/17   Lenore Cordia, MD  canagliflozin [facility]) 100 MG TABS tablet Take 1 tablet (100 mg total) by mouth daily before breakfast. 08/17/17   Lenore Cordia, MD  hydrochlorothiazide (HYDRODIURIL) 25 MG tablet Take 1 tablet (25 mg total) by mouth daily. 08/14/17   Molt, Bethany, DO  lisinopril (PRINIVIL,ZESTRIL) 40 MG tablet Take 1 tablet (40 mg total) by mouth daily. IM program. 08/14/17   Molt, Bethany, DO  metFORMIN (GLUCOPHAGE) 1000 MG tablet Take 1 tablet (1,000 mg total) by mouth 2 (two) times daily with a meal. IM program. Patient taking differently: Take 1,000 mg by mouth 2 (two) times daily with a meal.  08/13/17 03/29/18  Molt, Bethany, DO  spironolactone (ALDACTONE) 50 MG tablet Take 1 tablet (50 mg total) by mouth daily. IM Program Patient taking differently: Take 50 mg by mouth daily.  08/17/17 03/29/18  Lenore Cordia, MD    Physical Exam: Vitals:   11/27/18 1330 11/27/18 1335 11/27/18 1340 11/27/18 1345  BP: 135/83 (!) 142/99 (!) 152/93 (!) 159/112  Pulse:      Resp: (!) 26 (!) 28 (!) 29 (!) 22  Temp:      TempSrc:      SpO2:         . General:  Appears calm and comfortable and is NAD . Eyes:  PERRL, EOMI, normal lids, iris . ENT:  grossly normal hearing, lips & tongue, mmm . Neck:  no LAD, masses or thyromegaly . Cardiovascular:  RRR, no m/r/g. No LE edema.  Marland Kitchen Respiratory:   CTA bilaterally with no wheezes/rales/rhonchi.  Normal respiratory effort. . Abdomen:  soft, NT, ND, NABS . Skin:  no rash or induration seen on limited exam . Musculoskeletal:  grossly normal tone BUE/BLE, good ROM, no bony abnormality . Psychiatric:  grossly normal mood and affect, speech fluent and appropriate, AOx3 . Neurologic:  CN 2-12 grossly intact, moves all extremities in coordinated fashion, sensation intact    Radiological Exams on Admission: Dg Chest 2 View   Result Date: 11/27/2018 CLINICAL DATA:  Shortness of breath. EXAM: CHEST - 2 VIEW COMPARISON:  03/29/2018 FINDINGS: The cardio pericardial silhouette is enlarged. Diffuse interstitial opacity noted. Vascular anatomy not well demonstrated raising the question of interstitial edema. Patchy opacity at the  bases likely atelectatic although pneumonia not excluded. The visualized bony structures of the thorax are intact. IMPRESSION: Diffuse interstitial opacity raising the question of interstitial edema. Patchy opacity at both lung bases likely atelectatic although pneumonia not excluded. Electronically Signed   By: Misty Stanley M.D.   On: 11/27/2018 06:11    EKG: Independently reviewed.  Sinus tachcyardia with rate 107; no evidence of acute ischemia; NSCSLT   Labs on Admission: I have personally reviewed the available labs and imaging studies at the time of the admission.  Pertinent labs:   CO2 21 BUN 23/Creatinine 2.31/GFR 41; baseline 1.7-1.8 prior BNP 402.3 Normal CBC UA: >500 glucose, 100 protein UDS: negative   Assessment/Plan Principal Problem:   Hypertensive crisis Active Problems:   Diabetes (HCC)   CKD (chronic kidney disease) stage 2, GFR 60-89 ml/min   Obesity, Class III, BMI 40-49.9 (morbid obesity) ([place])   Hypertensive crisis -Patient presenting with severely elevated BP and SOB concerning for hypertensive crisis -He may have evidence of end organ failure, with acute renal failure, although this may also represent progressive CKD in the setting of poorly controlled HTN and DM -The initial goal of therapy would generally be to decrease the MAP by no more than 25% in the first hour and then continue to decrease the BP additionally within the next 2-6 hours -The patient received NTG drip while in the ER with modest improvement -Home medications (Clonidine, Hydralazine, Labetalol, Norvasc) were resumed with marked improvement; as such, the NTG drip has been titrated to off  -Will observe for now in progressive care -We had a serious discussion about the negative health ramifications associated with his ongoing medical non-compliance and suboptimal HTN and glycemic control; his mother died of ESRD and his brother is on HD at this time and so he understands the consequences associated with ongoing poor life choices -Will obtain Echo to evaluate for possible underlying CHF, although he did not appear to be particularly volume overloaded at the time of my evaluation -Will also attempt to obtain medical records from his hospitalization earlier this year with similar symptoms  DM -Prior A1c was 9.1 in 6/19 -hold Glucophage, Invokana -Continue 70/30 insulin -Cover with resistant-scale SSI  AKI on stage 2 CKD vs. Progression to Stage 3 CKD -Prior baseline creatinine was 1.7-1.8 -Current creatinine 2.3 -This may be associated with end organ dysfunction indicative of hypertensive emergency, or may indicate progressive CKD in the setting of uncontrolled HTN/DM -He does have a strong FH of ESRD -Hold diuretics for now, control DM and HTN -Recheck BMP in AM  Obesity -BMI 40.2 -Weight loss should be encouraged -Outpatient PCP/bariatric medicine/bariatric surgery f/u encouraged -He has significant snoring and has a habitus c/w OSA; suggest outpatient sleep study    Note: This patient has been tested and is pending for the novel coronavirus COVID-19.  DVT prophylaxis:  Lovenox  Code Status:  Full - confirmed with patient Family Communication: None present Disposition Plan:  Home once clinically improved Consults called: None  Admission status: It is my clinical opinion that referral for OBSERVATION is reasonable and necessary in this patient based on the above information provided. The aforementioned taken together are felt to place the patient at high risk for further clinical deterioration. However it is anticipated that the patient may be medically stable for  discharge from the hospital within 24 to 48 hours.    Karmen Bongo MD [facility]   How to contact the [facility] Attending or Consulting provider 7A -  7P or covering provider during after hours [place], for this patient?  1. Check the care team in [facility] and look for a) attending/consulting TRH provider listed and b) the [facility] team listed 2. Log into [email or web address] and use [place]'s universal password to access. If you do not have the password, please contact the hospital operator. 3. Locate the [facility] provider you are looking for under [facility] and page to a number that you can be directly reached. 4. If you still have difficulty reaching the provider, please page the [facility] (Director on Call) for the Hospitalists listed on amion for assistance.   11/27/2018, 2:20 PM

## 2018-11-27 NOTE — ED Provider Notes (Signed)
[facility] EMERGENCY DEPARTMENT Provider Note   CSN: [id number] Arrival date & time: 11/27/18  0451     History   Chief Complaint Chief Complaint  Patient presents with   Shortness of Breath    HPI Harold Clements is a 38 y.o. male with PMH/o CKD, DM, HTN who presents for evaluation of shortness of breath that began last night.  He reports that at 2 AM, he was having difficulty going to sleep secondary to trouble breathing.  He reports that he could not lie flat because he could not catch his breath.  He tried propping himself up and sitting up but states that the symptoms did not get any better.  He states he did not have any chest pain at that time.  He states that because it continued, he came to the emergency department.  He did take Lasix and labetalol when he got to the emergency department.  He does have history of high blood pressure and is on several medications.  He has had a renal ultrasound to rule any renal artery stenosis.  Patient states he had a similar episode about 5 months ago in [place] that required admission.  He states that he has not been diagnosed with any heart failure but is currently on Lasix to help with fluid overload.  Patient states that he feels slightly better at this time but still feels some mild shortness of breath, most notably with exertion.  He states he has not been sick with any recent fevers cough, chills, congestion.  He denies any leg swelling, abdominal pain, nausea/vomiting, chest pain.  He denies any recent travel or known COVID-19 exposure.     The history is provided by the patient.    Past Medical History:  Diagnosis Date   CKD (chronic kidney disease) stage 2, GFR 60-89 ml/min    Diabetes mellitus without complication ([place])    Ear pain 07/2017   Hypertension    Wasp sting 08/04/2016   of dorsum of left hand near thumb    Patient Active Problem List   Diagnosis Date Noted   Hypertensive crisis  11/27/2018   Mastoiditis 08/12/2017   CKD (chronic kidney disease) stage 2, GFR 60-89 ml/min 08/17/2016   Hypertension associated with diabetes ([place]) 08/06/2016   Diabetes ([place]) 08/06/2016    History reviewed. No pertinent surgical history.      Home Medications    Prior to Admission medications   Medication Sig Start Date End Date Taking? Authorizing Provider  cloNIDine (CATAPRES) 0.1 MG tablet Take 0.1 mg by mouth 2 (two) times daily.   Yes [provider]  furosemide (LASIX) 40 MG tablet Take 20 mg by mouth daily.   Yes [provider]  hydrALAZINE (APRESOLINE) 50 MG tablet Take 100 mg by mouth 2 (two) times daily.   Yes [provider]  Insulin Isophane & Regular Human (NOVOLIN 70/30 FLEXPEN) (70-30) 100 UNIT/ML PEN Inject 26 Units into the skin 2 (two) times daily.   Yes [provider]  labetalol (NORMODYNE) 100 MG tablet Take 150 mg by mouth 2 (two) times daily.   Yes [provider]  potassium chloride (KLOR-CON) 20 MEQ packet Take 20 mEq by mouth daily.   Yes [provider]  amLODipine (NORVASC) 10 MG tablet Take 1 tablet (10 mg total) by mouth daily. Patient not taking: Reported on 11/27/2018 08/14/17   Molt, Bethany, DO  atorvastatin (LIPITOR) 40 MG tablet Take 1 tablet (40 mg total)  by mouth daily at 6 PM. Patient not taking: Reported on 11/27/2018 08/17/17   Lenore Cordia, MD  canagliflozin [facility]) 100 MG TABS tablet Take 1 tablet (100 mg total) by mouth daily before breakfast. Patient not taking: Reported on 11/27/2018 08/17/17   Lenore Cordia, MD  metFORMIN (GLUCOPHAGE) 1000 MG tablet Take 1 tablet (1,000 mg total) by mouth 2 (two) times daily with a meal. IM program. Patient not taking: Reported on 11/27/2018 08/13/17 03/29/18  Molt, Bethany, DO  spironolactone (ALDACTONE) 50 MG tablet Take 1 tablet (50 mg total) by mouth daily. IM Program Patient not taking: Reported on 11/27/2018 08/17/17 03/29/18  Lenore Cordia,  MD    Family History Family History  Problem Relation Age of Onset   Hypertension Mother    Diabetes Mother    Heart disease Mother    Hypertension Father     Social History Social History   Tobacco Use   Smoking status: Former Smoker    Years: 4.00    Types: Cigars    Quit date: 2010    Years since quitting: 10.7   Smokeless tobacco: Never Used  Substance Use Topics   Alcohol use: No   Drug use: No     Allergies   Zithromax [azithromycin dihydrate] and Kiwi extract   Review of Systems Review of Systems  Constitutional: Negative for fever.  Respiratory: Positive for shortness of breath. Negative for cough.   Cardiovascular: Negative for chest pain and leg swelling.  Gastrointestinal: Negative for abdominal pain, nausea and vomiting.  Genitourinary: Negative for dysuria and hematuria.  Neurological: Negative for weakness, numbness and headaches.  All other systems reviewed and are negative.    Physical Exam Updated Vital Signs BP (!) 185/116    Pulse 80    Temp 98.3 F (36.8 C) (Oral)    Resp (!) 26    SpO2 94%   Physical Exam Vitals signs and nursing note reviewed.  Constitutional:      Appearance: Normal appearance. He is well-developed.  HENT:     Head: Normocephalic and atraumatic.  Eyes:     General: Lids are normal.     Conjunctiva/sclera: Conjunctivae normal.     Pupils: Pupils are equal, round, and reactive to light.  Neck:     Musculoskeletal: Full passive range of motion without pain.  Cardiovascular:     Rate and Rhythm: Normal rate and regular rhythm.     Pulses: Normal pulses.          Dorsalis pedis pulses are 2+ on the right side and 2+ on the left side.     Heart sounds: Normal heart sounds. No murmur. No friction rub. No gallop.   Pulmonary:     Effort: Pulmonary effort is normal.     Breath sounds: Rales present.     Comments: Fine crackles noted at bilateral bases.  No evidence of respiratory distress. Abdominal:      Palpations: Abdomen is soft. Abdomen is not rigid.     Tenderness: There is no abdominal tenderness. There is no guarding.     Comments: Abdomen soft, nondistended.  No tenderness noted.  No rigidity, guarding.  Musculoskeletal: Normal range of motion.     Comments: Bilateral lower extremities are symmetric in appearance without any overlying warmth, erythema, edema.  Skin:    General: Skin is warm and dry.     Capillary Refill: Capillary refill takes less than 2 seconds.  Neurological:     Mental Status: He  is alert and oriented to person, place, and time.  Psychiatric:        Speech: Speech normal.      ED Treatments / Results  Labs (all labs ordered are listed, but only abnormal results are displayed) Labs Reviewed  BASIC METABOLIC PANEL - Abnormal; Notable for the following components:      Result Value   CO2 21 (*)    Glucose, Bld 212 (*)    BUN 23 (*)    Creatinine, Ser 2.31 (*)    GFR calc non Af Amer 35 (*)    GFR calc Af Amer 41 (*)    All other components within normal limits  URINALYSIS, ROUTINE W REFLEX MICROSCOPIC - Abnormal; Notable for the following components:   Color, Urine COLORLESS (*)    Specific Gravity, Urine 1.003 (*)    Glucose, UA >=500 (*)    Hgb urine dipstick SMALL (*)    Protein, ur 100 (*)    All other components within normal limits  BRAIN NATRIURETIC PEPTIDE - Abnormal; Notable for the following components:   B Natriuretic Peptide 402.3 (*)    All other components within normal limits  CBG MONITORING, ED - Abnormal; Notable for the following components:   Glucose-Capillary 145 (*)    All other components within normal limits  SARS CORONAVIRUS 2 (TAT 6-24 HRS)  CBC  RAPID URINE DRUG SCREEN, HOSP PERFORMED  HEMOGLOBIN A1C  HIV ANTIBODY (ROUTINE TESTING W REFLEX)  TSH    EKG EKG Interpretation  Date/Time:  Sunday November 27 2018 04:57:49 EDT Ventricular Rate:  107 PR Interval:    QRS Duration: 94 QT Interval:  342 QTC  Calculation: 457 R Axis:   23 Text Interpretation:  Sinus tachycardia Biatrial enlargement no change from previous Confirmed by Charlesetta Shanks [phone]) on 11/27/2018 7:49:00 AM   Radiology Dg Chest 2 View  Result Date: 11/27/2018 CLINICAL DATA:  Shortness of breath. EXAM: CHEST - 2 VIEW COMPARISON:  03/29/2018 FINDINGS: The cardio pericardial silhouette is enlarged. Diffuse interstitial opacity noted. Vascular anatomy not well demonstrated raising the question of interstitial edema. Patchy opacity at the bases likely atelectatic although pneumonia not excluded. The visualized bony structures of the thorax are intact. IMPRESSION: Diffuse interstitial opacity raising the question of interstitial edema. Patchy opacity at both lung bases likely atelectatic although pneumonia not excluded. Electronically Signed   By: Misty Stanley M.D.   On: 11/27/2018 06:11    Procedures .Critical Care Performed by: Volanda Napoleon, PA-C Authorized by: Volanda Napoleon, PA-C   Critical care provider statement:    Critical care time (minutes):  35   Critical care was necessary to treat or prevent imminent or life-threatening deterioration of the following conditions: Hypertensive emergency.   Critical care was time spent personally by me on the following activities:  Discussions with consultants, evaluation of patient's response to treatment, examination of patient, ordering and performing treatments and interventions, ordering and review of laboratory studies, ordering and review of radiographic studies, pulse oximetry, re-evaluation of patient's condition, obtaining history from patient or surrogate and review of old charts   (including critical care time)  Medications Ordered in ED Medications  nitroGLYCERIN 50 mg in dextrose 5 % 250 mL (0.2 mg/mL) infusion (10 mcg/min Intravenous Rate/Dose Change 11/27/18 1258)  amLODipine (NORVASC) tablet 10 mg (10 mg Oral Given 11/27/18 1218)  cloNIDine (CATAPRES) tablet  0.1 mg (0.1 mg Oral Given 11/27/18 1217)  hydrALAZINE (APRESOLINE) tablet 100 mg (100 mg Oral Given 11/27/18  1217)  labetalol (NORMODYNE) tablet 150 mg (has no administration in time range)  atorvastatin (LIPITOR) tablet 40 mg (has no administration in time range)  insulin aspart protamine- aspart (NOVOLOG MIX 70/30) injection 26 Units (has no administration in time range)  insulin aspart (novoLOG) injection 0-15 Units (has no administration in time range)  enoxaparin (LOVENOX) injection 40 mg (has no administration in time range)  sodium chloride flush (NS) 0.9 % injection 3 mL (has no administration in time range)  0.9 %  sodium chloride infusion (has no administration in time range)  aspirin EC tablet 81 mg (has no administration in time range)  nitroGLYCERIN (NITROSTAT) SL tablet 0.4 mg (has no administration in time range)  acetaminophen (TYLENOL) tablet 650 mg (has no administration in time range)  ondansetron (ZOFRAN) injection 4 mg (has no administration in time range)  insulin aspart (novoLOG) injection 0-5 Units (has no administration in time range)  nitroGLYCERIN (NITROGLYN) 2 % ointment 1 inch (1 inch Topical Given 11/27/18 0936)  acetaminophen (TYLENOL) tablet 1,000 mg (1,000 mg Oral Given 11/27/18 1108)     Initial Impression / Assessment and Plan / ED Course  I have reviewed the triage vital signs and the nursing notes.  Pertinent labs & imaging results that were available during my care of the patient were reviewed by me and considered in my medical decision making (see chart for details).        38 year old male with past history of hypertension, diabetes who presents for evaluation of shortness of breath that began acutely at 2 AM this morning.  He reports he was attempting to go to sleep and could not sleep secondary to shortness of breath.  He denies any chest pain.  No recent fevers, cough, congestion.  He states that he had a similar episode about 5 months ago that  required hospitalization to get fluid off.  He states he has not been diagnosed with CHF but is on Lasix.  Initially arrival, he is afebrile but is hypertensive with systolic blood pressure in the 200s.  He is slightly tachypneic.  Vitals otherwise stable.  On exam, he has some fine crackles noted at the bases.  Concern for CHF versus hypertensive emergency.  He does not have any chest pain at this time.  Low suspicion for ACS etiology.  Additionally, low suspicion for infectious etiology.  Doubt PE.  Labs, imaging ordered at triage.  BNP is elevated at 402.3.  UA shows small hemoglobin, no infectious etiology.  BMP shows bicarb of 21, BUN of 23, creatinine of 2.31. His baseline Cr is 1.7 from a few months ago.  CBC without any significant leukocytosis or anemia.  Chest x-ray shows diffuse interstitial opacity concerning for interstitial edema.  At this time, given blood pressures, signs of endorgan damage with edema noted on chest x-ray and worsening kidney function as well as his symptoms, I am concerned for hypertensive emergency.  I feel that he needs admission and starting on nitro drip for blood pressure control. Discussed patient with Dr. Johnney Clements who is agreeable to plan.   Discussed patient with Dr. Lorin Mercy (hospitalist). Will plan to admit.   Portions of this note were generated with [employer]. Dictation errors may occur despite best attempts at proofreading.   Final Clinical Impressions(s) / ED Diagnoses   Final diagnoses:  Hypertensive emergency  SOB (shortness of breath)    ED Discharge Orders    None       Volanda Napoleon, PA-C 11/27/18  [place], MD 11/27/18 1442

## 2018-11-27 NOTE — ED Notes (Signed)
Patient took his morning meds of Labetalol and Lasix.

## 2018-11-27 NOTE — ED Notes (Signed)
Attempt x 2 for IV access unsuccessful.  MD notified for attempt.

## 2018-11-27 NOTE — ED Notes (Signed)
Pt denies any pain to IV site.  Dr. Lorin Mercy finished with admission eval.

## 2018-11-27 NOTE — ED Notes (Signed)
Pt IV not infusing, infiltration and edema noted at the site.  MD notified.

## 2018-11-27 NOTE — ED Triage Notes (Signed)
Patient from home, went to bed, laying flat and having shortness of breath.  No diagnosis of CHF, but has had problems with shortness of breath and edema in past.  Patient has had a reduction in his Lasix within the last month.  No chest pain.

## 2018-11-27 NOTE — ED Notes (Signed)
ED TO INPATIENT HANDOFF REPORT  ED Nurse Name and Phone #: Celene Squibb RN  S Name/Age/Gender Harold Clements 38 y.o. male Room/Bed: 019C/019C  Code Status   Code Status: Full Code  Home/SNF/Other Home Patient oriented to: self, place, time and situation Is this baseline? Yes   Triage Complete: Triage complete  Chief Complaint shob  Triage Note Patient from home, went to bed, laying flat and having shortness of breath.  No diagnosis of CHF, but has had problems with shortness of breath and edema in past.  Patient has had a reduction in his Lasix within the last month.  No chest pain.     Allergies Allergies  Allergen Reactions  . Zithromax [Azithromycin Dihydrate] Anaphylaxis  . Kiwi Extract Other (See Comments)    Tongue goes numb    Level of Care/Admitting Diagnosis ED Disposition    ED Disposition Condition Comment   Admit  Hospital Area: [facility] [[id number]]  Level of Care: Progressive [102]  I expect the patient will be discharged within 24 hours: No (not a candidate for 5C-Observation unit)  Covid Evaluation: Asymptomatic Screening Protocol (No Symptoms)  Diagnosis: Hypertensive crisis [id number]  Admitting Physician: Karmen Bongo [[id number]]  Attending Physician: Karmen Bongo [[id number]]  PT Class (Do Not Modify): Observation [104]  PT Acc Code (Do Not Modify): Observation [10022]       B Medical/Surgery History Past Medical History:  Diagnosis Date  . 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 Diabetes mellitus without complication ([place])   . Ear pain 07/2017  . Hypertension   . Wasp sting 08/04/2016   of dorsum of left hand near thumb   History reviewed. No pertinent surgical history.   A IV Location/Drains/Wounds Patient Lines/Drains/Airways Status   Active Line/Drains/Airways    Name:   Placement date:   Placement time:   Site:   Days:   Peripheral IV 11/27/18 Left Antecubital   11/27/18    0928    Antecubital    less than 1          Intake/Output Last 24 hours  Intake/Output Summary (Last 24 hours) at 11/27/2018 1419 Last data filed at 11/27/2018 0956 Gross per 24 hour  Intake 0 ml  Output -  Net 0 ml    Labs/Imaging Results for orders placed or performed during the hospital encounter of 11/27/18 (from the past 48 hour(s))  Basic metabolic panel     Status: Abnormal   Collection Time: 11/27/18  5:04 AM  Result Value Ref Range   Sodium 138 135 - 145 mmol/L   Potassium 3.8 3.5 - 5.1 mmol/L   Chloride 105 98 - 111 mmol/L   CO2 21 (L) 22 - 32 mmol/L   Glucose, Bld 212 (H) 70 - 99 mg/dL   BUN 23 (H) 6 - 20 mg/dL   Creatinine, Ser 2.31 (H) 0.61 - 1.24 mg/dL   Calcium 9.3 8.9 - 10.3 mg/dL   GFR calc non Af Amer 35 (L) >60 mL/min   GFR calc Af Amer 41 (L) >60 mL/min   Anion gap 12 5 - 15    Comment: Performed at [facility], [address] [place] [address]  CBC     Status: None   Collection Time: 11/27/18  5:04 AM  Result Value Ref Range   WBC 6.2 4.0 - 10.5 K/uL   RBC 5.39 4.22 - 5.81 MIL/uL   Hemoglobin 15.0 13.0 - 17.0 g/dL  HCT 44.0 39.0 - 52.0 %   MCV 81.6 80.0 - 100.0 fL   MCH 27.8 26.0 - 34.0 pg   MCHC 34.1 30.0 - 36.0 g/dL   RDW 13.4 11.5 - 15.5 %   Platelets 213 150 - 400 K/uL   nRBC 0.0 0.0 - 0.2 %    Comment: Performed at [facility], [place] [address], [place], [place] [address]  Brain natriuretic peptide     Status: Abnormal   Collection Time: 11/27/18  5:04 AM  Result Value Ref Range   B Natriuretic Peptide 402.3 (H) 0.0 - 100.0 pg/mL    Comment: Performed at [facility] [address], [place], [place] [address]  Urinalysis, Routine w reflex microscopic     Status: Abnormal   Collection Time: 11/27/18  5:08 AM  Result Value Ref Range   Color, Urine COLORLESS (A) YELLOW   APPearance CLEAR CLEAR   Specific Gravity, Urine 1.003 (L) 1.005 - 1.030   pH 6.0 5.0 - 8.0   Glucose, UA >=500 (A) NEGATIVE mg/dL   Hgb urine dipstick SMALL (A)  NEGATIVE   Bilirubin Urine NEGATIVE NEGATIVE   Ketones, ur NEGATIVE NEGATIVE mg/dL   Protein, ur 100 (A) NEGATIVE mg/dL   Nitrite NEGATIVE NEGATIVE   Leukocytes,Ua NEGATIVE NEGATIVE   RBC / HPF 0-5 0 - 5 RBC/hpf   WBC, UA 0-5 0 - 5 WBC/hpf   Bacteria, UA NONE SEEN NONE SEEN    Comment: Performed at [facility] [address], [place], [place] [address]  Urine rapid drug screen (hosp performed)     Status: None   Collection Time: 11/27/18  8:30 AM  Result Value Ref Range   Opiates NONE DETECTED NONE DETECTED   Cocaine NONE DETECTED NONE DETECTED   Benzodiazepines NONE DETECTED NONE DETECTED   Amphetamines NONE DETECTED NONE DETECTED   Tetrahydrocannabinol NONE DETECTED NONE DETECTED   Barbiturates NONE DETECTED NONE DETECTED    Comment: (NOTE) DRUG SCREEN FOR MEDICAL PURPOSES ONLY.  IF CONFIRMATION IS NEEDED FOR ANY PURPOSE, NOTIFY LAB WITHIN 5 DAYS. LOWEST DETECTABLE LIMITS FOR URINE DRUG SCREEN Drug Class                     Cutoff (ng/mL) Amphetamine and metabolites    1000 Barbiturate and metabolites    200 Benzodiazepine                 [id number] Tricyclics and metabolites     300 Opiates and metabolites        300 Cocaine and metabolites        300 THC                            50 Performed at [facility], [place] [address], [place], [place] [address]   CBG monitoring, ED     Status: Abnormal   Collection Time: 11/27/18 12:49 PM  Result Value Ref Range   Glucose-Capillary 145 (H) 70 - 99 mg/dL   Comment 1 Notify RN    Comment 2 Document in Chart    Dg Chest 2 View  Result Date: 11/27/2018 CLINICAL DATA:  Shortness of breath. EXAM: CHEST - 2 VIEW COMPARISON:  03/29/2018 FINDINGS: The cardio pericardial silhouette is enlarged. Diffuse interstitial opacity noted. Vascular anatomy not well demonstrated raising the question of interstitial edema. Patchy opacity at the bases likely atelectatic although pneumonia not excluded. The  visualized bony structures of the  thorax are intact. IMPRESSION: Diffuse interstitial opacity raising the question of interstitial edema. Patchy opacity at both lung bases likely atelectatic although pneumonia not excluded. Electronically Signed   By: Misty Stanley M.D.   On: 11/27/2018 06:11    Pending Labs Unresulted Labs (From admission, onward)    Start     Ordered   11/28/18 [id number]  Basic metabolic panel  Tomorrow morning,   R     11/27/18 1143   11/28/18 0500  Lipid panel  Tomorrow morning,   R     11/27/18 1143   11/28/18 0500  CBC  Tomorrow morning,   R     11/27/18 1143   11/28/18 0500  Protime-INR  Tomorrow morning,   R     11/27/18 1143   11/27/18 1141  Hemoglobin A1c  Once,   STAT    Comments: To assess prior glycemic control    11/27/18 1143   11/27/18 1141  HIV Antibody  (Routine Testing)  Once,   STAT     11/27/18 1143   11/27/18 1141  TSH  Once,   STAT     11/27/18 1143   11/27/18 0754  SARS CORONAVIRUS 2 (TAT 6-24 HRS) Nasopharyngeal Nasopharyngeal Swab  (Asymptomatic/Tier 2 Patients Labs)  Once,   STAT    Question Answer Comment  Is this test for diagnosis or screening Screening   Symptomatic for COVID-19 as defined by CDC No   Hospitalized for COVID-19 No   Admitted to ICU for COVID-19 No   Previously tested for COVID-19 No   Resident in a congregate (group) care setting No   Employed in healthcare setting No      11/27/18 0754          Vitals/Pain Today's Vitals   11/27/18 1330 11/27/18 1335 11/27/18 1340 11/27/18 1345  BP: 135/83 (!) 142/99 (!) 152/93 (!) 159/112  Pulse:      Resp: (!) 26 (!) 28 (!) 29 (!) 22  Temp:      TempSrc:      SpO2:      PainSc:        Isolation Precautions No active isolations  Medications Medications  amLODipine (NORVASC) tablet 10 mg (10 mg Oral Given 11/27/18 1218)  cloNIDine (CATAPRES) tablet 0.1 mg (0.1 mg Oral Given 11/27/18 1217)  hydrALAZINE (APRESOLINE) tablet 100 mg (100 mg Oral Given 11/27/18 1217)  labetalol (NORMODYNE) tablet 150 mg (has  no administration in time range)  atorvastatin (LIPITOR) tablet 40 mg (has no administration in time range)  insulin aspart protamine- aspart (NOVOLOG MIX 70/30) injection 26 Units (has no administration in time range)  insulin aspart (novoLOG) injection 0-15 Units (2 Units Subcutaneous Given 11/27/18 1350)  enoxaparin (LOVENOX) injection 40 mg (has no administration in time range)  sodium chloride flush (NS) 0.9 % injection 3 mL (has no administration in time range)  0.9 %  sodium chloride infusion (has no administration in time range)  aspirin EC tablet 81 mg (has no administration in time range)  acetaminophen (TYLENOL) tablet 650 mg (has no administration in time range)  ondansetron (ZOFRAN) injection 4 mg (has no administration in time range)  insulin aspart (novoLOG) injection 0-5 Units (has no administration in time range)  nitroGLYCERIN (NITROGLYN) 2 % ointment 1 inch (1 inch Topical Given 11/27/18 0936)  acetaminophen (TYLENOL) tablet 1,000 mg (1,000 mg Oral Given 11/27/18 1108)    Mobility walks     Focused Assessments Cardiac Assessment Handoff:  No results found for: CKTOTAL, CKMB, CKMBINDEX, TROPONINI No results found for: DDIMER Does the Patient currently have chest pain? No      R Recommendations: See Admitting Provider Note  Report given to:   Additional Notes:

## 2018-11-27 NOTE — ED Notes (Signed)
Pt resting comfortably on his phone.  Denies any CP or SOB with rest, no HA.

## 2018-11-27 NOTE — Progress Notes (Signed)
  Echocardiogram 2D Echocardiogram has been performed.  Harold Clements 11/27/2018, 5:20 PM

## 2018-11-27 NOTE — ED Notes (Signed)
Pt CBG 145

## 2018-11-27 NOTE — ED Provider Notes (Signed)
Medical screening examination/treatment/procedure(s) were conducted as a shared visit with non-physician practitioner(s) and myself.  I personally evaluated the patient during the encounter.  EKG Interpretation  Date/Time:  Sunday November 27 2018 04:57:49 EDT Ventricular Rate:  107 PR Interval:    QRS Duration: 94 QT Interval:  342 QTC Calculation: 457 R Axis:   23 Text Interpretation:  Sinus tachycardia Biatrial enlargement no change from previous Confirmed by Charlesetta Shanks [phone]) on 11/27/2018 7:49:00 AM  Angiocath insertion Performed by: Charlesetta Shanks  Consent: Verbal consent obtained. Risks and benefits: risks, benefits and alternatives were discussed Time out: Immediately prior to procedure a "time out" was called to verify the correct patient, procedure, equipment, support staff and site/side marked as required.  Preparation: Patient was prepped and draped in the usual sterile fashion.  Vein Location: left median cubital  Yes Ultrasound Guided  Gauge:20  Positional. Infiltrated. D/Ced  Angiocath insertion Performed by: Charlesetta Shanks  Consent: Verbal consent obtained. Risks and benefits: risks, benefits and alternatives were discussed Time out: Immediately prior to procedure a "time out" was called to verify the correct patient, procedure, equipment, support staff and site/side marked as required.  Preparation: Patient was prepped and draped in the usual sterile fashion.  Vein Location: right basilic  Ys Ultrasound Guided  Gauge: 20 [place]  Normal blood return and flush without difficulty Patient tolerance: Patient tolerated the procedure well with no immediate complications.  Patient has recent history of severe hypertension and hospitalization in [place].  He reports he is taking his medications as prescribed.  He became short of breath over the span of several hours this morning.  He became very short of breath but did not have chest pain.  He denied  headache or visual changes.  No pain in the calves.  Patient reports he is not formally diagnosed with sleep apnea but both he and his wife believes a habit based on sleeping patterns and snoring.  Patient is alert with clear mental status.  Obesity.  No respiratory distress at rest.  Heart regular rhythm.  No gross rub murmur gallop.  Lungs, patient has very thick chest wall cannot appreciate significant crackles on exam.  Abdomen is soft and nontender.  Lower extremities soft nontender without edema.  Mental status is clear.  Movements are coordinated and purposeful.  Patient presented with severe hypertension.  Chest x-ray shows vascular congestion and acute worsening of renal insufficiency.  Findings consistent with hypertensive emergency.  Patient did not have any headache or visual changes or chest pain.  After sublingual nitroglycerin and nitroglycerin paste, patient did begin to complain of some generalized headache but this was not present before initiation of nitroglycerin.      Charlesetta Shanks, MD 11/27/18 1027

## 2018-11-27 NOTE — ED Notes (Signed)
Consulted with Dr. Lorin Mercy regarding Nitro titration and PO meds.  Will wean off Nitro by half until stable.  Pt restful and sleeps through most of titration changes.  Does state he did not sleep through the night.  Considerable snoring and note sleep apnea.  Does wake easily and is able to reposition in bed and take meds as needed.  HA improved without other noted sx.

## 2018-11-28 DIAGNOSIS — E1165 Type 2 diabetes mellitus with hyperglycemia: Secondary | ICD-10-CM | POA: Diagnosis present

## 2018-11-28 DIAGNOSIS — E1122 Type 2 diabetes mellitus with diabetic chronic kidney disease: Secondary | ICD-10-CM

## 2018-11-28 DIAGNOSIS — I5023 Acute on chronic systolic (congestive) heart failure: Secondary | ICD-10-CM | POA: Diagnosis not present

## 2018-11-28 DIAGNOSIS — I160 Hypertensive urgency: Secondary | ICD-10-CM | POA: Diagnosis not present

## 2018-11-28 DIAGNOSIS — I130 Hypertensive heart and chronic kidney disease with heart failure and stage 1 through stage 4 chronic kidney disease, or unspecified chronic kidney disease: Secondary | ICD-10-CM | POA: Diagnosis present

## 2018-11-28 DIAGNOSIS — G4733 Obstructive sleep apnea (adult) (pediatric): Secondary | ICD-10-CM | POA: Diagnosis not present

## 2018-11-28 DIAGNOSIS — I169 Hypertensive crisis, unspecified: Secondary | ICD-10-CM | POA: Diagnosis not present

## 2018-11-28 DIAGNOSIS — I161 Hypertensive emergency: Secondary | ICD-10-CM | POA: Diagnosis not present

## 2018-11-28 DIAGNOSIS — Z841 Family history of disorders of kidney and ureter: Secondary | ICD-10-CM | POA: Diagnosis not present

## 2018-11-28 DIAGNOSIS — Z9119 Patient's noncompliance with other medical treatment and regimen: Secondary | ICD-10-CM | POA: Diagnosis not present

## 2018-11-28 DIAGNOSIS — N183 Chronic kidney disease, stage 3 (moderate): Secondary | ICD-10-CM | POA: Diagnosis not present

## 2018-11-28 DIAGNOSIS — I5041 Acute combined systolic (congestive) and diastolic (congestive) heart failure: Secondary | ICD-10-CM

## 2018-11-28 DIAGNOSIS — N184 Chronic kidney disease, stage 4 (severe): Secondary | ICD-10-CM

## 2018-11-28 DIAGNOSIS — N179 Acute kidney failure, unspecified: Secondary | ICD-10-CM | POA: Diagnosis present

## 2018-11-28 DIAGNOSIS — N182 Chronic kidney disease, stage 2 (mild): Secondary | ICD-10-CM | POA: Diagnosis not present

## 2018-11-28 DIAGNOSIS — Z20828 Contact with and (suspected) exposure to other viral communicable diseases: Secondary | ICD-10-CM | POA: Diagnosis not present

## 2018-11-28 DIAGNOSIS — Z833 Family history of diabetes mellitus: Secondary | ICD-10-CM | POA: Diagnosis not present

## 2018-11-28 DIAGNOSIS — I10 Essential (primary) hypertension: Secondary | ICD-10-CM

## 2018-11-28 DIAGNOSIS — I5043 Acute on chronic combined systolic (congestive) and diastolic (congestive) heart failure: Secondary | ICD-10-CM | POA: Diagnosis not present

## 2018-11-28 DIAGNOSIS — R0602 Shortness of breath: Secondary | ICD-10-CM | POA: Diagnosis not present

## 2018-11-28 DIAGNOSIS — Z8249 Family history of ischemic heart disease and other diseases of the circulatory system: Secondary | ICD-10-CM | POA: Diagnosis not present

## 2018-11-28 DIAGNOSIS — Z7984 Long term (current) use of oral hypoglycemic drugs: Secondary | ICD-10-CM | POA: Diagnosis not present

## 2018-11-28 DIAGNOSIS — Z6837 Body mass index (BMI) 37.0-37.9, adult: Secondary | ICD-10-CM | POA: Diagnosis not present

## 2018-11-28 LAB — PROTIME-INR
INR: 1.2 (ref 0.8–1.2)
Prothrombin Time: 15 seconds (ref 11.4–15.2)

## 2018-11-28 LAB — LIPID PANEL
Cholesterol: 163 mg/dL (ref 0–200)
HDL: 31 mg/dL — ABNORMAL LOW (ref >40)
LDL Cholesterol: 105 mg/dL — ABNORMAL HIGH (ref 0–99)
Total CHOL/HDL Ratio: 5.3 RATIO
Triglycerides: 133 mg/dL (ref <150)
VLDL: 27 mg/dL (ref 0–40)

## 2018-11-28 LAB — CBC
HCT: 44.4 % (ref 39.0–52.0)
Hemoglobin: 15.3 g/dL (ref 13.0–17.0)
MCH: 27.8 pg (ref 26.0–34.0)
MCHC: 34.5 g/dL (ref 30.0–36.0)
MCV: 80.6 fL (ref 80.0–100.0)
Platelets: 229 10*3/uL (ref 150–400)
RBC: 5.51 MIL/uL (ref 4.22–5.81)
RDW: 13.4 % (ref 11.5–15.5)
WBC: 6 10*3/uL (ref 4.0–10.5)
nRBC: 0 % (ref 0.0–0.2)

## 2018-11-28 LAB — BASIC METABOLIC PANEL
Anion gap: 12 (ref 5–15)
BUN: 23 mg/dL — ABNORMAL HIGH (ref 6–20)
CO2: 22 mmol/L (ref 22–32)
Calcium: 9.3 mg/dL (ref 8.9–10.3)
Chloride: 103 mmol/L (ref 98–111)
Creatinine, Ser: 2.31 mg/dL — ABNORMAL HIGH (ref 0.61–1.24)
GFR calc Af Amer: 41 mL/min — ABNORMAL LOW (ref >60)
GFR calc non Af Amer: 35 mL/min — ABNORMAL LOW (ref >60)
Glucose, Bld: 143 mg/dL — ABNORMAL HIGH (ref 70–99)
Potassium: 3.5 mmol/L (ref 3.5–5.1)
Sodium: 137 mmol/L (ref 135–145)

## 2018-11-28 LAB — GLUCOSE, CAPILLARY
Glucose-Capillary: 155 mg/dL — ABNORMAL HIGH (ref 70–99)
Glucose-Capillary: 116 mg/dL — ABNORMAL HIGH (ref 70–99)
Glucose-Capillary: 84 mg/dL (ref 70–99)
Glucose-Capillary: 120 mg/dL — ABNORMAL HIGH (ref 70–99)

## 2018-11-28 LAB — HIV ANTIBODY (ROUTINE TESTING W REFLEX): HIV Screen 4th Generation wRfx: NONREACTIVE

## 2018-11-28 MED ORDER — MINOXIDIL 10 MG PO TABS
10.0000 mg | ORAL_TABLET | Freq: Every day | ORAL | Status: DC
  Administered 2018-11-28 – 2018-11-30 (×3): 10 mg via ORAL
  Filled 2018-11-28 (×3): qty 1

## 2018-11-28 MED ORDER — CARVEDILOL 12.5 MG PO TABS
12.5000 mg | ORAL_TABLET | Freq: Two times a day (BID) | ORAL | Status: DC
  Administered 2018-11-28 – 2018-11-30 (×4): 12.5 mg via ORAL
  Filled 2018-11-28 (×4): qty 1

## 2018-11-28 MED ORDER — FUROSEMIDE 10 MG/ML IJ SOLN
60.0000 mg | Freq: Once | INTRAMUSCULAR | Status: AC
  Administered 2018-11-28: 60 mg via INTRAVENOUS
  Filled 2018-11-28: qty 6

## 2018-11-28 MED ORDER — LABETALOL HCL 200 MG PO TABS: 300.0000 mg | ORAL_TABLET | Freq: Two times a day (BID) | ORAL | Status: DC

## 2018-11-28 MED ORDER — FUROSEMIDE 40 MG PO TABS
40.0000 mg | ORAL_TABLET | Freq: Every day | ORAL | Status: DC
  Filled 2018-11-28: qty 1

## 2018-11-28 NOTE — Consult Note (Addendum)
Cardiology Consultation:   Patient ID: Harold Clements; [id number]; Oct 15, 1980   Admit date: 11/27/2018 Date of Consult: 11/28/2018  Primary Care Provider: Patient, No Pcp Per Primary Cardiologist: New to [facility]; Dr. Sallyanne Kuster Primary Electrophysiologist:  None    Patient Profile:   Harold Clements is a 38 y.o. male with a PMH of HTN with multiple hospitalization for hypertensive urgency, chronic diastolic CHF, DM type 2, CKD stage 3, OSA, morbid obesity, and medication noncompliance, who is being seen today for the evaluation of poorly controlled HTN at the request of Dr. Loleta Books.  History of Present Illness:   Harold Clements initially presented with acute onset SOB at 3am on 9/27. He reported a similar experience several months ago when he was admitted to the hospital in [place] with poorly controlled HTN and CHF. He denied any associated chest pain, palpitations, LE edema, orthopnea, PND, dizziness, lightheadedness, or syncope at that time.    He does not follow with a cardiologist in [place]. He had an echocardiogram during his admission in [place], however these results were not available. No prior ischemic evaluation. Renal artery ultrasound in 2018 without RAS. He reports mother had a history of CHF, HTN, DM, and ESRD on HD who passed away at 51, and he has a brother with HTN, DM, and ESRD on HD in his 45s. 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 Unfortunately he does not have prescription drug coverage.   At the time of this evaluation he reports resolution of his SOB. He reports missing 2 doses of medications per week when he has to be at work early. He does not have a blood pressure cuff at home to monitor his BP.   His BP continues to be difficult to control. He presented with a BP 255/180 mmHg which has overall improved but still markedly elevated. He has a history of non-compliance and renal insufficiency which complicates management. Echo  this admission showed EF 45-50%, moderate concentric LVH, elevated mean LA pressures, G2DD, moderate biatrial enlargement, trivial circumferential pericardial effusion, and no significant valvular abnormalities. His Cr is 2.3, up from baseline 1.7.  His HTN regimen this admission includes amlodipine, hydralazine, and labetalol. Clonidine was discontinued this morning to avoid rebound hypertension with noncompliance. Cardiology was asked to evaluate for new onset combined CHF and HTN management recommendations.   Past Medical History:  Diagnosis Date  . 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 Diabetes mellitus without complication ([place])   . Ear pain 07/2017  . Hypertension   . Wasp sting 08/04/2016   of dorsum of left hand near thumb    History reviewed. No pertinent surgical history.   Home Medications:  Prior to Admission medications   Medication Sig Start Date End Date Taking? Authorizing Provider  cloNIDine (CATAPRES) 0.1 MG tablet Take 0.1 mg by mouth 2 (two) times daily.   Yes [provider]  furosemide (LASIX) 40 MG tablet Take 20 mg by mouth daily.   Yes [provider]  hydrALAZINE (APRESOLINE) 50 MG tablet Take 100 mg by mouth 2 (two) times daily.   Yes [provider]  Insulin Isophane & Regular Human (NOVOLIN 70/30 FLEXPEN) (70-30) 100 UNIT/ML PEN Inject 26 Units into the skin 2 (two) times daily.   Yes [provider]  labetalol (NORMODYNE) 100 MG tablet Take 150 mg by mouth 2 (two) times daily.   Yes [provider]  potassium chloride (KLOR-CON)  20 MEQ packet Take 20 mEq by mouth daily.   Yes [provider]  amLODipine (NORVASC) 10 MG tablet Take 1 tablet (10 mg total) by mouth daily. Patient not taking: Reported on 11/27/2018 08/14/17   Molt, Bethany, DO  atorvastatin (LIPITOR) 40 MG tablet Take 1 tablet (40 mg total) by mouth daily at 6 PM. Patient not taking: Reported on 11/27/2018 08/17/17   Lenore Cordia, MD  canagliflozin [facility]) 100 MG TABS tablet Take 1 tablet (100 mg total) by mouth daily before breakfast. Patient not taking: Reported on 11/27/2018 08/17/17   Lenore Cordia, MD  metFORMIN (GLUCOPHAGE) 1000 MG tablet Take 1 tablet (1,000 mg total) by mouth 2 (two) times daily with a meal. IM program. Patient not taking: Reported on 11/27/2018 08/13/17 03/29/18  Molt, Bethany, DO  spironolactone (ALDACTONE) 50 MG tablet Take 1 tablet (50 mg total) by mouth daily. IM Program Patient not taking: Reported on 11/27/2018 08/17/17 03/29/18  Lenore Cordia, MD    Inpatient Medications: Scheduled Meds: . amLODipine  10 mg Oral Daily  . aspirin EC  81 mg Oral Daily  . atorvastatin  40 mg Oral q1800  . enoxaparin (LOVENOX) injection  40 mg Subcutaneous Q24H  . hydrALAZINE  100 mg Oral BID  . insulin aspart  0-15 Units Subcutaneous TID WC  . insulin aspart  0-5 Units Subcutaneous QHS  . insulin aspart protamine- aspart  26 Units Subcutaneous BID WC  . labetalol  300 mg Oral BID  . sodium chloride flush  3 mL Intravenous Q12H   Continuous Infusions: . sodium chloride     PRN Meds: sodium chloride, acetaminophen, HYDROcodone-acetaminophen, ondansetron (ZOFRAN) IV, sodium chloride flush  Allergies:    Allergies  Allergen Reactions  . Zithromax [Azithromycin Dihydrate] Anaphylaxis  . Kiwi Extract Other (See Comments)    Tongue goes numb    Social History:   Social History   Socioeconomic History  . Marital status: Married    Spouse name: Not on file  . Number of children: Not on file  . Years of education: Not on file  . Highest education level: Not on file  Occupational History  . Occupation: Engineer, agricultural  Social Needs  . Financial resource strain: Not on file  . Food insecurity    Worry: Not on file    Inability: Not on file  . Transportation needs    Medical: Not on file    Non-medical: Not on file  Tobacco Use  . Smoking status: Former Smoker    Years:  4.00    Types: Cigars    Quit date: 2010    Years since quitting: 10.7  . Smokeless tobacco: Never Used  Substance and Sexual Activity  . Alcohol use: No  . Drug use: No  . Sexual activity: Yes  Lifestyle  . Physical activity    Days per week: Not on file    Minutes per session: Not on file  . Stress: Not on file  Relationships  . Social Herbalist on phone: Not on file    Gets together: Not on file    Attends religious service: Not on file    Active member of club or organization: Not on file    Attends meetings of clubs or organizations: Not on file    Relationship status: Not on file  . Intimate partner violence    Fear of current or ex partner: Not on file    Emotionally abused:  Not on file    Physically abused: Not on file    Forced sexual activity: Not on file  Other Topics Concern  . Not on file  Social History Narrative  . Not on file    Family History:    Family History  Problem Relation Age of Onset  . Hypertension Mother   . Diabetes Mother   . Heart disease Mother   . Hypertension Father      ROS:  Please see the history of present illness.   All other ROS reviewed and negative.     Physical Exam/Data:   Vitals:   11/27/18 2127 11/28/18 0258 11/28/18 0824 11/28/18 0936  BP: (!) 185/136 (!) 171/118 (!) 198/146 (!) 150/93  Pulse:  92    Resp:  16    Temp:  98.3 F (36.8 C)    TempSrc:  Oral    SpO2:  98%    Weight:  117.6 kg    Height:        Intake/Output Summary (Last 24 hours) at 11/28/2018 1316 Last data filed at 11/27/2018 2000 Gross per 24 hour  Intake 304.34 ml  Output -  Net 304.34 ml   Filed Weights   11/27/18 1529 11/28/18 0258  Weight: 119 kg 117.6 kg   Body mass index is 37.19 kg/m.  General:  Well nourished, well developed, laying in bed in no acute distress HEENT: sclera anicteric  Neck: no JVD Vascular: distal pulses 2+ bilaterally Cardiac:  normal S1, S2; RRR; no murmurs, rubs, or gallops Lungs:  clear  to auscultation bilaterally, no wheezing, rhonchi or rales  Abd: NABS, soft, obese, nontender, no hepatomegaly Ext: no edema Musculoskeletal:  No deformities, BUE and BLE strength normal and equal Skin: warm and dry  Neuro:  CNs 2-12 intact, no focal abnormalities noted Psych:  Normal affect   EKG:  The EKG was personally reviewed and demonstrates:  Sinus tachycardia with rate 107, borderline LVH, biatrial enlargement, no STE/D, no TWI Telemetry:  Telemetry was personally reviewed and demonstrates:  NSR  Relevant CV Studies: 1. Left ventricular ejection fraction, by visual estimation, is 45 to 50%. The left ventricle has mildly decreased function. Moderately increased left ventricular size. There is moderate left concentric ventricular hypertrophy.  2. Elevated mean left atrial pressure.  3. Left ventricular diastolic Doppler parameters are consistent with pseudonormalization pattern of LV diastolic filling.  4. Global right ventricle has normal systolic function.The right ventricular size is mildly enlarged. No increase in right ventricular wall thickness.  5. Left atrial size was moderately dilated.  6. Right atrial size was moderately dilated.  7. The pericardial effusion is circumferential.  8. Trivial pericardial effusion is present.  9. The mitral valve is normal in structure. No evidence of mitral valve regurgitation. 10. The tricuspid valve is normal in structure. Tricuspid valve regurgitation was not visualized by color flow Doppler. 11. The aortic valve is normal in structure. Aortic valve regurgitation was not visualized by color flow Doppler. 12. The pulmonic valve was normal in structure. Pulmonic valve regurgitation is not visualized by color flow Doppler. 13. TR signal is inadequate for assessing pulmonary artery systolic pressure. 14. The inferior vena cava is dilated in size with >50% respiratory variability, suggesting right atrial pressure of 8 mmHg  Laboratory Data:   Chemistry Recent Labs  Lab 11/27/18 0504 11/28/18 0952  NA 138 137  K 3.8 3.5  CL 105 103  CO2 21* 22  GLUCOSE 212* 143*  BUN 23* 23*  CREATININE 2.31* 2.31*  CALCIUM 9.3 9.3  GFRNONAA 35* 35*  GFRAA 41* 41*  ANIONGAP 12 12    No results for input(s): PROT, ALBUMIN, AST, ALT, ALKPHOS, BILITOT in the last 168 hours. Hematology Recent Labs  Lab 11/27/18 0504 11/28/18 0952  WBC 6.2 6.0  RBC 5.39 5.51  HGB 15.0 15.3  HCT 44.0 44.4  MCV 81.6 80.6  MCH 27.8 27.8  MCHC 34.1 34.5  RDW 13.4 13.4  PLT 213 229   Cardiac EnzymesNo results for input(s): TROPONINI in the last 168 hours. No results for input(s): TROPIPOC in the last 168 hours.  BNP Recent Labs  Lab 11/27/18 0504  BNP 402.3*    DDimer No results for input(s): DDIMER in the last 168 hours.  Radiology/Studies:  Dg Chest 2 View  Result Date: 11/27/2018 CLINICAL DATA:  Shortness of breath. EXAM: CHEST - 2 VIEW COMPARISON:  03/29/2018 FINDINGS: The cardio pericardial silhouette is enlarged. Diffuse interstitial opacity noted. Vascular anatomy not well demonstrated raising the question of interstitial edema. Patchy opacity at the bases likely atelectatic although pneumonia not excluded. The visualized bony structures of the thorax are intact. IMPRESSION: Diffuse interstitial opacity raising the question of interstitial edema. Patchy opacity at both lung bases likely atelectatic although pneumonia not excluded. Electronically Signed   By: Misty Stanley M.D.   On: 11/27/2018 06:11    Assessment and Plan:   1. Malignant HTN: BP has been difficulty to control in patient with non-compliance and renal insufficiency (Cr 2.3 this admission; up from baseline 1.7). His clonidine was discontinued this admission in an effort to avoid rebound hypertension. He is currently on amlodipine, hydralazine, and labetalol.  - Will transition from labetalol to carvedilol  - Will stop hydralazine and start minoxidil  - Plan to resume po  lasix 40mg  daily tomorrow - Continue amlodipine - Could consider clonidine patch if BP is persistently elevated  2. Chronic combined CHF: CXR with interstitial edema. EF 45-50% on echo this admission. Likely this is related to his HTN. He appears euvolemic on exam today after receiving IV lasix this morning.  - Will transition from labetalol to carvedilol - Will resume po lasix 40mg  daily tomorrow - Unable to add ACEi/ARB/spironolactone due to renal insufficiency. - Plan to repeat echo in 3 months to evaluate for improvement in EF. If BP well controlled and LV systolic dysfunction persists, could consider an ischemic evaluation at that time.   3. DM type 2: poorly controlled; A1C 8.1 this admission. Likely unable to afford SGLT2-I, though would be a good candidate given CHF diagnosis.  - Continue management per primary team  4. OSA: intermittently compliant with his CPAP.  - Continue to encourage CPAP compliance  5. CKD stage 3: Cr 2.3 this admission; up from baseline 1.7.  - Continue aggressive HTN and DM management above - Continue to monitor routinely with medication titration   For questions or updates, please contact [place] Please consult [email or web address] for contact info under Cardiology/STEMI.   Signed, Abigail Butts, PA-C  11/28/2018 1:16 PM [phone]  I have seen and examined the patient along with Abigail Butts, PA-C .  I have reviewed the chart, notes and new data.  I agree with PA's note.  Key new complaints: able to lie fully flat without dyspnea. Older brother 45 on HD; mom had CHF died 42. Key examination changes: no edema, clear lungs, no S3 Key new findings / data: creatinine stable since yesterday at 2.3 (baseline previously 1.7-1.9), LVEF  45%. No renal artery stenosis recent US.  PLAN: Malignant HTN with rapid progression of cardiac and renal problems. 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 Can expect rebound w  clonidine.  Minoxidil 10 mg daily, instead of hydralazine. Carvedilol instead of labetalol. Start 12.5 mg BID and titrate up to 25 mg BID. Continue amlodipine and switch furosemide to PO. Consider clonidine patch.   Sanda Klein, MD, [place] [phone] 11/28/2018, 3:07 PM

## 2018-11-28 NOTE — Progress Notes (Signed)
PROGRESS NOTE    Harold Clements  [id number] DOB: [birth year] DOA: 11/27/2018 PCP: Patient, No Pcp Per      Brief Narrative:  Harold Clements is a 38 y.o. 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  Patient was recently admitted in [facility] for identical symptoms (awoke at night with dyspnea), diagnosed with atypical pneumonia, records not available to me but [facility] PCP establishment note from 3 months ago states "suspected for COVID-19".  At that time, BP meds changed from amlodipine, HCTZ, lisinopril, spironolactone to clonidine, hydralazine, and labetalol.  Echo at that time reportedly with EF 55-60%.  He reports Lasix added and his symptoms resolved.  Since then, patient completely normal, able to work (works for [employer], so active) without orthopnea or swelling or PND at all until the day of admission here, woke suddenly at [employer], out of breath. Stood by the [facility] unit trying to catch his breath, called EMS.  On arrival to ER, BP 256/179, CXR showed interstitial markings, BNP elevated, Cr 2.3 up from 1.7.  Started on nitro drip and improved.      Assessment & Plan:  Hypertensive emergency Patient presented with BP 255/180 mmHg and paroxysmal nocturnal dysnpea, edema on CXR and elevated BNP.  He has improved substantially overnight with BP reduction by 25%.  BP 170s, 180s overnight  Had renal duplex in 2018 normal.  Had (afternoon) renin/aldosterone testing in 2018 that was unremarkable.  -Continue amlodipine, hydralazine -Titrate up beta-blocker -Restart diuretic -Stop clonidine to avoid rebound hypertension in this patient with questionable adherence -Consult to Cardiology for recommendations re: spironolactone or ACEi in setting of renal failure   Acute on chronic systolic and diastolic CHF New onset systolic dysfunction Diagnosed with CHF this spring in [facility] (presented with HTN urgency, resolved  with Lasix), started on Lasix, only given ~30 day supply, so not taking it since.  No Lasix given here on admission.  No labs available this morning despite being ordered -Furosemide 60 mg IV once now and reassess urine output and renal function -K normal now -Strict I/Os, daily weights, telemetry  -Daily monitoring renal function -Consult Cardiology, appreciate expert guidance re: new onset LVSD and difficult to control BP  -Continue BB, hydralazine -Holding ACEi given renal function   AKI on CKD IV Baseline Cr 1.7 as far as we know.  Has been referred to Nephrology, this is still pending.  -Trend Cr  Diabetes Glucoses well controlled -Continue 70/30 -Continue SSI corrections -Continue aspirni, atorvastatin  Obesity Recommended weight loss       MDM and disposition: The below labs and imaging reports were reviewed and summarized above.  Medication management as above.  Outside records from [place] where reviewed and syummarized above.  The patient was admitted with new onset LV systolic dysfunction, hypertensive emergency, renal dysfunction.    At this time, continued inpatient services are reasonable and expected, given the patient's: Severity of presentation: BP 256/179 mmHg, worsening creatinine (2.3 from baseline 1.7), new onset LV systolic dysfunction and evidence of CHF, and the high likelihood of an adverse outcome, including progression of renal failure and requiring ESRD if the patient were to be discharged prematurely.       DVT prophylaxis: Lovenox Code Status: FULL Family Communication: Wife by phone    Consultants:   Cardiology  Procedures:   Echocardiogram 9/27 IMPRESSIONS    1. Left ventricular ejection fraction, by visual estimation, is  45 to 50%. The left ventricle has mildly decreased function. Moderately increased left ventricular size. There is moderate left concentric ventricular hypertrophy.  2. Elevated mean left atrial  pressure.  3. Left ventricular diastolic Doppler parameters are consistent with pseudonormalization pattern of LV diastolic filling.  4. Global right ventricle has normal systolic function.The right ventricular size is mildly enlarged. No increase in right ventricular wall thickness.  5. Left atrial size was moderately dilated.  6. Right atrial size was moderately dilated.  7. The pericardial effusion is circumferential.  8. Trivial pericardial effusion is present.  9. The mitral valve is normal in structure. No evidence of mitral valve regurgitation. 10. The tricuspid valve is normal in structure. Tricuspid valve regurgitation was not visualized by color flow Doppler. 11. The aortic valve is normal in structure. Aortic valve regurgitation was not visualized by color flow Doppler. 12. The pulmonic valve was normal in structure. Pulmonic valve regurgitation is not visualized by color flow Doppler. 13. TR signal is inadequate for assessing pulmonary artery systolic pressure. 14. The inferior vena cava is dilated in size with >50% respiratory variability, suggesting right atrial pressure of 8 mmHg.       Subjective: Feeling better.  No orthopnea or swelling overnight.  No confusion, weakness, focal weakness, seizures, chest pain.  Objective: Vitals:   11/27/18 1529 11/27/18 1936 11/27/18 2127 11/28/18 0258  BP: (!) 184/114 (!) 186/125 (!) 185/136 (!) 171/118  Pulse: 85 88  92  Resp: 18 16  16   Temp: 98.3 F (36.8 C) 98.5 F (36.9 C)  98.3 F (36.8 C)  TempSrc: Oral Oral  Oral  SpO2: 100% 100%  98%  Weight: 119 kg   117.6 kg  Height: 5\' 10"  (1.778 m)       Intake/Output Summary (Last 24 hours) at 11/28/2018 [id number] Last data filed at 11/27/2018 2000 Gross per 24 hour  Intake 304.34 ml  Output --  Net 304.34 ml   Filed Weights   11/27/18 1529 11/28/18 0258  Weight: 119 kg 117.6 kg    Examination: General appearance: obese adult male, alert and in no acute distress.   HEENT:  Anicteric, conjunctiva pink, lids and lashes normal. No nasal deformity, discharge, epistaxis.  Lips moist, dentition normal, OP moist, no oral lesions, hearing normal.   Skin: Warm and dry.   No suspicious rashes or lesions. Cardiac: RRR, nl S1-S2, no murmurs appreciated.  Capillary refill is brisk.  No JVD.  No LE edema.  Radial pulses 2+ and symmetric. Respiratory: Normal respiratory rate and rhythm.  CTAB without rales or wheezes. Abdomen: Abdomen soft.  No TTP. No ascites, distension, hepatosplenomegaly.   MSK: No deformities or effusions. Neuro: Awake and alert.  EOMI, moves all extremities. Speech fluent.    Psych: Sensorium intact and responding to questions, attention normal. Affect normal.  Judgment and insight appear normal.    Data Reviewed: I have personally reviewed following labs and imaging studies:  CBC: Recent Labs  Lab 11/27/18 0504  WBC 6.2  HGB 15.0  HCT 44.0  MCV 81.6  PLT 123456   Basic Metabolic Panel: Recent Labs  Lab 11/27/18 0504  NA 138  K 3.8  CL 105  CO2 21*  GLUCOSE 212*  BUN 23*  CREATININE 2.31*  CALCIUM 9.3   GFR: Estimated Creatinine Clearance: 57.3 mL/min (A) (by C-G formula based on SCr of 2.31 mg/dL (H)). Liver Function Tests: No results for input(s): AST, ALT, ALKPHOS, BILITOT, PROT, ALBUMIN in the last 168 hours. No  results for input(s): LIPASE, AMYLASE in the last 168 hours. No results for input(s): AMMONIA in the last 168 hours. Coagulation Profile: No results for input(s): INR, PROTIME in the last 168 hours. Cardiac Enzymes: No results for input(s): CKTOTAL, CKMB, CKMBINDEX, TROPONINI in the last 168 hours. BNP (last 3 results) No results for input(s): PROBNP in the last 8760 hours. HbA1C: Recent Labs    11/27/18 1616  HGBA1C 8.1*   CBG: Recent Labs  Lab 11/27/18 1249 11/27/18 1632 11/27/18 2114 11/28/18 0751  GLUCAP 145* 129* 169* 155*   Lipid Profile: No results for input(s): CHOL, HDL, LDLCALC, TRIG, CHOLHDL,  LDLDIRECT in the last 72 hours. Thyroid Function Tests: Recent Labs    11/27/18 1616  TSH 3.112   Anemia Panel: No results for input(s): VITAMINB12, FOLATE, FERRITIN, TIBC, IRON, RETICCTPCT in the last 72 hours. Urine analysis:    Component Value Date/Time   COLORURINE COLORLESS (A) 11/27/2018 0508   APPEARANCEUR CLEAR 11/27/2018 0508   LABSPEC 1.003 (L) 11/27/2018 0508   PHURINE 6.0 11/27/2018 0508   GLUCOSEU >=500 (A) 11/27/2018 0508   HGBUR SMALL (A) 11/27/2018 0508   BILIRUBINUR NEGATIVE 11/27/2018 0508   KETONESUR NEGATIVE 11/27/2018 0508   PROTEINUR 100 (A) 11/27/2018 0508   UROBILINOGEN 1.0 08/12/2014 1200   NITRITE NEGATIVE 11/27/2018 0508   LEUKOCYTESUR NEGATIVE 11/27/2018 0508   Sepsis Labs: @LABRCNTIP (procalcitonin:4,lacticacidven:4)  ) Recent Results (from the past 240 hour(s))  SARS CORONAVIRUS 2 (TAT 6-24 HRS) Nasopharyngeal Nasopharyngeal Swab     Status: None   Collection Time: 11/27/18  7:54 AM   Specimen: Nasopharyngeal Swab  Result Value Ref Range Status   SARS Coronavirus 2 NEGATIVE NEGATIVE Final    Comment: (NOTE) SARS-CoV-2 target nucleic acids are NOT DETECTED. The SARS-CoV-2 RNA is generally detectable in upper and lower respiratory specimens during the acute phase of infection. Negative results do not preclude SARS-CoV-2 infection, do not rule out co-infections with other pathogens, and should not be used as the sole basis for treatment or other patient management decisions. Negative results must be combined with clinical observations, patient history, and epidemiological information. The expected result is Negative. Fact Sheet for Patients: SugarRoll.be Fact Sheet for Healthcare Providers: https://www.woods-mathews.com/ This test is not yet approved or cleared by the [place] FDA and  has been authorized for detection and/or diagnosis of SARS-CoV-2 by FDA under an Emergency Use Authorization  (EUA). This EUA will remain  in effect (meaning this test can be used) for the duration of the COVID-19 declaration under Section 56 4(b)(1) of the Act, 21 U.S.C. section 360bbb-3(b)(1), unless the authorization is terminated or revoked sooner. Performed at [facility], [place] [address], [place], [place] [address]          Radiology Studies: Dg Chest 2 View  Result Date: 11/27/2018 CLINICAL DATA:  Shortness of breath. EXAM: CHEST - 2 VIEW COMPARISON:  03/29/2018 FINDINGS: The cardio pericardial silhouette is enlarged. Diffuse interstitial opacity noted. Vascular anatomy not well demonstrated raising the question of interstitial edema. Patchy opacity at the bases likely atelectatic although pneumonia not excluded. The visualized bony structures of the thorax are intact. IMPRESSION: Diffuse interstitial opacity raising the question of interstitial edema. Patchy opacity at both lung bases likely atelectatic although pneumonia not excluded. Electronically Signed   By: Misty Stanley M.D.   On: 11/27/2018 06:11        Scheduled Meds:  amLODipine  10 mg Oral Daily   aspirin EC  81 mg Oral Daily  atorvastatin  40 mg Oral q1800   cloNIDine  0.1 mg Oral BID   enoxaparin (LOVENOX) injection  40 mg Subcutaneous Q24H   hydrALAZINE  100 mg Oral BID   insulin aspart  0-15 Units Subcutaneous TID WC   insulin aspart  0-5 Units Subcutaneous QHS   insulin aspart protamine- aspart  26 Units Subcutaneous BID WC   labetalol  150 mg Oral BID   sodium chloride flush  3 mL Intravenous Q12H   Continuous Infusions:  sodium chloride       LOS: 0 days    Time spent: 35 minutes    Edwin Dada, MD [facility] 11/28/2018, 8:08 AM     Please page through [place]:  [email or web address] Password [email or web address] If 7PM-7AM, please contact night-coverage

## 2018-11-29 DIAGNOSIS — N183 Chronic kidney disease, stage 3 (moderate): Secondary | ICD-10-CM

## 2018-11-29 DIAGNOSIS — G4733 Obstructive sleep apnea (adult) (pediatric): Secondary | ICD-10-CM

## 2018-11-29 DIAGNOSIS — I5043 Acute on chronic combined systolic (congestive) and diastolic (congestive) heart failure: Secondary | ICD-10-CM

## 2018-11-29 LAB — CBC
HCT: 48.7 % (ref 39.0–52.0)
Hemoglobin: 16.5 g/dL (ref 13.0–17.0)
MCH: 27.4 pg (ref 26.0–34.0)
MCHC: 33.9 g/dL (ref 30.0–36.0)
MCV: 80.9 fL (ref 80.0–100.0)
Platelets: 256 10*3/uL (ref 150–400)
RBC: 6.02 MIL/uL — ABNORMAL HIGH (ref 4.22–5.81)
RDW: 13.3 % (ref 11.5–15.5)
WBC: 6.4 10*3/uL (ref 4.0–10.5)
nRBC: 0 % (ref 0.0–0.2)

## 2018-11-29 LAB — BASIC METABOLIC PANEL
Anion gap: 13 (ref 5–15)
BUN: 33 mg/dL — ABNORMAL HIGH (ref 6–20)
CO2: 23 mmol/L (ref 22–32)
Calcium: 9.4 mg/dL (ref 8.9–10.3)
Chloride: 101 mmol/L (ref 98–111)
Creatinine, Ser: 2.57 mg/dL — ABNORMAL HIGH (ref 0.61–1.24)
GFR calc Af Amer: 36 mL/min — ABNORMAL LOW (ref >60)
GFR calc non Af Amer: 31 mL/min — ABNORMAL LOW (ref >60)
Glucose, Bld: 112 mg/dL — ABNORMAL HIGH (ref 70–99)
Potassium: 3.4 mmol/L — ABNORMAL LOW (ref 3.5–5.1)
Sodium: 137 mmol/L (ref 135–145)

## 2018-11-29 LAB — GLUCOSE, CAPILLARY
Glucose-Capillary: 121 mg/dL — ABNORMAL HIGH (ref 70–99)
Glucose-Capillary: 218 mg/dL — ABNORMAL HIGH (ref 70–99)
Glucose-Capillary: 117 mg/dL — ABNORMAL HIGH (ref 70–99)
Glucose-Capillary: 90 mg/dL (ref 70–99)

## 2018-11-29 MED ORDER — PNEUMOCOCCAL VAC POLYVALENT 25 MCG/0.5ML IJ INJ
0.5000 mL | INJECTION | INTRAMUSCULAR | Status: DC
  Filled 2018-11-29: qty 0.5

## 2018-11-29 MED ORDER — FUROSEMIDE 40 MG PO TABS
40.0000 mg | ORAL_TABLET | Freq: Every day | ORAL | Status: DC
  Administered 2018-11-30: 10:00:00 40 mg via ORAL
  Filled 2018-11-29: qty 1

## 2018-11-29 MED ORDER — DOCUSATE SODIUM 100 MG PO CAPS
100.0000 mg | ORAL_CAPSULE | Freq: Every day | ORAL | Status: DC
  Administered 2018-11-29 – 2018-11-30 (×2): 100 mg via ORAL
  Filled 2018-11-29 (×2): qty 1

## 2018-11-29 MED ORDER — POTASSIUM CHLORIDE CRYS ER 20 MEQ PO TBCR
60.0000 meq | EXTENDED_RELEASE_TABLET | Freq: Once | ORAL | Status: AC
  Administered 2018-11-29: 60 meq via ORAL
  Filled 2018-11-29: qty 3

## 2018-11-29 MED ORDER — POLYETHYLENE GLYCOL 3350 17 G PO PACK: 17.0000 g | PACK | Freq: Every day | ORAL | Status: DC | PRN

## 2018-11-29 MED ORDER — ALUM & MAG HYDROXIDE-SIMETH 200-200-20 MG/5ML PO SUSP
30.0000 mL | Freq: Four times a day (QID) | ORAL | Status: DC | PRN
  Administered 2018-11-29: 30 mL via ORAL
  Filled 2018-11-29: qty 30

## 2018-11-29 NOTE — Progress Notes (Signed)
PROGRESS NOTE    Harold Clements  [id number] DOB: [birth year] DOA: 11/27/2018 PCP: Patient, No Pcp Per   Brief Narrative: As per H & P: 38 y.o. 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 Patient was recently admitted in [facility] for identical symptoms (awoke at night with dyspnea), diagnosed with atypical pneumonia, records not available to me but [facility] PCP establishment note from 3 months ago states "suspected for COVID-19".  At that time, BP meds changed from amlodipine, HCTZ, lisinopril, spironolactone to clonidine, hydralazine, and labetalol. Echo at that time reportedly with EF 55-60%.  He reports Lasix added and his symptoms resolved. Since then, patient completely normal, able to work (works for [employer], so active) without orthopnea or swelling or PND at all until the day of admission here, woke suddenly at [employer], out of breath. Stood by the [facility] unit trying to catch his breath, called EMS.  On arrival to ER, BP 256/179, CXR showed interstitial markings, BNP elevated, Cr 2.3 up from 1.7.  Started on nitro drip and improved. Patient was admitted for further management.   Cardiology was consulted: Medication being adjusted  Subjective: C/o headache  And constipation On RA, sitting up on bed No acute events overnight.  Blood pressure last spike was 170/126 at 4 PM yesterday since then 130s to 100 until this morning and now at 160 DBp still high in 102  Assessment & Plan:   Hypertensive emergency/malignant hypertension: BP has been difficult to control with noncompliance and renal insufficiency.  Off clonidine this admission to avoid rebound hypertension.  Appreciate cardiology input labetalol switched to carvedilol, hydralazine switched to minoxidil.  Lasix resumed at 40 daily.  Also on amlodipine. BP   Markedly reduced in last 24 hours low 100 this morning, will need to monitor next 24 hours to make sure blood  pressure has stabilized avoid rapid drop. apprecoaate cardio inputs   Acute on chronic combined  systolic and diastolic CHF with new onset systolic dysfunction: Chest x-ray showed interstitial edema EF of 45 to 50% on echo this admission is likely secondary to uncontrolled hypertension.  Overall stable after IV Lasix.  Switch to p.o. per cardiology 40 Lasix daily.  Continue beta-blocker as above, unable to use ACE inhibitors or ARBS or Aldactone due to his renal dysfunction  AKI on CKD stage IV baseline creatinine around 1.7.  CKD  Likely from hypertension as well as diabetes.  Creatinine at 2.3 since admissions, slightly worse at 2.5.  Monitor. Recent Labs  Lab 11/27/18 0504 11/28/18 0952 11/29/18 0405  BUN 23* 23* 33*  CREATININE 2.31* 2.31* 2.57*   Diabetes: Blood sugar fairly stable last A1c 8.1ON 9/27.  Cont ssi and insulin 7030  Recent Labs  Lab 11/27/18 2114 11/28/18 0751 11/28/18 1111 11/28/18 1612 11/28/18 2146  GLUCAP 169* 155* 116* 84 120*   Obesity, w BMI Body mass index is 37.33 kg/m.  Weight loss and healthy lifestyle advsied. OSA intermittently compliant with CPAP.  Encouraged to use.  DVT prophylaxis:lovenox Code Status:  Family Communication: plan of care discussed with patient in detail. Will update family Disposition Plan: Remains inpatient pending  Stabilization is blood pressure.  Monitor additional night, discharge once okay with cardiology.  Consultants:  CARDIOLOGY Procedures: TTE  Left ventricular ejection fraction, by visual estimation, is 45 to 50%. The left ventricle has mildly decreased function. Moderately increased left ventricular size. There is moderate left concentric ventricular hypertrophy.  2. Elevated mean left atrial pressure. 3. Left ventricular diastolic Doppler parameters are consistent with pseudonormalization pattern of LV diastolic filling. 4. Global right ventricle has normal systolic function.The right ventricular size is  mildly enlarged. No increase in right ventricular wall thickness. 5. Left atrial size was moderately dilated. 6. Right atrial size was moderately dilated. 7. The pericardial effusion is circumferential. 8. Trivial pericardial effusion is present. 9. The mitral valve is normal in structure. No evidence of mitral valve regurgitation. 10. The tricuspid valve is normal in structure. Tricuspid valve regurgitation was not visualized by color flow Doppler. 11. The aortic valve is normal in structure. Aortic valve regurgitation was not visualized by color flow Doppler. 12. The pulmonic valve was normal in structure. Pulmonic valve regurgitation is not visualized by color flow Doppler. 13. TR signal is inadequate for assessing pulmonary artery systolic pressure. 14. The inferior vena cava is dilated in size with >50% respiratory variability, suggesting right atrial pressure of 8 mmHg.   Microbiology: None  Antimicrobials: Anti-infectives (From admission, onward)   None       Objective: Vitals:   11/28/18 1841 11/28/18 2143 11/29/18 0658 11/29/18 0706  BP: 135/75 136/86  106/64  Pulse:  81  87  Resp:  18  18  Temp:  97.9 F (36.6 C)  97.6 F (36.4 C)  TempSrc:  Oral  Oral  SpO2:  99%  98%  Weight:   118 kg   Height:        Intake/Output Summary (Last 24 hours) at 11/29/2018 0727 Last data filed at 11/29/2018 0612 Gross per 24 hour  Intake 1024 ml  Output 650 ml  Net 374 ml   Filed Weights   11/27/18 1529 11/28/18 0258 11/29/18 0658  Weight: 119 kg 117.6 kg 118 kg   Weight change: -0.979 kg  Body mass index is 37.33 kg/m.  Intake/Output from previous day: 09/28 0701 - 09/29 0700 In: 1024 [P.O.:1024] Out: 650 [Urine:650] Intake/Output this shift: No intake/output data recorded.  Examination:  General exam: Appears calm and comfortable,Not in distress, 38 years old older fore the age HEENT:PERRL,Oral mucosa moist, Ear/Nose normal on gross exam Respiratory system: Bilateral  equal air entry, normal vesicular breath sounds, no wheezes or crackles  Cardiovascular system: S1 & S2 heard,No JVD, murmurs. Gastrointestinal system: Abdomen is  soft, non tender, non distended, BS +  Nervous System:Alert and oriented. No focal neurological deficits/moving extremities, sensation intact. Extremities: No edema, no clubbing, distal peripheral pulses palpable. Skin: No rashes, lesions, no icterus MSK: Normal muscle bulk,tone ,power  Medications:  Scheduled Meds: . amLODipine  10 mg Oral Daily  . aspirin EC  81 mg Oral Daily  . atorvastatin  40 mg Oral q1800  . carvedilol  12.5 mg Oral BID WC  . enoxaparin (LOVENOX) injection  40 mg Subcutaneous Q24H  . furosemide  40 mg Oral Daily  . insulin aspart  0-15 Units Subcutaneous TID WC  . insulin aspart  0-5 Units Subcutaneous QHS  . insulin aspart protamine- aspart  26 Units Subcutaneous BID WC  . minoxidil  10 mg Oral Daily  . sodium chloride flush  3 mL Intravenous Q12H   Continuous Infusions: . sodium chloride      Data Reviewed: I have personally reviewed following labs and imaging studies  CBC: Recent Labs  Lab 11/27/18 0504 11/28/18 0952 11/29/18 0405  WBC 6.2 6.0 6.4  HGB 15.0 15.3 16.5  HCT 44.0 44.4 48.7  MCV 81.6 80.6 80.9  PLT 213 229 256  Basic Metabolic Panel: Recent Labs  Lab 11/27/18 0504 11/28/18 0952 11/29/18 0405  NA 138 137 137  K 3.8 3.5 3.4*  CL 105 103 101  CO2 21* 22 23  GLUCOSE 212* 143* 112*  BUN 23* 23* 33*  CREATININE 2.31* 2.31* 2.57*  CALCIUM 9.3 9.3 9.4   GFR: Estimated Creatinine Clearance: 51.6 mL/min (A) (by C-G formula based on SCr of 2.57 mg/dL (H)). Liver Function Tests: No results for input(s): AST, ALT, ALKPHOS, BILITOT, PROT, ALBUMIN in the last 168 hours. No results for input(s): LIPASE, AMYLASE in the last 168 hours. No results for input(s): AMMONIA in the last 168 hours. Coagulation Profile: Recent Labs  Lab 11/28/18 0952  INR 1.2   Cardiac  Enzymes: No results for input(s): CKTOTAL, CKMB, CKMBINDEX, TROPONINI in the last 168 hours. BNP (last 3 results) No results for input(s): PROBNP in the last 8760 hours. HbA1C: Recent Labs    11/27/18 1616  HGBA1C 8.1*   CBG: Recent Labs  Lab 11/27/18 2114 11/28/18 0751 11/28/18 1111 11/28/18 1612 11/28/18 2146  GLUCAP 169* 155* 116* 84 120*   Lipid Profile: Recent Labs    11/28/18 0952  CHOL 163  HDL 31*  LDLCALC 105*  TRIG 133  CHOLHDL 5.3   Thyroid Function Tests: Recent Labs    11/27/18 1616  TSH 3.112   Anemia Panel: No results for input(s): VITAMINB12, FOLATE, FERRITIN, TIBC, IRON, RETICCTPCT in the last 72 hours. Sepsis Labs: No results for input(s): PROCALCITON, LATICACIDVEN in the last 168 hours.  Recent Results (from the past 240 hour(s))  SARS CORONAVIRUS 2 (TAT 6-24 HRS) Nasopharyngeal Nasopharyngeal Swab     Status: None   Collection Time: 11/27/18  7:54 AM   Specimen: Nasopharyngeal Swab  Result Value Ref Range Status   SARS Coronavirus 2 NEGATIVE NEGATIVE Final    Comment: (NOTE) SARS-CoV-2 target nucleic acids are NOT DETECTED. The SARS-CoV-2 RNA is generally detectable in upper and lower respiratory specimens during the acute phase of infection. Negative results do not preclude SARS-CoV-2 infection, do not rule out co-infections with other pathogens, and should not be used as the sole basis for treatment or other patient management decisions. Negative results must be combined with clinical observations, patient history, and epidemiological information. The expected result is Negative. Fact Sheet for Patients: SugarRoll.be Fact Sheet for Healthcare Providers: https://www.woods-mathews.com/ This test is not yet approved or cleared by the [place] FDA and  has been authorized for detection and/or diagnosis of SARS-CoV-2 by FDA under an Emergency Use Authorization (EUA). This EUA will remain  in  effect (meaning this test can be used) for the duration of the COVID-19 declaration under Section 56 4(b)(1) of the Act, 21 U.S.C. section 360bbb-3(b)(1), unless the authorization is terminated or revoked sooner. Performed at [facility], [place] [address], [place], [place] [address]       Radiology Studies: No results found.    LOS: 1 day   Time spent: More than 50% of that time was spent in counseling and/or coordination of care.  Antonieta Pert, MD [facility]  11/29/2018, 7:27 AM

## 2018-11-29 NOTE — Plan of Care (Signed)
  Problem: Elimination: Goal: Will not experience complications related to bowel motility Outcome: Completed/Met   Problem: Pain Managment: Goal: General experience of comfort will improve Outcome: Completed/Met   Problem: Skin Integrity: Goal: Risk for impaired skin integrity will decrease Outcome: Completed/Met

## 2018-11-29 NOTE — Progress Notes (Signed)
Progress Note  Patient Name: Harold Clements Date of Encounter: 11/29/2018  Primary Cardiologist: New (Ceola Para)  Subjective   He has a mild headache but denies dyspnea, dizziness or lightheadedness when walking. Blood pressure was in normal range overnight, now with elevated systolic and diastolic blood pressure again, albeit much better than yesterday. Slight increase in BUN and creatinine  Inpatient Medications    Scheduled Meds: . amLODipine  10 mg Oral Daily  . aspirin EC  81 mg Oral Daily  . atorvastatin  40 mg Oral q1800  . carvedilol  12.5 mg Oral BID WC  . docusate sodium  100 mg Oral Daily  . enoxaparin (LOVENOX) injection  40 mg Subcutaneous Q24H  . furosemide  40 mg Oral Daily  . insulin aspart  0-15 Units Subcutaneous TID WC  . insulin aspart  0-5 Units Subcutaneous QHS  . insulin aspart protamine- aspart  26 Units Subcutaneous BID WC  . minoxidil  10 mg Oral Daily  . sodium chloride flush  3 mL Intravenous Q12H   Continuous Infusions: . sodium chloride     PRN Meds: sodium chloride, acetaminophen, HYDROcodone-acetaminophen, ondansetron (ZOFRAN) IV, polyethylene glycol, sodium chloride flush   Vital Signs    Vitals:   11/28/18 2143 11/29/18 0658 11/29/18 0706 11/29/18 0800  BP: 136/86  106/64 (!) 161/102  Pulse: 81  87 85  Resp: 18  18   Temp: 97.9 F (36.6 C)  97.6 F (36.4 C)   TempSrc: Oral  Oral   SpO2: 99%  98%   Weight:  118 kg    Height:        Intake/Output Summary (Last 24 hours) at 11/29/2018 0901 Last data filed at 11/29/2018 0612 Gross per 24 hour  Intake 1024 ml  Output 650 ml  Net 374 ml   Last 3 Weights 11/29/2018 11/28/2018 11/27/2018  Weight (lbs) 260 lb 2.3 oz 259 lb 3.2 oz 262 lb 4.8 oz  Weight (kg) 118 kg 117.572 kg 118.978 kg      Telemetry    Sinus rhythm- Personally Reviewed  ECG    No new tracing- Personally Reviewed  Physical Exam  Obese.  Comfortable lying fully flat in bed GEN: No acute distress.   Neck: No  JVD Cardiac: RRR, no murmurs, rubs, or gallop is present Respiratory: Clear to auscultation bilaterally. GI: Soft, nontender, non-distended  MS: No edema; No deformity. Neuro:  Nonfocal  Psych: Normal affect   Labs    High Sensitivity Troponin:  No results for input(s): TROPONINIHS in the last 720 hours.    Chemistry Recent Labs  Lab 11/27/18 0504 11/28/18 0952 11/29/18 0405  NA 138 137 137  K 3.8 3.5 3.4*  CL 105 103 101  CO2 21* 22 23  GLUCOSE 212* 143* 112*  BUN 23* 23* 33*  CREATININE 2.31* 2.31* 2.57*  CALCIUM 9.3 9.3 9.4  GFRNONAA 35* 35* 31*  GFRAA 41* 41* 36*  ANIONGAP 12 12 13      Hematology Recent Labs  Lab 11/27/18 0504 11/28/18 0952 11/29/18 0405  WBC 6.2 6.0 6.4  RBC 5.39 5.51 6.02*  HGB 15.0 15.3 16.5  HCT 44.0 44.4 48.7  MCV 81.6 80.6 80.9  MCH 27.8 27.8 27.4  MCHC 34.1 34.5 33.9  RDW 13.4 13.4 13.3  PLT 213 229 256    BNP Recent Labs  Lab 11/27/18 0504  BNP 402.3*     DDimer No results for input(s): DDIMER in the last 168 hours.   Radiology  No results found.  Cardiac Studies   Echocardiogram 11/27/2018 1. Left ventricular ejection fraction, by visual estimation, is 45 to 50%. The left ventricle has mildly decreased function. Moderately increased left ventricular size. There is moderate left concentric ventricular hypertrophy.  2. Elevated mean left atrial pressure.  3. Left ventricular diastolic Doppler parameters are consistent with pseudonormalization pattern of LV diastolic filling.  4. Global right ventricle has normal systolic function.The right ventricular size is mildly enlarged. No increase in right ventricular wall thickness.  5. Left atrial size was moderately dilated.  6. Right atrial size was moderately dilated.  7. The pericardial effusion is circumferential.  8. Trivial pericardial effusion is present.  9. The mitral valve is normal in structure. No evidence of mitral valve regurgitation. 10. The tricuspid valve  is normal in structure. Tricuspid valve regurgitation was not visualized by color flow Doppler. 11. The aortic valve is normal in structure. Aortic valve regurgitation was not visualized by color flow Doppler. 12. The pulmonic valve was normal in structure. Pulmonic valve regurgitation is not visualized by color flow Doppler. 13. TR signal is inadequate for assessing pulmonary artery systolic pressure. 14. The inferior vena cava is dilated in size with >50% respiratory variability, suggesting right atrial pressure of 8 mmHg.   Patient Profile     37 y.o. male with malignant hypertension, acute on chronic diastolic heart failure now with mildly reduced left ventricular systolic function, CKD stage III, OSA, morbid obesity  Assessment & Plan    1. CHF: Clinically appears to be euvolemic.  No evidence of edema or jugular venous distention, clear lungs.  I am very hopeful that there will be improvement in left ventricular systolic function with sustained blood pressure control. 2. HTN: Clearly with malignant course with endorgan involvement.  Has first-degree family members with premature death due to heart failure and end-stage renal disease on hemodialysis.  Marked reduction in blood pressure over last 24 hours, monitor over next day.  If necessary there is room to increase his carvedilol since he has not had any problems with bradycardia. 3. CKD 3: Slight uptick in BUN and creatinine needs to be watched carefully.  I think we will should skip his diuretic today and start administering oral diuretics tomorrow. 4. OSA: Consistent treatment with CPAP and weight loss would assist with blood pressure control.     For questions or updates, please contact [place] Please consult [email or web address] for contact info under        Signed, Sanda Klein, MD  11/29/2018, 9:01 AM

## 2018-11-30 ENCOUNTER — Other Ambulatory Visit: Payer: Self-pay | Admitting: Medical

## 2018-11-30 ENCOUNTER — Telehealth: Payer: Self-pay | Admitting: Cardiovascular Disease

## 2018-11-30 DIAGNOSIS — N182 Chronic kidney disease, stage 2 (mild): Secondary | ICD-10-CM

## 2018-11-30 DIAGNOSIS — N183 Chronic kidney disease, stage 3 unspecified: Secondary | ICD-10-CM

## 2018-11-30 LAB — BASIC METABOLIC PANEL
Anion gap: 9 (ref 5–15)
BUN: 32 mg/dL — ABNORMAL HIGH (ref 6–20)
CO2: 24 mmol/L (ref 22–32)
Calcium: 8.9 mg/dL (ref 8.9–10.3)
Chloride: 103 mmol/L (ref 98–111)
Creatinine, Ser: 2.4 mg/dL — ABNORMAL HIGH (ref 0.61–1.24)
GFR calc Af Amer: 39 mL/min — ABNORMAL LOW (ref >60)
GFR calc non Af Amer: 34 mL/min — ABNORMAL LOW (ref >60)
Glucose, Bld: 87 mg/dL (ref 70–99)
Potassium: 3.8 mmol/L (ref 3.5–5.1)
Sodium: 136 mmol/L (ref 135–145)

## 2018-11-30 LAB — GLUCOSE, CAPILLARY: Glucose-Capillary: 87 mg/dL (ref 70–99)

## 2018-11-30 MED ORDER — CARVEDILOL 12.5 MG PO TABS: 12.5000 mg | ORAL_TABLET | Freq: Two times a day (BID) | ORAL | 0 refills | Status: DC

## 2018-11-30 MED ORDER — POTASSIUM CHLORIDE 20 MEQ PO PACK: 20.0000 meq | PACK | Freq: Every day | ORAL | 0 refills | Status: DC

## 2018-11-30 MED ORDER — ATORVASTATIN CALCIUM 40 MG PO TABS: 40.0000 mg | ORAL_TABLET | Freq: Every day | ORAL | 0 refills | Status: DC

## 2018-11-30 MED ORDER — POTASSIUM CHLORIDE CRYS ER 20 MEQ PO TBCR
60.0000 meq | EXTENDED_RELEASE_TABLET | Freq: Once | ORAL | Status: AC
  Administered 2018-11-30: 60 meq via ORAL
  Filled 2018-11-30: qty 3

## 2018-11-30 MED ORDER — ASPIRIN 81 MG PO TBEC: 81.0000 mg | DELAYED_RELEASE_TABLET | Freq: Every day | ORAL | 0 refills | Status: AC

## 2018-11-30 MED ORDER — AMLODIPINE BESYLATE 10 MG PO TABS: 10.0000 mg | ORAL_TABLET | Freq: Every day | ORAL | 0 refills | Status: DC

## 2018-11-30 MED ORDER — POTASSIUM CHLORIDE CRYS ER 20 MEQ PO TBCR: 20.0000 meq | EXTENDED_RELEASE_TABLET | Freq: Every day | ORAL | Status: DC

## 2018-11-30 MED ORDER — MINOXIDIL 10 MG PO TABS: 10.0000 mg | ORAL_TABLET | Freq: Every day | ORAL | 0 refills | Status: DC

## 2018-11-30 MED ORDER — FUROSEMIDE 40 MG PO TABS: 40.0000 mg | ORAL_TABLET | Freq: Every day | ORAL | 0 refills | Status: DC

## 2018-11-30 NOTE — Telephone Encounter (Signed)
The pts f/u is scheduled at our [facility] office. Will forward to their [facility] pool.

## 2018-11-30 NOTE — Telephone Encounter (Signed)
New Message    Patient has TOC visit for 12/13/18 @815  with Fabian Sharp.

## 2018-11-30 NOTE — Discharge Summary (Signed)
Physician Discharge Summary  Harold Clements [id number] DOB: [birth year] DOA: 11/27/2018  PCP: Patient, No Pcp Per  Admit date: 11/27/2018 Discharge date: 11/30/2018  Admitted From: home Disposition:  home  Recommendations for Outpatient Follow-up:  1. Follow up with PCP in 1-2 weeks 2. Please obtain BMP/CBC in one week 3. Please follow up on the following pending results:  Home Health:no  Equipment/Devices:none  Discharge Condition: Stable CODE STATUS: FULL Diet recommendation: Heart Healthy, diabetic, 1200 m fluid restriction  Brief/Interim Summary:  As per H & P: 38 y.o.Mwith HTN, DM,dCHF,morbid obesity and CKD III-IVbaseline Cr 1.7 who presented with sudden SOB at 3AM. Patient was [facility] in [facility] foridentical symptoms (awoke at night with dyspnea), diagnosed withatypical pneumonia, records not available to me but [facility] PCP establishment note from 3 months ago states "suspected for COVID-19". At that time, BP meds changed from amlodipine, HCTZ, lisinopril, spironolactone to clonidine, hydralazine, and labetalol. Echo at that time reportedly with EF 55-60%. He reports Lasix added and his symptoms resolved.Since then, patient completely normal, able to work (works for [employer], so active) without orthopnea or swelling or PND at all untilthe day of admission here,woke suddenly at3AM, out of breath. Stood by the [facility] unit trying to catch his breath, called EMS. On arrivalto ER, BP 256/179, CXR showed interstitial markings, BNP elevated, Cr 2.3 up from 1.7. Started on nitro drip and improved.  Patient was admitted for further management.   ardiology was consulted: Medication being adjusted BP improved w med change. Doing well, no cp/sob. He is being discharged Home today in medically stable condition with medication adjustment  Discharge Diagnoses:  Principal Problem:   Hypertensive crisis Active Problems:   Diabetes ([place])   CKD (chronic  kidney disease) stage 2, GFR 60-89 ml/min   Obesity, Class III, BMI 40-49.9 (morbid obesity) ([place])   Hypertensive emergency   Assessment & Plan:   Hypertensive emergency/malignant hypertension: BP has been difficult to control with noncompliance and renal insufficiency.  Off clonidine this admission to avoid rebound hypertension.  Appreciate cardiology input labetalol switched to carvedilol, hydralazine switched to minoxidil.  Lasix resumed at 40 daily.  Also on amlodipine. BP  blood pressure stable.  Seen by cardiology and okay for discharge.  Will go home today  on amlodipine, carvedilol, minoxidil and Lasix Acute on chronic combined  systolic and diastolic CHF with new onset systolic dysfunction: Chest x-ray showed interstitial edema EF of 45 to 50% on echo this admission is likely secondary to uncontrolled hypertension.  Overall stable after IV Lasix.  Switch to p.o. per cardiology 40 Lasix daily.  Continue beta-blocker as above, unable to use ACE inhibitors or ARBS or Aldactone due to his renal dysfunction.  patient will need outpatient echocardiogram and chemistry panel, which will be arranged by cardiology  AKI on CKD stage IV baseline creatinine around 1.7.  CKD  Likely from hypertension as well as diabetes.  Creatinine at 2.3 since admissions, slightly worse at 2.5.  Monitor. Recent Labs  Lab 11/27/18 0504 11/28/18 0952 11/29/18 0405 11/30/18 0538  BUN 23* 23* 33* 32*  CREATININE 2.31* 2.31* 2.57* 2.40*    Diabetes: Blood sugar fairly stable last A1c 8.1ON 9/27.  Cont ssi and insulin 7030 Obesity, w BMI Body mass index is 37.33 kg/m.  Weight loss and healthy lifestyle advsied. OSA intermittently compliant with CPAP.  Encouraged to use.  DVT prophylaxis:lovenox Code Status: full Family Communication: plan of care discussed with patient in detail. Updated family Disposition Plan: d/c  hoem today if okay w cardio  Consultants:  CARDIOLOGY Procedures: TTE  Left  ventricular ejection fraction, by visual estimation, is 45 to 50%. The left ventricle has mildly decreased function. Moderately increased left ventricular size. There is moderate left concentric ventricular hypertrophy. 2. Elevated mean left atrial pressure. 3. Left ventricular diastolic Doppler parameters are consistent with pseudonormalization pattern of LV diastolic filling. 4. Global right ventricle has normal systolic function.The right ventricular size is mildly enlarged. No increase in right ventricular wall thickness. 5. Left atrial size was moderately dilated. 6. Right atrial size was moderately dilated. 7. The pericardial effusion is circumferential. 8. Trivial pericardial effusion is present. 9. The mitral valve is normal in structure. No evidence of mitral valve regurgitation. 10. The tricuspid valve is normal in structure. Tricuspid valve regurgitation was not visualized by color flow Doppler. 11. The aortic valve is normal in structure. Aortic valve regurgitation was not visualized by color flow Doppler. 12. The pulmonic valve was normal in structure. Pulmonic valve regurgitation is not visualized by color flow Doppler. 13. TR signal is inadequate for assessing pulmonary artery systolic pressure. 14. The inferior vena cava is dilated in s    Discharge Instructions  Amlodipine 10 mg once daily, carvedilol 12.5 mg twice daily, furosemide 40 mg once daily, potassium chloride 20 mEq once daily, minoxidil 10 mg once daily, Other recommendations (labs, testing, etc): Echocardiogram in 3 months.  Basic metabolic panel in 2 weeks. Follow up as an outpatient: We will arrange transition of care visit in 2 weeks Discharge Instructions    Diet - low sodium heart healthy   Complete by: As directed    Discharge instructions   Complete by: As directed    Please call call MD or return to ER for similar or worsening recurring problem that brought you to hospital or if any  fever,nausea/vomiting,abdominal pain, uncontrolled pain, chest pain,  shortness of breath or any other alarming symptoms.  Please follow-up your doctor as instructed in a week time and call the office for appointment.  Please avoid alcohol, smoking, or any other illicit substance and maintain healthy habits including taking your regular medications as prescribed.  You were cared for by a hospitalist during your hospital stay. If you have any questions about your discharge medications or the care you received while you were in the hospital after you are discharged, you can call the unit and ask to speak with the hospitalist on call if the hospitalist that took care of you is not available.  Once you are discharged, your primary care physician will handle any further medical issues. Please note that NO REFILLS for any discharge medications will be authorized once you are discharged, as it is imperative that you return to your primary care physician (or establish a relationship with a primary care physician if you do not have one) for your aftercare needs so that they can reassess your need for medications and monitor your lab values  continune Amlodipine 10 mg once daily, carvedilol 12.5 mg twice daily, furosemide 40 mg once daily, potassium chloride 20 mEq once daily, minoxidil 10 mg once daily, Other recommendations (labs, testing, etc): Echocardiogram in 3 months.  Basic metabolic panel in 2 weeks. Follow up as an outpatient: We will arrange transition of care visit in 2 weeks   Increase activity slowly   Complete by: As directed      Allergies as of 11/30/2018      Reactions   Zithromax [azithromycin Dihydrate] Anaphylaxis  Kiwi Extract Other (See Comments)   Tongue goes numb      Medication List    STOP taking these medications   cloNIDine 0.1 MG tablet Commonly known as: CATAPRES   hydrALAZINE 50 MG tablet Commonly known as: APRESOLINE   labetalol 100 MG tablet Commonly known  as: NORMODYNE   spironolactone 50 MG tablet Commonly known as: Aldactone     TAKE these medications   amLODipine 10 MG tablet Commonly known as: NORVASC Take 1 tablet (10 mg total) by mouth daily.   aspirin 81 MG EC tablet Take 1 tablet (81 mg total) by mouth daily. Start taking on: December 01, 2018   atorvastatin 40 MG tablet Commonly known as: LIPITOR Take 1 tablet (40 mg total) by mouth daily at 6 PM.   canagliflozin 100 MG Tabs tablet Commonly known as: INVOKANA Take 1 tablet (100 mg total) by mouth daily before breakfast.   carvedilol 12.5 MG tablet Commonly known as: COREG Take 1 tablet (12.5 mg total) by mouth 2 (two) times daily with a meal.   furosemide 40 MG tablet Commonly known as: LASIX Take 1 tablet (40 mg total) by mouth daily. Start taking on: December 01, 2018 What changed: how much to take   metFORMIN 1000 MG tablet Commonly known as: GLUCOPHAGE Take 1 tablet (1,000 mg total) by mouth 2 (two) times daily with a meal. IM program.   minoxidil 10 MG tablet Commonly known as: LONITEN Take 1 tablet (10 mg total) by mouth daily. Start taking on: December 01, 2018   NovoLIN 70/30 FlexPen (70-30) 100 UNIT/ML PEN Generic drug: Insulin Isophane & Regular Human Inject 26 Units into the skin 2 (two) times daily.   potassium chloride 20 MEQ packet Commonly known as: KLOR-CON Take 20 mEq by mouth daily.       Allergies  Allergen Reactions  . Zithromax [Azithromycin Dihydrate] Anaphylaxis  . Kiwi Extract Other (See Comments)    Tongue goes numb    Consultations:  cardio  Procedures/Studies: Dg Chest 2 View  Result Date: 11/27/2018 CLINICAL DATA:  Shortness of breath. EXAM: CHEST - 2 VIEW COMPARISON:  03/29/2018 FINDINGS: The cardio pericardial silhouette is enlarged. Diffuse interstitial opacity noted. Vascular anatomy not well demonstrated raising the question of interstitial edema. Patchy opacity at the bases likely atelectatic although pneumonia not  excluded. The visualized bony structures of the thorax are intact. IMPRESSION: Diffuse interstitial opacity raising the question of interstitial edema. Patchy opacity at both lung bases likely atelectatic although pneumonia not excluded. Electronically Signed   By: Misty Stanley M.D.   On: 11/27/2018 06:11   Subjective: Doing well, no cp,sob,fever  Discharge Exam: Vitals:   11/30/18 0645 11/30/18 0954  BP: 130/70 (!) 178/104  Pulse: 66   Resp: 20   Temp: 97.7 F (36.5 C)   SpO2: 100%    Vitals:   11/29/18 1658 11/29/18 2125 11/30/18 0645 11/30/18 0954  BP: (!) 167/105 119/73 130/70 (!) 178/104  Pulse:  69 66   Resp:  17 20   Temp:  97.9 F (36.6 C) 97.7 F (36.5 C)   TempSrc:  Oral Oral   SpO2:  96% 100%   Weight:   118.5 kg   Height:        General: Pt is alert, awake, not in acute distress Cardiovascular: RRR, S1/S2 +, no rubs, no gallops Respiratory: CTA bilaterally, no wheezing, no rhonchi Abdominal: Soft, NT, ND, bowel sounds + Extremities: no edema, no cyanosis   The results  of significant diagnostics from this hospitalization (including imaging, microbiology, ancillary and laboratory) are listed below for reference.     Microbiology: Recent Results (from the past 240 hour(s))  SARS CORONAVIRUS 2 (TAT 6-24 HRS) Nasopharyngeal Nasopharyngeal Swab     Status: None   Collection Time: 11/27/18  7:54 AM   Specimen: Nasopharyngeal Swab  Result Value Ref Range Status   SARS Coronavirus 2 NEGATIVE NEGATIVE Final    Comment: (NOTE) SARS-CoV-2 target nucleic acids are NOT DETECTED. The SARS-CoV-2 RNA is generally detectable in upper and lower respiratory specimens during the acute phase of infection. Negative results do not preclude SARS-CoV-2 infection, do not rule out co-infections with other pathogens, and should not be used as the sole basis for treatment or other patient management decisions. Negative results must be combined with clinical observations, patient  history, and epidemiological information. The expected result is Negative. Fact Sheet for Patients: SugarRoll.be Fact Sheet for Healthcare Providers: https://www.woods-mathews.com/ This test is not yet approved or cleared by the [place] FDA and  has been authorized for detection and/or diagnosis of SARS-CoV-2 by FDA under an Emergency Use Authorization (EUA). This EUA will remain  in effect (meaning this test can be used) for the duration of the COVID-19 declaration under Section 56 4(b)(1) of the Act, 21 U.S.C. section 360bbb-3(b)(1), unless the authorization is terminated or revoked sooner. Performed at [facility], [place] [address], [place], [place] [address]      Labs: BNP (last 3 results) Recent Labs    11/27/18 0504  BNP 123XX123*   Basic Metabolic Panel: Recent Labs  Lab 11/27/18 0504 11/28/18 0952 11/29/18 0405 11/30/18 0538  NA 138 137 137 136  K 3.8 3.5 3.4* 3.8  CL 105 103 101 103  CO2 21* 22 23 24   GLUCOSE 212* 143* 112* 87  BUN 23* 23* 33* 32*  CREATININE 2.31* 2.31* 2.57* 2.40*  CALCIUM 9.3 9.3 9.4 8.9   Liver Function Tests: No results for input(s): AST, ALT, ALKPHOS, BILITOT, PROT, ALBUMIN in the last 168 hours. No results for input(s): LIPASE, AMYLASE in the last 168 hours. No results for input(s): AMMONIA in the last 168 hours. CBC: Recent Labs  Lab 11/27/18 0504 11/28/18 0952 11/29/18 0405  WBC 6.2 6.0 6.4  HGB 15.0 15.3 16.5  HCT 44.0 44.4 48.7  MCV 81.6 80.6 80.9  PLT 213 229 256   Cardiac Enzymes: No results for input(s): CKTOTAL, CKMB, CKMBINDEX, TROPONINI in the last 168 hours. BNP: Invalid input(s): POCBNP CBG: Recent Labs  Lab 11/29/18 0749 11/29/18 1118 11/29/18 1659 11/29/18 2129 11/30/18 0753  GLUCAP 121* 218* 117* 90 87   D-Dimer No results for input(s): DDIMER in the last 72 hours. Hgb A1c Recent Labs    11/27/18 1616  HGBA1C 8.1*   Lipid Profile Recent Labs     11/28/18 0952  CHOL 163  HDL 31*  LDLCALC 105*  TRIG 133  CHOLHDL 5.3   Thyroid function studies Recent Labs    11/27/18 1616  TSH 3.112   Anemia work up No results for input(s): VITAMINB12, FOLATE, FERRITIN, TIBC, IRON, RETICCTPCT in the last 72 hours. Urinalysis    Component Value Date/Time   COLORURINE COLORLESS (A) 11/27/2018 0508   APPEARANCEUR CLEAR 11/27/2018 0508   LABSPEC 1.003 (L) 11/27/2018 0508   PHURINE 6.0 11/27/2018 0508   GLUCOSEU >=500 (A) 11/27/2018 0508   HGBUR SMALL (A) 11/27/2018 0508   BILIRUBINUR NEGATIVE 11/27/2018 0508   KETONESUR NEGATIVE 11/27/2018 [id number]  PROTEINUR 100 (A) 11/27/2018 0508   UROBILINOGEN 1.0 08/12/2014 1200   NITRITE NEGATIVE 11/27/2018 0508   LEUKOCYTESUR NEGATIVE 11/27/2018 0508   Sepsis Labs Invalid input(s): PROCALCITONIN,  WBC,  LACTICIDVEN Microbiology Recent Results (from the past 240 hour(s))  SARS CORONAVIRUS 2 (TAT 6-24 HRS) Nasopharyngeal Nasopharyngeal Swab     Status: None   Collection Time: 11/27/18  7:54 AM   Specimen: Nasopharyngeal Swab  Result Value Ref Range Status   SARS Coronavirus 2 NEGATIVE NEGATIVE Final    Comment: (NOTE) SARS-CoV-2 target nucleic acids are NOT DETECTED. The SARS-CoV-2 RNA is generally detectable in upper and lower respiratory specimens during the acute phase of infection. Negative results do not preclude SARS-CoV-2 infection, do not rule out co-infections with other pathogens, and should not be used as the sole basis for treatment or other patient management decisions. Negative results must be combined with clinical observations, patient history, and epidemiological information. The expected result is Negative. Fact Sheet for Patients: SugarRoll.be Fact Sheet for Healthcare Providers: https://www.woods-mathews.com/ This test is not yet approved or cleared by the [place] FDA and  has been authorized for detection and/or  diagnosis of SARS-CoV-2 by FDA under an Emergency Use Authorization (EUA). This EUA will remain  in effect (meaning this test can be used) for the duration of the COVID-19 declaration under Section 56 4(b)(1) of the Act, 21 U.S.C. section 360bbb-3(b)(1), unless the authorization is terminated or revoked sooner. Performed at [facility], [place] [address], [place], [place] [address]      Time coordinating discharge: 25 minutes  SIGNED:   Antonieta Pert, MD  [facility] 11/30/2018, 10:02 AM  If 7PM-7AM, please contact night-coverage [email or web address]

## 2018-11-30 NOTE — Progress Notes (Signed)
Progress Note  Patient Name: Harold Clements Date of Encounter: 11/30/2018  Primary Cardiologist: Deliana Avalos  Subjective   Feels well. Headache resolved. No dyspnea.  Inpatient Medications    Scheduled Meds: . amLODipine  10 mg Oral Daily  . aspirin EC  81 mg Oral Daily  . atorvastatin  40 mg Oral q1800  . carvedilol  12.5 mg Oral BID WC  . docusate sodium  100 mg Oral Daily  . enoxaparin (LOVENOX) injection  40 mg Subcutaneous Q24H  . furosemide  40 mg Oral Daily  . insulin aspart  0-15 Units Subcutaneous TID WC  . insulin aspart  0-5 Units Subcutaneous QHS  . insulin aspart protamine- aspart  26 Units Subcutaneous BID WC  . minoxidil  10 mg Oral Daily  . pneumococcal 23 valent vaccine  0.5 mL Intramuscular Tomorrow-1000  . [START ON 12/01/2018] potassium chloride  20 mEq Oral Daily  . potassium chloride  60 mEq Oral Once  . sodium chloride flush  3 mL Intravenous Q12H   Continuous Infusions: . sodium chloride     PRN Meds: sodium chloride, acetaminophen, alum & mag hydroxide-simeth, HYDROcodone-acetaminophen, ondansetron (ZOFRAN) IV, polyethylene glycol, sodium chloride flush   Vital Signs    Vitals:   11/29/18 1300 11/29/18 1658 11/29/18 2125 11/30/18 0645  BP: 124/79 (!) 167/105 119/73 130/70  Pulse: 77  69 66  Resp: 19  17 20   Temp: 98 F (36.7 C)  97.9 F (36.6 C) 97.7 F (36.5 C)  TempSrc: Oral  Oral Oral  SpO2:   96% 100%  Weight:    118.5 kg  Height:        Intake/Output Summary (Last 24 hours) at 11/30/2018 0906 Last data filed at 11/30/2018 0437 Gross per 24 hour  Intake 960 ml  Output 1550 ml  Net -590 ml   Last 3 Weights 11/30/2018 11/29/2018 11/28/2018  Weight (lbs) 261 lb 3.9 oz 260 lb 2.3 oz 259 lb 3.2 oz  Weight (kg) 118.5 kg 118 kg 117.572 kg      Telemetry    NSR, rare PVCs, a single 3 beat NSVT - Personally Reviewed  ECG    No new tracing - Personally Reviewed  Physical Exam  Obese GEN: No acute distress.   Neck: No JVD Cardiac:  RRR, no murmurs or rubs, S4 gallop present.  Respiratory: Clear to auscultation bilaterally. GI: Soft, nontender, non-distended  MS: No edema; No deformity. Neuro:  Nonfocal  Psych: Normal affect   Labs    High Sensitivity Troponin:  No results for input(s): TROPONINIHS in the last 720 hours.    Chemistry Recent Labs  Lab 11/28/18 0952 11/29/18 0405 11/30/18 0538  NA 137 137 136  K 3.5 3.4* 3.8  CL 103 101 103  CO2 22 23 24   GLUCOSE 143* 112* 87  BUN 23* 33* 32*  CREATININE 2.31* 2.57* 2.40*  CALCIUM 9.3 9.4 8.9  GFRNONAA 35* 31* 34*  GFRAA 41* 36* 39*  ANIONGAP 12 13 9      Hematology Recent Labs  Lab 11/27/18 0504 11/28/18 0952 11/29/18 0405  WBC 6.2 6.0 6.4  RBC 5.39 5.51 6.02*  HGB 15.0 15.3 16.5  HCT 44.0 44.4 48.7  MCV 81.6 80.6 80.9  MCH 27.8 27.8 27.4  MCHC 34.1 34.5 33.9  RDW 13.4 13.4 13.3  PLT 213 229 256    BNP Recent Labs  Lab 11/27/18 0504  BNP 402.3*     DDimer No results for input(s): DDIMER in the last  168 hours.   Radiology    No results found.  Cardiac Studies   Echocardiogram 11/27/2018 1. Left ventricular ejection fraction, by visual estimation, is 45 to 50%. The left ventricle has mildly decreased function. Moderately increased left ventricular size. There is moderate left concentric ventricular hypertrophy. 2. Elevated mean left atrial pressure. 3. Left ventricular diastolic Doppler parameters are consistent with pseudonormalization pattern of LV diastolic filling. 4. Global right ventricle has normal systolic function.The right ventricular size is mildly enlarged. No increase in right ventricular wall thickness. 5. Left atrial size was moderately dilated. 6. Right atrial size was moderately dilated. 7. The pericardial effusion is circumferential. 8. Trivial pericardial effusion is present. 9. The mitral valve is normal in structure. No evidence of mitral valve regurgitation. 10. The tricuspid valve is normal in  structure. Tricuspid valve regurgitation was not visualized by color flow Doppler. 11. The aortic valve is normal in structure. Aortic valve regurgitation was not visualized by color flow Doppler. 12. The pulmonic valve was normal in structure. Pulmonic valve regurgitation is not visualized by color flow Doppler. 13. TR signal is inadequate for assessing pulmonary artery systolic pressure. 14. The inferior vena cava is dilated in size with >50% respiratory variability, suggesting right atrial pressure of 8 mmHg.  Patient Profile     38 y.o. male with malignant hypertension, acute on chronic diastolic heart failure now with mildly reduced left ventricular systolic function, CKD stage III, OSA, morbid obesity  Assessment & Plan    1. CHF: Clinically appears to be euvolemic. Weight stable and in/out even on current diuretic dose. Add daily K supplement.  I am very hopeful that there will be marked improvement in left ventricular systolic function with sustained blood pressure control.  Discussed sodium restriction and daily weight monitoring, signs and symptoms of heart failure exacerbation. 2. HTN: Clearly with malignant course with end-organ involvement.  Has first-degree family members with premature death due to heart failure and end-stage renal disease on hemodialysis. At target BP (130/80 or less) today.  Reviewed the risk of beta-blocker rebound. 3. CKD 3: Slight uptick in BUN and creatinine returning to baseline.  4. OSA: Consistent treatment with CPAP and weight loss would assist with blood pressure control. [facility] will sign off.   Medication Recommendations: Amlodipine 10 mg once daily, carvedilol 12.5 mg twice daily, furosemide 40 mg once daily, potassium chloride 20 mEq once daily, minoxidil 10 mg once daily, Other recommendations (labs, testing, etc): Echocardiogram in 3 months.  Basic metabolic panel in 2 weeks. Follow up as an outpatient: We will arrange transition of care  visit in 2 weeks  For questions or updates, please contact [place] Please consult [email or web address] for contact info under        Signed, Sanda Klein, MD  11/30/2018, 9:06 AM

## 2018-11-30 NOTE — Progress Notes (Signed)
Pt stable, d/c home

## 2018-11-30 NOTE — Discharge Instructions (Signed)
Heart Failure, Self Care Heart failure is a serious condition. This document explains the things you need to Harold Clements to take care of yourself after a heart failure diagnosis. You may be asked to change your diet, take certain medicines, and make other lifestyle changes in order to stay as healthy as possible. Your health care provider may also give you more specific instructions. If you have problems or questions, contact your health care provider. What are the risks? Having heart failure puts you at higher risk for certain problems. These problems can get worse if you Harold Clements not take good care of yourself. Problems may include:  Blood clotting problems. This may cause a stroke.  Damage to the kidneys, liver, or lungs.  Abnormal heart rhythms. Supplies needed:  Scale for monitoring weight.  Blood pressure monitor.  Notebook.  Medicines. How to care for yourself when you have heart failure Medicines Take over-the-counter and prescription medicines only as told by your health care provider. Medicines reduce the workload of your heart, slow the progression of heart failure, and improve symptoms. Take your medicines every day.  Harold Clements not stop taking your medicine unless your health care provider tells you to Harold Clements so.  Harold Clements not skip any dose of medicine.  Refill your prescriptions before you run out of medicine. Eating and drinking   Eat heart-healthy foods. Talk with a dietitian to make an eating plan that is right for you. ? Choose foods that contain no trans fat and are low in saturated fat and cholesterol. Healthy choices include fresh or frozen fruits and vegetables, fish, lean meats, legumes, fat-free or low-fat dairy products, and whole-grain or high-fiber foods. ? Limit salt (sodium) if told by your health care provider. Sodium restriction may reduce symptoms of heart failure. Ask a dietitian to recommend heart-healthy seasonings. ? Use healthy cooking methods instead of frying. Healthy methods  include roasting, grilling, broiling, baking, poaching, steaming, and stir-frying.  Limit your fluid intake, if directed by your health care provider. Fluid restriction may reduce symptoms of heart failure. Alcohol use  Harold Clements not drink alcohol if: ? Your health care provider tells you not to drink. ? Your heart was damaged by alcohol, or you have severe heart failure. ? You are pregnant, may be pregnant, or are planning to become pregnant.  If you drink alcohol: ? Limit how much you use to:  0-1 drink a day for women.  0-2 drinks a day for men. ? Be aware of how much alcohol is in your drink. In the [place] one drink equals one 12 oz bottle of beer (355 mL), one 5 oz glass of wine (148 mL), or one 1 oz glass of hard liquor (44 mL). Lifestyle   Harold Clements not use any products that contain nicotine or tobacco, such as cigarettes, e-cigarettes, and chewing tobacco. If you need help quitting, ask your health care provider. ? Harold Clements not use nicotine gum or patches before talking to your health care provider.  Harold Clements not use illegal drugs.  Work with your health care provider to safely reach the right body weight.  Harold Clements physical activity if told by your health care provider. Talk to your health care provider before you begin an exercise if: ? You are an older adult. ? You have severe heart failure.  Learn to manage stress. If you need help to Harold Clements this, ask your health care provider.  Participate in or seek rehabilitation as needed to keep or improve your independence and quality of life.  Plan   rest periods when you get tired. °Monitoring important information ° °· Weigh yourself every day. This will help you to notice if too much fluid is building up in your body. °? Weigh yourself every morning after you urinate and before you eat breakfast. °? Wear the same amount of clothing each time you weigh yourself. °? Record your daily weight. Provide your health care provider with your weight record. °· Monitor and  record your pulse and blood pressure as told by your health care provider. °Dealing with extreme temperatures °· If the weather is extremely hot: °? Avoid vigorous physical activity. °? Use air conditioning or fans, or find a cooler location. °? Avoid caffeine and alcohol. °? Wear loose-fitting, lightweight, and light-colored clothing. °· If the weather is extremely cold: °? Avoid vigorous activity. °? Layer your clothes. °? Wear mittens or gloves, a hat, and a scarf when you go outside. °? Avoid alcohol. °Follow these instructions at home: °· Stay up to date with vaccines. Pneumococcal and flu (influenza) vaccines are especially important in preventing infections of the airways. °· Keep all follow-up visits as told by your health care provider. This is important. °Contact a health care provider if you: °· Have a rapid weight gain. °· Have increasing shortness of breath. °· Are unable to participate in your usual physical activities. °· Get tired easily. °· Cough more than normal, especially with physical activity. °· Lose your appetite or feel nauseous. °· Have any swelling or more swelling in areas such as your hands, feet, ankles, or abdomen. °· Are unable to sleep because it is hard to breathe. °· Feel like your heart is beating quickly (palpitations). °· Become dizzy or light-headed when you stand up. °Get help right away if you: °· Have trouble breathing. °· Notice or your family notices a change in your awareness, such as having trouble staying awake or concentrating. °· Have pain or discomfort in your chest. °· Have an episode of fainting (syncope). °These symptoms may represent a serious problem that is an emergency. Harold Clements not wait to see if the symptoms will go away. Get medical help right away. Call your local emergency services (911 in the [place] Harold Clements not drive yourself to the hospital. °Summary °· Heart failure is a serious condition. To care for yourself, you may be asked to change your diet, take certain  medicines, and make other lifestyle changes. °· Take your medicines every day. Sherl Yzaguirre not stop taking them unless your health care provider tells you to Barry Faircloth so. °· Eat heart-healthy foods, such as fresh or frozen fruits and vegetables, fish, lean meats, legumes, fat-free or low-fat dairy products, and whole-grain or high-fiber foods. °· Ask your health care provider if you have any alcohol restrictions. You may have to stop drinking alcohol if you have severe heart failure. °· Contact your health care provider if you notice problems, such as rapid weight gain or a fast heartbeat. Get help right away if you faint, or have chest pain or trouble breathing. °This information is not intended to replace advice given to you by your health care provider. Make sure you discuss any questions you have with your health care provider. °Document Released: 06/01/2018 Document Revised: 05/31/2018 Document Reviewed: 06/01/2018 °Elsevier Patient Education © 2020 [employer]. ° °

## 2018-12-01 NOTE — Telephone Encounter (Signed)
Attempted to contact both number listed on chart. First number is disconnect and 2nd line is Direct TV.

## 2018-12-11 NOTE — Progress Notes (Signed)
Cardiology Office Note:    Date:  12/13/2018   ID:  Harold Clements, DOB 1980-04-25, MRN [id number]  PCP:  Patient, No Pcp Per  Cardiologist:  Sanda Klein, MD   Referring MD: No ref. provider found   Chief Complaint  Patient presents with   Hospitalization Follow-up    hypertension    History of Present Illness:    Harold Clements is a 38 y.o. male with a hx of multiple hospitalizations for hypertensive urgency, chronic diastolic heart failure, DM type II, CKD stage III, OSA intermittently compliant with CPAP, morbid obesity, and medication noncompliance.  He was seen on 11/28/2018 by Dr. Sallyanne Kuster for a new patient evaluation.  He had presented to the ER on 9/27 with shortness of breath.  History of renal artery ultrasound in 2018 that was negative for renal artery stenosis.  On arrival his BP was noted to be 255/180 mmHg.  He does have a history of noncompliance and renal insufficiency which complicates his management.  Echo during that admission showed an EF of 45 to 50%, moderate concentric LVH, elevated mean LA pressures, grade 2 diastolic dysfunction, moderate biatrial enlargement, trivial circumferential antral pericardial effusion, and no significant valvular abnormalities.  Baseline serum creatinine thought to be 1.7.  Medications were titrated as followed: Beta-blocker transition to carvedilol, hydralazine was stopped, started on minoxidil, continued amlodipine, and clonidine was discontinued for medication noncompliance and risk of rebound hypertension.  His 40 mg daily was resumed.  Unable to add an ace inhibitor, ARB, spironolactone due to renal insufficiency.  He was discharged on 11/30/2018 on the following regimen: Amlodipine 10 mg Carvedilol 12.5 mg twice daily Minoxidil 10 mg daily 40 mg Lasix daily 20 mEq potassium chloride daily  He presents today for hospital follow-up. He took his medications this morning. He is hypertensive in the 178/106. He has been checking his BP at  home with the following recent readings: 191/130 before medications, 164/89 after medications. He states he has been compliant on medications. He denies headache and vision changes.     Past Medical History:  Diagnosis Date   CKD (chronic kidney disease) stage 2, GFR 60-89 ml/min    Diabetes mellitus without complication ([place])    Ear pain 07/2017   Hypertension    Wasp sting 08/04/2016   of dorsum of left hand near thumb    History reviewed. No pertinent surgical history.  Current Medications: Current Meds  Medication Sig   amLODipine (NORVASC) 10 MG tablet Take 1 tablet (10 mg total) by mouth daily.   aspirin EC 81 MG EC tablet Take 1 tablet (81 mg total) by mouth daily.   atorvastatin (LIPITOR) 40 MG tablet Take 1 tablet (40 mg total) by mouth daily at 6 PM.   canagliflozin (INVOKANA) 100 MG TABS tablet Take 1 tablet (100 mg total) by mouth daily before breakfast.   carvedilol (COREG) 25 MG tablet Take 1 tablet (25 mg total) by mouth 2 (two) times daily with a meal.   furosemide (LASIX) 40 MG tablet Take 1 tablet (40 mg total) by mouth daily.   Insulin Isophane & Regular Human (NOVOLIN 70/30 FLEXPEN) (70-30) 100 UNIT/ML PEN Inject 26 Units into the skin 2 (two) times daily.   metFORMIN (GLUCOPHAGE) 1000 MG tablet Take 1 tablet (1,000 mg total) by mouth 2 (two) times daily with a meal. IM program.   minoxidil (LONITEN) 10 MG tablet Take 1 tablet (10 mg total) by mouth 2 (two) times daily.   potassium chloride (  KLOR-CON) 20 MEQ packet Take 20 mEq by mouth daily.   [DISCONTINUED] carvedilol (COREG) 12.5 MG tablet Take 1 tablet (12.5 mg total) by mouth 2 (two) times daily with a meal.   [DISCONTINUED] minoxidil (LONITEN) 10 MG tablet Take 1 tablet (10 mg total) by mouth daily.     Allergies:   Zithromax [azithromycin dihydrate] and Kiwi extract   Social History   Socioeconomic History   Marital status: Married    Spouse name: Not on file   Number of children:  Not on file   Years of education: Not on file   Highest education level: Not on file  Occupational History   Occupation: Engineer, agricultural  Social Needs   Financial resource strain: Not on file   Food insecurity    Worry: Not on file    Inability: Not on file   Transportation needs    Medical: Not on file    Non-medical: Not on file  Tobacco Use   Smoking status: Former Smoker    Years: 4.00    Types: Cigars    Quit date: 2010    Years since quitting: 10.7   Smokeless tobacco: Never Used  Substance and Sexual Activity   Alcohol use: No   Drug use: No   Sexual activity: Yes  Lifestyle   Physical activity    Days per week: Not on file    Minutes per session: Not on file   Stress: Not on file  Relationships   Social connections    Talks on phone: Not on file    Gets together: Not on file    Attends religious service: Not on file    Active member of club or organization: Not on file    Attends meetings of clubs or organizations: Not on file    Relationship status: Not on file  Other Topics Concern   Not on file  Social History Narrative   Not on file     Family History: The patient's family history includes Diabetes in his mother; Heart disease in his mother; Hypertension in his father and mother.  ROS:   Please see the history of present illness.     All other systems reviewed and are negative.  EKGs/Labs/Other Studies Reviewed:    The following studies were reviewed today:  Echocardiogram 11/27/2018 1. Left ventricular ejection fraction, by visual estimation, is 45 to 50%. The left ventricle has mildly decreased function. Moderately increased left ventricular size. There is moderate left concentric ventricular hypertrophy. 2. Elevated mean left atrial pressure. 3. Left ventricular diastolic Doppler parameters are consistent with pseudonormalization pattern of LV diastolic filling. 4. Global right ventricle has normal systolic function.The  right ventricular size is mildly enlarged. No increase in right ventricular wall thickness. 5. Left atrial size was moderately dilated. 6. Right atrial size was moderately dilated. 7. The pericardial effusion is circumferential. 8. Trivial pericardial effusion is present. 9. The mitral valve is normal in structure. No evidence of mitral valve regurgitation. 10. The tricuspid valve is normal in structure. Tricuspid valve regurgitation was not visualized by color flow Doppler. 11. The aortic valve is normal in structure. Aortic valve regurgitation was not visualized by color flow Doppler. 12. The pulmonic valve was normal in structure. Pulmonic valve regurgitation is not visualized by color flow Doppler. 13. TR signal is inadequate for assessing pulmonary artery systolic pressure. 14. The inferior vena cava is dilated in size with >50% respiratory variability, suggesting right atrial pressure of 8 mmHg.  EKG:  EKG is not ordered today.    Recent Labs: 11/27/2018: B Natriuretic Peptide 402.3; TSH 3.112 11/29/2018: Hemoglobin 16.5; Platelets 256 11/30/2018: BUN 32; Creatinine, Ser 2.40; Potassium 3.8; Sodium 136  Recent Lipid Panel    Component Value Date/Time   CHOL 163 11/28/2018 0952   TRIG 133 11/28/2018 0952   HDL 31 (L) 11/28/2018 0952   CHOLHDL 5.3 11/28/2018 0952   VLDL 27 11/28/2018 0952   LDLCALC 105 (H) 11/28/2018 0952    Physical Exam:    VS:  BP (!) 178/106    Pulse 97    Ht 5\' 9"  (1.753 m)    Wt 267 lb (121.1 kg)    SpO2 97%    BMI 39.43 kg/m     Wt Readings from Last 3 Encounters:  12/13/18 267 lb (121.1 kg)  11/30/18 261 lb 3.9 oz (118.5 kg)  03/29/18 272 lb (123.4 kg)     GEN: obese male in no acute distress HEENT: Normal NECK: No JVD; No carotid bruits CARDIAC: RRR, no murmurs, rubs, gallops RESPIRATORY:  Clear to auscultation without rales, wheezing or rhonchi  ABDOMEN: Soft, non-tender, non-distended MUSCULOSKELETAL:  No edema; No deformity  SKIN: Warm  and dry NEUROLOGIC:  Alert and oriented x 3 PSYCHIATRIC:  Normal affect   ASSESSMENT:    1. Hypertension associated with diabetes ([place])   2. CKD (chronic kidney disease) stage 2, GFR 60-89 ml/min   3. Obesity, Class III, BMI 40-49.9 (morbid obesity) ([place])   4. Chronic diastolic heart failure ([facility])    PLAN:    In order of problems listed above:  Hypertension Continues to be hypertensive. He reports medication compliance. I will increase minoxidil to 10 mg BID and increase coreg to 25 mg BID. He will keep a BP log and return in 3 weeks. If still uncontrolled, I will add hydralazine.    Chronic diastolic heart failure Mildly reduced EF to 45 to 50% Consider repeat echo if BP controlled for 3 months   CKD stage III BMP today. No ACEI/ARB/spiro   Suspected OSA He snores and has daytime fatigue. He states he has never had a sleep study. He used a CPAP in the hospital previously and states he slept so much better. CPAP use in the hospital resulted in better sleep and less daytime fatigue.  I will refer to a sleep study.    Follow up with me in 3 weeks with BP log.   Medication Adjustments/Labs and Tests Ordered: Current medicines are reviewed at length with the patient today.  Concerns regarding medicines are outlined above.  Orders Placed This Encounter  Procedures   Basic metabolic panel   Meds ordered this encounter  Medications   minoxidil (LONITEN) 10 MG tablet    Sig: Take 1 tablet (10 mg total) by mouth 2 (two) times daily.    Dispense:  60 tablet    Refill:  1   carvedilol (COREG) 25 MG tablet    Sig: Take 1 tablet (25 mg total) by mouth 2 (two) times daily with a meal.    Dispense:  60 tablet    Refill:  1    Signed, [name], [place]  12/13/2018 10:13 AM    [place]

## 2018-12-13 ENCOUNTER — Encounter: Payer: Self-pay | Admitting: Physician Assistant

## 2018-12-13 ENCOUNTER — Ambulatory Visit (INDEPENDENT_AMBULATORY_CARE_PROVIDER_SITE_OTHER): Payer: Medicaid Other | Admitting: Physician Assistant

## 2018-12-13 ENCOUNTER — Other Ambulatory Visit: Payer: Self-pay

## 2018-12-13 VITALS — BP 178/106 | HR 97 | Ht 69.0 in | Wt 267.0 lb

## 2018-12-13 DIAGNOSIS — I10 Essential (primary) hypertension: Secondary | ICD-10-CM | POA: Diagnosis not present

## 2018-12-13 DIAGNOSIS — R400 Somnolence: Secondary | ICD-10-CM | POA: Diagnosis not present

## 2018-12-13 DIAGNOSIS — I5032 Chronic diastolic (congestive) heart failure: Secondary | ICD-10-CM

## 2018-12-13 DIAGNOSIS — E1159 Type 2 diabetes mellitus with other circulatory complications: Secondary | ICD-10-CM | POA: Diagnosis not present

## 2018-12-13 DIAGNOSIS — N182 Chronic kidney disease, stage 2 (mild): Secondary | ICD-10-CM | POA: Diagnosis not present

## 2018-12-13 DIAGNOSIS — R0683 Snoring: Secondary | ICD-10-CM | POA: Diagnosis not present

## 2018-12-13 LAB — BASIC METABOLIC PANEL
BUN/Creatinine Ratio: 13 (ref 9–20)
BUN: 25 mg/dL — ABNORMAL HIGH (ref 6–20)
CO2: 22 mmol/L (ref 20–29)
Calcium: 9.2 mg/dL (ref 8.7–10.2)
Chloride: 101 mmol/L (ref 96–106)
Creatinine, Ser: 1.92 mg/dL — ABNORMAL HIGH (ref 0.76–1.27)
GFR calc Af Amer: 51 mL/min/{1.73_m2} — ABNORMAL LOW (ref >59)
GFR calc non Af Amer: 44 mL/min/{1.73_m2} — ABNORMAL LOW (ref >59)
Glucose: 222 mg/dL — ABNORMAL HIGH (ref 65–99)
Potassium: 4.5 mmol/L (ref 3.5–5.2)
Sodium: 139 mmol/L (ref 134–144)

## 2018-12-13 MED ORDER — CARVEDILOL 25 MG PO TABS: 25.0000 mg | ORAL_TABLET | Freq: Two times a day (BID) | ORAL | 1 refills | Status: DC

## 2018-12-13 MED ORDER — MINOXIDIL 10 MG PO TABS: 10.0000 mg | ORAL_TABLET | Freq: Two times a day (BID) | ORAL | 1 refills | Status: DC

## 2018-12-13 NOTE — Addendum Note (Signed)
Addended by: Therisa Doyne on: 12/13/2018 01:29 PM   Modules accepted: Orders

## 2018-12-13 NOTE — Patient Instructions (Signed)
Medication Instructions:  Increase Minoxidil to 10 mg TWICE DAILY.  Increase Carvedilol to 25 mg--ONE TABLET TWICE DAILY. (TAKE 12.5 MG TABLETS--2 TABLETS TWICE DAILY)  If you need a refill on your cardiac medications before your next appointment, please call your pharmacy.   Lab work: Your physician recommends that you return for lab work today: BMET  If you have labs (blood work) drawn today and your tests are completely normal, you will receive your results only by: Marland Kitchen MyChart Message (if you have MyChart) OR . A paper copy in the mail If you have any lab test that is abnormal or we need to change your treatment, we will call you to review the results.   Follow-Up: At [facility], you and your health needs are our priority.  As part of our continuing mission to provide you with exceptional heart care, we have created designated Provider Care Teams.  These Care Teams include your primary Cardiologist (physician) and Advanced Practice Providers (APPs -  Physician Assistants and Nurse Practitioners) who all work together to provide you with the care you need, when you need it. . Please follow-up with Fabian Sharp, [place] on Thursday, 12/29/18 at 9:15 AM IN OFFICE.

## 2018-12-13 NOTE — Progress Notes (Signed)
Wow, that is some resistant BP. Agree with changes you made today. Hydralazine does not add much to minoxidil. If BP still high would add spironolactone and check BMET after about 2 weeks (would need to possibly stop KCl supplement).

## 2018-12-16 ENCOUNTER — Telehealth: Payer: Self-pay | Admitting: *Deleted

## 2018-12-16 NOTE — Telephone Encounter (Signed)
Patient notified of sleep study and [facility] appointments. He voiced verbal understanding for the need to quarantine once he has had his COVID test.

## 2018-12-24 ENCOUNTER — Other Ambulatory Visit (HOSPITAL_COMMUNITY): Payer: Medicaid Other

## 2018-12-24 ENCOUNTER — Encounter (HOSPITAL_BASED_OUTPATIENT_CLINIC_OR_DEPARTMENT_OTHER): Payer: Self-pay

## 2018-12-26 ENCOUNTER — Encounter (HOSPITAL_BASED_OUTPATIENT_CLINIC_OR_DEPARTMENT_OTHER): Payer: Self-pay

## 2018-12-27 NOTE — Progress Notes (Deleted)
Cardiology Office Note:    Date:  12/27/2018   ID:  Harold Clements, DOB 1980-05-25, MRN [id number]  PCP:  Patient, No Pcp Per  Cardiologist:  Sanda Klein, MD   Referring MD: No ref. provider found   No chief complaint on file. ***  History of Present Illness:    Harold Clements is a 38 y.o. male with a hx of multiple hospitalizations for hypertensive urgency, chronic diastolic heart failure, DM type II, CKD stage III, OSA intermittently compliant with CPAP, morbid obesity, and medication noncompliance.  He was seen on 11/28/2018 by Dr. Sallyanne Kuster for a new patient evaluation.  He had presented to the ER on 9/27 with shortness of breath.  History of renal artery ultrasound in 2018 that was negative for renal artery stenosis.  On arrival his BP was noted to be 255/180 mmHg.  He does have a history of noncompliance and renal insufficiency which complicates his management.  Echo during that admission showed an EF of 45 to 50%, moderate concentric LVH, elevated mean LA pressures, grade 2 diastolic dysfunction, moderate biatrial enlargement, trivial circumferential antral pericardial effusion, and no significant valvular abnormalities.  Baseline serum creatinine thought to be 1.7.  Medications were titrated as followed: Beta-blocker transition to carvedilol, hydralazine was stopped, started on minoxidil, continued amlodipine, and clonidine was discontinued for medication noncompliance and risk of rebound hypertension.  His 40 mg daily was resumed.  Unable to add an ace inhibitor, ARB, spironolactone due to renal insufficiency. Discharged on amlodipine, coreg, minoxidil, 40 mg lasix, 20 mEq K. I saw him in follow up on 12/13/18. Given the above, he was still quite hypertensive. I increased minoxidil to 10 mg BID and increased coreg to 25 mg BID. He had instructions to return with a BP log.   He presents today for follow up.  In consultation with Dr. Sallyanne Kuster, *** do not add hydralazine, add spiro, stop K    Hypertension   Chronic diastolic heart failure Mildly reduced EF to 45 to 50% Consider repeat echo if BP controlled for 3 months   CKD stage III sCr improved to 1.92 (2.40)  OSA Referred for sleep study at last visit. He used a CPAP during his last hospitalization.      Past Medical History:  Diagnosis Date  . 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 Diabetes mellitus without complication ([place])   . Ear pain 07/2017  . Hypertension   . Wasp sting 08/04/2016   of dorsum of left hand near thumb    No past surgical history on file.  Current Medications: No outpatient medications have been marked as taking for the 12/29/18 encounter (Appointment) with [name], [place].     Allergies:   Zithromax [azithromycin dihydrate] and Kiwi extract   Social History   Socioeconomic History  . Marital status: Married    Spouse name: Not on file  . Number of children: Not on file  . Years of education: Not on file  . Highest education level: Not on file  Occupational History  . Occupation: Engineer, agricultural  Social Needs  . Financial resource strain: Not on file  . Food insecurity    Worry: Not on file    Inability: Not on file  . Transportation needs    Medical: Not on file    Non-medical: Not on file  Tobacco Use  . Smoking status: Former Smoker    Years: 4.00    Types: Cigars  Quit date: 2010    Years since quitting: 10.8  . Smokeless tobacco: Never Used  Substance and Sexual Activity  . Alcohol use: No  . Drug use: No  . Sexual activity: Yes  Lifestyle  . Physical activity    Days per week: Not on file    Minutes per session: Not on file  . Stress: Not on file  Relationships  . Social Herbalist on phone: Not on file    Gets together: Not on file    Attends religious service: Not on file    Active member of club or organization: Not on file    Attends meetings of clubs or organizations: Not on file    Relationship  status: Not on file  Other Topics Concern  . Not on file  Social History Narrative  . Not on file     Family History: The patient's ***family history includes Diabetes in his mother; Heart disease in his mother; Hypertension in his father and mother.  ROS:   Please see the history of present illness.    *** All other systems reviewed and are negative.  EKGs/Labs/Other Studies Reviewed:    The following studies were reviewed today: ***  EKG:  EKG is *** ordered today.  The ekg ordered today demonstrates ***  Recent Labs: 11/27/2018: B Natriuretic Peptide 402.3; TSH 3.112 11/29/2018: Hemoglobin 16.5; Platelets 256 12/13/2018: BUN 25; Creatinine, Ser 1.92; Potassium 4.5; Sodium 139  Recent Lipid Panel    Component Value Date/Time   CHOL 163 11/28/2018 0952   TRIG 133 11/28/2018 0952   HDL 31 (L) 11/28/2018 0952   CHOLHDL 5.3 11/28/2018 0952   VLDL 27 11/28/2018 0952   LDLCALC 105 (H) 11/28/2018 [id number]    Physical Exam:    VS:  There were no vitals taken for this visit.    Wt Readings from Last 3 Encounters:  12/13/18 267 lb (121.1 kg)  11/30/18 261 lb 3.9 oz (118.5 kg)  03/29/18 272 lb (123.4 kg)     GEN: *** Well nourished, well developed in no acute distress HEENT: Normal NECK: No JVD; No carotid bruits LYMPHATICS: No lymphadenopathy CARDIAC: ***RRR, no murmurs, rubs, gallops RESPIRATORY:  Clear to auscultation without rales, wheezing or rhonchi  ABDOMEN: Soft, non-tender, non-distended MUSCULOSKELETAL:  No edema; No deformity  SKIN: Warm and dry NEUROLOGIC:  Alert and oriented x 3 PSYCHIATRIC:  Normal affect   ASSESSMENT:    No diagnosis found. PLAN:    In order of problems listed above:  No diagnosis found.   Medication Adjustments/Labs and Tests Ordered: Current medicines are reviewed at length with the patient today.  Concerns regarding medicines are outlined above.  No orders of the defined types were placed in this encounter.  No orders of the  defined types were placed in this encounter.   Signed, [name], PA  12/27/2018 3:32 PM    [facility]

## 2018-12-28 ENCOUNTER — Encounter (HOSPITAL_BASED_OUTPATIENT_CLINIC_OR_DEPARTMENT_OTHER): Payer: Medicaid Other | Admitting: Cardiovascular Disease

## 2018-12-29 ENCOUNTER — Ambulatory Visit: Payer: Medicaid Other | Admitting: Physician Assistant

## 2019-01-11 ENCOUNTER — Observation Stay (HOSPITAL_BASED_OUTPATIENT_CLINIC_OR_DEPARTMENT_OTHER): Payer: Medicaid Other

## 2019-01-11 ENCOUNTER — Emergency Department (HOSPITAL_COMMUNITY): Payer: Medicaid Other

## 2019-01-11 ENCOUNTER — Other Ambulatory Visit: Payer: Self-pay

## 2019-01-11 ENCOUNTER — Observation Stay (HOSPITAL_COMMUNITY): Payer: Medicaid Other

## 2019-01-11 ENCOUNTER — Inpatient Hospital Stay (HOSPITAL_COMMUNITY)
Admission: EM | Admit: 2019-01-11 | Discharge: 2019-01-13 | DRG: 065 | Disposition: A | Discharge status: Home / Self Care | Payer: Medicaid Other | Attending: Internal Medicine | Admitting: Internal Medicine

## 2019-01-11 DIAGNOSIS — R471 Dysarthria and anarthria: Secondary | ICD-10-CM | POA: Diagnosis present

## 2019-01-11 DIAGNOSIS — Z794 Long term (current) use of insulin: Secondary | ICD-10-CM

## 2019-01-11 DIAGNOSIS — I10 Essential (primary) hypertension: Secondary | ICD-10-CM | POA: Diagnosis present

## 2019-01-11 DIAGNOSIS — R29700 NIHSS score 0: Secondary | ICD-10-CM | POA: Diagnosis present

## 2019-01-11 DIAGNOSIS — E119 Type 2 diabetes mellitus without complications: Secondary | ICD-10-CM

## 2019-01-11 DIAGNOSIS — N182 Chronic kidney disease, stage 2 (mild): Secondary | ICD-10-CM

## 2019-01-11 DIAGNOSIS — Z9114 Patient's other noncompliance with medication regimen: Secondary | ICD-10-CM | POA: Diagnosis not present

## 2019-01-11 DIAGNOSIS — I5032 Chronic diastolic (congestive) heart failure: Secondary | ICD-10-CM | POA: Diagnosis not present

## 2019-01-11 DIAGNOSIS — Z20828 Contact with and (suspected) exposure to other viral communicable diseases: Secondary | ICD-10-CM | POA: Diagnosis present

## 2019-01-11 DIAGNOSIS — R29810 Facial weakness: Secondary | ICD-10-CM | POA: Diagnosis not present

## 2019-01-11 DIAGNOSIS — I5022 Chronic systolic (congestive) heart failure: Secondary | ICD-10-CM | POA: Diagnosis not present

## 2019-01-11 DIAGNOSIS — N1831 Chronic kidney disease, stage 3a: Secondary | ICD-10-CM | POA: Diagnosis present

## 2019-01-11 DIAGNOSIS — Z23 Encounter for immunization: Secondary | ICD-10-CM | POA: Diagnosis not present

## 2019-01-11 DIAGNOSIS — Z823 Family history of stroke: Secondary | ICD-10-CM | POA: Diagnosis not present

## 2019-01-11 DIAGNOSIS — Z79899 Other long term (current) drug therapy: Secondary | ICD-10-CM

## 2019-01-11 DIAGNOSIS — I63511 Cerebral infarction due to unspecified occlusion or stenosis of right middle cerebral artery: Secondary | ICD-10-CM | POA: Diagnosis present

## 2019-01-11 DIAGNOSIS — E1169 Type 2 diabetes mellitus with other specified complication: Secondary | ICD-10-CM | POA: Diagnosis present

## 2019-01-11 DIAGNOSIS — Z6838 Body mass index (BMI) 38.0-38.9, adult: Secondary | ICD-10-CM | POA: Diagnosis not present

## 2019-01-11 DIAGNOSIS — R29701 NIHSS score 1: Secondary | ICD-10-CM | POA: Diagnosis not present

## 2019-01-11 DIAGNOSIS — I6359 Cerebral infarction due to unspecified occlusion or stenosis of other cerebral artery: Secondary | ICD-10-CM | POA: Diagnosis not present

## 2019-01-11 DIAGNOSIS — Z7982 Long term (current) use of aspirin: Secondary | ICD-10-CM | POA: Diagnosis not present

## 2019-01-11 DIAGNOSIS — Z833 Family history of diabetes mellitus: Secondary | ICD-10-CM

## 2019-01-11 DIAGNOSIS — Z87891 Personal history of nicotine dependence: Secondary | ICD-10-CM

## 2019-01-11 DIAGNOSIS — I152 Hypertension secondary to endocrine disorders: Secondary | ICD-10-CM | POA: Diagnosis present

## 2019-01-11 DIAGNOSIS — N183 Chronic kidney disease, stage 3 unspecified: Secondary | ICD-10-CM | POA: Diagnosis not present

## 2019-01-11 DIAGNOSIS — Z6841 Body Mass Index (BMI) 40.0 and over, adult: Secondary | ICD-10-CM

## 2019-01-11 DIAGNOSIS — I639 Cerebral infarction, unspecified: Secondary | ICD-10-CM | POA: Diagnosis present

## 2019-01-11 DIAGNOSIS — R32 Unspecified urinary incontinence: Secondary | ICD-10-CM | POA: Diagnosis not present

## 2019-01-11 DIAGNOSIS — G8194 Hemiplegia, unspecified affecting left nondominant side: Secondary | ICD-10-CM | POA: Diagnosis not present

## 2019-01-11 DIAGNOSIS — Z91018 Allergy to other foods: Secondary | ICD-10-CM

## 2019-01-11 DIAGNOSIS — E1165 Type 2 diabetes mellitus with hyperglycemia: Secondary | ICD-10-CM | POA: Diagnosis present

## 2019-01-11 DIAGNOSIS — E1159 Type 2 diabetes mellitus with other circulatory complications: Secondary | ICD-10-CM | POA: Diagnosis present

## 2019-01-11 DIAGNOSIS — Z8249 Family history of ischemic heart disease and other diseases of the circulatory system: Secondary | ICD-10-CM | POA: Diagnosis not present

## 2019-01-11 DIAGNOSIS — E669 Obesity, unspecified: Secondary | ICD-10-CM | POA: Diagnosis present

## 2019-01-11 DIAGNOSIS — G4733 Obstructive sleep apnea (adult) (pediatric): Secondary | ICD-10-CM | POA: Diagnosis not present

## 2019-01-11 DIAGNOSIS — E118 Type 2 diabetes mellitus with unspecified complications: Secondary | ICD-10-CM

## 2019-01-11 DIAGNOSIS — E1122 Type 2 diabetes mellitus with diabetic chronic kidney disease: Secondary | ICD-10-CM | POA: Diagnosis present

## 2019-01-11 DIAGNOSIS — N184 Chronic kidney disease, stage 4 (severe): Secondary | ICD-10-CM | POA: Diagnosis present

## 2019-01-11 DIAGNOSIS — I6389 Other cerebral infarction: Secondary | ICD-10-CM

## 2019-01-11 DIAGNOSIS — I119 Hypertensive heart disease without heart failure: Secondary | ICD-10-CM | POA: Diagnosis present

## 2019-01-11 DIAGNOSIS — Z881 Allergy status to other antibiotic agents status: Secondary | ICD-10-CM | POA: Diagnosis not present

## 2019-01-11 DIAGNOSIS — R29818 Other symptoms and signs involving the nervous system: Secondary | ICD-10-CM | POA: Diagnosis not present

## 2019-01-11 DIAGNOSIS — R4182 Altered mental status, unspecified: Secondary | ICD-10-CM

## 2019-01-11 DIAGNOSIS — I130 Hypertensive heart and chronic kidney disease with heart failure and stage 1 through stage 4 chronic kidney disease, or unspecified chronic kidney disease: Secondary | ICD-10-CM | POA: Diagnosis present

## 2019-01-11 LAB — CBC
HCT: 46.1 % (ref 39.0–52.0)
Hemoglobin: 15.4 g/dL (ref 13.0–17.0)
MCH: 26.9 pg (ref 26.0–34.0)
MCHC: 33.4 g/dL (ref 30.0–36.0)
MCV: 80.5 fL (ref 80.0–100.0)
Platelets: 229 10*3/uL (ref 150–400)
RBC: 5.73 MIL/uL (ref 4.22–5.81)
RDW: 12.8 % (ref 11.5–15.5)
WBC: 5.6 10*3/uL (ref 4.0–10.5)
nRBC: 0 % (ref 0.0–0.2)

## 2019-01-11 LAB — I-STAT CHEM 8, ED
BUN: 27 mg/dL — ABNORMAL HIGH (ref 6–20)
Calcium, Ion: 1.21 mmol/L (ref 1.15–1.40)
Chloride: 105 mmol/L (ref 98–111)
Creatinine, Ser: 2.1 mg/dL — ABNORMAL HIGH (ref 0.61–1.24)
Glucose, Bld: 207 mg/dL — ABNORMAL HIGH (ref 70–99)
HCT: 46 % (ref 39.0–52.0)
Hemoglobin: 15.6 g/dL (ref 13.0–17.0)
Potassium: 4.1 mmol/L (ref 3.5–5.1)
Sodium: 144 mmol/L (ref 135–145)
TCO2: 26 mmol/L (ref 22–32)

## 2019-01-11 LAB — COMPREHENSIVE METABOLIC PANEL
ALT: 13 U/L (ref 0–44)
AST: 17 U/L (ref 15–41)
Albumin: 3.9 g/dL (ref 3.5–5.0)
Alkaline Phosphatase: 71 U/L (ref 38–126)
Anion gap: 10 (ref 5–15)
BUN: 22 mg/dL — ABNORMAL HIGH (ref 6–20)
CO2: 23 mmol/L (ref 22–32)
Calcium: 9.5 mg/dL (ref 8.9–10.3)
Chloride: 108 mmol/L (ref 98–111)
Creatinine, Ser: 2.18 mg/dL — ABNORMAL HIGH (ref 0.61–1.24)
GFR calc Af Amer: 44 mL/min — ABNORMAL LOW (ref >60)
GFR calc non Af Amer: 38 mL/min — ABNORMAL LOW (ref >60)
Glucose, Bld: 206 mg/dL — ABNORMAL HIGH (ref 70–99)
Potassium: 4 mmol/L (ref 3.5–5.1)
Sodium: 141 mmol/L (ref 135–145)
Total Bilirubin: 1.3 mg/dL — ABNORMAL HIGH (ref 0.3–1.2)
Total Protein: 8.1 g/dL (ref 6.5–8.1)

## 2019-01-11 LAB — ECHOCARDIOGRAM COMPLETE
Height: 69 in
Weight: 4480 oz

## 2019-01-11 LAB — DIFFERENTIAL
Abs Immature Granulocytes: 0.01 10*3/uL (ref 0.00–0.07)
Basophils Absolute: 0 10*3/uL (ref 0.0–0.1)
Basophils Relative: 1 %
Eosinophils Absolute: 0.1 10*3/uL (ref 0.0–0.5)
Eosinophils Relative: 2 %
Immature Granulocytes: 0 %
Lymphocytes Relative: 26 %
Lymphs Abs: 1.5 10*3/uL (ref 0.7–4.0)
Monocytes Absolute: 0.4 10*3/uL (ref 0.1–1.0)
Monocytes Relative: 8 %
Neutro Abs: 3.5 10*3/uL (ref 1.7–7.7)
Neutrophils Relative %: 63 %

## 2019-01-11 LAB — PROTIME-INR
INR: 1.1 (ref 0.8–1.2)
Prothrombin Time: 14.1 seconds (ref 11.4–15.2)

## 2019-01-11 LAB — GLUCOSE, CAPILLARY: Glucose-Capillary: 114 mg/dL — ABNORMAL HIGH (ref 70–99)

## 2019-01-11 LAB — AMMONIA: Ammonia: 35 umol/L (ref 9–35)

## 2019-01-11 LAB — TSH: TSH: 2.986 u[IU]/mL (ref 0.350–4.500)

## 2019-01-11 LAB — APTT: aPTT: 29 seconds (ref 24–36)

## 2019-01-11 LAB — SARS CORONAVIRUS 2 (TAT 6-24 HRS): SARS Coronavirus 2: NEGATIVE

## 2019-01-11 LAB — CBG MONITORING, ED: Glucose-Capillary: 142 mg/dL — ABNORMAL HIGH (ref 70–99)

## 2019-01-11 MED ORDER — INFLUENZA VAC SPLIT QUAD 0.5 ML IM SUSY
0.5000 mL | PREFILLED_SYRINGE | INTRAMUSCULAR | Status: AC
  Administered 2019-01-12: 0.5 mL via INTRAMUSCULAR

## 2019-01-11 MED ORDER — CLOPIDOGREL BISULFATE 75 MG PO TABS
75.0000 mg | ORAL_TABLET | Freq: Every day | ORAL | Status: DC
  Administered 2019-01-11 – 2019-01-13 (×3): 75 mg via ORAL
  Filled 2019-01-11 (×3): qty 1

## 2019-01-11 MED ORDER — STROKE: EARLY STAGES OF RECOVERY BOOK
Freq: Once | Status: AC
  Administered 2019-01-12: 13:00:00
  Filled 2019-01-11: qty 1

## 2019-01-11 MED ORDER — ENOXAPARIN SODIUM 40 MG/0.4ML ~~LOC~~ SOLN
40.0000 mg | SUBCUTANEOUS | Status: DC
  Administered 2019-01-11: 19:00:00 40 mg via SUBCUTANEOUS
  Filled 2019-01-11: qty 0.4

## 2019-01-11 MED ORDER — SODIUM CHLORIDE 0.9% FLUSH
3.0000 mL | Freq: Once | INTRAVENOUS | Status: AC
  Administered 2019-01-11: 19:00:00 3 mL via INTRAVENOUS

## 2019-01-11 MED ORDER — ASPIRIN 81 MG PO CHEW
81.0000 mg | CHEWABLE_TABLET | Freq: Every day | ORAL | Status: DC
  Administered 2019-01-12 – 2019-01-13 (×2): 81 mg via ORAL
  Filled 2019-01-11 (×3): qty 1

## 2019-01-11 MED ORDER — ACETAMINOPHEN 650 MG RE SUPP: 650.0000 mg | RECTAL | Status: DC | PRN

## 2019-01-11 MED ORDER — ACETAMINOPHEN 325 MG PO TABS
650.0000 mg | ORAL_TABLET | ORAL | Status: DC | PRN
  Administered 2019-01-12: 21:00:00 650 mg via ORAL
  Filled 2019-01-11: qty 2

## 2019-01-11 MED ORDER — INSULIN ASPART 100 UNIT/ML ~~LOC~~ SOLN
5.0000 [IU] | Freq: Three times a day (TID) | SUBCUTANEOUS | Status: DC
  Administered 2019-01-12 – 2019-01-13 (×4): 5 [IU] via SUBCUTANEOUS

## 2019-01-11 MED ORDER — ATORVASTATIN CALCIUM 80 MG PO TABS
80.0000 mg | ORAL_TABLET | Freq: Every day | ORAL | Status: DC
  Administered 2019-01-12: 18:00:00 80 mg via ORAL
  Filled 2019-01-11: qty 1

## 2019-01-11 MED ORDER — INSULIN GLARGINE 100 UNIT/ML ~~LOC~~ SOLN
16.0000 [IU] | Freq: Every day | SUBCUTANEOUS | Status: DC
  Administered 2019-01-11 – 2019-01-12 (×2): 16 [IU] via SUBCUTANEOUS
  Filled 2019-01-11 (×3): qty 0.16

## 2019-01-11 MED ORDER — ACETAMINOPHEN 160 MG/5ML PO SOLN: 650.0000 mg | ORAL | Status: DC | PRN

## 2019-01-11 NOTE — ED Notes (Signed)
Pt is more sleepy than when he came in, does respond, but falls back to sleep

## 2019-01-11 NOTE — ED Notes (Signed)
Wife- sherry [phone]- would like to be updated with any changes.

## 2019-01-11 NOTE — ED Notes (Addendum)
Attempted report.   Bed was soiled. Pt was changed, chucks placed under with clean gown

## 2019-01-11 NOTE — ED Provider Notes (Addendum)
[place] EMERGENCY DEPARTMENT Provider Note   CSN: [id number] Arrival date & time: 01/11/19  [id number]     History   Chief Complaint No chief complaint on file.   HPI Harold Clements is a 38 y.o. male.     HPI  38 year old male with history of CKD, diabetes, hypertension, hyperlipidemia comes in a chief complaint of left-sided weakness and confusion.  Patient came in via private conveyance and wife is not at the bedside.  Patient alleges that he went to bed midnight feeling okay.  He woke up around 830 and was fine.  Subsequently the wife noted that patient had left-sided facial droop and called for help.  Patient is now complaining of left upper extremity weakness, and he is clear that he did not have any weakness when he woke up.  No history of stroke or CAD.  Denies any substance abuse, headache, neck pain.  Past Medical History:  Diagnosis Date   CKD (chronic kidney disease) stage 2, GFR 60-89 ml/min    Diabetes mellitus without complication ([place])    Ear pain 07/2017   Hypertension    Wasp sting 08/04/2016   of dorsum of left hand near thumb    Patient Active Problem List   Diagnosis Date Noted   Hypertensive emergency 11/28/2018   Hypertensive crisis 11/27/2018   Obesity, Class III, BMI 40-49.9 (morbid obesity) ([place]) 11/27/2018   Mastoiditis 08/12/2017   CKD (chronic kidney disease) stage 2, GFR 60-89 ml/min 08/17/2016   Hypertension associated with diabetes ([place]) 08/06/2016   Diabetes ([place]) 08/06/2016    No past surgical history on file.      Home Medications    Prior to Admission medications   Medication Sig Start Date End Date Taking? Authorizing Provider  amLODipine (NORVASC) 10 MG tablet Take 1 tablet (10 mg total) by mouth daily. 11/30/18 01/11/19 Yes Antonieta Pert, MD  atorvastatin (LIPITOR) 40 MG tablet Take 1 tablet (40 mg total) by mouth daily at 6 PM. 11/30/18 01/11/19 Yes Kc, Maren [place], MD  carvedilol (COREG) 25 MG tablet  Take 1 tablet (25 mg total) by mouth 2 (two) times daily with a meal. Patient taking differently: Take 12.5 mg by mouth 2 (two) times daily with a meal.  12/13/18 01/12/19 Yes Duke, Tami Lin, PA  furosemide (LASIX) 40 MG tablet Take 1 tablet (40 mg total) by mouth daily. 12/01/18 01/11/19 Yes Antonieta Pert, MD  Insulin Isophane & Regular Human (NOVOLIN 70/30 FLEXPEN) (70-30) 100 UNIT/ML PEN Inject 26 Units into the skin 2 (two) times daily.   Yes [provider]  minoxidil (LONITEN) 10 MG tablet Take 1 tablet (10 mg total) by mouth 2 (two) times daily. Patient taking differently: Take 10 mg by mouth daily.  12/13/18 01/12/19 Yes Duke, Tami Lin, PA  potassium chloride (KLOR-CON) 20 MEQ packet Take 20 mEq by mouth daily. 11/30/18 01/11/19 Yes Antonieta Pert, MD  canagliflozin (INVOKANA) 100 MG TABS tablet Take 1 tablet (100 mg total) by mouth daily before breakfast. Patient not taking: Reported on 01/11/2019 08/17/17   Lenore Cordia, MD  metFORMIN (GLUCOPHAGE) 1000 MG tablet Take 1 tablet (1,000 mg total) by mouth 2 (two) times daily with a meal. IM program. Patient not taking: Reported on 01/11/2019 08/13/17 12/13/18  Molt, Romelle Starcher, DO    Family History Family History  Problem Relation Age of Onset   Hypertension Mother    Diabetes Mother    Heart disease Mother    Hypertension Father  Social History Social History   Tobacco Use   Smoking status: Former Smoker    Years: 4.00    Types: Cigars    Quit date: 2010    Years since quitting: 10.8   Smokeless tobacco: Never Used  Substance Use Topics   Alcohol use: No   Drug use: No     Allergies   Zithromax [azithromycin dihydrate] and Kiwi extract   Review of Systems Review of Systems  Unable to perform ROS: Acuity of condition  Constitutional: Positive for activity change.  Respiratory: Negative for shortness of breath.   Cardiovascular: Negative for chest pain.  Neurological: Positive for speech  difficulty, weakness and numbness.  Hematological: Does not bruise/bleed easily.     Physical Exam Updated Vital Signs BP (!) 169/112 (BP Location: Left Arm)    Pulse 95    Temp 98 F (36.7 C) (Oral)    Resp 16    Ht 5\' 9"  (1.753 m)    Wt 127 kg    SpO2 96%    BMI 41.35 kg/m   Physical Exam Vitals signs and nursing note reviewed.  Constitutional:      Appearance: He is well-developed.  HENT:     Head: Atraumatic.  Neck:     Musculoskeletal: Neck supple.  Cardiovascular:     Rate and Rhythm: Normal rate.  Pulmonary:     Effort: Pulmonary effort is normal.  Skin:    General: Skin is warm.  Neurological:     Mental Status: He is alert and oriented to person, place, and time.     Comments: Subjective numbness of the left face.  Patient has nasolabial flattening over the left side of the face along with facial droop. Patient has subjective weakness of the left upper extremity.  No pronation or drift appreciated in the strength for upper and lower extremities 4+ out of 5. Gross sensory exam for upper and lower extremities normal. Extraocular muscles are intact.  Gross exam of the visual fields is also normal      ED Treatments / Results  Labs (all labs ordered are listed, but only abnormal results are displayed) Labs Reviewed  COMPREHENSIVE METABOLIC PANEL - Abnormal; Notable for the following components:      Result Value   Glucose, Bld 206 (*)    BUN 22 (*)    Creatinine, Ser 2.18 (*)    Total Bilirubin 1.3 (*)    GFR calc non Af Amer 38 (*)    GFR calc Af Amer 44 (*)    All other components within normal limits  I-STAT CHEM 8, ED - Abnormal; Notable for the following components:   BUN 27 (*)    Creatinine, Ser 2.10 (*)    Glucose, Bld 207 (*)    All other components within normal limits  SARS CORONAVIRUS 2 (TAT 6-24 HRS)  PROTIME-INR  APTT  CBC  DIFFERENTIAL  CBG MONITORING, ED    EKG EKG Interpretation  Date/Time:  Wednesday January 11 2019 10:19:00  EST Ventricular Rate:  93 PR Interval:    QRS Duration: 93 QT Interval:  374 QTC Calculation: 466 R Axis:   -11 Text Interpretation: Sinus rhythm Left atrial enlargement Low voltage, extremity leads Abnormal R-wave progression, late transition No acute changes No significant change since last tracing Confirmed by Varney Biles [phone]) on 01/11/2019 11:19:58 AM   Radiology Ct Head Code Stroke Wo Contrast  Result Date: 01/11/2019 CLINICAL DATA:  Code stroke.  Stroke.  Left facial droop. EXAM:  CT HEAD WITHOUT CONTRAST TECHNIQUE: Contiguous axial images were obtained from the base of the skull through the vertex without intravenous contrast. COMPARISON:  CT head 08/11/2017 FINDINGS: Brain: Ill-defined areas of hypodensity in the right basal ganglia including the lenticular nucleus and internal capsule. These were not present previously and have the appearance of acute infarction. Small area of hypodensity in the left medial basal ganglia with not present previously and could represent infarction of subacute or chronic duration. This is more well-defined and lower density than the right basal ganglia lesions. Ventricle size normal.  No hemorrhage or mass. Vascular: Arteries are relatively dense diffusely compatible with high hematocrit. Question of hyperdensity in the right M2 branches best seen on sagittal and coronal reconstructions. Skull: Negative Sinuses/Orbits: Mild mucosal edema right maxillary sinus otherwise clear. Negative orbit Other: None ASPECTS ([place] Stroke Program Early CT Score) - Ganglionic level infarction (caudate, lentiform nuclei, internal capsule, insula, M1-M3 cortex): 5 - Supraganglionic infarction (M4-M6 cortex): 3 Total score (0-10 with 10 being normal): 8 IMPRESSION: 1. Hypodensities in the right basal ganglia suspicious for acute infarction. No associated hemorrhage 2. Questionable finding of hyperdense right MCA in the right M2 segment. This could be due to acute thrombus  however the vessels are relatively dense elsewhere due to high hematocrit. 3. Hypodensity left medial basal ganglia consistent with infarct, possibly chronic. 4. ASPECTS is 8 5. These results were called by telephone at the time of interpretation on 01/11/2019 at 10:09 am to provider A[place], who verbally acknowledged these results. Electronically Signed   By: Franchot Gallo M.D.   On: 01/11/2019 10:12    Procedures .Critical Care Performed by: Varney Biles, MD Authorized by: Varney Biles, MD   Critical care provider statement:    Critical care time (minutes):  32   Critical care was necessary to treat or prevent imminent or life-threatening deterioration of the following conditions:  CNS failure or compromise   Critical care was time spent personally by me on the following activities:  Discussions with consultants, evaluation of patient's response to treatment, examination of patient, ordering and performing treatments and interventions, ordering and review of laboratory studies, ordering and review of radiographic studies, pulse oximetry, re-evaluation of patient's condition, obtaining history from patient or surrogate and review of old charts   (including critical care time)  Medications Ordered in ED Medications  sodium chloride flush (NS) 0.9 % injection 3 mL (has no administration in time range)  aspirin chewable tablet 81 mg (has no administration in time range)  clopidogrel (PLAVIX) tablet 75 mg (has no administration in time range)  atorvastatin (LIPITOR) tablet 80 mg (has no administration in time range)     Initial Impression / Assessment and Plan / ED Course  I have reviewed the triage vital signs and the nursing notes.  Pertinent labs & imaging results that were available during my care of the patient were reviewed by me and considered in my medical decision making (see chart for details).  Clinical Course as of Jan 10 1338  Wed Jan 11, 2019  1337 Patient remains kind  of sluggish and somnolent. I was unable to get in touch with wife, neurology team trying to contact.  They will order the necessary MRI.  Medicine team will admit.   [AN]    Clinical Course User Index [AN] Varney Biles, MD       38 year old comes in a chief complaint of left-sided facial droop and weakness. Unclear last normal -patient reports last normal at 8:30  AM and also midnight.  He reports that he woke up feeling okay, but the wife noticed the abnormality and called for help.  Then he reports that he is clearly able to tell that he has some weakness on the left side.  He is not a good historian appears somnolent.  He denies any substance abuse.  wife likely to provide more meaningful history but not at bedside.  Code stroke called given at the very minimum there is worsening, evolving symptoms.  Patient has multiple stroke risk factors.  Final Clinical Impressions(s) / ED Diagnoses   Final diagnoses:  Ischemic stroke ([place])  Altered mental status, unspecified altered mental status type    ED Discharge Orders    None       Varney Biles, MD 01/11/19 [id number]    Varney Biles, MD 01/11/19 1339

## 2019-01-11 NOTE — ED Notes (Signed)
ED TO INPATIENT HANDOFF REPORT  ED Nurse Name and Phone #:   S Name/Age/Gender Harold Clements 38 y.o. male Room/Bed: TRAAC/TRAAC  Code Status   Code Status: Full Code  Home/SNF/Other Home HOME  Patient oriented to: self Is this baseline? Yes   Triage Complete: Triage complete  Chief Complaint CONFUSION  Triage Note No notes on file   Allergies Allergies  Allergen Reactions  . Zithromax [Azithromycin Dihydrate] Anaphylaxis  . Kiwi Extract Other (See Comments)    Tongue goes numb    Level of Care/Admitting Diagnosis ED Disposition    ED Disposition Condition [facility] Area: [place] [[id number]]  Level of Care: Telemetry Medical [104]  Covid Evaluation: Asymptomatic Screening Protocol (No Symptoms)  Diagnosis: Stroke [facility]) [id number]  Admitting Physician: Delice Bison [id number]  Attending Physician: Delice Bison [id number]  Estimated length of stay: past midnight tomorrow  Certification:: I certify this patient will need inpatient services for at least 2 midnights  PT Class (Do Not Modify): Inpatient [101]  PT Acc Code (Do Not Modify): Private [1]       B Medical/Surgery History Past Medical History:  Diagnosis Date  . 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 Diabetes mellitus without complication ([place])   . Ear pain 07/2017  . Hypertension   . Wasp sting 08/04/2016   of dorsum of left hand near thumb   No past surgical history on file.   A IV Location/Drains/Wounds Patient Lines/Drains/Airways Status   Active Line/Drains/Airways    Name:   Placement date:   Placement time:   Site:   Days:   Peripheral IV 01/11/19 Right Hand   01/11/19    1017    Hand   less than 1          Intake/Output Last 24 hours  Intake/Output Summary (Last 24 hours) at 01/11/2019 1952 Last data filed at 01/11/2019 1919 Gross per 24 hour  Intake 3 ml  Output -  Net 3 ml    Labs/Imaging Results for  orders placed or performed during the hospital encounter of 01/11/19 (from the past 48 hour(s))  Protime-INR     Status: None   Collection Time: 01/11/19  9:46 AM  Result Value Ref Range   Prothrombin Time 14.1 11.4 - 15.2 seconds   INR 1.1 0.8 - 1.2    Comment: (NOTE) INR goal varies based on device and disease states. Performed at [facility], [place] [address], [place], [place] [address]   APTT     Status: None   Collection Time: 01/11/19  9:46 AM  Result Value Ref Range   aPTT 29 24 - 36 seconds    Comment: Performed at [facility] [address], [place] [address]  CBC     Status: None   Collection Time: 01/11/19  9:46 AM  Result Value Ref Range   WBC 5.6 4.0 - 10.5 K/uL   RBC 5.73 4.22 - 5.81 MIL/uL   Hemoglobin 15.4 13.0 - 17.0 g/dL   HCT 46.1 39.0 - 52.0 %   MCV 80.5 80.0 - 100.0 fL   MCH 26.9 26.0 - 34.0 pg   MCHC 33.4 30.0 - 36.0 g/dL   RDW 12.8 11.5 - 15.5 %   Platelets 229 150 - 400 K/uL   nRBC 0.0 0.0 - 0.2 %    Comment: Performed at [facility], [place] [address],  [place], [place] [address]  Differential     Status: None   Collection Time: 01/11/19  9:46 AM  Result Value Ref Range   Neutrophils Relative % 63 %   Neutro Abs 3.5 1.7 - 7.7 K/uL   Lymphocytes Relative 26 %   Lymphs Abs 1.5 0.7 - 4.0 K/uL   Monocytes Relative 8 %   Monocytes Absolute 0.4 0.1 - 1.0 K/uL   Eosinophils Relative 2 %   Eosinophils Absolute 0.1 0.0 - 0.5 K/uL   Basophils Relative 1 %   Basophils Absolute 0.0 0.0 - 0.1 K/uL   Immature Granulocytes 0 %   Abs Immature Granulocytes 0.01 0.00 - 0.07 K/uL    Comment: Performed at [facility] [address], [place], [place] [address]  Comprehensive metabolic panel     Status: Abnormal   Collection Time: 01/11/19  9:46 AM  Result Value Ref Range   Sodium 141 135 - 145 mmol/L   Potassium 4.0 3.5 - 5.1 mmol/L   Chloride 108 98 - 111 mmol/L   CO2 23 22 - 32 mmol/L   Glucose, Bld 206 (H) 70 - 99 mg/dL    BUN 22 (H) 6 - 20 mg/dL   Creatinine, Ser 2.18 (H) 0.61 - 1.24 mg/dL   Calcium 9.5 8.9 - 10.3 mg/dL   Total Protein 8.1 6.5 - 8.1 g/dL   Albumin 3.9 3.5 - 5.0 g/dL   AST 17 15 - 41 U/L   ALT 13 0 - 44 U/L   Alkaline Phosphatase 71 38 - 126 U/L   Total Bilirubin 1.3 (H) 0.3 - 1.2 mg/dL   GFR calc non Af Amer 38 (L) >60 mL/min   GFR calc Af Amer 44 (L) >60 mL/min   Anion gap 10 5 - 15    Comment: Performed at [facility] [address] [place], [place] [address]  I-stat chem 8, ED     Status: Abnormal   Collection Time: 01/11/19 10:21 AM  Result Value Ref Range   Sodium 144 135 - 145 mmol/L   Potassium 4.1 3.5 - 5.1 mmol/L   Chloride 105 98 - 111 mmol/L   BUN 27 (H) 6 - 20 mg/dL   Creatinine, Ser 2.10 (H) 0.61 - 1.24 mg/dL   Glucose, Bld 207 (H) 70 - 99 mg/dL   Calcium, Ion 1.21 1.15 - 1.40 mmol/L   TCO2 26 22 - 32 mmol/L   Hemoglobin 15.6 13.0 - 17.0 g/dL   HCT 46.0 39.0 - 52.0 %  SARS CORONAVIRUS 2 (TAT 6-24 HRS) Nasopharyngeal Nasopharyngeal Swab     Status: None   Collection Time: 01/11/19 10:26 AM   Specimen: Nasopharyngeal Swab  Result Value Ref Range   SARS Coronavirus 2 NEGATIVE NEGATIVE    Comment: (NOTE) SARS-CoV-2 target nucleic acids are NOT DETECTED. The SARS-CoV-2 RNA is generally detectable in upper and lower respiratory specimens during the acute phase of infection. Negative results do not preclude SARS-CoV-2 infection, do not rule out co-infections with other pathogens, and should not be used as the sole basis for treatment or other patient management decisions. Negative results must be combined with clinical observations, patient history, and epidemiological information. The expected result is Negative. Fact Sheet for Patients: SugarRoll.be Fact Sheet for Healthcare Providers: https://www.woods-mathews.com/ This test is not yet approved or cleared by the [place] FDA and  has been authorized for  detection and/or diagnosis of SARS-CoV-2 by FDA under an Emergency Use Authorization (EUA). This EUA will remain  in effect (meaning this test can be used) for the duration of the COVID-19 declaration under Section 56 4(b)(1) of the Act, 21 U.S.C. section 360bbb-3(b)(1), unless the authorization is terminated or revoked sooner. Performed at [facility], [place] [address] [place], [place] [address]   CBG monitoring, ED     Status: Abnormal   Collection Time: 01/11/19  7:09 PM  Result Value Ref Range   Glucose-Capillary 142 (H) 70 - 99 mg/dL   Mr Angio Head Wo Contrast  Result Date: 01/11/2019 CLINICAL DATA:  Left facial and weakness EXAM: MRI HEAD WITHOUT CONTRAST MRA HEAD WITHOUT CONTRAST MRA NECK WITHOUT CONTRAST TECHNIQUE: Multiplanar, multiecho pulse sequences of the brain and surrounding structures were obtained without intravenous contrast. Angiographic images of the Circle of Willis were obtained using MRA technique without intravenous contrast. Angiographic images of the neck were obtained using MRA technique without intravenous contrast. Carotid stenosis measurements (when applicable) are obtained utilizing NASCET criteria, using the distal internal carotid diameter as the denominator. COMPARISON:  None. Correlation made with same day CT FINDINGS: MRI HEAD FINDINGS Motion artifact is present. Brain: There are multiple areas of restricted diffusion involving the right basal ganglia and adjacent white matter. This corresponds to CT finding. No associated hemorrhage. Additional mild patchy T2 hyperintensity in the supratentorial white matter is nonspecific but likely reflects mild chronic microvascular ischemic changes. There is a chronic small vessel infarct of the anterior left thalamus. Punctate focus of susceptibility hypointensity is present in the left parietal white matter compatible with chronic microhemorrhage or mineralization. Ventricles and sulci are within normal limits in  size and configuration. There is no intracranial mass, significant mass effect, hydrocephalus, or extra-axial fluid collection. Vascular: Major vessel flow voids at the skull base are preserved. Skull and upper cervical spine: Marrow signal is within normal limits. Paranasal sinuses and Orbits: Minor paranasal sinus mucosal thickening. Orbits are unremarkable. Other: Mastoid air cells are clear. MRA HEAD FINDINGS Intracranial internal carotid arteries, anterior cerebral, and middle cerebral arteries are patent. Bilateral posterior communicating arteries are present. Intracranial vertebral, basilar, and posterior cerebral arteries are patent. There is no significant stenosis or aneurysm. MRA NECK FINDINGS Motion artifact is present. Common, internal, and external carotid arteries are patent. There is no hemodynamically significant stenosis at the ICA origins. Codominant extracranial vertebral arteries are patent, noting that the origins are poorly evaluated due to artifact. IMPRESSION: Acute infarctions involving the right basal ganglia and adjacent white matter. Mild chronic microvascular ischemic changes. Small chronic infarct of the left thalamus. No hemodynamically significant stenosis. Electronically Signed   By: Macy Mis M.D.   On: 01/11/2019 17:20   Mr Angio Neck Wo Contrast  Result Date: 01/11/2019 CLINICAL DATA:  Left facial and weakness EXAM: MRI HEAD WITHOUT CONTRAST MRA HEAD WITHOUT CONTRAST MRA NECK WITHOUT CONTRAST TECHNIQUE: Multiplanar, multiecho pulse sequences of the brain and surrounding structures were obtained without intravenous contrast. Angiographic images of the Circle of Willis were obtained using MRA technique without intravenous contrast. Angiographic images of the neck were obtained using MRA technique without intravenous contrast. Carotid stenosis measurements (when applicable) are obtained utilizing NASCET criteria, using the distal internal carotid diameter as the  denominator. COMPARISON:  None. Correlation made with same day CT FINDINGS: MRI HEAD FINDINGS Motion artifact is present. Brain: There are multiple areas of restricted diffusion involving the right basal ganglia and adjacent white matter. This corresponds to CT finding. No associated hemorrhage. Additional mild patchy T2 hyperintensity in the supratentorial white matter is  nonspecific but likely reflects mild chronic microvascular ischemic changes. There is a chronic small vessel infarct of the anterior left thalamus. Punctate focus of susceptibility hypointensity is present in the left parietal white matter compatible with chronic microhemorrhage or mineralization. Ventricles and sulci are within normal limits in size and configuration. There is no intracranial mass, significant mass effect, hydrocephalus, or extra-axial fluid collection. Vascular: Major vessel flow voids at the skull base are preserved. Skull and upper cervical spine: Marrow signal is within normal limits. Paranasal sinuses and Orbits: Minor paranasal sinus mucosal thickening. Orbits are unremarkable. Other: Mastoid air cells are clear. MRA HEAD FINDINGS Intracranial internal carotid arteries, anterior cerebral, and middle cerebral arteries are patent. Bilateral posterior communicating arteries are present. Intracranial vertebral, basilar, and posterior cerebral arteries are patent. There is no significant stenosis or aneurysm. MRA NECK FINDINGS Motion artifact is present. Common, internal, and external carotid arteries are patent. There is no hemodynamically significant stenosis at the ICA origins. Codominant extracranial vertebral arteries are patent, noting that the origins are poorly evaluated due to artifact. IMPRESSION: Acute infarctions involving the right basal ganglia and adjacent white matter. Mild chronic microvascular ischemic changes. Small chronic infarct of the left thalamus. No hemodynamically significant stenosis. Electronically  Signed   By: Macy Mis M.D.   On: 01/11/2019 17:20   Mr Brain Wo Contrast  Result Date: 01/11/2019 CLINICAL DATA:  Left facial and weakness EXAM: MRI HEAD WITHOUT CONTRAST MRA HEAD WITHOUT CONTRAST MRA NECK WITHOUT CONTRAST TECHNIQUE: Multiplanar, multiecho pulse sequences of the brain and surrounding structures were obtained without intravenous contrast. Angiographic images of the Circle of Willis were obtained using MRA technique without intravenous contrast. Angiographic images of the neck were obtained using MRA technique without intravenous contrast. Carotid stenosis measurements (when applicable) are obtained utilizing NASCET criteria, using the distal internal carotid diameter as the denominator. COMPARISON:  None. Correlation made with same day CT FINDINGS: MRI HEAD FINDINGS Motion artifact is present. Brain: There are multiple areas of restricted diffusion involving the right basal ganglia and adjacent white matter. This corresponds to CT finding. No associated hemorrhage. Additional mild patchy T2 hyperintensity in the supratentorial white matter is nonspecific but likely reflects mild chronic microvascular ischemic changes. There is a chronic small vessel infarct of the anterior left thalamus. Punctate focus of susceptibility hypointensity is present in the left parietal white matter compatible with chronic microhemorrhage or mineralization. Ventricles and sulci are within normal limits in size and configuration. There is no intracranial mass, significant mass effect, hydrocephalus, or extra-axial fluid collection. Vascular: Major vessel flow voids at the skull base are preserved. Skull and upper cervical spine: Marrow signal is within normal limits. Paranasal sinuses and Orbits: Minor paranasal sinus mucosal thickening. Orbits are unremarkable. Other: Mastoid air cells are clear. MRA HEAD FINDINGS Intracranial internal carotid arteries, anterior cerebral, and middle cerebral arteries are  patent. Bilateral posterior communicating arteries are present. Intracranial vertebral, basilar, and posterior cerebral arteries are patent. There is no significant stenosis or aneurysm. MRA NECK FINDINGS Motion artifact is present. Common, internal, and external carotid arteries are patent. There is no hemodynamically significant stenosis at the ICA origins. Codominant extracranial vertebral arteries are patent, noting that the origins are poorly evaluated due to artifact. IMPRESSION: Acute infarctions involving the right basal ganglia and adjacent white matter. Mild chronic microvascular ischemic changes. Small chronic infarct of the left thalamus. No hemodynamically significant stenosis. Electronically Signed   By: Macy Mis M.D.   On: 01/11/2019 17:20   Ct Head  Code Stroke Wo Contrast  Result Date: 01/11/2019 CLINICAL DATA:  Code stroke.  Stroke.  Left facial droop. EXAM: CT HEAD WITHOUT CONTRAST TECHNIQUE: Contiguous axial images were obtained from the base of the skull through the vertex without intravenous contrast. COMPARISON:  CT head 08/11/2017 FINDINGS: Brain: Ill-defined areas of hypodensity in the right basal ganglia including the lenticular nucleus and internal capsule. These were not present previously and have the appearance of acute infarction. Small area of hypodensity in the left medial basal ganglia with not present previously and could represent infarction of subacute or chronic duration. This is more well-defined and lower density than the right basal ganglia lesions. Ventricle size normal.  No hemorrhage or mass. Vascular: Arteries are relatively dense diffusely compatible with high hematocrit. Question of hyperdensity in the right M2 branches best seen on sagittal and coronal reconstructions. Skull: Negative Sinuses/Orbits: Mild mucosal edema right maxillary sinus otherwise clear. Negative orbit Other: None ASPECTS ([place] Stroke Program Early CT Score) - Ganglionic level  infarction (caudate, lentiform nuclei, internal capsule, insula, M1-M3 cortex): 5 - Supraganglionic infarction (M4-M6 cortex): 3 Total score (0-10 with 10 being normal): 8 IMPRESSION: 1. Hypodensities in the right basal ganglia suspicious for acute infarction. No associated hemorrhage 2. Questionable finding of hyperdense right MCA in the right M2 segment. This could be due to acute thrombus however the vessels are relatively dense elsewhere due to high hematocrit. 3. Hypodensity left medial basal ganglia consistent with infarct, possibly chronic. 4. ASPECTS is 8 5. These results were called by telephone at the time of interpretation on 01/11/2019 at 10:09 am to provider A[place]or, who verbally acknowledged these results. Electronically Signed   By: Franchot Gallo M.D.   On: 01/11/2019 10:12    Pending Labs Unresulted Labs (From admission, onward)    Start     Ordered   01/12/19 0500  Hemoglobin A1c  Tomorrow morning,   R     01/11/19 1341   01/12/19 0500  Lipid panel  Tomorrow morning,   R    Comments: Fasting    01/11/19 1341   01/12/19 [id number]  Basic metabolic panel  Tomorrow morning,   R     01/11/19 1426   01/11/19 1613  TSH  Once,   STAT    Question:  Specimen collection method  Answer:  IV Team   01/11/19 1612   01/11/19 1613  Ammonia  Once,   STAT    Question:  Specimen collection method  Answer:  IV Team   01/11/19 1612   01/11/19 1613  Urine rapid drug screen (hosp performed)  ONCE - STAT,   STAT     01/11/19 1612          Vitals/Pain Today's Vitals   01/11/19 1859 01/11/19 1920 01/11/19 1930 01/11/19 1945  BP:  (!) 172/113 (!) 177/118 (!) 157/118  Pulse:  84 85 88  Resp:  20 18   Temp:      TempSrc:      SpO2:  96% 97% 97%  Weight:      Height:      PainSc: 0-No pain       Isolation Precautions No active isolations  Medications Medications  aspirin chewable tablet 81 mg (81 mg Oral Not Given 01/11/19 1919)  clopidogrel (PLAVIX) tablet 75 mg (75 mg Oral Given  01/11/19 1901)  atorvastatin (LIPITOR) tablet 80 mg (has no administration in time range)  insulin glargine (LANTUS) injection 16 Units (has no administration in time range)  insulin  aspart (novoLOG) injection 5 Units (5 Units Subcutaneous Not Given 01/11/19 1925)   stroke: mapping our early stages of recovery book (has no administration in time range)  acetaminophen (TYLENOL) tablet 650 mg (has no administration in time range)    Or  acetaminophen (TYLENOL) 160 MG/5ML solution 650 mg (has no administration in time range)    Or  acetaminophen (TYLENOL) suppository 650 mg (has no administration in time range)  enoxaparin (LOVENOX) injection 40 mg (40 mg Subcutaneous Given 01/11/19 1903)  sodium chloride flush (NS) 0.9 % injection 3 mL (3 mLs Intravenous Given 01/11/19 1919)    Mobility walks Moderate fall risk   Focused Assessments Neuro Assessment Handoff:  Swallow screen pass? Yes    NIH Stroke Scale ( + Modified Stroke Scale Criteria)  Interval: Initial Level of Consciousness (1a.)   : Alert, keenly responsive LOC Questions (1b. )   +: Answers both questions correctly LOC Commands (1c. )   + : Performs both tasks correctly Best Gaze (2. )  +: Normal Visual (3. )  +: No visual loss Facial Palsy (4. )    : Normal symmetrical movements Motor Arm, Left (5a. )   +: No drift Motor Arm, Right (5b. )   +: No drift Motor Leg, Left (6a. )   +: No drift Motor Leg, Right (6b. )   +: No drift Limb Ataxia (7. ): Absent Sensory (8. )   +: Normal, no sensory loss Best Language (9. )   +: No aphasia Dysarthria (10. ): Normal Extinction/Inattention (11.)   +: No Abnormality Modified SS Total  +: 0 Complete NIHSS TOTAL: 0 Last date known well: 01/11/19 Last time known well: 0000 Neuro Assessment: Exceptions to WDL Neuro Checks:   Initial (01/11/19 1000)  Last Documented NIHSS Modified Score: 0 (01/11/19 1900) Has TPA been given? No If patient is a Neuro Trauma and patient is going to  OR before floor call report to [place] nurse: [phone] or [phone]     R Recommendations: See Admitting Provider Note  Report given to:   Additional Notes:

## 2019-01-11 NOTE — ED Notes (Addendum)
Patient has returned from MRI. Wife at bedside along with EEG monitor Claiborne Billings.

## 2019-01-11 NOTE — ED Notes (Signed)
Pt sleeping had to be awakened from sleep

## 2019-01-11 NOTE — Code Documentation (Signed)
38yo male arriving to [facility] via private vehicle at [phone]. Patient from home where he was LKW at midnight per patient. He states his wife brought him to the ED because he was experiencing shortness of breath. Upon further questioning he states his left arm is weak. Code stroke activated in the ED. Stroke team to the bedside in CT. CT completed. NIHSS 0, see documentation for details and code stroke times. No focal deficits appreciated on exam. No acute stroke treatment at this time due to being outside the window for treatment with tPA and no signs of LVO on exam. Patient to be admitted for stroke workup. Bedside handoff with ED RN Santiago Glad.

## 2019-01-11 NOTE — Progress Notes (Signed)
EEG Completed; Results Pending  

## 2019-01-11 NOTE — ED Notes (Signed)
Activated code stroke with doug from [facility]

## 2019-01-11 NOTE — ED Notes (Addendum)
[name].  [phone]-

## 2019-01-11 NOTE — Progress Notes (Signed)
  Echocardiogram 2D Echocardiogram has been performed.  Darlina Sicilian M 01/11/2019, 3:01 PM

## 2019-01-11 NOTE — H&P (Signed)
NAME:  EZKIEL PEDDIE, MRN:  [id number], DOB:  08-Nov-1980, LOS: 0 ADMISSION DATE:  01/11/2019, Primary: Patient, No Pcp Per  CHIEF COMPLAINT:  weakness  Medical Service: Internal Medicine Teaching Service         Attending Physician: Dr. Lucious Groves, DO    First Contact: Dr. Darrick Meigs Pager: [phone]  Second Contact: Dr. Koleen Distance Pager: [phone]       After Hours (After 5p/  First Contact Pager: [phone]  weekends / holidays): Second Contact Pager: [phone]    History of present illness   38 yo male with PMH of HTN, DM2, CKD2 who presented to the ED on 11/11for evaluation of facial droop and left sided weakness. LKN 11/10 around noon. Wife at bedsides adds that he has been having some nonspecific neurological changes over the past week as well. Denies recent fever, chills, headache. Does endorse chest pain over the past 2 weeks which was occurring primarily at night and intermittently throughout the day--not seemly to correlate with exertion. Upon arrival to the ED, stroke code was called and CT head suggests basal ganglia infarct. NIH 0. Given NIH score and LKN, no tPA administered. Of note, patient reports that his mother had a stroke in her late 92s. No other family history of heart or vascular disease. Endorses compliance with antihypertension medications. Very few missed doses over the year.   Past Medical History  Uncontrolled HTN DM 2 CKD2  Home Medications     Prior to Admission medications   Medication Sig Start Date End Date Taking? Authorizing Provider  amLODipine (NORVASC) 10 MG tablet Take 1 tablet (10 mg total) by mouth daily. 11/30/18 01/11/19 Yes Antonieta Pert, MD  atorvastatin (LIPITOR) 40 MG tablet Take 1 tablet (40 mg total) by mouth daily at 6 PM. 11/30/18 01/11/19 Yes Kc, Maren Beach, MD  carvedilol (COREG) 25 MG tablet Take 1 tablet (25 mg total) by mouth 2 (two) times daily with a meal. Patient taking differently: Take 12.5 mg by mouth 2 (two) times daily with a  meal.  12/13/18 01/12/19 Yes Duke, Tami Lin, PA  furosemide (LASIX) 40 MG tablet Take 1 tablet (40 mg total) by mouth daily. 12/01/18 01/11/19 Yes Antonieta Pert, MD  Insulin Isophane & Regular Human (NOVOLIN 70/30 FLEXPEN) (70-30) 100 UNIT/ML PEN Inject 26 Units into the skin 2 (two) times daily.   Yes [provider]  minoxidil (LONITEN) 10 MG tablet Take 1 tablet (10 mg total) by mouth 2 (two) times daily. Patient taking differently: Take 10 mg by mouth daily.  12/13/18 01/12/19 Yes Duke, Tami Lin, PA  potassium chloride (KLOR-CON) 20 MEQ packet Take 20 mEq by mouth daily. 11/30/18 01/11/19 Yes Antonieta Pert, MD  canagliflozin (INVOKANA) 100 MG TABS tablet Take 1 tablet (100 mg total) by mouth daily before breakfast. Patient not taking: Reported on 01/11/2019 08/17/17   Lenore Cordia, MD  metFORMIN (GLUCOPHAGE) 1000 MG tablet Take 1 tablet (1,000 mg total) by mouth 2 (two) times daily with a meal. IM program. Patient not taking: Reported on 01/11/2019 08/13/17 12/13/18  Molt, Bethany, DO    Allergies    Allergies as of 01/11/2019 - Review Complete 12/13/2018  Allergen Reaction Noted  . Zithromax [azithromycin dihydrate] Anaphylaxis 05/17/2011  . Kiwi extract Other (See Comments) 11/27/2018    Social History   reports that he quit smoking about 10 years ago. His smoking use included cigars. He quit after 4.00 years of use. He has never used smokeless tobacco. He reports that  he does not drink alcohol or use drugs.   Family History   His family history includes Diabetes in his mother; Heart disease in his mother; Hypertension in his father and mother.   ROS  Review of Systems  Constitutional: Positive for malaise/fatigue. Negative for chills and fever.  HENT: Negative for congestion, sinus pain and sore throat.   Eyes: Negative for blurred vision.  Respiratory: Negative for cough and shortness of breath.   Cardiovascular: Positive for chest pain and PND. Negative for  palpitations and orthopnea.  Gastrointestinal: Negative for abdominal pain, heartburn, nausea and vomiting.  Genitourinary: Negative for dysuria.  Musculoskeletal: Negative for myalgias and neck pain.  Skin: Negative for rash.  Neurological: Positive for speech change and focal weakness. Negative for dizziness, seizures, loss of consciousness and headaches.  Psychiatric/Behavioral: Negative for depression.    Objective   Blood pressure (!) 169/112, pulse 95, temperature 98 F (36.7 C), temperature source Oral, resp. rate 16, height 5\' 9"  (1.753 m), weight 127 kg, SpO2 96 %.    No intake or output data in the 24 hours ending 01/11/19 1503 Filed Weights   01/11/19 1018  Weight: 127 kg    Examination: GENERAL: in no acute distress HEENT: head atraumatic. No conjunctival injection. Nares patent.  CARDIAC: heart RRR. No peripheral edema.  PULMONARY: acyanotic. Lung sounds clear to auscultation. ABDOMEN: soft. bs active NEURO: a/o x3. PERRL. CN VII intact. Face symmetric. Tongue midline. Upper and lower extremity strength 5/5. Sensation intact. Cerebellar function appears intact. SKIN: no rash or lesions on limited exam  PSYCH:  Slow affect  Consults:  Neuro   Significant Diagnostic Tests:  EKG: reviewed. Atrial enlargement. No ST wave changes.  CT head: Ill-defined areas of hypodensity in the right basal ganglia including the lenticular nucleus and internal capsule.  hypodensity in the left medial basal ganglia with not present previously and could represent infarction of subacute or chronic duration. Questionable finding of hyperdense right MCA in the right M2 segment. This could be due to acute thrombus however the vessels are relatively dense elsewhere due to high hematocrit.  Labs    CBC Latest Ref Rng & Units 01/11/2019 01/11/2019 11/29/2018  WBC 4.0 - 10.5 K/uL - 5.6 6.4  Hemoglobin 13.0 - 17.0 g/dL 15.6 15.4 16.5  Hematocrit 39.0 - 52.0 % 46.0 46.1 48.7  Platelets 150 -  400 K/uL - 229 256   BMP Latest Ref Rng & Units 01/11/2019 01/11/2019 12/13/2018  Glucose 70 - 99 mg/dL 207(H) 206(H) 222(H)  BUN 6 - 20 mg/dL 27(H) 22(H) 25(H)  Creatinine 0.61 - 1.24 mg/dL 2.10(H) 2.18(H) 1.92(H)  BUN/Creat Ratio 9 - 20 - - 13  Sodium 135 - 145 mmol/L 144 141 139  Potassium 3.5 - 5.1 mmol/L 4.1 4.0 4.5  Chloride 98 - 111 mmol/L 105 108 101  CO2 22 - 32 mmol/L - 23 22  Calcium 8.9 - 10.3 mg/dL - 9.5 9.2     Summary  38 yo male with HTN, DM II, CKD 2 admitted to the internal medicine team for management of basal ganglia infarct.   Assessment & Plan:  Active Problems:   CVA (cerebral vascular accident) ([place])  CVA of basal ganglia. Outside window for tPA. NIH 0 on admission. PE suggests some cognitive slowing however appears that remainder of neuro function is intact. Plan A1c, lipids MRI/MRA Carotid duplex Echo  Statin, asprin, plavix Tele, neuro checks PT/OT/ST Allow for permissive htn for 24-48h with max of 220/120  HTN: hold  antihypertensives for permissive hypertension DM II. On 26U of 70/30 novolin, invokana 100mg , 1000mg  metformin bid at home. A1C pending. Plan: SSI + lantus 10U CKD 3. Appears stable at baseline. Will continue to monitor.   Best practice:  CODE STATUS: FULL Diet:NPO DVT for prophylaxis: eliquis Dispo: Admit patient to Inpatient with expected length of stay greater than 2 midnights.   Mitzi Hansen, MD INTERNAL MEDICINE RESIDENT PGY-1 01/11/19 3:03 PM

## 2019-01-11 NOTE — Consult Note (Addendum)
Neurology Consultation  Reason for Consult: Code stroke Referring Physician: Kathrynn Humble  CC: Left-sided weakness and facial droop on the left  History is obtained from: Patient and wife  HPI: Harold Clements is a 38 y.o. male with history of hypertension, diabetes, chronic kidney disease stage II.  Patient awoke this morning and wife noted that patient was felt to have a left facial droop, and patient subjectively states that he felt as though his left side was weak.  Patient was driven by car to the hospital.  At that time he was evaluated by ED physician who also felt he had a left facial droop however this had resolved by the time stroke team had evaluated patient.  Code stroke was initiated.  CT head was obtained.  Patient continued to have subjective left-sided weakness however on NIH stroke scale no weakness was appreciated. Outside window for tPA  In discussing with the wife she states that he has been very drowsy since Saturday.  Apparently went to bed at 2000 hrs. on Saturday and slept till 1500 hrs. on Sunday.  He then ate some food and then went right back to sleep.  Yesterday he was very sleepy and while he was driving he actually urinated on himself which she has never done before.  He has remained very very lethargic and sleeping significantly more than usual over the past 3 days.  She states he also has been very flat affect.  It was this morning that she noted the facial droop.  ED course  -CT head -Labs  Chart review-no previous neurological notes noted in epic  Work up that has been done: -CT head -Labs in the ER -Physical exam and NIH stroke scale  LKW: 12:00 on 01/10/2019 tpa given?: no, outside window  Premorbid modified Rankin scale (mRS): 0 NIH stroke score 0   Past Medical History:  Diagnosis Date  . 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 Diabetes mellitus without complication ([place])   . Ear pain 07/2017  . Hypertension   . Wasp sting  08/04/2016   of dorsum of left hand near thumb      Family History  Problem Relation Age of Onset  . Hypertension Mother   . Diabetes Mother   . Heart disease Mother   . Hypertension Father     Social History:   reports that he quit smoking about 10 years ago. His smoking use included cigars. He quit after 4.00 years of use. He has never used smokeless tobacco. He reports that he does not drink alcohol or use drugs.  Medications  Current Facility-Administered Medications:  .  sodium chloride flush (NS) 0.9 % injection 3 mL, 3 mL, Intravenous, Once, Nanavati, Ankit, MD  Current Outpatient Medications:  .  amLODipine (NORVASC) 10 MG tablet, Take 1 tablet (10 mg total) by mouth daily., Disp: 30 tablet, Rfl: 0 .  atorvastatin (LIPITOR) 40 MG tablet, Take 1 tablet (40 mg total) by mouth daily at 6 PM., Disp: 30 tablet, Rfl: 0 .  canagliflozin (INVOKANA) 100 MG TABS tablet, Take 1 tablet (100 mg total) by mouth daily before breakfast., Disp: 90 tablet, Rfl: 0 .  carvedilol (COREG) 25 MG tablet, Take 1 tablet (25 mg total) by mouth 2 (two) times daily with a meal., Disp: 60 tablet, Rfl: 1 .  furosemide (LASIX) 40 MG tablet, Take 1 tablet (40 mg total) by mouth daily., Disp: 30 tablet, Rfl: 0 .  Insulin Isophane & Regular Human (  NOVOLIN 70/30 FLEXPEN) (70-30) 100 UNIT/ML PEN, Inject 26 Units into the skin 2 (two) times daily., Disp: , Rfl:  .  metFORMIN (GLUCOPHAGE) 1000 MG tablet, Take 1 tablet (1,000 mg total) by mouth 2 (two) times daily with a meal. IM program., Disp: 180 tablet, Rfl: 0 .  minoxidil (LONITEN) 10 MG tablet, Take 1 tablet (10 mg total) by mouth 2 (two) times daily., Disp: 60 tablet, Rfl: 1 .  potassium chloride (KLOR-CON) 20 MEQ packet, Take 20 mEq by mouth daily., Disp: 30 packet, Rfl: 0   Exam: Current vital signs: BP (!) 169/112 (BP Location: Left Arm)   Pulse 95   Temp 98 F (36.7 C) (Oral)   Resp 16   Ht 5\' 9"  (1.753 m)   Wt 127 kg   SpO2 96%   BMI 41.35  kg/m  Vital signs in last 24 hours: Temp:  [98 F (36.7 C)] 98 F (36.7 C) (11/11 0935) Pulse Rate:  [95] 95 (11/11 0935) Resp:  [16] 16 (11/11 0935) BP: (169)/(112) 169/112 (11/11 0935) SpO2:  [96 %] 96 % (11/11 0935) Weight:  [127 kg] 127 kg (11/11 1018)  ROS:     General ROS: Positive for -generalized weakness Psychological ROS: negative for - behavioral disorder, hallucinations, memory difficulties, mood swings or suicidal ideation Ophthalmic ROS: negative for - blurry vision, double vision, eye pain or loss of vision ENT ROS: negative for - epistaxis, nasal discharge, oral lesions, sore throat, tinnitus or vertigo Respiratory ROS: negative for - cough, hemoptysis, shortness of breath or wheezing Cardiovascular ROS: negative for - chest pain, dyspnea on exertion, edema or irregular heartbeat Gastrointestinal ROS: negative for - abdominal pain, diarrhea, hematemesis, nausea/vomiting or stool incontinence Genito-Urinary ROS: negative for - dysuria, hematuria, incontinence or urinary frequency/urgency Musculoskeletal ROS: Positive for -  muscular weakness Neurological ROS: as noted in HPI Dermatological ROS: negative for rash and skin lesion changes   Physical Exam   Constitutional: Appears well-developed and well-nourished.  Psych: Affect appropriate to situation Eyes: No scleral injection HENT: No OP obstrucion Head: Normocephalic.  Cardiovascular: Normal rate and regular rhythm.  Respiratory: Effort normal, non-labored breathing GI: Soft.  No distension. There is no tenderness.  Skin: WDI  Neuro: Mental Status: Patient is awake, alert, oriented to person, place, month, year, and situation. Patient is able to give a clear and coherent history. No signs of aphasia or neglect Cranial Nerves: II: Visual Fields are full.  III,IV, VI: EOMI without ptosis or diploplia. Pupils equal, round and reactive to light V: Facial sensation is symmetric to temperature VII: Facial  movement is symmetric.  VIII: hearing is intact to voice X: Palat elevates symmetrically XI: Shoulder shrug is symmetric. XII: tongue is midline without atrophy or fasciculations.  Motor: Tone is normal. Bulk is normal. 5/5 strength was present in all four extremities.  Sensory: Sensation is symmetric to light touch and temperature in the arms and legs. Deep Tendon Reflexes: 2+ and symmetric in the biceps and patellae.  Plantars: Toes are downgoing bilaterally.  Cerebellar: FNF and HKS are intact bilaterally    Labs I have reviewed labs in epic and the results pertinent to this consultation are:   CBC    Component Value Date/Time   WBC 6.4 11/29/2018 0405   RBC 6.02 (H) 11/29/2018 0405   HGB 16.5 11/29/2018 0405   HCT 48.7 11/29/2018 0405   PLT 256 11/29/2018 0405   MCV 80.9 11/29/2018 0405   MCH 27.4 11/29/2018 0405   MCHC 33.9  11/29/2018 0405   RDW 13.3 11/29/2018 0405   LYMPHSABS 1.8 03/29/2018 1110   MONOABS 0.5 03/29/2018 1110   EOSABS 0.1 03/29/2018 1110   BASOSABS 0.0 03/29/2018 1110    CMP     Component Value Date/Time   NA 139 12/13/2018 0950   K 4.5 12/13/2018 0950   CL 101 12/13/2018 0950   CO2 22 12/13/2018 0950   GLUCOSE 222 (H) 12/13/2018 0950   GLUCOSE 87 11/30/2018 0538   BUN 25 (H) 12/13/2018 0950   CREATININE 1.92 (H) 12/13/2018 0950   CALCIUM 9.2 12/13/2018 0950   PROT 9.0 (H) 09/28/2013 2049   ALBUMIN 4.1 09/28/2013 2049   AST 16 09/28/2013 2049   ALT 15 09/28/2013 2049   ALKPHOS 67 09/28/2013 2049   BILITOT 1.5 (H) 09/28/2013 2049   GFRNONAA 44 (L) 12/13/2018 0950   GFRAA 51 (L) 12/13/2018 0950    Lipid Panel     Component Value Date/Time   CHOL 163 11/28/2018 0952   TRIG 133 11/28/2018 0952   HDL 31 (L) 11/28/2018 0952   CHOLHDL 5.3 11/28/2018 0952   VLDL 27 11/28/2018 0952   LDLCALC 105 (H) 11/28/2018 0952     Imaging I have reviewed the images obtained:  CT-scan of the brain-hypodensities in the right basal ganglia  suspicious for acute infarct.  No associated hemorrhage.  Questionable findings of hyperdense right MCA in the M2 segment.  This could be due to acute thrombus however the vessels are relatively dense elsewhere due to high hematocrit.  Hypodensity left medial basal ganglia consistent with infarct which would be possibly chronic.  MRI examination of the brain-pending  Etta Quill PA-C [facility] [phone]  M-F  (9:00 am- 5:00 PM)  01/11/2019, 10:22 AM     Assessment:  38 year old male with multiple stroke risk factors presenting to hospital as code stroke with symptoms of left-sided weakness and left facial droop.  On exam left facial droop had resolved and NIH stroke scale was 0. CT scan did show hypodensities in the right basal ganglia suspicious for acute infarct.  Assessment at this time is possible right-sided basal ganglia acute infarct.  Due to NIH stroke scale of 0 And minimal symptoms TPA was not given.   In addition patient has been very lethargic for the last three days with no clear etiology. Will need to look into metabolic possibilities. Covid often times can present in odd symptoms but test results in hospital show he is Covid negative.   Impression: -Small vessel stroke -Left-sided weakness - prolonged lethargy  Recommend # MRI of the brain without contrast #MRA Head  #Carotid Dopplers bilateral #Transthoracic Echo,  # Start patient on ASA 81mg  and plavix 75mg  daily  #Increase atorvastatin 80 mg/other high intensity statin # BP goal: permissive HTN upto 220/120 mmHg # HBAIC and Lipid profile # Telemetry monitoring # Frequent neuro checks # NPO until passes stroke swallow screen  In addition  -TSH -Ammonia -UDS -EEG  # please page stroke NP  Or  PA  Or MD from 8am -4 pm  as this patient from this time will be  followed by the stroke.   You can look them up on [email or web address]  Password [email or web address]   NEUROHOSPITALIST ADDENDUM Performed a face to face  diagnostic evaluation.   I have reviewed the contents of history and physical exam as documented by PA/ARNP/Resident and agree with above documentation.  I have discussed and formulated the above plan as documented. Edits to the note have  been made as needed.  38 year old male with history of hypertension, diabetes CKD stage II presents to the emergency department with sudden onset left facial droop and subjective left-sided weakness.  Symptoms have improved. t. On speaking to the patient's wife, she states that her symptoms actually started since Saturday and he has been extremely drowsy.  She also states that his left arm and face appeared slightly swollen.  This morning however is when she noted facial droop decided to bring to the ER.   CT head shows a small right basal ganglia hypodensity that could be suggestive of acute infarct. tPA was not administered as patient is outside the window and code stroke was canceled.  MRI brain was obtained, showed acute infarctions in the right basal ganglia and right parietal white matter, reviewed MRA:  Right MCA stenosis (not mentioned in report).  Carotids open on MRA neck. Given fairly large size of subcortical infarct, would suspect.  parent vessel disease rather than small vessel stroke,howhowever still a possibility.  Consider obtaining TCD to evaluate for right MCA stenosis.  Patient is young for a stroke, would normally have considered hypercoagulable work-up/vasculaitis/PFO but given patient's multiple stroke risk factors including insulin-dependent diabetes mellitus-appears less likely to be disease  Agree with dual antiplatelet therapy.  Patient started on enoxaparin for DVT prophylaxis which is appropriate (Eliquis mentioned in H&P?? likely typo).     Karena Addison Jettie Lazare MD [facility] [phone]   If 7pm to 7am, please call on call as listed on AMION.

## 2019-01-12 ENCOUNTER — Inpatient Hospital Stay (HOSPITAL_COMMUNITY): Payer: Medicaid Other

## 2019-01-12 ENCOUNTER — Encounter (HOSPITAL_COMMUNITY): Payer: Self-pay

## 2019-01-12 DIAGNOSIS — I119 Hypertensive heart disease without heart failure: Secondary | ICD-10-CM | POA: Diagnosis present

## 2019-01-12 DIAGNOSIS — N183 Chronic kidney disease, stage 3 unspecified: Secondary | ICD-10-CM

## 2019-01-12 DIAGNOSIS — R29810 Facial weakness: Secondary | ICD-10-CM

## 2019-01-12 DIAGNOSIS — I6359 Cerebral infarction due to unspecified occlusion or stenosis of other cerebral artery: Secondary | ICD-10-CM

## 2019-01-12 DIAGNOSIS — I5032 Chronic diastolic (congestive) heart failure: Secondary | ICD-10-CM

## 2019-01-12 DIAGNOSIS — E1122 Type 2 diabetes mellitus with diabetic chronic kidney disease: Secondary | ICD-10-CM

## 2019-01-12 DIAGNOSIS — Z79899 Other long term (current) drug therapy: Secondary | ICD-10-CM

## 2019-01-12 DIAGNOSIS — R32 Unspecified urinary incontinence: Secondary | ICD-10-CM

## 2019-01-12 DIAGNOSIS — I130 Hypertensive heart and chronic kidney disease with heart failure and stage 1 through stage 4 chronic kidney disease, or unspecified chronic kidney disease: Secondary | ICD-10-CM

## 2019-01-12 DIAGNOSIS — R4182 Altered mental status, unspecified: Secondary | ICD-10-CM

## 2019-01-12 DIAGNOSIS — G8194 Hemiplegia, unspecified affecting left nondominant side: Secondary | ICD-10-CM

## 2019-01-12 DIAGNOSIS — Z9114 Patient's other noncompliance with medication regimen: Secondary | ICD-10-CM

## 2019-01-12 LAB — BASIC METABOLIC PANEL
Anion gap: 10 (ref 5–15)
BUN: 20 mg/dL (ref 6–20)
CO2: 25 mmol/L (ref 22–32)
Calcium: 9.6 mg/dL (ref 8.9–10.3)
Chloride: 109 mmol/L (ref 98–111)
Creatinine, Ser: 2.02 mg/dL — ABNORMAL HIGH (ref 0.61–1.24)
GFR calc Af Amer: 48 mL/min — ABNORMAL LOW (ref >60)
GFR calc non Af Amer: 41 mL/min — ABNORMAL LOW (ref >60)
Glucose, Bld: 121 mg/dL — ABNORMAL HIGH (ref 70–99)
Potassium: 3.4 mmol/L — ABNORMAL LOW (ref 3.5–5.1)
Sodium: 144 mmol/L (ref 135–145)

## 2019-01-12 LAB — GLUCOSE, CAPILLARY
Glucose-Capillary: 119 mg/dL — ABNORMAL HIGH (ref 70–99)
Glucose-Capillary: 129 mg/dL — ABNORMAL HIGH (ref 70–99)
Glucose-Capillary: 158 mg/dL — ABNORMAL HIGH (ref 70–99)
Glucose-Capillary: 99 mg/dL (ref 70–99)

## 2019-01-12 LAB — LIPID PANEL
Cholesterol: 134 mg/dL (ref 0–200)
HDL: 32 mg/dL — ABNORMAL LOW (ref >40)
LDL Cholesterol: 78 mg/dL (ref 0–99)
Total CHOL/HDL Ratio: 4.2 RATIO
Triglycerides: 118 mg/dL (ref <150)
VLDL: 24 mg/dL (ref 0–40)

## 2019-01-12 LAB — HEMOGLOBIN A1C
Hgb A1c MFr Bld: 8.8 % — ABNORMAL HIGH (ref 4.8–5.6)
Mean Plasma Glucose: 205.86 mg/dL

## 2019-01-12 LAB — SEDIMENTATION RATE: Sed Rate: 22 mm/hr — ABNORMAL HIGH (ref 0–16)

## 2019-01-12 MED ORDER — CARVEDILOL 12.5 MG PO TABS
25.0000 mg | ORAL_TABLET | Freq: Two times a day (BID) | ORAL | Status: DC
  Administered 2019-01-12 – 2019-01-13 (×2): 25 mg via ORAL
  Filled 2019-01-12 (×2): qty 2

## 2019-01-12 MED ORDER — ENOXAPARIN SODIUM 60 MG/0.6ML ~~LOC~~ SOLN
60.0000 mg | SUBCUTANEOUS | Status: DC
  Administered 2019-01-12 – 2019-01-13 (×2): 60 mg via SUBCUTANEOUS
  Filled 2019-01-12 (×2): qty 0.6

## 2019-01-12 MED ORDER — AMLODIPINE BESYLATE 10 MG PO TABS
10.0000 mg | ORAL_TABLET | Freq: Every day | ORAL | Status: DC
  Administered 2019-01-12 – 2019-01-13 (×2): 10 mg via ORAL
  Filled 2019-01-12 (×2): qty 1

## 2019-01-12 NOTE — Evaluation (Signed)
Physical Therapy Evaluation Patient Details Name: Harold Clements MRN: [id number] DOB: [birth year] Today's Date: 01/12/2019   History of Present Illness  38 yo admitted with left weakness, facial droop, drowsiness with Rt basal ganglia infarct out of window for tPA. PMhx: CKD, DM, HTN  Clinical Impression  Pt lethargic on arrival but able to awaken with continued stimuli, slow to respond and at times only shaking his head. Pt with varied responses of how many kids he has (1-2), how [place] he has been at his current job (2 weeks vs 2 years) and stating situation related to an accident. Pt with good strength and balance for gait assessed but incontinent of urine and having difficulty reading particularly lower right section of signage in room. Pt with above and below deficits who will benefit from acute therapy to maximize mobility, function and safety to decrease burden of care.     Follow Up Recommendations No PT follow up;Outpatient PT(pending further balance assessment)    Equipment Recommendations  None recommended by PT    Recommendations for Other Services       Precautions / Restrictions Precautions Precaution Comments: urinary incontinence Restrictions Weight Bearing Restrictions: No      Mobility  Bed Mobility Overal bed mobility: Needs Assistance Bed Mobility: Supine to Sit     Supine to sit: Min assist     General bed mobility comments: min assist to fully elevate trunk from surface, increased time and cues to initiate  Transfers Overall transfer level: Needs assistance   Transfers: Sit to/from Stand Sit to Stand: Supervision         General transfer comment: supervision for safety with pt able to stand from bed and chair without physical assist  Ambulation/Gait Ambulation/Gait assistance: Min guard Gait Distance (Feet): 250 Feet Assistive device: None Gait Pattern/deviations: Step-through pattern;Decreased stride length   Gait velocity interpretation:  >2.62 ft/sec, indicative of community ambulatory General Gait Details: pt with decreased stride but steady gait. Pt with incontinence during gait and condom cath leaking which limited session, gait challenges and further balance assessment. Pt seated in hall to clean legs, floor and don new socks prior to return to room  Stairs            Wheelchair Mobility    Modified Rankin (Stroke Patients Only) Modified Rankin (Stroke Patients Only) Pre-Morbid Rankin Score: No symptoms Modified Rankin: Moderate disability     Balance Overall balance assessment: No apparent balance deficits (not formally assessed)                                           Pertinent Vitals/Pain Pain Assessment: No/denies pain    Home Living Family/patient expects to be discharged to:: Private residence Living Arrangements: Spouse/significant other;Children Available Help at Discharge: Family;Available 24 hours/day Type of Home: Mobile home Home Access: Stairs to enter   Entrance Stairs-Number of Steps: 3 Home Layout: One level Home Equipment: None      Prior Function Level of Independence: Independent         Comments: works at [employer]. 2 kids 34yo and 58yo     Hand Dominance        Extremity/Trunk Assessment   Upper Extremity Assessment Upper Extremity Assessment: Overall WFL for tasks assessed    Lower Extremity Assessment Lower Extremity Assessment: Overall WFL for tasks assessed(strength 5/5 with intact sensation, coordination not tested)  Cervical / Trunk Assessment Cervical / Trunk Assessment: Normal  Communication   Communication: No difficulties  Cognition Arousal/Alertness: Lethargic(initially lethargic able to arouse with stimulation) Behavior During Therapy: Flat affect Overall Cognitive Status: Impaired/Different from baseline Area of Impairment: Orientation;Safety/judgement;Problem solving                 Orientation  Level: Situation       Safety/Judgement: Decreased awareness of safety;Decreased awareness of deficits   Problem Solving: Slow processing General Comments: pt with limited awareness of need to void, condom cath leaking and pt unable to state a problem. Pt stating he is in the hospital due to an accident but able to recall stroke end of session. Initially stating he has 2 kids then 1 then provided ages for 2 children. Unable to read words on bottom right of sign in room but could read top and left      General Comments      Exercises     Assessment/Plan    PT Assessment Patient needs continued PT services  PT Problem List Decreased mobility;Decreased cognition;Decreased activity tolerance       PT Treatment Interventions Therapeutic exercise;Balance training;Gait training;Stair training;Functional mobility training;Cognitive remediation;Therapeutic activities;Patient/family education    PT Goals (Current goals can be found in the Care Plan section)  Acute Rehab PT Goals Patient Stated Goal: return to work PT Goal Formulation: With patient Time For Goal Achievement: 01/19/19 Potential to Achieve Goals: Good    Frequency Min 4X/week   Barriers to discharge        Co-evaluation               AM-PAC PT "6 Clicks" Mobility  Outcome Measure Help needed turning from your back to your side while in a flat bed without using bedrails?: None Help needed moving from lying on your back to sitting on the side of a flat bed without using bedrails?: A Little Help needed moving to and from a bed to a chair (including a wheelchair)?: A Little Help needed standing up from a chair using your arms (e.g., wheelchair or bedside chair)?: A Little Help needed to walk in hospital room?: A Little Help needed climbing 3-5 steps with a railing? : A Little 6 Click Score: 19    End of Session Equipment Utilized During Treatment: Gait belt Activity Tolerance: Patient tolerated treatment  well Patient left: in chair;with call bell/phone within reach;with chair alarm set Nurse Communication: Mobility status PT Visit Diagnosis: Other abnormalities of gait and mobility (R26.89)    Time: [id number] PT Time Calculation (min) (ACUTE ONLY): 37 min   Charges:   PT Evaluation $PT Eval Moderate Complexity: 1 Mod PT Treatments $Therapeutic Activity: 8-22 mins        Sharicka Pogorzelski P, PT Acute Rehabilitation Services Pager: [phone] Office: [place] 01/12/2019, 11:17 AM

## 2019-01-12 NOTE — Progress Notes (Signed)
NAME:  LILLIAN BENNE, MRN:  [id number], DOB:  [birth year], LOS: 1 ADMISSION DATE:  01/11/2019,  Primary: Patient, No Pcp Per  CHIEF COMPLAINT:  weakness  Brief History  38 yo male with pmh of hypertension, DM 2, and CKD 3 who presented to ED on 11/11 for evaluation of 2d hx of weakness and altered mental status and was subsequently found to have right basal ganglia infarcts. No tPA was administered as sx had resolved and LKN was approx 24h.  Subjective  No overnight events Echo significant for EF 40-45%  Objective   Blood pressure (!) 155/89, pulse 88, temperature 98.3 F (36.8 C), temperature source Oral, resp. rate 18, height 5\' 9"  (1.753 m), weight 118.5 kg, SpO2 99 %.     Examination: General: No acute distress Cardiac: Regular rate and rhythm.  No peripheral edema Pulmonary: Lung sounds clear to auscultation Abdomen: Soft nontender Neuro: Alert oriented.  EOMs intact.  Pupils equal round and reactive to light.  Face symmetric.  Motor strength intact of bilateral upper and lower extremities. Skin: No rash  Consults:  Neuro  Significant Diagnostic Tests:  CT head: Ill-defined areas of hypodensity in the right basal ganglia including the lenticular nucleus and internal capsule.  hypodensity in the left medial basal ganglia with not present previously and could represent infarction of subacute or chronic duration.  MRI/MRA head and neck: vasculature patent. No significant stenosis. Chronic microvascular ischemic changes. Chronic small vessel infarct of the anterior left thalmus.multiple areas of restricted diffusing involving right basal ganglia and adjacent white matter  Echo: EF 40-45% with mild to moderate global LV hypokinesis. Grade one diastolic dysfunction. Trace mitral valve regurg. Remainder unremarkable.  Labs     BMP Latest Ref Rng & Units 01/12/2019 01/11/2019 01/11/2019  Glucose 70 - 99 mg/dL 121(H) 207(H) 206(H)  BUN 6 - 20 mg/dL 20 27(H) 22(H)  Creatinine 0.61  - 1.24 mg/dL 2.02(H) 2.10(H) 2.18(H)  BUN/Creat Ratio 9 - 20 - - -  Sodium 135 - 145 mmol/L 144 144 141  Potassium 3.5 - 5.1 mmol/L 3.4(L) 4.1 4.0  Chloride 98 - 111 mmol/L 109 105 108  CO2 22 - 32 mmol/L 25 - 23  Calcium 8.9 - 10.3 mg/dL 9.6 - 9.5   A1C 8.8  Lipid Panel     Component Value Date/Time   CHOL 134 01/12/2019 0339   TRIG 118 01/12/2019 0339   HDL 32 (L) 01/12/2019 0339   CHOLHDL 4.2 01/12/2019 0339   VLDL 24 01/12/2019 0339   LDLCALC 78 01/12/2019 0339   Summary  38 yo male with hypertension, DM II, and CKD III admitted to the internal medicine service for right basal ganglia CVA.   Assessment & Plan:  Active Problems:   Hypertension associated with diabetes ([place])   Diabetes ([place])   Ischemic stroke ([place])   Stroke ([place])  Right basal ganglia CVA. Risk factors significant for DM and hypertension. FH significant for mother with stroke at age 41.  Echo without evidence for cardiogenic source. Carotid duplex pending Plan Aspirin, statin, plavix PT/OT/ST 24-48h permissive hypertension up to 220/120. Resume home medications after period has passed Tele, neurochecks  Hypertension. Allow for permissive htn DM type 2. A1C 8.8. continue lantus and SSI HFrEF. Echo on admission significant for EF 40-45% with global LV hypokinesis.  No signs of fluid overload at this time. Continue to monitor. Cardiology referral outpatient. CKD III. Renal function stable. Continue to monitor  Best practice:  CODE STATUS: FULL Diet: NPO  until passes swallow screen DVT for prophylaxis: lovenox Dispo: pending PT recs  Mitzi Hansen, MD INTERNAL MEDICINE RESIDENT PGY-1 PAGER #: [phone] 01/12/19 5:21 AM

## 2019-01-12 NOTE — Progress Notes (Signed)
OT Cancellation Note  Patient Details Name: TREMEL FOLKERTS MRN: [id number] DOB: 01-08-1981   Cancelled Treatment:    Reason Eval/Treat Not Completed: Other (comment) Pt not arousing from sleep or able to keep eyes open despite persistent alerting activities. Pt then getting echo. Will continue to check back as available and appropriate to initiate OT POC.  Zenovia Jarred, MSOT, OTR/L Behavioral Health OT/ Acute Relief OT [facility] Office: [place] 01/12/2019, 10:31 AM

## 2019-01-12 NOTE — Progress Notes (Signed)
Carotid duplex       has been completed. Preliminary results can be found under CV proc through chart review. Kayron Hicklin, BS, RDMS, RVT   

## 2019-01-12 NOTE — Procedures (Addendum)
Patient Name: Harold Clements  MRN: [id number]  Epilepsy Attending: Lora Havens  Referring Physician/Provider: Etta Quill, PA Date: 01/12/2019 Duration: 23.43 minutes  Patient history: 38 year old male who presented with left-sided weakness and left-sided facial droop.  EEG to evaluate for seizures.  Level of alertness: Awake, asleep  AEDs during EEG study: None  Technical aspects: This EEG study was done with scalp electrodes positioned according to the 10-20 International system of electrode placement. Electrical activity was acquired at a sampling rate of 500Hz  and reviewed with a high frequency filter of 70Hz  and a low frequency filter of 1Hz . EEG data were recorded continuously and digitally stored.   DESCRIPTION: During awake state, the posterior dominant rhythm consists of 8 Hz activity of moderate voltage (25-35 uV) seen predominantly in posterior head regions, symmetric and reactive to eye opening and eye closing.  Sleep was characterized by diffuse 3 to 5 Hz theta-delta slowing. Hyperventilation and photic stimulation were not performed.  IMPRESSION: This study is within normal limits. No seizures or epileptiform discharges were seen throughout the recording.  [name] Barbra Sarks

## 2019-01-12 NOTE — Progress Notes (Signed)
Patient was asleep.  The chaplain will attempt to follow-up later in the day.  Brion Aliment Chaplain Resident For questions concerning this note please contact me by pager [phone]

## 2019-01-12 NOTE — Progress Notes (Signed)
Chaplain followed-up with the patient.  The patient was still very lethargic.  The chaplain let the patient know they are there to support them.  The chaplain will follow-up as needed.  Brion Aliment Chaplain Resident For questions concerning this note please contact me by pager [phone]

## 2019-01-12 NOTE — Progress Notes (Signed)
TCD bubble completed.  Oda Cogan, RDMS, RVT June Leap, BS, RDMS, RVT

## 2019-01-12 NOTE — Progress Notes (Signed)
STROKE TEAM PROGRESS NOTE   INTERVAL HISTORY I have personally reviewed history of presenting illness with the patient, electronic medical records and imaging films in PACS.  Patient is sleepy but can be aroused and follows simple commands and speaks few words.  Mild dysarthria and left facial droop noted.  MRI scan of the brain shows large right basal ganglia infarct and MRA shows focal signal drop in the right MCA with no significant stenosis.  Echocardiogram shows diminished ejection fraction of 40 to 45%.  Patient denies history of any significant cardiac disease, palpitations or atrial fibrillation.  Vitals:   01/11/19 1945 01/11/19 2039 01/11/19 2346 01/12/19 0300  BP: (!) 157/118 (!) 172/109 (!) 160/80 (!) 155/89  Pulse: 88 83 80 88  Resp:  18  18  Temp:  (!) 97.5 F (36.4 C) 98 F (36.7 C) 98.3 F (36.8 C)  TempSrc:  Oral Oral Oral  SpO2: 97% 100% 99% 99%  Weight:  118.5 kg    Height:  _0  (1.753 m)      CBC:  Recent Labs  Lab 01/11/19 0946 01/11/19 1021  WBC 5.6  --   NEUTROABS 3.5  --   HGB 15.4 15.6  HCT 46.1 46.0  MCV 80.5  --   PLT 229  --     Basic Metabolic Panel:  Recent Labs  Lab 01/11/19 0946 01/11/19 1021 01/12/19 0339  NA 141 144 144  K 4.0 4.1 3.4*  CL 108 105 109  CO2 23  --  25  GLUCOSE 206* 207* 121*  BUN 22* 27* 20  CREATININE 2.18* 2.10* 2.02*  CALCIUM 9.5  --  9.6   Lipid Panel:     Component Value Date/Time   CHOL 134 01/12/2019 0339   TRIG 118 01/12/2019 0339   HDL 32 (L) 01/12/2019 0339   CHOLHDL 4.2 01/12/2019 0339   VLDL 24 01/12/2019 0339   LDLCALC 78 01/12/2019 0339   HgbA1c:  Lab Results  Component Value Date   HGBA1C 8.8 (H) 01/12/2019   Urine Drug Screen:     Component Value Date/Time   LABOPIA NONE DETECTED 11/27/2018 0830   COCAINSCRNUR NONE DETECTED 11/27/2018 0830   LABBENZ NONE DETECTED 11/27/2018 0830   AMPHETMU NONE DETECTED 11/27/2018 0830   THCU NONE DETECTED 11/27/2018 0830   LABBARB NONE DETECTED  11/27/2018 0830    Alcohol Level No results found for: [facility]  IMAGING Mr Angio Head Wo Contrast  Result Date: 01/11/2019 CLINICAL DATA:  Left facial and weakness EXAM: MRI HEAD WITHOUT CONTRAST MRA HEAD WITHOUT CONTRAST MRA NECK WITHOUT CONTRAST TECHNIQUE: Multiplanar, multiecho pulse sequences of the brain and surrounding structures were obtained without intravenous contrast. Angiographic images of the Circle of Willis were obtained using MRA technique without intravenous contrast. Angiographic images of the neck were obtained using MRA technique without intravenous contrast. Carotid stenosis measurements (when applicable) are obtained utilizing NASCET criteria, using the distal internal carotid diameter as the denominator. COMPARISON:  None. Correlation made with same day CT FINDINGS: MRI HEAD FINDINGS Motion artifact is present. Brain: There are multiple areas of restricted diffusion involving the right basal ganglia and adjacent white matter. This corresponds to CT finding. No associated hemorrhage. Additional mild patchy T2 hyperintensity in the supratentorial white matter is nonspecific but likely reflects mild chronic microvascular ischemic changes. There is a chronic small vessel infarct of the anterior left thalamus. Punctate focus of susceptibility hypointensity is present in the left parietal white matter compatible with chronic microhemorrhage or mineralization.  Ventricles and sulci are within normal limits in size and configuration. There is no intracranial mass, significant mass effect, hydrocephalus, or extra-axial fluid collection. Vascular: Major vessel flow voids at the skull base are preserved. Skull and upper cervical spine: Marrow signal is within normal limits. Paranasal sinuses and Orbits: Minor paranasal sinus mucosal thickening. Orbits are unremarkable. Other: Mastoid air cells are clear. MRA HEAD FINDINGS Intracranial internal carotid arteries, anterior cerebral, and middle cerebral  arteries are patent. Bilateral posterior communicating arteries are present. Intracranial vertebral, basilar, and posterior cerebral arteries are patent. There is no significant stenosis or aneurysm. MRA NECK FINDINGS Motion artifact is present. Common, internal, and external carotid arteries are patent. There is no hemodynamically significant stenosis at the ICA origins. Codominant extracranial vertebral arteries are patent, noting that the origins are poorly evaluated due to artifact. IMPRESSION: Acute infarctions involving the right basal ganglia and adjacent white matter. Mild chronic microvascular ischemic changes. Small chronic infarct of the left thalamus. No hemodynamically significant stenosis. Electronically Signed   By: Macy Mis M.D.   On: 01/11/2019 17:20   Mr Angio Neck Wo Contrast  Result Date: 01/11/2019 CLINICAL DATA:  Left facial and weakness EXAM: MRI HEAD WITHOUT CONTRAST MRA HEAD WITHOUT CONTRAST MRA NECK WITHOUT CONTRAST TECHNIQUE: Multiplanar, multiecho pulse sequences of the brain and surrounding structures were obtained without intravenous contrast. Angiographic images of the Circle of Willis were obtained using MRA technique without intravenous contrast. Angiographic images of the neck were obtained using MRA technique without intravenous contrast. Carotid stenosis measurements (when applicable) are obtained utilizing NASCET criteria, using the distal internal carotid diameter as the denominator. COMPARISON:  None. Correlation made with same day CT FINDINGS: MRI HEAD FINDINGS Motion artifact is present. Brain: There are multiple areas of restricted diffusion involving the right basal ganglia and adjacent white matter. This corresponds to CT finding. No associated hemorrhage. Additional mild patchy T2 hyperintensity in the supratentorial white matter is nonspecific but likely reflects mild chronic microvascular ischemic changes. There is a chronic small vessel infarct of the  anterior left thalamus. Punctate focus of susceptibility hypointensity is present in the left parietal white matter compatible with chronic microhemorrhage or mineralization. Ventricles and sulci are within normal limits in size and configuration. There is no intracranial mass, significant mass effect, hydrocephalus, or extra-axial fluid collection. Vascular: Major vessel flow voids at the skull base are preserved. Skull and upper cervical spine: Marrow signal is within normal limits. Paranasal sinuses and Orbits: Minor paranasal sinus mucosal thickening. Orbits are unremarkable. Other: Mastoid air cells are clear. MRA HEAD FINDINGS Intracranial internal carotid arteries, anterior cerebral, and middle cerebral arteries are patent. Bilateral posterior communicating arteries are present. Intracranial vertebral, basilar, and posterior cerebral arteries are patent. There is no significant stenosis or aneurysm. MRA NECK FINDINGS Motion artifact is present. Common, internal, and external carotid arteries are patent. There is no hemodynamically significant stenosis at the ICA origins. Codominant extracranial vertebral arteries are patent, noting that the origins are poorly evaluated due to artifact. IMPRESSION: Acute infarctions involving the right basal ganglia and adjacent white matter. Mild chronic microvascular ischemic changes. Small chronic infarct of the left thalamus. No hemodynamically significant stenosis. Electronically Signed   By: Macy Mis M.D.   On: 01/11/2019 17:20   Mr Brain Wo Contrast  Result Date: 01/11/2019 CLINICAL DATA:  Left facial and weakness EXAM: MRI HEAD WITHOUT CONTRAST MRA HEAD WITHOUT CONTRAST MRA NECK WITHOUT CONTRAST TECHNIQUE: Multiplanar, multiecho pulse sequences of the brain and surrounding structures were obtained  without intravenous contrast. Angiographic images of the Circle of Willis were obtained using MRA technique without intravenous contrast. Angiographic images of  the neck were obtained using MRA technique without intravenous contrast. Carotid stenosis measurements (when applicable) are obtained utilizing NASCET criteria, using the distal internal carotid diameter as the denominator. COMPARISON:  None. Correlation made with same day CT FINDINGS: MRI HEAD FINDINGS Motion artifact is present. Brain: There are multiple areas of restricted diffusion involving the right basal ganglia and adjacent white matter. This corresponds to CT finding. No associated hemorrhage. Additional mild patchy T2 hyperintensity in the supratentorial white matter is nonspecific but likely reflects mild chronic microvascular ischemic changes. There is a chronic small vessel infarct of the anterior left thalamus. Punctate focus of susceptibility hypointensity is present in the left parietal white matter compatible with chronic microhemorrhage or mineralization. Ventricles and sulci are within normal limits in size and configuration. There is no intracranial mass, significant mass effect, hydrocephalus, or extra-axial fluid collection. Vascular: Major vessel flow voids at the skull base are preserved. Skull and upper cervical spine: Marrow signal is within normal limits. Paranasal sinuses and Orbits: Minor paranasal sinus mucosal thickening. Orbits are unremarkable. Other: Mastoid air cells are clear. MRA HEAD FINDINGS Intracranial internal carotid arteries, anterior cerebral, and middle cerebral arteries are patent. Bilateral posterior communicating arteries are present. Intracranial vertebral, basilar, and posterior cerebral arteries are patent. There is no significant stenosis or aneurysm. MRA NECK FINDINGS Motion artifact is present. Common, internal, and external carotid arteries are patent. There is no hemodynamically significant stenosis at the ICA origins. Codominant extracranial vertebral arteries are patent, noting that the origins are poorly evaluated due to artifact. IMPRESSION: Acute  infarctions involving the right basal ganglia and adjacent white matter. Mild chronic microvascular ischemic changes. Small chronic infarct of the left thalamus. No hemodynamically significant stenosis. Electronically Signed   By: Macy Mis M.D.   On: 01/11/2019 17:20   Ct Head Code Stroke Wo Contrast  Result Date: 01/11/2019 CLINICAL DATA:  Code stroke.  Stroke.  Left facial droop. EXAM: CT HEAD WITHOUT CONTRAST TECHNIQUE: Contiguous axial images were obtained from the base of the skull through the vertex without intravenous contrast. COMPARISON:  CT head 08/11/2017 FINDINGS: Brain: Ill-defined areas of hypodensity in the right basal ganglia including the lenticular nucleus and internal capsule. These were not present previously and have the appearance of acute infarction. Small area of hypodensity in the left medial basal ganglia with not present previously and could represent infarction of subacute or chronic duration. This is more well-defined and lower density than the right basal ganglia lesions. Ventricle size normal.  No hemorrhage or mass. Vascular: Arteries are relatively dense diffusely compatible with high hematocrit. Question of hyperdensity in the right M2 branches best seen on sagittal and coronal reconstructions. Skull: Negative Sinuses/Orbits: Mild mucosal edema right maxillary sinus otherwise clear. Negative orbit Other: None ASPECTS ([place] Stroke Program Early CT Score) - Ganglionic level infarction (caudate, lentiform nuclei, internal capsule, insula, M1-M3 cortex): 5 - Supraganglionic infarction (M4-M6 cortex): 3 Total score (0-10 with 10 being normal): 8 IMPRESSION: 1. Hypodensities in the right basal ganglia suspicious for acute infarction. No associated hemorrhage 2. Questionable finding of hyperdense right MCA in the right M2 segment. This could be due to acute thrombus however the vessels are relatively dense elsewhere due to high hematocrit. 3. Hypodensity left medial basal  ganglia consistent with infarct, possibly chronic. 4. ASPECTS is 8 5. These results were called by telephone at the time of  interpretation on 01/11/2019 at 10:09 am to provider Aroor, who verbally acknowledged these results. Electronically Signed   By: Franchot Gallo M.D.   On: 01/11/2019 10:12    PHYSICAL EXAM Obese young African-American male not in distress. . Afebrile. Head is nontraumatic. Neck is supple without bruit.    Cardiac exam no murmur or gallop. Lungs are clear to auscultation. Distal pulses are well felt. Neurological Exam ;  Sleepy but can be aroused.  Oriented x 3. Normal speech and language.eye movements full without nystagmus.fundi were not visualized. Vision acuity and fields appear normal. Hearing is normal. Palatal movements are normal. Face symmetric only subtle left nasolabial fold weakness when he smiles.. Tongue midline. Normal strength, tone, reflexes and coordination. Normal sensation. Gait deferred. ASSESSMENT/PLAN Harold Clements is a 38 y.o. male with history of HTN, DB, CKD stage 2 presenting with L sided weakness, having been excessively sleep for the past few days with an episode of incontinence.   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 Hyperdense R MCA M2. Hypodensity L medial basal ganglia ? Chronic infarct. ASPECTS 8    MRI  R basal ganglia and white matter infarcts. Mild small vessel disease. Old L thalamic infarct.   MRA head  No significant stenosis   MRA neck No significant stenosis    Carotid Doppler  B ICA w/o significant stenosis  2D Echo EF 40-45%. No source of embolus   TCD bubble pending   EEG no sz  Given low EF and embolic infarct , recommend TEE to further look for source of stroke. If neg, consider loop recorder to look for atrial fibrillation - TEE schedule full for tomorrow. Defer to attending and OP cardiology for  followup.    LDL 78  HgbA1c 8.8  ANA pending   ESR pending    Homocysteine pending   Sickle cell screen pending  Antiphospholipid abx pending   NH4 pending   ESR pending   Lovenox 60 mg sq daily for VTE prophylaxis  No antithrombotic prior to admission, now on aspirin 81 mg daily and clopidogrel 75 mg daily x 3 weeks then aspirin alone.   Therapy recommendations:  ? OP PT, OT eval pending    Disposition:  pending   Hypertension  Stable . Permissive hypertension (OK if < 220/120) but gradually normalize in 5-7 days . Leite-term BP goal normotensive  Hyperlipidemia  Home meds:  lipitor 40  Now on lipitor 80  LDL 78, goal < 70  Continue statin at discharge  Diabetes type II Uncontrolled  HgbA1c 8.8, goal < 7.0  Other Stroke Risk Factors  Former Cigarette smoker, quit 10 yrs ago  Former Cigar smoker, quit 4 yrs ago  Obesity, Body mass index is 38.58 kg/m., recommend weight loss, diet and exercise as appropriate   HFrEF  Probably obstructive sleep apnea, OP evaluation recommended  Other Active Problems  CKD stage 3  Hospital day # 1  I have personally obtained history,examined this patient, reviewed notes, independently viewed imaging studies, participated in medical decision making and plan of care.ROS completed by me personally and pertinent positives fully documented  I have made any additions or clarifications directly to the above note.  He presented with slurred speech and left facial weakness secondary to large right basal ganglia infarct likely of cryptogenic etiology.  He does have low ejection fraction but no known prior cardiac  history and will need cardiology consult and further work-up including TEE and prolonged cardiac monitoring for cryptogenic stroke.  He also is at high risk for obstructive sleep apnea and will need outpatient sleep study.  Recommend aspirin and Plavix for 3 weeks followed by aspirin alone and aggressive risk factor  modification.  Check ANA, ESR, sickle cell screen, antiphospholipid antibodies.  [place] discussion with patient and answered questions.  Greater than 50% time during this 35-minute visit was spent on counseling and coordination of care about his cryptogenic stroke and answering questions  Harold Contras, MD Medical Director [place] Pager: [phone] 01/12/2019 1:23 PM   To contact Stroke Continuity provider, please refer to [email or web address] After hours, contact General Neurology

## 2019-01-12 NOTE — Progress Notes (Signed)
Subjective:  Passed swallow study last night.  No acute events overnight.  BP 140/82-177/118 off HTN meds, otherwise VSS.  Saw PT this morning and was able to ambulate the halls, no weakness or imbalance.  Did have another episode of urinary incontinence during PT evaluation.  Has been lethargic but arousable with stimulation/questioning.  Resting in chair comfortably during exam this morning.  Denies any pain, weakness or numbness currently.    Objective:  Vital signs in last 24 hours: Vitals:   01/11/19 2039 01/11/19 2346 01/12/19 0300 01/12/19 0822  BP: (!) 172/109 (!) 160/80 (!) 155/89 (!) 166/129  Pulse: 83 80 88 87  Resp: 18  18 17   Temp: (!) 97.5 F (36.4 C) 98 F (36.7 C) 98.3 F (36.8 C) 98.4 F (36.9 C)  TempSrc: Oral Oral Oral Oral  SpO2: 100% 99% 99% 99%  Weight: 118.5 kg     Height: 5\' 9"  (1.753 m)      Weight change:   Intake/Output Summary (Last 24 hours) at 01/12/2019 1019 Last data filed at 01/11/2019 1919 Gross per 24 hour  Intake 3 ml  Output -  Net 3 ml   General: lethargic but responds to questions, sitting comfortably in chair in NAD Pulm: lungs clear to auscultation bilaterally, normal work of breathing on room air CV: regular rate and rhythm, no murmurs, rubs or gallups GI: abdomen soft, mildly tender in epigastric region, nondistended MSK: trace peripheral edema Neuro: face symmetric, upper and lower extremity strength 5/5 Psych: flat affect, slow speech    Assessment/Plan:  Active Problems:   Hypertension associated with diabetes (HCC)   Diabetes (HCC)   Ischemic stroke ([place])   Stroke ([place])   38 y.o. male with a PMHx of T2DM, poorly-controlled hypertension with multiple hospitalizations for hypertensive urgency, CKD stage 3 (baseline Cr ~1.7-1.9), chronic diastolic heart failure, morbid obesity, and possible untreated OSA who presented on 01/10/2019 with transient left-sided weakness and left-sided facial droop, with head CT and MRI  showing acute infarctions in the right basal ganglia and right parietal white matter.  CVA: Likely due to uncontrolled hypertension.  Weakness has now resolved.  MRA with no evidence of hemodynamically significant stenosis.  Saw PT this morning and was able to ambulate without apparent weakness or balance issues.  Echo without evidence for cardiogenic source. - Continue aspirin 81mg /day and Plavix 75mg /day - Follow-up carotid duplex - OT and ST consults - Continue telemetry and neuro checks - Continue holding antihypertensive meds to allow for permissive hypertension today, with max of 220/120 - He is quite lethargic which may be related to his cerebrovascular disease as well as OSA; will continue to monitor mental status - May be able to discharge today pending recommendations of stroke team and PT  Poorly-controlled hypertension: Contributing factors include possible untreated OSA as well as medication nonadherence.  Also has a strong family history of hypertension including his mother who had a stroke in her later 51s.  Has had some workup for secondary hypertension given young age, including renal artery [place] in 2018 that was negative for RAS.  Has also been noted to be intermittently hypokalemic suggesting possible hyperaldosteronism, but had normal aldosterone/renin ratio in 04/2017.  TSH was normal at 3.112 in 11/2018 and normal again yesterday at 2.986.  Currently BP is relatively well-controlled, ranging from 140/82-177/118.  - Continue to hold antihypertensives as above - Resume BP meds prior to discharge - Recommend sleep study as an outpatient and possibly starting CPAP  Diastolic heart  failure: Echo yesterday showed LVEF 40-45% (essentially unchanged from 11/2018 echo with EF 45-50%), mildly increased LVH, G1DD, and left ventricular global hypokinesis.  No signs of volume overload currently. - Continue to hold furosemide as above, resume prior to discharge  Type II diabetes mellitus: A1c  yesterday 8.8% (up from 8.1% in 11/2018).  Has been relatively well-controlled in the hospital, with BG 121 this morning. - Continue Lantus plus SSI  Chronic kidney disease, stage 3: Renal function stable, with Cr down from 2.1 -> 2.02 this morning - Continue to monitor  CODE STATUS: FULL Diet: Switch from NPO to regular now that he has passed swallow study DVT prophylaxis: subq Lovenox 60mg /day Dispo: Anticipated discharge today   LOS: 1 day   [name], Medical Student 01/12/2019, 10:19 AM

## 2019-01-12 NOTE — Progress Notes (Signed)
SLP Cancellation Note  Patient Details Name: Harold Clements MRN: [id number] DOB: 05-12-1980   Cancelled treatment:       Reason Eval/Treat Not Completed: Fatigue/lethargy limiting ability to participate. Pt unable to maintain alertness for evaluation, despite cold washcloth or verbal/tactile stimulation. RN informed. Will continue efforts to complete SLE.  Carmela Rima, [place] Speech Language Pathologist Office: [phone] Pager: [phone]  Harold Clements 01/12/2019, 10:01 AM

## 2019-01-12 NOTE — Evaluation (Signed)
Occupational Therapy Evaluation Patient Details Name: Harold Clements MRN: [id number] DOB: December 31, 1980 Today's Date: 01/12/2019    History of Present Illness 38 yo admitted with left weakness, facial droop, drowsiness with Rt basal ganglia infarct out of window for tPA. PMhx: CKD, DM, HTN   Clinical Impression   Pt admitted with above diagnoses, presenting with cognitive deficits, visual deficits, and decreased activity tolerance. PTA pt independent and working FT. He is a father of two young children (16 and 78) with a newborn on the way in February. At time of eval he is a min guard for functional transfers and mobility. Visual assessment completed per information below, pt presenting with inferior visual field deficits (R >L). Pt needing occasional min A to correct LOB. Pt showing decreased awareness of safety and deficits throughout session. Recommending supervision with OP OT at this time, will continue to follow per POC listed below.    Follow Up Recommendations  Outpatient OT;Supervision - Intermittent;Other (comment)(pending progress)    Equipment Recommendations  3 in 1 bedside commode    Recommendations for Other Services       Precautions / Restrictions Precautions Precautions: Fall Restrictions Weight Bearing Restrictions: No      Mobility Bed Mobility               General bed mobility comments: up in chair  Transfers Overall transfer level: Needs assistance   Transfers: Sit to/from Stand Sit to Stand: Min guard         General transfer comment: min guard for safety    Balance Overall balance assessment: Needs assistance Sitting-balance support: Feet supported Sitting balance-Leahy Scale: Good     Standing balance support: No upper extremity supported;During functional activity Standing balance-Leahy Scale: Fair                             ADL either performed or assessed with clinical judgement   ADL Overall ADL's : Needs  assistance/impaired Eating/Feeding: Set up;Sitting   Grooming: Min guard;Standing   Upper Body Bathing: Set up;Sitting   Lower Body Bathing: Min guard;Sitting/lateral leans;Sit to/from stand   Upper Body Dressing : Set up;Sitting   Lower Body Dressing: Min guard;Sitting/lateral leans;Sit to/from stand Lower Body Dressing Details (indicate cue type and reason): able to fix sock while seated in recliner Toilet Transfer: Min guard;Ambulation;Regular Toilet;Grab bars   Toileting- Clothing Manipulation and Hygiene: Min guard;Sitting/lateral lean;Sit to/from stand   Tub/ Banker: Min guard;Ambulation;Shower seat   Functional mobility during ADLs: Min guard General ADL Comments: pt overall min guard, but needing cue to consistently adhere to task     Vision Baseline Vision/History: No visual deficits Patient Visual Report: No change from baseline;Other (comment)(reports no change, but ax says otherwise) Vision Assessment?: Yes Eye Alignment: Within Functional Limits Tracking/Visual Pursuits: Impaired - to be further tested in functional context;Requires cues, head turns, or add eye shifts to track;Decreased smoothness of eye movement to RIGHT inferior field;Decreased smoothness of eye movement to LEFT inferior field Visual Fields: Other (comment);Impaired-to be further tested in functional context(inferior field deficits R > L) Additional Comments: pt with difficulty keeping eyes wide open consistently for observation from therapist. Appears to have deficits in inferior field R >L     Perception     Praxis      Pertinent Vitals/Pain Pain Assessment: No/denies pain     Hand Dominance     Extremity/Trunk Assessment Upper Extremity Assessment Upper Extremity Assessment: Overall [facility]  for tasks assessed   Lower Extremity Assessment Lower Extremity Assessment: Defer to PT evaluation       Communication Communication Communication: No difficulties   Cognition  Arousal/Alertness: Lethargic Behavior During Therapy: Flat affect Overall Cognitive Status: Impaired/Different from baseline Area of Impairment: Orientation;Safety/judgement;Problem solving                 Orientation Level: Situation       Safety/Judgement: Decreased awareness of safety;Decreased awareness of deficits   Problem Solving: Slow processing;Decreased initiation;Requires verbal cues;Requires tactile cues General Comments: overall very lethargic and flat. unaware of situation. Not able to recall 3/3 words. Needing step by step cues to engage in session and step by step tasks. Pt with few minor LOB, but stating he did not notice and is fine   General Comments       Exercises     Shoulder Instructions      Home Living Family/patient expects to be discharged to:: Private residence Living Arrangements: Spouse/significant other;Children Available Help at Discharge: Family;Available 24 hours/day Type of Home: Mobile home Home Access: Stairs to enter Entrance Stairs-Number of Steps: 3   Home Layout: One level     Bathroom Shower/Tub: Occupational psychologist: Standard     Home Equipment: None          Prior Functioning/Environment Level of Independence: Independent        Comments: works at [employer]. 2 kids 71yo and 65yo with baby due in February        OT Problem List: Decreased strength;Decreased knowledge of use of DME or AE;Decreased coordination;Decreased activity tolerance;Decreased cognition;Impaired balance (sitting and/or standing);Decreased safety awareness      OT Treatment/Interventions: Self-care/ADL training;Visual/perceptual remediation/compensation;Therapeutic exercise;Patient/family education;Neuromuscular education;Balance training;Therapeutic activities;DME and/or AE instruction;Cognitive remediation/compensation    OT Goals(Current goals can be found in the care plan section) Acute Rehab OT Goals Patient  Stated Goal: return to work OT Goal Formulation: With patient Time For Goal Achievement: 01/26/19 Potential to Achieve Goals: Good  OT Frequency: Min 3X/week   Barriers to D/C:            Co-evaluation              AM-PAC OT "6 Clicks" Daily Activity     Outcome Measure Help from another person eating meals?: A Little Help from another person taking care of personal grooming?: A Little Help from another person toileting, which includes using toliet, bedpan, or urinal?: A Little Help from another person bathing (including washing, rinsing, drying)?: A Little Help from another person to put on and taking off regular upper body clothing?: A Little Help from another person to put on and taking off regular lower body clothing?: A Little 6 Click Score: 18   End of Session Equipment Utilized During Treatment: Gait belt Nurse Communication: Mobility status  Activity Tolerance: Patient tolerated treatment well Patient left: in chair;with call bell/phone within reach  OT Visit Diagnosis: Unsteadiness on feet (R26.81);Other abnormalities of gait and mobility (R26.89);Other symptoms and signs involving cognitive function;Other symptoms and signs involving the nervous system (R29.898)                Time: [id number] OT Time Calculation (min): 19 min Charges:  OT General Charges $OT Visit: 1 Visit OT Evaluation $OT Eval Low Complexity: 1 Low  Zenovia Jarred, MSOT, OTR/L [place] OT/ Acute Relief OT [facility] Office: [place] 01/12/2019, 4:46 PM

## 2019-01-13 DIAGNOSIS — G4733 Obstructive sleep apnea (adult) (pediatric): Secondary | ICD-10-CM

## 2019-01-13 DIAGNOSIS — Z6838 Body mass index (BMI) 38.0-38.9, adult: Secondary | ICD-10-CM

## 2019-01-13 DIAGNOSIS — Z7982 Long term (current) use of aspirin: Secondary | ICD-10-CM

## 2019-01-13 DIAGNOSIS — I5022 Chronic systolic (congestive) heart failure: Secondary | ICD-10-CM

## 2019-01-13 DIAGNOSIS — Z7902 Long term (current) use of antithrombotics/antiplatelets: Secondary | ICD-10-CM

## 2019-01-13 LAB — GLUCOSE, CAPILLARY
Glucose-Capillary: 99 mg/dL (ref 70–99)
Glucose-Capillary: 119 mg/dL — ABNORMAL HIGH (ref 70–99)

## 2019-01-13 LAB — SICKLE CELL SCREEN: Sickle Cell Screen: NEGATIVE

## 2019-01-13 LAB — ANA W/REFLEX IF POSITIVE: Anti Nuclear Antibody (ANA): NEGATIVE

## 2019-01-13 LAB — HOMOCYSTEINE: Homocysteine: 16.1 umol/L — ABNORMAL HIGH (ref 0.0–14.5)

## 2019-01-13 MED ORDER — CLOPIDOGREL BISULFATE 75 MG PO TABS: 75.0000 mg | ORAL_TABLET | Freq: Every day | ORAL | 0 refills | Status: DC

## 2019-01-13 MED ORDER — ATORVASTATIN CALCIUM 80 MG PO TABS: 80.0000 mg | ORAL_TABLET | Freq: Every day | ORAL | 0 refills | Status: DC

## 2019-01-13 MED ORDER — CANAGLIFLOZIN 100 MG PO TABS: 100.0000 mg | ORAL_TABLET | Freq: Every day | ORAL | 0 refills | Status: DC

## 2019-01-13 MED ORDER — METFORMIN HCL 1000 MG PO TABS: 1000.0000 mg | ORAL_TABLET | Freq: Two times a day (BID) | ORAL | 0 refills | Status: DC

## 2019-01-13 MED ORDER — ASPIRIN 81 MG PO CHEW: 81.0000 mg | CHEWABLE_TABLET | Freq: Every day | ORAL | 0 refills | Status: DC

## 2019-01-13 MED ORDER — NOVOLIN 70/30 FLEXPEN (70-30) 100 UNIT/ML ~~LOC~~ SUPN: 26.0000 [IU] | PEN_INJECTOR | Freq: Two times a day (BID) | SUBCUTANEOUS | 11 refills | Status: DC

## 2019-01-13 NOTE — Progress Notes (Signed)
Discharge instructions went over with patient's wife at bedside. IV removed. Telemetry discontinued. Wife packed up patient's belongings. Patient transported off unit via wheelchair and belongings sent home with patient.  Gwendolyn Grant, RN

## 2019-01-13 NOTE — Progress Notes (Signed)
   Subjective:  No acute events overnight.  Continues to be very lethargic and slow in speech but has been able to be evaluated by OT who recommended outpatient OT at least 3x/week.  Denies pain anywhere.  Vital signs stable.  Objective:  Vital signs in last 24 hours: Vitals:   01/13/19 0100 01/13/19 0340 01/13/19 0423 01/13/19 0848  BP:  (!) 155/109  (!) 162/112  Pulse:  (!) 58  75  Resp:  19  18  Temp: 98 F (36.7 C)  98.4 F (36.9 C) 97.7 F (36.5 C)  TempSrc: Oral  Oral Oral  SpO2:  97%  100%  Weight:      Height:       Weight change:  No intake or output data in the 24 hours ending 01/13/19 1050  General: lethargic but responds to questions, sitting comfortably in chair in NAD Pulm: normal work of breathing on room air CV: regular rate and rhythm, no murmurs, rubs or gallups GI: abdomen soft, nontender, nondistended MSK: no peripheral edema Psych: flat affect, slow speech    Assessment/Plan:  Active Problems:   Hypertension associated with diabetes (HCC)   Diabetes (HCC)   Chronic kidney disease, stage 3a   Ischemic stroke ([place])   Stroke ([place])   Hypertensive heart disease   38 y.o. male with a PMHx of T2DM, poorly-controlled hypertension with multiple hospitalizations for hypertensive urgency, CKD stage 3 (baseline Cr ~1.7-1.9), chronic diastolic heart failure, morbid obesity, and possible untreated OSA who presented on 01/10/2019 with transient left-sided weakness and left-sided facial droop, with head CT and MRI showing acute infarctions in the right basal ganglia and right parietal white matter.  CVA of right basal ganglia: Weakness has resolved but patient continues to be very lethargic.  Carotid duplex US yesterday showed right and left carotids were near-normal with only minimal wall thickening or plaque.  Also had EEG yesterday which was normal.  Per stroke team, patient would benefit from further workup of cause of CVA.  ESR was slightly elevated at 22  yesterday; other workup is pending. - Following antiphospholipid antibodies, ANA, sickle cell screen and homocysteine - TEE pending to evaluate for embolic cause of CVA - If TEE negative, Neurology recommends prolonged cardiac monitoring, eg loop recorder, to look for atrial fibrillation - Continue atorvastatin 10m daily.  Was taking 453mdaily at home, but will discharge on higher dose per stroke team to target LDL of <70 in post-stroke patient (last LDL 78 on 11/12) - Checking urine drug screen and ABG for potential causes of AMS, including hypercapnia  Poorly-controlled hypertension: - Stay off home meds for today - Gradually normalize BP within 5-7 days per stroke team - Needs outpatient sleep study and possible CPAP for likely OSA  HFrEF: EF 40-45%.  Given young age Neurology recommended further workup. - Follow-up with Cardiology outpatient  Type II diabetes: A1c 8.8% on 11/12.  Had been on 26u of 70/30 Novolin, Invokana 10049mmetformin 1000m61mD at home.   - Continue Lantus plus SSI, discharge on home meds - A1c goal in post-stroke patient <7%.  Needs close outpatient follow-up to address glycemic control.  Dispo: Floor   LOS: 2 days   UndeTonia Ghentdical Student 01/13/2019, 10:50 AM

## 2019-01-13 NOTE — Discharge Instructions (Signed)
You were hospitalized at [facility] for a stroke which caused your weakness and facial droop.  We think that your high blood pressure may have been part of the reason why you had a stroke, but there could be another cause as well.  In order to prevent you from having another stroke, it will be important to get your blood pressure under better control and to go to your medical appointments.  You will be seeing a cardiologist next Friday.  At that visit they will do a sleep study, to look for sleep apnea, and an imaging study of your heart, to look for possible reasons for your stroke.  You will also be seeing Dr. Leonie Man, the neurologist you saw in the hospital, in 6 weeks.  Continue taking your aspirin and Plavix every day for 3 weeks.  After 3 weeks, stop taking the Plavix but continue taking the aspirin.  It was a pleasure taking care of you.

## 2019-01-13 NOTE — Progress Notes (Signed)
Occupational Therapy Treatment Patient Details Name: Harold Clements MRN: [id number] DOB: [birth year] Today's Date: 01/13/2019    History of present illness 38 yo admitted with left weakness, facial droop, drowsiness with Rt basal ganglia infarct out of window for tPA. PMhx: CKD, DM, HTN   OT comments  Pt making steady progress towards OT goals this session. Pt able to locate 5/5 ADL items on sink. Pt required MIN guard- MIN A for standing balance with one episode of pt losing balance slightly with pt needing MIN A to self correct. Pt required MAX A for LB dressing needing cues to sequence task and maintain figure four to don socks from recliner. Pt complete functional mobility and transfers with no AD and min guard assist. DC plan remains appropriate, will continue to follow acutely per POC. Plan for next session is to practice tub shower transfer to 3n1.     Follow Up Recommendations  Outpatient OT;Supervision - Intermittent;Other (comment)(pending progress)    Equipment Recommendations  3 in 1 bedside commode    Recommendations for Other Services      Precautions / Restrictions Precautions Precautions: Fall Restrictions Weight Bearing Restrictions: No       Mobility Bed Mobility               General bed mobility comments: OOB in chair  Transfers Overall transfer level: Needs assistance Equipment used: None Transfers: Sit to/from Stand Sit to Stand: Min guard         General transfer comment: min guard for safety    Balance Overall balance assessment: Needs assistance Sitting-balance support: Feet supported Sitting balance-Leahy Scale: Good Sitting balance - Comments: able to reach out of BOS to don socks   Standing balance support: No upper extremity supported;During functional activity Standing balance-Leahy Scale: Fair Standing balance comment: close min guard with slight LOB during standing grooming; MIN A needed from therapist to self correct                            ADL either performed or assessed with clinical judgement   ADL Overall ADL's : Needs assistance/impaired     Grooming: Wash/dry hands;Wash/dry face;Oral care;Min guard;Standing Grooming Details (indicate cue type and reason): close min guard for safety, pt with slight LOB standing needing MIN A from therapist, pt able to locate 5/5 ADL items on sink in all visual quadrants Upper Body Bathing: Min guard;Standing           Lower Body Dressing: Maximal assistance;Sitting/lateral leans Lower Body Dressing Details (indicate cue type and reason): MAX A to don socks from recliner; needed assist to maintain figure four to don socks Toilet Transfer: Min guard;Ambulation;Minimal assistance Toilet Transfer Details (indicate cue type and reason): MIN guard- MIN A for simulated toilet transfer via functional mobility with no AD, pt needing intermittent MIN A for balance         Functional mobility during ADLs: Min guard;Minimal assistance General ADL Comments: pt overall min guard- min a for functional mobility and transfers, min guard UB ADL, MAX A LB ADL     Vision Baseline Vision/History: No visual deficits Patient Visual Report: No change from baseline;Other (comment)     Perception     Praxis      Cognition Arousal/Alertness: Lethargic Behavior During Therapy: Flat affect Overall Cognitive Status: Impaired/Different from baseline Area of Impairment: Orientation;Safety/judgement;Problem solving;Following commands  Orientation Level: Disoriented to;Time     Following Commands: Follows one step commands with increased time;Follows multi-step commands with increased time Safety/Judgement: Decreased awareness of safety;Decreased awareness of deficits   Problem Solving: Slow processing;Requires verbal cues General Comments: overall very lethargic and flat. oriented to situation an able to recall why he is in the hospital, originally  telling therpist he worked at [employer] but then corrects self when cued, able to recall that he has 2 children but did not mention one on the way, able to located all needed ADL items on sink with verbal cues, slow to process overall needing increased time        Exercises     Shoulder Instructions       General Comments VSS    Pertinent Vitals/ Pain       Pain Assessment: No/denies pain  Home Living                                          Prior Functioning/Environment              Frequency  Min 3X/week        Progress Toward Goals  OT Goals(current goals can now be found in the care plan section)  Progress towards OT goals: Progressing toward goals  Acute Rehab OT Goals Patient Stated Goal: return to work OT Goal Formulation: With patient Time For Goal Achievement: 01/26/19 Potential to Achieve Goals: Good  Plan Discharge plan remains appropriate    Co-evaluation                 AM-PAC OT "6 Clicks" Daily Activity     Outcome Measure   Help from another person eating meals?: A Little Help from another person taking care of personal grooming?: A Little Help from another person toileting, which includes using toliet, bedpan, or urinal?: A Little Help from another person bathing (including washing, rinsing, drying)?: A Little Help from another person to put on and taking off regular upper body clothing?: A Little Help from another person to put on and taking off regular lower body clothing?: A Little 6 Click Score: 18    End of Session Equipment Utilized During Treatment: Gait belt  OT Visit Diagnosis: Unsteadiness on feet (R26.81);Other abnormalities of gait and mobility (R26.89);Other symptoms and signs involving cognitive function;Other symptoms and signs involving the nervous system (R29.898)   Activity Tolerance Patient tolerated treatment well   Patient Left in chair;with call bell/phone within reach;with chair alarm set    Nurse Communication Mobility status;Other (comment)(extra pills left in cup- RN aware and reports okay to give to pt)        Time: [id number] OT Time Calculation (min): 23 min  Charges: OT General Charges $OT Visit: 1 Visit OT Treatments $Self Care/Home Management : 23-37 mins  Lanier Clam., COTA/L Acute Rehabilitation Services [phone] [place] 01/13/2019, 10:17 AM

## 2019-01-13 NOTE — TOC Transition Note (Signed)
Transition of Care [facility]) - CM/SW Discharge Note   Patient Details  Name: Harold Clements MRN: [id number] Date of Birth: May 29, 1980  Transition of Care [facility]) CM/SW Contact:  Pollie Friar, RN Phone Number: 01/13/2019, 2:02 PM   Clinical Narrative:    Patient is discharging home with outpatient therapy. CM met with the patient and called the wife. The spouse wants to have his therapies in [place]. Orders in Epic and information on the AVS.  Pt without a PCP. Wife interested in having one in [place]. CM was able to get an appointment at the [facility]. Information on the AVS. Wife to provide transport home.   Final next level of care: OP Rehab Barriers to Discharge: No Barriers Identified   Patient Goals and CMS Choice   CMS Medicare.gov Compare Post Acute Care list provided to:: Patient Represenative (must comment) Choice offered to / list presented to : Spouse  Discharge Placement                       Discharge Plan and Services                                     Social Determinants of Health (SDOH) Interventions     Readmission Risk Interventions No flowsheet data found.

## 2019-01-13 NOTE — Progress Notes (Signed)
Physical Therapy Treatment Patient Details Name: Harold Clements MRN: [id number] DOB: 1980-12-14 Today's Date: 01/13/2019    History of Present Illness 38 yo admitted with left weakness, facial droop, drowsiness with Rt basal ganglia infarct out of window for tPA. PMhx: CKD, DM, HTN    PT Comments    Pt lethargic, supine on arrival with breakfast tray present. Pt able to arouse with increased time and stimulation with noted condom cath off. Asked pt if he felt need to void and he was unsure but was able to walk to toilet to void. Pt with maintained decreased attention, problem solving, command following and decreased balance who will benefit from OPPT. Pt educated for current deficits and need for supervision as well as inability to drive or return to work currently. Will continue to follow.     Follow Up Recommendations  Outpatient PT     Equipment Recommendations  None recommended by PT    Recommendations for Other Services       Precautions / Restrictions Precautions Precautions: Fall Precaution Comments: urinary incontinence Restrictions Weight Bearing Restrictions: No    Mobility  Bed Mobility   Bed Mobility: Supine to Sit     Supine to sit: Supervision     General bed mobility comments: pt able to transition to sitting with cues to initiate bed flat and use of rail  Transfers Overall transfer level: Needs assistance Equipment used: None Transfers: Sit to/from Stand Sit to Stand: Min guard         General transfer comment: min guard for safety  Ambulation/Gait Ambulation/Gait assistance: Min guard Gait Distance (Feet): 400 Feet Assistive device: None Gait Pattern/deviations: Step-through pattern;Decreased stride length   Gait velocity interpretation: 1.31 - 2.62 ft/sec, indicative of limited community ambulator General Gait Details: pt with slow gait with inability to change speed during ambulation. Pt able to perform head turns during gait without LOB.  Pt needing assist and repetition with all directional cues   Stairs Stairs: Yes Stairs assistance: Min guard Stair Management: Alternating pattern;Forwards Number of Stairs: 11 General stair comments: pt cued to ambulate 3 steps then turn. He proceeded up entire flight despite additional cues and was unable to recall initial instruction   Wheelchair Mobility    Modified Rankin (Stroke Patients Only) Modified Rankin (Stroke Patients Only) Pre-Morbid Rankin Score: No symptoms Modified Rankin: Moderate disability     Balance Overall balance assessment: Needs assistance Sitting-balance support: Feet supported Sitting balance-Leahy Scale: Good Sitting balance - Comments: able to reach out of BOS to don socks   Standing balance support: No upper extremity supported;During functional activity Standing balance-Leahy Scale: Good Standing balance comment: close min guard with slight LOB during standing grooming; MIN A needed from therapist to self correct                 Standardized Balance Assessment Standardized Balance Assessment : Berg Balance Test Berg Balance Test Sit to Stand: Able to stand without using hands and stabilize independently Standing Unsupported: Able to stand safely 2 minutes Sitting with Back Unsupported but Feet Supported on Floor or Stool: Able to sit safely and securely 2 minutes Stand to Sit: Sits safely with minimal use of hands Transfers: Able to transfer safely, minor use of hands Standing Unsupported with Eyes Closed: Able to stand 10 seconds safely Standing Ubsupported with Feet Together: Able to place feet together independently and stand 1 minute safely From Standing, Reach Forward with Outstretched Arm: Can reach forward >12 cm safely (5") From  Standing Position, Pick up Object from Floor: Able to pick up shoe safely and easily From Standing Position, Turn to Look Behind Over each Shoulder: Turn sideways only but maintains balance Turn 360  Degrees: Needs close supervision or verbal cueing Standing Unsupported, Alternately Place Feet on Step/Stool: Able to complete >2 steps/needs minimal assist Standing Unsupported, One Foot in Front: Needs help to step but can hold 15 seconds Standing on One Leg: Tries to lift leg/unable to hold 3 seconds but remains standing independently Total Score: 41        Cognition Arousal/Alertness: Lethargic Behavior During Therapy: Flat affect Overall Cognitive Status: Impaired/Different from baseline Area of Impairment: Orientation;Safety/judgement;Problem solving;Following commands                 Orientation Level: Disoriented to;Time     Following Commands: Follows multi-step commands consistently;Follows one step commands inconsistently Safety/Judgement: Decreased awareness of safety;Decreased awareness of deficits   Problem Solving: Slow processing;Requires verbal cues General Comments: pt lethargic on arrival with increased time to arouse fully. Pt one day off with orientation but able to recall CVA. Pt following one - 2 step commands inconsistently throughout session particular difficulty with right/left directional cues and remembering 2 step commands with delay in hallway. Pt in room with less distraction able to follow all single step commands      Exercises      General Comments General comments (skin integrity, edema, etc.): VSS      Pertinent Vitals/Pain Pain Assessment: No/denies pain    Home Living                      Prior Function            PT Goals (current goals can now be found in the care plan section) Acute Rehab PT Goals Patient Stated Goal: return to work Progress towards PT goals: Progressing toward goals    Frequency    Min 4X/week      PT Plan Current plan remains appropriate    Co-evaluation              AM-PAC PT "6 Clicks" Mobility   Outcome Measure  Help needed turning from your back to your side while in a  flat bed without using bedrails?: None Help needed moving from lying on your back to sitting on the side of a flat bed without using bedrails?: A Little Help needed moving to and from a bed to a chair (including a wheelchair)?: A Little Help needed standing up from a chair using your arms (e.g., wheelchair or bedside chair)?: A Little Help needed to walk in hospital room?: A Little Help needed climbing 3-5 steps with a railing? : A Little 6 Click Score: 19    End of Session Equipment Utilized During Treatment: Gait belt Activity Tolerance: Patient tolerated treatment well Patient left: in chair;with call bell/phone within reach;with chair alarm set Nurse Communication: Mobility status PT Visit Diagnosis: Other abnormalities of gait and mobility (R26.89)     Time: [id number] PT Time Calculation (min) (ACUTE ONLY): 25 min  Charges:  $Gait Training: 8-22 mins $Physical Performance Test: 8-22 mins                     Bayard Males, PT Acute Rehabilitation Services Pager: [phone] Office: [place] [facility] Joangel Vanosdol 01/13/2019, 11:57 AM

## 2019-01-13 NOTE — Progress Notes (Signed)
STROKE TEAM PROGRESS NOTE   INTERVAL HISTORY Patient remains sleepy this morning but can be aroused.  His wife is at the bedside.  She states he does have suspected sleep apnea and in fact had an appointment to do outpatient sleep study ordered by Dr. Claiborne Billings cardiologist but missed appointment as he did not get his Covid test performed prior to it.  Wants to go home.  Cardiology evaluated his echo and she said it was unchanged from a few years ago and suggest outpatient cardiology follow-up  Vitals:   01/13/19 0340 01/13/19 0423 01/13/19 0848 01/13/19 1153  BP: (!) 155/109  (!) 162/112 (!) 151/95  Pulse: (!) 58  75 72  Resp: '19  18 18  ' Temp:  98.4 F (36.9 C) 97.7 F (36.5 C) 98 F (36.7 C)  TempSrc:  Oral Oral Oral  SpO2: 97%  100% 100%  Weight:      Height:        CBC:  Recent Labs  Lab 01/11/19 0946 01/11/19 1021  WBC 5.6  --   NEUTROABS 3.5  --   HGB 15.4 15.6  HCT 46.1 46.0  MCV 80.5  --   PLT 229  --     Basic Metabolic Panel:  Recent Labs  Lab 01/11/19 0946 01/11/19 1021 01/12/19 0339  NA 141 144 144  K 4.0 4.1 3.4*  CL 108 105 109  CO2 23  --  25  GLUCOSE 206* 207* 121*  BUN 22* 27* 20  CREATININE 2.18* 2.10* 2.02*  CALCIUM 9.5  --  9.6   Lipid Panel:     Component Value Date/Time   CHOL 134 01/12/2019 0339   TRIG 118 01/12/2019 0339   HDL 32 (L) 01/12/2019 0339   CHOLHDL 4.2 01/12/2019 0339   VLDL 24 01/12/2019 0339   LDLCALC 78 01/12/2019 0339   HgbA1c:  Lab Results  Component Value Date   HGBA1C 8.8 (H) 01/12/2019   Urine Drug Screen:     Component Value Date/Time   LABOPIA NONE DETECTED 11/27/2018 0830   COCAINSCRNUR NONE DETECTED 11/27/2018 0830   LABBENZ NONE DETECTED 11/27/2018 0830   AMPHETMU NONE DETECTED 11/27/2018 0830   THCU NONE DETECTED 11/27/2018 0830   LABBARB NONE DETECTED 11/27/2018 0830    Alcohol Level No results found for: [facility]  IMAGING Mr Angio Head Wo Contrast  Result Date: 01/11/2019 CLINICAL DATA:  Left  facial and weakness EXAM: MRI HEAD WITHOUT CONTRAST MRA HEAD WITHOUT CONTRAST MRA NECK WITHOUT CONTRAST TECHNIQUE: Multiplanar, multiecho pulse sequences of the brain and surrounding structures were obtained without intravenous contrast. Angiographic images of the Circle of Willis were obtained using MRA technique without intravenous contrast. Angiographic images of the neck were obtained using MRA technique without intravenous contrast. Carotid stenosis measurements (when applicable) are obtained utilizing NASCET criteria, using the distal internal carotid diameter as the denominator. COMPARISON:  None. Correlation made with same day CT FINDINGS: MRI HEAD FINDINGS Motion artifact is present. Brain: There are multiple areas of restricted diffusion involving the right basal ganglia and adjacent white matter. This corresponds to CT finding. No associated hemorrhage. Additional mild patchy T2 hyperintensity in the supratentorial white matter is nonspecific but likely reflects mild chronic microvascular ischemic changes. There is a chronic small vessel infarct of the anterior left thalamus. Punctate focus of susceptibility hypointensity is present in the left parietal white matter compatible with chronic microhemorrhage or mineralization. Ventricles and sulci are within normal limits in size and configuration. There is no intracranial  mass, significant mass effect, hydrocephalus, or extra-axial fluid collection. Vascular: Major vessel flow voids at the skull base are preserved. Skull and upper cervical spine: Marrow signal is within normal limits. Paranasal sinuses and Orbits: Minor paranasal sinus mucosal thickening. Orbits are unremarkable. Other: Mastoid air cells are clear. MRA HEAD FINDINGS Intracranial internal carotid arteries, anterior cerebral, and middle cerebral arteries are patent. Bilateral posterior communicating arteries are present. Intracranial vertebral, basilar, and posterior cerebral arteries are  patent. There is no significant stenosis or aneurysm. MRA NECK FINDINGS Motion artifact is present. Common, internal, and external carotid arteries are patent. There is no hemodynamically significant stenosis at the ICA origins. Codominant extracranial vertebral arteries are patent, noting that the origins are poorly evaluated due to artifact. IMPRESSION: Acute infarctions involving the right basal ganglia and adjacent white matter. Mild chronic microvascular ischemic changes. Small chronic infarct of the left thalamus. No hemodynamically significant stenosis. Electronically Signed   By: Macy Mis M.D.   On: 01/11/2019 17:20   Mr Angio Neck Wo Contrast  Result Date: 01/11/2019 CLINICAL DATA:  Left facial and weakness EXAM: MRI HEAD WITHOUT CONTRAST MRA HEAD WITHOUT CONTRAST MRA NECK WITHOUT CONTRAST TECHNIQUE: Multiplanar, multiecho pulse sequences of the brain and surrounding structures were obtained without intravenous contrast. Angiographic images of the Circle of Willis were obtained using MRA technique without intravenous contrast. Angiographic images of the neck were obtained using MRA technique without intravenous contrast. Carotid stenosis measurements (when applicable) are obtained utilizing NASCET criteria, using the distal internal carotid diameter as the denominator. COMPARISON:  None. Correlation made with same day CT FINDINGS: MRI HEAD FINDINGS Motion artifact is present. Brain: There are multiple areas of restricted diffusion involving the right basal ganglia and adjacent white matter. This corresponds to CT finding. No associated hemorrhage. Additional mild patchy T2 hyperintensity in the supratentorial white matter is nonspecific but likely reflects mild chronic microvascular ischemic changes. There is a chronic small vessel infarct of the anterior left thalamus. Punctate focus of susceptibility hypointensity is present in the left parietal white matter compatible with chronic  microhemorrhage or mineralization. Ventricles and sulci are within normal limits in size and configuration. There is no intracranial mass, significant mass effect, hydrocephalus, or extra-axial fluid collection. Vascular: Major vessel flow voids at the skull base are preserved. Skull and upper cervical spine: Marrow signal is within normal limits. Paranasal sinuses and Orbits: Minor paranasal sinus mucosal thickening. Orbits are unremarkable. Other: Mastoid air cells are clear. MRA HEAD FINDINGS Intracranial internal carotid arteries, anterior cerebral, and middle cerebral arteries are patent. Bilateral posterior communicating arteries are present. Intracranial vertebral, basilar, and posterior cerebral arteries are patent. There is no significant stenosis or aneurysm. MRA NECK FINDINGS Motion artifact is present. Common, internal, and external carotid arteries are patent. There is no hemodynamically significant stenosis at the ICA origins. Codominant extracranial vertebral arteries are patent, noting that the origins are poorly evaluated due to artifact. IMPRESSION: Acute infarctions involving the right basal ganglia and adjacent white matter. Mild chronic microvascular ischemic changes. Small chronic infarct of the left thalamus. No hemodynamically significant stenosis. Electronically Signed   By: Macy Mis M.D.   On: 01/11/2019 17:20   Mr Brain Wo Contrast  Result Date: 01/11/2019 CLINICAL DATA:  Left facial and weakness EXAM: MRI HEAD WITHOUT CONTRAST MRA HEAD WITHOUT CONTRAST MRA NECK WITHOUT CONTRAST TECHNIQUE: Multiplanar, multiecho pulse sequences of the brain and surrounding structures were obtained without intravenous contrast. Angiographic images of the Circle of Willis were obtained using MRA technique  without intravenous contrast. Angiographic images of the neck were obtained using MRA technique without intravenous contrast. Carotid stenosis measurements (when applicable) are obtained  utilizing NASCET criteria, using the distal internal carotid diameter as the denominator. COMPARISON:  None. Correlation made with same day CT FINDINGS: MRI HEAD FINDINGS Motion artifact is present. Brain: There are multiple areas of restricted diffusion involving the right basal ganglia and adjacent white matter. This corresponds to CT finding. No associated hemorrhage. Additional mild patchy T2 hyperintensity in the supratentorial white matter is nonspecific but likely reflects mild chronic microvascular ischemic changes. There is a chronic small vessel infarct of the anterior left thalamus. Punctate focus of susceptibility hypointensity is present in the left parietal white matter compatible with chronic microhemorrhage or mineralization. Ventricles and sulci are within normal limits in size and configuration. There is no intracranial mass, significant mass effect, hydrocephalus, or extra-axial fluid collection. Vascular: Major vessel flow voids at the skull base are preserved. Skull and upper cervical spine: Marrow signal is within normal limits. Paranasal sinuses and Orbits: Minor paranasal sinus mucosal thickening. Orbits are unremarkable. Other: Mastoid air cells are clear. MRA HEAD FINDINGS Intracranial internal carotid arteries, anterior cerebral, and middle cerebral arteries are patent. Bilateral posterior communicating arteries are present. Intracranial vertebral, basilar, and posterior cerebral arteries are patent. There is no significant stenosis or aneurysm. MRA NECK FINDINGS Motion artifact is present. Common, internal, and external carotid arteries are patent. There is no hemodynamically significant stenosis at the ICA origins. Codominant extracranial vertebral arteries are patent, noting that the origins are poorly evaluated due to artifact. IMPRESSION: Acute infarctions involving the right basal ganglia and adjacent white matter. Mild chronic microvascular ischemic changes. Small chronic infarct of  the left thalamus. No hemodynamically significant stenosis. Electronically Signed   By: Macy Mis M.D.   On: 01/11/2019 17:20   Vas [place] Transcranial Doppler W Bubbles  Result Date: 01/13/2019  Transcranial Doppler with Bubble Indications: Stroke. Performing Technologist: Oda Cogan RDMS, RVT  Examination Guidelines: A complete evaluation includes B-mode imaging, spectral Doppler, color Doppler, and power Doppler as needed of all accessible portions of each vessel. Bilateral testing is considered an integral part of a complete examination. Limited examinations for reoccurring indications may be performed as noted.  Summary:  A vascular evaluation was performed. The right middle cerebral artery was studied. An IV was inserted into the patient's right hand. Verbal informed consent was obtained.  No HITS at rest or during Valsalva. Negative TCD Bubble study *See table(s) above for measurements and observations.  Diagnosing physician: Antony Contras MD Electronically signed by Antony Contras MD on 01/13/2019 at 11:05:57 AM.    Final    Vas US Carotid (at [facility] Only)  Result Date: 01/13/2019 Carotid Arterial Duplex Study Indications:       CVA. Risk Factors:      Hypertension, Diabetes. Comparison Study:  no prior Performing Technologist: June Leap RDMS, RVT  Examination Guidelines: A complete evaluation includes B-mode imaging, spectral Doppler, color Doppler, and power Doppler as needed of all accessible portions of each vessel. Bilateral testing is considered an integral part of a complete examination. Limited examinations for reoccurring indications may be performed as noted.  Right Carotid Findings: +----------+--------+--------+--------+------------------+------------------+           PSV cm/sEDV cm/sStenosisPlaque DescriptionComments           +----------+--------+--------+--------+------------------+------------------+ CCA Prox  129     18                                                    +----------+--------+--------+--------+------------------+------------------+  CCA Distal81      11                                                   +----------+--------+--------+--------+------------------+------------------+ ICA Prox  49      15      Normal                    intimal thickening +----------+--------+--------+--------+------------------+------------------+ ICA Distal62      18                                                   +----------+--------+--------+--------+------------------+------------------+ ECA       86      17                                                   +----------+--------+--------+--------+------------------+------------------+ +----------+--------+-------+----------------+-------------------+           PSV cm/sEDV cmsDescribe        Arm Pressure (mmHG) +----------+--------+-------+----------------+-------------------+ Subclavian190            Multiphasic, WNL                    +----------+--------+-------+----------------+-------------------+ +---------+--------+--+--------+--+---------+ VertebralPSV cm/s35EDV cm/s11Antegrade +---------+--------+--+--------+--+---------+  Left Carotid Findings: +----------+--------+--------+--------+------------------+------------------+           PSV cm/sEDV cm/sStenosisPlaque DescriptionComments           +----------+--------+--------+--------+------------------+------------------+ CCA Prox  77      18                                                   +----------+--------+--------+--------+------------------+------------------+ CCA Distal68      12                                                   +----------+--------+--------+--------+------------------+------------------+ ICA Prox  51      21      Normal                    intimal thickening +----------+--------+--------+--------+------------------+------------------+ ICA Distal49      20                                                    +----------+--------+--------+--------+------------------+------------------+ ECA       84      14                                                   +----------+--------+--------+--------+------------------+------------------+ +----------+--------+--------+----------------+-------------------+  PSV cm/sEDV cm/sDescribe        Arm Pressure (mmHG) +----------+--------+--------+----------------+-------------------+ [id number]             Multiphasic, WNL                    +----------+--------+--------+----------------+-------------------+ +---------+--------+--+--------+-+---------+ VertebralPSV cm/s40EDV cm/s9Antegrade +---------+--------+--+--------+-+---------+  Summary: Right Carotid: The extracranial vessels were near-normal with only minimal wall                thickening or plaque. Left Carotid: The extracranial vessels were near-normal with only minimal wall               thickening or plaque. Vertebrals:  Bilateral vertebral arteries demonstrate antegrade flow. Subclavians: Normal flow hemodynamics were seen in bilateral subclavian              arteries. *See table(s) above for measurements and observations.  Electronically signed by Antony Contras MD on 01/13/2019 at 11:05:26 AM.    Final     PHYSICAL EXAM Obese young African-American male not in distress. . Afebrile. Head is nontraumatic. Neck is supple without bruit.    Cardiac exam no murmur or gallop. Lungs are clear to auscultation. Distal pulses are well felt. Neurological Exam ;  Sleepy but can be aroused.  Oriented x 3. Normal speech and language.eye movements full without nystagmus.fundi were not visualized. Vision acuity and fields appear normal. Hearing is normal. Palatal movements are normal. Face symmetric only subtle left nasolabial fold weakness when he smiles.. Tongue midline. Normal strength, tone, reflexes and coordination. Normal sensation. Gait  deferred. ASSESSMENT/PLAN Mr. GIBRIL MASTRO is a 38 y.o. male with history of HTN, DB, CKD stage 2 presenting with L sided weakness, having been excessively sleep for the past few days with an episode of incontinence.   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 Hyperdense R MCA M2. Hypodensity L medial basal ganglia ? Chronic infarct. ASPECTS 8    MRI  R basal ganglia and white matter infarcts. Mild small vessel disease. Old L thalamic infarct.   MRA head  No significant stenosis   MRA neck No significant stenosis    Carotid Doppler  B ICA w/o significant stenosis  2D Echo EF 40-45%. No source of embolus   TCD bubble negative for PFO   EEG no sz  Given low EF and embolic infarct , recommend TEE to further look for source of stroke. If neg, consider loop recorder to look for atrial fibrillation - TEE schedule full for tomorrow. Defer to attending and OP cardiology for followup.    LDL 78  HgbA1c 8.8  ANA pending   ESR pending    Homocysteine pending   Sickle cell screen pending  Antiphospholipid abx pending   NH4 pending   ESR 22 mm  Lovenox 60 mg sq daily for VTE prophylaxis  No antithrombotic prior to admission, now on aspirin 81 mg daily and clopidogrel 75 mg daily x 3 weeks then aspirin alone.   Therapy recommendations:  ? OP PT, OT eval pending    Disposition:  pending   Hypertension  Stable . Permissive hypertension (OK if < 220/120) but gradually normalize in 5-7 days . Trow-term BP goal normotensive  Hyperlipidemia  Home meds:  lipitor 40 Now on lipitor 80  LDL 78, goal < 70  Continue statin at discharge  Diabetes type II Uncontrolled  HgbA1c 8.8, goal <  7.0  Other Stroke Risk Factors  Former Cigarette smoker, quit 10 yrs ago  Former Cigar smoker, quit 4 yrs ago  Obesity, Body mass index is 38.58 kg/m., recommend weight loss,  diet and exercise as appropriate   HFrEF  Probably obstructive sleep apnea, OP evaluation recommended  Other Active Problems  CKD stage 3  Hospital day # 2  Recommend discharge home on dual antiplatelet therapy and aggressive risk factor modification.  Outpatient cardiology follow-up for TEE and cardiac monitoring.  Follow labs for hypercoagulable and vasculitis.  Follow-up in the outpatient stroke clinic in 6 weeks.  Mervin discussion with patient and wife and answered questions.  Patient counseled to keep outpatient appointment for sleep study for diagnosis of suspected sleep apnea.  I had a Jean discussion with patient and answered questions.  Greater than 50% time during this 25-minute visit was spent on counseling and coordination of care about his cryptogenic stroke and answering questions  Antony Contras, MD Medical Director [place] Pager: [phone] 01/13/2019 1:31 PM   To contact Stroke Continuity provider, please refer to [email or web address] After hours, contact General Neurology

## 2019-01-13 NOTE — Discharge Summary (Addendum)
Name: Harold Clements MRN: [id number] DOB: 1980/12/22 38 y.o. PCP: Patient, No Pcp Per  Date of Admission: 01/11/2019  9:47 AM Date of Discharge: 01/13/2019 Attending Physician: Joni Reining, DO  Discharge Diagnosis: 1. Right basal ganglia CVA    Discharge Medications: Allergies as of 01/13/2019      Reactions   Zithromax [azithromycin Dihydrate] Anaphylaxis   Kiwi Extract Other (See Comments)   Tongue goes numb      Medication List    TAKE these medications   amLODipine 10 MG tablet Commonly known as: NORVASC Take 1 tablet (10 mg total) by mouth daily.   aspirin 81 MG chewable tablet Chew 1 tablet (81 mg total) by mouth daily.   atorvastatin 80 MG tablet Commonly known as: LIPITOR Take 1 tablet (80 mg total) by mouth daily at 6 PM. What changed:   medication strength  how much to take   canagliflozin 100 MG Tabs tablet Commonly known as: INVOKANA Take 1 tablet (100 mg total) by mouth daily before breakfast.   carvedilol 25 MG tablet Commonly known as: COREG Take 1 tablet (25 mg total) by mouth 2 (two) times daily with a meal. What changed: how much to take   clopidogrel 75 MG tablet Commonly known as: PLAVIX Take 1 tablet (75 mg total) by mouth daily.   furosemide 40 MG tablet Commonly known as: LASIX Take 1 tablet (40 mg total) by mouth daily.   metFORMIN 1000 MG tablet Commonly known as: GLUCOPHAGE Take 1 tablet (1,000 mg total) by mouth 2 (two) times daily with a meal. IM program.   minoxidil 10 MG tablet Commonly known as: LONITEN Take 1 tablet (10 mg total) by mouth 2 (two) times daily. What changed: when to take this   NovoLIN 70/30 FlexPen (70-30) 100 UNIT/ML PEN Generic drug: Insulin Isophane & Regular Human Inject 26 Units into the skin 2 (two) times daily.   potassium chloride 20 MEQ packet Commonly known as: KLOR-CON Take 20 mEq by mouth daily.       Disposition and follow-up:   [name] was discharged from [facility] in Stable condition.  At the hospital follow up visit please address:  1.  Right basal ganglia CVA. Will need TEE to evaluate for potential embolic cause of stroke.    2. Poorly-controlled hypertension may be due to untreated sleep apnea.  Please assist patient in getting set up for sleep study.  3. HFrEF. EF 40-45%. Please assure patient has follow up with cardiology.  Labs / imaging needed at time of follow-up: TEE, sleep study  Pending labs/ test needing follow-up: none  Follow-up Appointments:  Follow-up Information    Croitoru, Mihai, MD Follow up in 1 week(s).   Specialty: Cardiology Contact information: [address] [place] [address] [phone]        Garvin Fila, MD Follow up in 6 week(s).   Specialties: Neurology, Radiology Contact information: [address] [place] [address] [phone]        [facility] Follow up on 02/08/2019.   Specialty: Family Medicine Why: Your appointment is at 2:50 pm. Please arrive early and bring: insurance card, picture ID, current medications. Contact information: [place] [phone] [facility] Follow up.   Specialty: Rehabilitation Why: The outpatient rehab will contact you for the first appointment Contact information: [place]  [id number] [place] [facility] Course by problem list: 38 y.o. malewith a PMHx of T2DM, poorly-controlled hypertension, CKD stage 3, chronic diastolic heart failure, morbid obesity who presented on 11/11/2020withtransient left-sided weakness and left-sided facial droop.   1. Right basal ganglia CVA:  Presented to ED for evaluation of progressive somnolence and weakness as well as a couple episodes of urinary incontinence. No tPA was administered as  weakness resolved shortly after arrival to the ED and he was outside of the tPa window.  MRI scan of the brain confirmed large area of acute infarctions in the right basal ganglia and right parietal white matter,.and MRA showed focal signal drop in the right MCA with no significant stenosis.  No evidence of cardiogenic source on echo.  He was started on aspirin and Plavix, and his atorvastatin was increased from 40mg  to 80mg  daily.  His antihypertensive medications were held for permissive hypertension. He had a carotid doppler which showed no significant stenosis in carotid arteries.  Neurology recommended a TEE to further look for source of stroke but schedule was full so it was decided to obtain this as an outpatient with cardiology.  He also had screening labs for other CVA risk factors including homocysteine, sickle cell screen, antiphospholipid syndrome screen, and ANA. Mildly elevated homocysteine. Remainder unremarkable. He was seen by OT and PT who recommended outpatient therapy.  By date of discharge he was not having weakness.  He was discharged home on 11/13.   2. HFrEF: Echocardiogram on arrival showed mildly reduced left ventricular ejection fraction of 40-45% (essentially unchanged from 11/2018 echo with EF 45-50%), mildly increased LVH, G1DD, and left ventricular global hypokinesis.  As his echo was essentially unchanged from prior available study and he did not have signs of volume overload, it was planned to have outpatient cardiology follow-up.  He was discharged on home furosemide 40mg .  3. Poorly-controlled hypertension:  His BP medications were held for permissive hypertension the first 2 days of his hospitalization.   His amlodipine and carvedilol were resumed prior to discharge with improvement in blood pressure.    4. TIIDM: A1c 8.8% on 11/12.  Had been prescribed 26u of 70/30 Novolin, Invokana 100mg , metformin 1000mg  BID at home.  He was given Lantus plus SSI while in the hospital.  He  was discharged on his home meds.  5. 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 STOP-BANG 6. Patient noted to be spontaneously falling asleep and was very tired throughout his hospitalization. Wife noted that this was not unlike his behavior at home. Discussed with patient and he agreed to sleep study. Will need to be set up by PCP at discharge.   Discharge Vitals:   BP (!) 151/95 (BP Location: Left Arm)   Pulse 72   Temp 98 F (36.7 C) (Oral)   Resp 18   Ht 5\' 9"  (1.753 m)   Wt 118.5 kg   SpO2 100%   BMI 38.58 kg/m   Pertinent Labs, Studies, and Procedures:  BMP Latest Ref Rng & Units 01/12/2019 01/11/2019 01/11/2019  Glucose 70 - 99 mg/dL 121(H) 207(H) 206(H)  BUN 6 - 20 mg/dL 20 27(H) 22(H)  Creatinine 0.61 - 1.24 mg/dL 2.02(H) 2.10(H) 2.18(H)  BUN/Creat Ratio 9 - 20 - - -  Sodium 135 - 145 mmol/L 144 144 141  Potassium 3.5 - 5.1 mmol/L 3.4(L) 4.1 4.0  Chloride 98 - 111 mmol/L 109 105 108  CO2 22 - 32 mmol/L 25 -  23  Calcium 8.9 - 10.3 mg/dL 9.6 - 9.5   Component     Latest Ref Rng & Units 01/12/2019  Anticardiolipin Ab,IgA,Qn     0 - 11 APL U/mL <9  Anticardiolipin Ab,IgG,Qn     0 - 14 GPL U/mL <9  Anticardiolipin Ab,IgM,Qn     0 - 12 MPL U/mL <9  PTT Lupus Anticoagulant     0.0 - 51.9 sec 36.5  DRVVT     0.0 - 47.0 sec 39.4  Phosphatydalserine, IgG     0 - 11 GPS IgG 4  Phosphatydalserine, IgM     0 - 25 MPS IgM 3  Phosphatydalserine, IgA     0 - 20 APS IgA 2  Lupus Anticoag Interp      Comment:  Sed Rate     0 - 16 mm/hr 22 (H)  Anti Nuclear Antibody (ANA)     Negative Negative  Sickle Cell Screen     Negative Negative  Homocysteine     0.0 - 14.5 umol/L 16.1 (H)   Component     Latest Ref Rng & Units 01/11/2019 01/12/2019  Cholesterol     0 - 200 mg/dL  134  Triglycerides     <150 mg/dL  118  HDL Cholesterol     >40 mg/dL  32 (L)  Total CHOL/HDL Ratio     RATIO  4.2  VLDL     0 - 40 mg/dL  24  LDL (calc)     0 - 99 mg/dL  78  Hemoglobin A1C     4.8 - 5.6 %  8.8 (H)   Mean Plasma Glucose     mg/dL  205.86  TSH     0.350 - 4.500 uIU/mL 2.986    MRI head (01/11/2019): Acute infarctions involving the right basal ganglia and adjacent white matter.  Mild chronic microvascular ischemic changes. Small chronic infarct of the left thalamus.  MRA head and neck (01/11/2019): No significant stenosis.  Echocardiogram (01/11/2019):  Left ventricular ejection fraction, by visual estimation, is 40 to 45%. The left ventricle has mild to moderately decreased function. Left ventricular septal wall thickness was mildly increased. Mildly increased left ventricular posterior wall thickness. There is mildly increased left ventricular hypertrophy. Left ventricular diastolic parameters are consistent with Grade I diastolic dysfunction (impaired relaxation). The left ventricle demonstrates global hypokinesis. Global right ventricle has normal systolic function.The right ventricular size is normal. No increase in right ventricular wall thickness. Left and right atrial size was normal. No valvular abnormalities. The inferior vena cava is normal in size with greater than 50% respiratory variability, suggesting right atrial pressure of 3 mmHg.  Carotid duplex (01/12/2019): Minimal wall thickening or plaque bilaterally  Transcranial doppler (01/12/2019): No HITS at rest or during Valsalva.     Discharge Instructions: Discharge Instructions    Ambulatory referral to Occupational Therapy   Complete by: As directed    Ambulatory referral to Physical Therapy   Complete by: As directed    Ambulatory referral to Speech Therapy   Complete by: As directed    Diet - low sodium heart healthy   Complete by: As directed    Increase activity slowly   Complete by: As directed     You were hospitalized at [facility] for a stroke which caused your weakness and facial droop. We think that your high blood pressure may have been part of the reason why you had a stroke, but there could be another  cause  as well. In order to prevent you from having another stroke, it will be important to get your blood pressure under better control and to go to your medical appointments.  You will be seeing a cardiologist next Friday. At that visit they will do a sleep study, to look for sleep apnea, and an imaging study of your heart, to look for possible reasons for your stroke. You will also be seeing Dr. Leonie Man, the neurologist you saw in the hospital, in 6 weeks.  Continue taking your aspirin and Plavix every day for 3 weeks. After 3 weeks, stop taking the Plavix but continue taking the aspirin.  It was a pleasure taking care of you.    Signed: [name], Medical Student 01/15/2019, 7:59 AM   Pager: [phone]

## 2019-01-13 NOTE — Plan of Care (Signed)
  Problem: Education: Goal: Knowledge of disease or condition will improve Outcome: Progressing Goal: Knowledge of secondary prevention will improve Outcome: Progressing Goal: Knowledge of patient specific risk factors addressed and post discharge goals established will improve Outcome: Progressing   Problem: Coping: Goal: Will verbalize positive feelings about self Outcome: Progressing Goal: Will identify appropriate support needs Outcome: Progressing   Problem: Health Behavior/Discharge Planning: Goal: Ability to manage health-related needs will improve Outcome: Progressing   Problem: Self-Care: Goal: Ability to participate in self-care as condition permits will improve Outcome: Progressing Goal: Verbalization of feelings and concerns over difficulty with self-care will improve Outcome: Progressing Goal: Ability to communicate needs accurately will improve Outcome: Progressing   Problem: Ischemic Stroke/TIA Tissue Perfusion: Goal: Complications of ischemic stroke/TIA will be minimized Outcome: Progressing   Problem: Education: Goal: Knowledge of General Education information will improve Description: Including pain rating scale, medication(s)/side effects and non-pharmacologic comfort measures Outcome: Progressing   Problem: Health Behavior/Discharge Planning: Goal: Ability to manage health-related needs will improve Outcome: Progressing   Problem: Clinical Measurements: Goal: Ability to maintain clinical measurements within normal limits will improve Outcome: Progressing Goal: Will remain free from infection Outcome: Progressing Goal: Diagnostic test results will improve Outcome: Progressing Goal: Respiratory complications will improve Outcome: Progressing Goal: Cardiovascular complication will be avoided Outcome: Progressing   Problem: Activity: Goal: Risk for activity intolerance will decrease Outcome: Progressing   Problem: Nutrition: Goal: Adequate  nutrition will be maintained Outcome: Progressing   Problem: Coping: Goal: Level of anxiety will decrease Outcome: Progressing   Problem: Elimination: Goal: Will not experience complications related to bowel motility Outcome: Progressing Goal: Will not experience complications related to urinary retention Outcome: Progressing   Problem: Pain Managment: Goal: General experience of comfort will improve Outcome: Progressing   Problem: Safety: Goal: Ability to remain free from injury will improve Outcome: Progressing   Problem: Skin Integrity: Goal: Risk for impaired skin integrity will decrease Outcome: Progressing   Ival Bible, BSN, RN

## 2019-01-15 LAB — ANTIPHOSPHOLIPID SYNDROME EVAL, BLD
Anticardiolipin IgA: <9 APL U/mL (ref 0–11)
Anticardiolipin IgG: <9 GPL U/mL (ref 0–14)
Anticardiolipin IgM: <9 MPL U/mL (ref 0–12)
DRVVT: 39.4 s (ref 0.0–47.0)
PTT Lupus Anticoagulant: 36.5 s (ref 0.0–51.9)
Phosphatydalserine, IgA: 2 APS IgA (ref 0–20)
Phosphatydalserine, IgG: 4 GPS IgG (ref 0–11)
Phosphatydalserine, IgM: 3 MPS IgM (ref 0–25)

## 2019-01-19 NOTE — Progress Notes (Signed)
Cardiology Office Note:    Date:  01/20/2019   ID:  Harold Clements, DOB [birth year], MRN [id number]  PCP:  Kerin Perna, NP  Cardiologist:  Sanda Klein, MD   Referring MD: No ref. provider found   Chief Complaint  Patient presents with   Follow-up    hypertension    History of Present Illness:    Harold Clements is a 38 y.o. male with a hx of multiple hospitalizations for hypertensive urgency, chronic diastolic heart failure, DM type II, CKD stage III, OSA intermittently compliant with CPAP, morbid obesity, and medication noncompliance.  He was seen on 11/28/2018 by Dr. Sallyanne Kuster for a new patient evaluation.  He had presented to the ER on 9/27 with shortness of breath.  History of renal artery ultrasound in 2018 that was negative for renal artery stenosis.  On arrival his BP was noted to be 255/180 mmHg.  He does have a history of noncompliance and renal insufficiency which complicates his management.  Echo during that admission showed an EF of 45 to 50%, moderate concentric LVH, elevated mean LA pressures, grade 2 diastolic dysfunction, moderate biatrial enlargement, trivial circumferential antral pericardial effusion, and no significant valvular abnormalities.  Baseline serum creatinine thought to be 1.7.  Medications were titrated as followed: Beta-blocker transition to carvedilol, hydralazine was stopped, started on minoxidil, continued amlodipine, and clonidine was discontinued for medication noncompliance and risk of rebound hypertension.  His 40 mg daily was resumed.  Unable to add an ace inhibitor, ARB, spironolactone due to renal insufficiency. Discharged on amlodipine, coreg, minoxidil, 40 mg lasix, 20 mEq K. I saw him in follow up on 12/13/18. Given the above, he was still quite hypertensive. I increased minoxidil to 10 mg BID and increased coreg to 25 mg BID. He had instructions to return with a BP log. In consultation with Dr. Sallyanne Kuster, consider adding spiro and stop K instead of  adding hydralazine.  He missed his follow up appt with me on 12/29/18. He unfortunately presented to [facility] 01/11/19 with stroke-like symptoms including left-sided weakness and confusion. Pressure on presentation was 169/112. CT head with right basal ganglia hypodensities suspicious for acute infarct, no hemorrhage.  Echo 01/11/19 with further decrease in EF to 40-45%, grade 1 DD, normal left and right atrial size. Carotid duplex 01/12/19 with minimal plaque bilaterally. MRI brain with acute infarctions involving the right basal ganglia and small chronic infarct of the left thalamus. Permissive HTN for 2 days following his stroke. Amlodipine and coreg were resumed at discharge with improvement in his BP. BP at discharge was 151/95. He was discharged on ASA and plavix as well as lipitor 80 mg. Neurology recommended OP TEE to rule out cardiac source of embolic stroke in the setting of reduced EF.   Per Dr. Clydene Fake note, ASA and plavix x 3 weeks, then ASA monotherapy thereafter.  He presents today for follow up. He appears depressed with flattened affect and is very hesitant to talk. His wife is with him and provides the majority of the history. He continues to have residual left sided weakness in his upper extremity. They have not been keeping a BP log. His pressure is surprisingly well-controlled today. He does describe one event in which he stood up from the couch and fell back down, reporting lightheadedness and dizziness. No syncope. He did not hit his head. I question orthostatic hypotension. He denies anginal symptoms, palpitations, and SOB.   Past Medical History:  Diagnosis Date   CKD (chronic kidney  disease) stage 2, GFR 60-89 ml/min    Diabetes mellitus without complication (HCC)    Ear pain 07/2017   Hypertension    Wasp sting 08/04/2016   of dorsum of left hand near thumb    No past surgical history on file.  Current Medications: Current Meds  Medication Sig   amLODipine  (NORVASC) 10 MG tablet Take 1 tablet (10 mg total) by mouth daily.   atorvastatin (LIPITOR) 80 MG tablet Take 1 tablet (80 mg total) by mouth daily at 6 PM.   canagliflozin (INVOKANA) 100 MG TABS tablet Take 1 tablet (100 mg total) by mouth daily before breakfast.   carvedilol (COREG) 25 MG tablet Take 1 tablet (25 mg total) by mouth 2 (two) times daily with a meal.   clopidogrel (PLAVIX) 75 MG tablet Take 1 tablet (75 mg total) by mouth daily.   furosemide (LASIX) 40 MG tablet Take 1 tablet (40 mg total) by mouth daily.   Insulin Isophane & Regular Human (NOVOLIN 70/30 FLEXPEN) (70-30) 100 UNIT/ML PEN Inject 26 Units into the skin 2 (two) times daily.   metFORMIN (GLUCOPHAGE) 1000 MG tablet Take 1 tablet (1,000 mg total) by mouth 2 (two) times daily with a meal. IM program.   metFORMIN (GLUCOPHAGE) 1000 MG tablet Take 1 tablet (1,000 mg total) by mouth 2 (two) times daily with a meal. IM program.   minoxidil (LONITEN) 10 MG tablet Take 1 tablet (10 mg total) by mouth 2 (two) times daily.   potassium chloride (KLOR-CON) 20 MEQ packet Take 20 mEq by mouth daily.   [DISCONTINUED] furosemide (LASIX) 40 MG tablet Take 1 tablet (40 mg total) by mouth daily.   [DISCONTINUED] potassium chloride (KLOR-CON) 20 MEQ packet Take 20 mEq by mouth daily.     Allergies:   Zithromax [azithromycin dihydrate] and Kiwi extract   Social History   Socioeconomic History   Marital status: Married    Spouse name: Not on file   Number of children: Not on file   Years of education: Not on file   Highest education level: Not on file  Occupational History   Occupation: Engineer, agricultural  Social Needs   Financial resource strain: Not on file   Food insecurity    Worry: Not on file    Inability: Not on file   Transportation needs    Medical: Not on file    Non-medical: Not on file  Tobacco Use   Smoking status: Former Smoker    Years: 4.00    Types: Cigars    Quit date: 2010    Years  since quitting: 10.8   Smokeless tobacco: Never Used  Substance and Sexual Activity   Alcohol use: No   Drug use: No   Sexual activity: Yes  Lifestyle   Physical activity    Days per week: Not on file    Minutes per session: Not on file   Stress: Not on file  Relationships   Social connections    Talks on phone: Not on file    Gets together: Not on file    Attends religious service: Not on file    Active member of club or organization: Not on file    Attends meetings of clubs or organizations: Not on file    Relationship status: Not on file  Other Topics Concern   Not on file  Social History Narrative   Not on file     Family History: The patient's family history includes Diabetes in his  mother; Heart disease in his mother; Hypertension in his father and mother.  ROS:   Please see the history of present illness.     All other systems reviewed and are negative.  EKGs/Labs/Other Studies Reviewed:    The following studies were reviewed today:  Echo 01/11/19: 1. Left ventricular ejection fraction, by visual estimation, is 40 to 45%. The left ventricle has mild to moderately decreased function. Left ventricular septal wall thickness was mildly increased. Mildly increased left ventricular posterior wall  thickness. There is mildly increased left ventricular hypertrophy.  2. Left ventricular diastolic parameters are consistent with Grade I diastolic dysfunction (impaired relaxation).  3. The left ventricle demonstrates global hypokinesis.  4. Global right ventricle has normal systolic function.The right ventricular size is normal. No increase in right ventricular wall thickness.  5. Left atrial size was normal.  6. Right atrial size was normal.  7. The mitral valve is normal in structure. Trace mitral valve regurgitation. No evidence of mitral stenosis.  8. The tricuspid valve is normal in structure. Tricuspid valve regurgitation is not demonstrated.  9. The aortic  valve is normal in structure. Aortic valve regurgitation is not visualized. No evidence of aortic valve sclerosis or stenosis. 10. The pulmonic valve was normal in structure. Pulmonic valve regurgitation is not visualized. 11. The inferior vena cava is normal in size with greater than 50% respiratory variability, suggesting right atrial pressure of 3 mmHg.  EKG:  EKG is  ordered today.  The ekg ordered today demonstrates sinus rhythm with HR 97, inferior Q waves  Recent Labs: 11/27/2018: B Natriuretic Peptide 402.3 01/11/2019: ALT 13; Hemoglobin 15.6; Platelets 229; TSH 2.986 01/12/2019: BUN 20; Creatinine, Ser 2.02; Potassium 3.4; Sodium 144  Recent Lipid Panel    Component Value Date/Time   CHOL 134 01/12/2019 0339   TRIG 118 01/12/2019 0339   HDL 32 (L) 01/12/2019 0339   CHOLHDL 4.2 01/12/2019 0339   VLDL 24 01/12/2019 0339   LDLCALC 78 01/12/2019 0339    Physical Exam:    VS:  BP 110/80    Pulse (!) 104    Ht 5\' 9"  (1.753 m)    Wt 256 lb (116.1 kg)    SpO2 96%    BMI 37.80 kg/m     Wt Readings from Last 3 Encounters:  01/20/19 256 lb (116.1 kg)  01/11/19 261 lb 3.9 oz (118.5 kg)  12/13/18 267 lb (121.1 kg)     GEN:  Well nourished, well developed in no acute distress HEENT: Normal NECK: No JVD; No carotid bruits LYMPHATICS: No lymphadenopathy CARDIAC:RRR, no murmurs, rubs, gallops RESPIRATORY:  Clear to auscultation without rales, wheezing or rhonchi  ABDOMEN: Soft, non-tender, non-distended MUSCULOSKELETAL:  No edema; No deformity  SKIN: Warm and dry NEUROLOGIC:  Alert and oriented x 3 PSYCHIATRIC:  Normal affect   ASSESSMENT:    1. Hypertension associated with diabetes ([place])   2. CKD (chronic kidney disease) stage 2, GFR 60-89 ml/min   3. Obesity, Class III, BMI 40-49.9 (morbid obesity) ([place])   4. Snoring   5. Daytime sleepiness   6. Chronic combined systolic and diastolic heart failure (HCC)   7. Stage 3a chronic kidney disease   8. Cerebrovascular accident  (CVA), unspecified mechanism ([place])   9. Hypoxia    PLAN:    In order of problems listed above:  Hypertension - coreg 25 mg, amlodipine 10 mg, minoxidil 10 mg BID - his pressure is surprisingly well-controlled today - will continue the present medications -  they will keep a two-week BP log and I will see them back - orthostatic vital today were negative for hypotension   Hypokalemia  - K 3.4 at discharge - will repeat BMP today for renal function and K   Chronic systolic and diastolic heart failure - EF now further reduced to 40-45% - 40 mg lasix with 20 mEq potassium - encouraged low salt diet, daily weights, 2L fluid restriction - BMP today   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 He used a CPAP during his last hospitalization.  - he did not make it for his COVID test and had to cancel his sleep study - untreated OSA is likely contributing to his difficult to treat hypertension   Hyperlipidemia with LDL goal < 70 - lipitor 80 mg 01/12/2019: Cholesterol 134; HDL 32; LDL Cholesterol 78; Triglycerides 118; VLDL 24   Diabetes - A1c is 8.8% - on metformin and ?invokana - invokana has been unaffordable so far, PCP is working with them on this   Follow up with me in 2 weeks for BP. TEE and sleep study.   Medication Adjustments/Labs and Tests Ordered: Current medicines are reviewed at length with the patient today.  Concerns regarding medicines are outlined above.  Orders Placed This Encounter  Procedures   Basic metabolic panel   EKG [id number]   ECHO TEE   Split night study   Meds ordered this encounter  Medications   furosemide (LASIX) 40  MG tablet    Sig: Take 1 tablet (40 mg total) by mouth daily.    Dispense:  30 tablet    Refill:  0   potassium chloride (KLOR-CON) 20 MEQ packet    Sig: Take 20 mEq by mouth daily.    Dispense:  90 packet    Refill:  1    Signed, [name], [place]  01/20/2019 3:07 PM    [facility]

## 2019-01-20 ENCOUNTER — Encounter: Payer: Self-pay | Admitting: Physician Assistant

## 2019-01-20 ENCOUNTER — Ambulatory Visit: Payer: Medicaid Other | Admitting: Physician Assistant

## 2019-01-20 ENCOUNTER — Other Ambulatory Visit: Payer: Self-pay

## 2019-01-20 ENCOUNTER — Telehealth (INDEPENDENT_AMBULATORY_CARE_PROVIDER_SITE_OTHER): Payer: Medicaid Other | Admitting: Primary Care

## 2019-01-20 VITALS — BP 110/80 | HR 104 | Ht 69.0 in | Wt 256.0 lb

## 2019-01-20 DIAGNOSIS — I10 Essential (primary) hypertension: Secondary | ICD-10-CM

## 2019-01-20 DIAGNOSIS — R400 Somnolence: Secondary | ICD-10-CM | POA: Diagnosis not present

## 2019-01-20 DIAGNOSIS — E1122 Type 2 diabetes mellitus with diabetic chronic kidney disease: Secondary | ICD-10-CM | POA: Diagnosis not present

## 2019-01-20 DIAGNOSIS — E1159 Type 2 diabetes mellitus with other circulatory complications: Secondary | ICD-10-CM | POA: Diagnosis not present

## 2019-01-20 DIAGNOSIS — I5042 Chronic combined systolic (congestive) and diastolic (congestive) heart failure: Secondary | ICD-10-CM

## 2019-01-20 DIAGNOSIS — N184 Chronic kidney disease, stage 4 (severe): Secondary | ICD-10-CM | POA: Diagnosis not present

## 2019-01-20 DIAGNOSIS — I639 Cerebral infarction, unspecified: Secondary | ICD-10-CM

## 2019-01-20 DIAGNOSIS — N1831 Chronic kidney disease, stage 3a: Secondary | ICD-10-CM

## 2019-01-20 DIAGNOSIS — R0683 Snoring: Secondary | ICD-10-CM

## 2019-01-20 DIAGNOSIS — N182 Chronic kidney disease, stage 2 (mild): Secondary | ICD-10-CM | POA: Diagnosis not present

## 2019-01-20 DIAGNOSIS — Z7689 Persons encountering health services in other specified circumstances: Secondary | ICD-10-CM

## 2019-01-20 DIAGNOSIS — I152 Hypertension secondary to endocrine disorders: Secondary | ICD-10-CM

## 2019-01-20 DIAGNOSIS — Z09 Encounter for follow-up examination after completed treatment for conditions other than malignant neoplasm: Secondary | ICD-10-CM | POA: Diagnosis not present

## 2019-01-20 DIAGNOSIS — R0902 Hypoxemia: Secondary | ICD-10-CM | POA: Diagnosis not present

## 2019-01-20 MED ORDER — ATORVASTATIN CALCIUM 80 MG PO TABS: 80.0000 mg | ORAL_TABLET | Freq: Every day | ORAL | 1 refills | Status: DC

## 2019-01-20 MED ORDER — AMLODIPINE BESYLATE 10 MG PO TABS: 10.0000 mg | ORAL_TABLET | Freq: Every day | ORAL | 1 refills | Status: DC

## 2019-01-20 MED ORDER — POTASSIUM CHLORIDE 20 MEQ PO PACK: 20.0000 meq | PACK | Freq: Every day | ORAL | 1 refills | Status: DC

## 2019-01-20 MED ORDER — CANAGLIFLOZIN 100 MG PO TABS: 100.0000 mg | ORAL_TABLET | Freq: Every day | ORAL | 0 refills | Status: DC

## 2019-01-20 MED ORDER — FUROSEMIDE 40 MG PO TABS: 40.0000 mg | ORAL_TABLET | Freq: Every day | ORAL | 0 refills | Status: DC

## 2019-01-20 NOTE — Progress Notes (Signed)
Virtual Visit via Telephone Note  I connected with Harold Clements on 01/20/19 at  9:30 AM EST by telephone and verified that I am speaking with the correct person using two identifiers.   I discussed the limitations, risks, security and privacy concerns of performing an evaluation and management service by telephone and the availability of in person appointments. I also discussed with the patient that there may be a patient responsible charge related to this service. The patient expressed understanding and agreed to proceed.   History of Present Illness: [name] is having a tele visit to establish care with a PCP and hospital follow up. Presently has a CVA with weakness left side with decrease ROM with numbness and tingling , when he wakes up in the morning he is off balance - increase risk of falls. Staggers when walking.   Review of Systems  Musculoskeletal: Positive for falls.       Unstable gait  Neurological: Positive for speech change and weakness.  All other systems reviewed and are negative.  Observations/Objective: Diagnoses and all orders for this visit:  Ischemic stroke [facility]) Followed by neurology with weakness and hospital notes indicating the need. -     Ambulatory referral to Physical Therapy -     Ambulatory referral to Occupational Therapy  Type 2 diabetes mellitus with stage 4 chronic kidney disease, without Power-term current use of insulin (HCC) ADA recommendations 6.5 hemoglobin reference discussed foods that are high in carbohydrates are the following rice, potatoes, breads, sugars, and pastas. Reduction in the intake (eating) will assist in lowering your blood sugars.  Encounter to establish care Juluis Mire, NP-C will be your  (PCP) mastered prepared that is able to that will  diagnosed and treatment able to answer health concern as well as continuing care of varied medical conditions, not limited by cause, organ system, or diagnosis.   Hospital  discharge follow-up New diagnosis Right basal ganglia CVA. Poorly-controlled hypertension  and diabetes. Needing to establish PCP followed by neurology and cardiology.  Hypertension associated with diabetes [facility]) Cardiology following onpresently amlodipine and carvedilol today due to high diastolic pressures want to obtain gradual normalization of bp overal several days   Other orders -     canagliflozin (INVOKANA) 100 MG TABS tablet; Take 1 tablet (100 mg total) by mouth daily before breakfast. -     potassium chloride (KLOR-CON) 20 MEQ packet; Take 20 mEq by mouth daily. -     atorvastatin (LIPITOR) 80 MG tablet; Take 1 tablet (80 mg total) by mouth daily at 6 PM.    Assessment and Plan:   Follow Up Instructions:    I discussed the assessment and treatment plan with the patient. The patient was provided an opportunity to ask questions and all were answered. The patient agreed with the plan and demonstrated an understanding of the instructions.   The patient was advised to call back or seek an in-person evaluation if the symptoms worsen or if the condition fails to improve as anticipated.  I provided 28 minutes of non-face-to-face time during this encounter. Includes reviewing chart, labs and imaging.   Kerin Perna, NP

## 2019-01-20 NOTE — Patient Instructions (Addendum)
Medication Instructions:  Your physician recommends that you continue on your current medications as directed. Please refer to the Current Medication list given to you today.  *If you need a refill on your cardiac medications before your next appointment, please call your pharmacy*  Lab Work: Your physician recommends that you return for lab work today: BMET  If you have labs (blood work) drawn today and your tests are completely normal, you will receive your results only by: Marland Kitchen MyChart Message (if you have MyChart) OR . A paper copy in the mail If you have any lab test that is abnormal or we need to change your treatment, we will call you to review the results.  Testing/Procedures:  Need to have done back to back if possible--sleep study one day, TEE the next:  Your physician has recommended that you have a sleep study. This test records several body functions during sleep, including: brain activity, eye movement, oxygen and carbon dioxide blood levels, heart rate and rhythm, breathing rate and rhythm, the flow of air through your mouth and nose, snoring, body muscle movements, and chest and belly movement.   Your physician has requested that you have a TEE. During a TEE, sound waves are used to create images of your heart. It provides your doctor with information about the size and shape of your heart and how well your heart's chambers and valves are working. In this test, a transducer is attached to the end of a flexible tube that's guided down your throat and into your esophagus (the tube leading from you mouth to your stomach) to get a more detailed image of your heart. You are not awake for the procedure. Please see the instruction sheet given to you today. For further information please visit [email or web address].  You will need to have a COVID test 3 days prior to your test:  [facility]  [place], [place]   M, T, Th, F Open 8-3:15    Wednesday 8-11:45   Saturday 9-12:45 [id number]   Instruct patients they should be in/request the Pre-Procedural/Pre-test lane - which is the far-RIGHT lane.   Follow-Up: At [facility], you and your health needs are our priority.  As part of our continuing mission to provide you with exceptional heart care, we have created designated Provider Care Teams.  These Care Teams include your primary Cardiologist (physician) and Advanced Practice Providers (APPs -  Physician Assistants and Nurse Practitioners) who all work together to provide you with the care you need, when you need it.  Your next appointment:   Tuesday, 01/31/19 at 1:30 PM  The format for your next appointment:   Virtual Visit ---VIDEO if you have a smart phone or laptop. Please have your blood pressure log available at the time of your visit. Please record your blood pressure and weight prior to your scheduled visit time.  Provider:   Fabian Sharp, PA

## 2019-01-21 LAB — BASIC METABOLIC PANEL
BUN/Creatinine Ratio: 10 (ref 9–20)
BUN: 25 mg/dL — ABNORMAL HIGH (ref 6–20)
CO2: 20 mmol/L (ref 20–29)
Calcium: 9.5 mg/dL (ref 8.7–10.2)
Chloride: 104 mmol/L (ref 96–106)
Creatinine, Ser: 2.43 mg/dL — ABNORMAL HIGH (ref 0.76–1.27)
GFR calc Af Amer: 38 mL/min/{1.73_m2} — ABNORMAL LOW (ref >59)
GFR calc non Af Amer: 33 mL/min/{1.73_m2} — ABNORMAL LOW (ref >59)
Glucose: 142 mg/dL — ABNORMAL HIGH (ref 65–99)
Potassium: 4.3 mmol/L (ref 3.5–5.2)
Sodium: 141 mmol/L (ref 134–144)

## 2019-01-21 NOTE — Progress Notes (Signed)
That is so sad! Way too young to have all these problems.Marland KitchenMarland Kitchen

## 2019-01-22 ENCOUNTER — Emergency Department (HOSPITAL_COMMUNITY)
Admission: EM | Admit: 2019-01-22 | Discharge: 2019-01-22 | Disposition: A | Discharge status: Home / Self Care | Payer: Medicaid Other | Attending: Emergency Medicine | Admitting: Emergency Medicine

## 2019-01-22 ENCOUNTER — Emergency Department (HOSPITAL_COMMUNITY): Payer: Medicaid Other

## 2019-01-22 ENCOUNTER — Other Ambulatory Visit: Payer: Self-pay

## 2019-01-22 ENCOUNTER — Encounter (HOSPITAL_COMMUNITY): Payer: Self-pay | Admitting: Emergency Medicine

## 2019-01-22 ENCOUNTER — Other Ambulatory Visit: Payer: Self-pay | Admitting: Physician Assistant

## 2019-01-22 DIAGNOSIS — N1831 Chronic kidney disease, stage 3a: Secondary | ICD-10-CM | POA: Diagnosis not present

## 2019-01-22 DIAGNOSIS — E1122 Type 2 diabetes mellitus with diabetic chronic kidney disease: Secondary | ICD-10-CM | POA: Insufficient documentation

## 2019-01-22 DIAGNOSIS — I129 Hypertensive chronic kidney disease with stage 1 through stage 4 chronic kidney disease, or unspecified chronic kidney disease: Secondary | ICD-10-CM | POA: Insufficient documentation

## 2019-01-22 DIAGNOSIS — Z7984 Long term (current) use of oral hypoglycemic drugs: Secondary | ICD-10-CM | POA: Insufficient documentation

## 2019-01-22 DIAGNOSIS — R109 Unspecified abdominal pain: Secondary | ICD-10-CM | POA: Diagnosis not present

## 2019-01-22 DIAGNOSIS — Z87891 Personal history of nicotine dependence: Secondary | ICD-10-CM | POA: Insufficient documentation

## 2019-01-22 DIAGNOSIS — Z79899 Other long term (current) drug therapy: Secondary | ICD-10-CM | POA: Diagnosis not present

## 2019-01-22 DIAGNOSIS — N182 Chronic kidney disease, stage 2 (mild): Secondary | ICD-10-CM

## 2019-01-22 LAB — COMPREHENSIVE METABOLIC PANEL
ALT: 12 U/L (ref 0–44)
AST: 38 U/L (ref 15–41)
Albumin: 3.8 g/dL (ref 3.5–5.0)
Alkaline Phosphatase: 63 U/L (ref 38–126)
Anion gap: 10 (ref 5–15)
BUN: 17 mg/dL (ref 6–20)
CO2: 26 mmol/L (ref 22–32)
Calcium: 9.4 mg/dL (ref 8.9–10.3)
Chloride: 105 mmol/L (ref 98–111)
Creatinine, Ser: 1.91 mg/dL — ABNORMAL HIGH (ref 0.61–1.24)
GFR calc Af Amer: 51 mL/min — ABNORMAL LOW (ref >60)
GFR calc non Af Amer: 44 mL/min — ABNORMAL LOW (ref >60)
Glucose, Bld: 121 mg/dL — ABNORMAL HIGH (ref 70–99)
Potassium: 5.6 mmol/L — ABNORMAL HIGH (ref 3.5–5.1)
Sodium: 141 mmol/L (ref 135–145)
Total Bilirubin: 1.7 mg/dL — ABNORMAL HIGH (ref 0.3–1.2)
Total Protein: 7.4 g/dL (ref 6.5–8.1)

## 2019-01-22 LAB — URINALYSIS, ROUTINE W REFLEX MICROSCOPIC
Bacteria, UA: NONE SEEN
Bilirubin Urine: NEGATIVE
Glucose, UA: NEGATIVE mg/dL
Ketones, ur: NEGATIVE mg/dL
Leukocytes,Ua: NEGATIVE
Nitrite: NEGATIVE
Protein, ur: 100 mg/dL — AB
Specific Gravity, Urine: 1.017 (ref 1.005–1.030)
pH: 5 (ref 5.0–8.0)

## 2019-01-22 LAB — LIPASE, BLOOD: Lipase: 30 U/L (ref 11–51)

## 2019-01-22 LAB — CBG MONITORING, ED: Glucose-Capillary: 91 mg/dL (ref 70–99)

## 2019-01-22 MED ORDER — MORPHINE SULFATE (PF) 4 MG/ML IV SOLN
4.0000 mg | Freq: Once | INTRAVENOUS | Status: DC
  Filled 2019-01-22: qty 1

## 2019-01-22 MED ORDER — KETOROLAC TROMETHAMINE 15 MG/ML IJ SOLN: 15.0000 mg | Freq: Once | INTRAMUSCULAR | Status: DC

## 2019-01-22 MED ORDER — MORPHINE SULFATE (PF) 4 MG/ML IV SOLN
4.0000 mg | Freq: Once | INTRAVENOUS | Status: AC
  Administered 2019-01-22: 4 mg via SUBCUTANEOUS
  Filled 2019-01-22: qty 1

## 2019-01-22 NOTE — ED Triage Notes (Signed)
C/o R sided flank pain x 2 days.  Denies nausea, vomiting, and urinary complaints.

## 2019-01-22 NOTE — ED Triage Notes (Signed)
CBG 91 

## 2019-01-22 NOTE — ED Triage Notes (Signed)
Difficult stick. Consult to IV team.

## 2019-01-22 NOTE — ED Triage Notes (Signed)
Claiborne Billings ,PA used [place] to draw labs. When lab called to report CBC was clotted Claiborne Billings ,Pa was updated.

## 2019-01-22 NOTE — Discharge Instructions (Signed)
Take Tylenol for pain as needed Please follow up with your doctor Return if worsening

## 2019-01-22 NOTE — ED Provider Notes (Signed)
[facility] EMERGENCY DEPARTMENT Provider Note   CSN: [id number] Arrival date & time: 01/22/19  1036     History   Chief Complaint Chief Complaint  Patient presents with  . Flank Pain    HPI Harold Clements is a 38 y.o. male with history of hypertension, CKD, CHF EF 40-45%, diabetes, recent stroke who presents with right flank pain.  He states that for the past 2 days he has been having some right-sided flank pain which radiates to the right side of his abdomen.  Pain has gradually worsened and has become constant he cannot get comfortable.  He has not tried anything for pain.  Nothing makes it better.  Movement makes it worse.  He has never had this pain before.  The pain is severe in nature.  He denies fever, chills, chest pain, shortness of breath, nausea, vomiting, diarrhea, urinary symptoms.  He just hospitalized for an acute right basal ganglia CVA and was discharged from the hospital on November 13.     HPI  Past Medical History:  Diagnosis Date  . 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 Diabetes mellitus without complication ([place])   . Ear pain 07/2017  . Hypertension   . Wasp sting 08/04/2016   of dorsum of left hand near thumb    Patient Active Problem List   Diagnosis Date Noted  . Hypertensive heart disease 01/12/2019  . Ischemic stroke ([place]) 01/11/2019  . Stroke ([place]) 01/11/2019  . Hypertensive emergency 11/28/2018  . Hypertensive crisis 11/27/2018  . Obesity, Class III, BMI 40-49.9 (morbid obesity) ([place]) 11/27/2018  . Mastoiditis 08/12/2017  . Chronic kidney disease, stage 3a 08/17/2016  . Hypertension associated with diabetes ([place]) 08/06/2016  . Diabetes ([place]) 08/06/2016    History reviewed. No pertinent surgical history.      Home Medications    Prior to Admission medications   Medication Sig Start Date End Date Taking? Authorizing Provider  amLODipine (NORVASC) 10 MG tablet Take 1 tablet (10 mg total) by mouth  daily. 01/20/19 02/19/19  Kerin Perna, NP  aspirin 81 MG chewable tablet Chew 1 tablet (81 mg total) by mouth daily. 01/14/19   Bloomfield, Carley D, DO  atorvastatin (LIPITOR) 80 MG tablet Take 1 tablet (80 mg total) by mouth daily at 6 PM. 01/20/19   Kerin Perna, NP  canagliflozin [facility]) 100 MG TABS tablet Take 1 tablet (100 mg total) by mouth daily before breakfast. 01/20/19   Kerin Perna, NP  carvedilol (COREG) 25 MG tablet Take 1 tablet (25 mg total) by mouth 2 (two) times daily with a meal. 12/13/18 01/20/19  Duke, Tami Lin, PA  clopidogrel (PLAVIX) 75 MG tablet Take 1 tablet (75 mg total) by mouth daily. 01/14/19   Bloomfield, Carley D, DO  furosemide (LASIX) 40 MG tablet Take 1 tablet (40 mg total) by mouth daily. 01/20/19 02/19/19  [name], PA  Insulin Isophane & Regular Human (NOVOLIN 70/30 FLEXPEN) (70-30) 100 UNIT/ML PEN Inject 26 Units into the skin 2 (two) times daily. 01/13/19   Bloomfield, Carley D, DO  metFORMIN (GLUCOPHAGE) 1000 MG tablet Take 1 tablet (1,000 mg total) by mouth 2 (two) times daily with a meal. IM program. 08/13/17 01/20/19  Molt, Romelle Starcher, DO  metFORMIN (GLUCOPHAGE) 1000 MG tablet Take 1 tablet (1,000 mg total) by mouth 2 (two) times daily with a meal. IM program. 01/13/19 02/12/19  Bloomfield, Nila Nephew D, DO  minoxidil (LONITEN) 10 MG tablet  Take 1 tablet (10 mg total) by mouth 2 (two) times daily. 12/13/18 01/20/19  DukeTami Lin, PA  potassium chloride (KLOR-CON) 20 MEQ packet Take 20 mEq by mouth daily. 01/20/19 02/19/19  [name], PA    Family History Family History  Problem Relation Age of Onset  . Hypertension Mother   . Diabetes Mother   . Heart disease Mother   . Hypertension Father     Social History Social History   Tobacco Use  . Smoking status: Former Smoker    Years: 4.00    Types: Cigars    Quit date: 2010    Years since quitting: 10.8  . Smokeless tobacco: Never Used   Substance Use Topics  . Alcohol use: No  . Drug use: No     Allergies   Zithromax [azithromycin dihydrate] and Kiwi extract   Review of Systems Review of Systems  Constitutional: Negative for chills and fever.  Respiratory: Negative for cough and shortness of breath.   Cardiovascular: Negative for chest pain.  Gastrointestinal: Positive for abdominal pain. Negative for diarrhea, nausea and vomiting.  Genitourinary: Positive for flank pain. Negative for difficulty urinating, dysuria and hematuria.  All other systems reviewed and are negative.    Physical Exam Updated Vital Signs BP (!) 167/114 (BP Location: Right Arm)   Pulse 77   Temp 98 F (36.7 C) (Oral)   Resp 16   SpO2 99%   Physical Exam Vitals signs and nursing note reviewed.  Constitutional:      General: He is not in acute distress.    Appearance: He is well-developed. He is obese. He is not ill-appearing.  HENT:     Head: Normocephalic and atraumatic.  Eyes:     General: No scleral icterus.       Right eye: No discharge.        Left eye: No discharge.     Conjunctiva/sclera: Conjunctivae normal.     Pupils: Pupils are equal, round, and reactive to light.  Neck:     Musculoskeletal: Normal range of motion.  Cardiovascular:     Rate and Rhythm: Normal rate and regular rhythm.  Pulmonary:     Effort: Pulmonary effort is normal. No respiratory distress.     Breath sounds: Normal breath sounds.  Abdominal:     General: There is no distension.     Palpations: Abdomen is soft.     Tenderness: There is no abdominal tenderness.     Comments: Mild R CVA tenderness  Skin:    General: Skin is warm and dry.  Neurological:     Mental Status: He is alert and oriented to person, place, and time.  Psychiatric:        Behavior: Behavior normal.      ED Treatments / Results  Labs (all labs ordered are listed, but only abnormal results are displayed) Labs Reviewed  URINALYSIS, ROUTINE W REFLEX MICROSCOPIC -  Abnormal; Notable for the following components:      Result Value   Hgb urine dipstick SMALL (*)    Protein, ur 100 (*)    All other components within normal limits  COMPREHENSIVE METABOLIC PANEL - Abnormal; Notable for the following components:   Potassium 5.6 (*)    Glucose, Bld 121 (*)    Creatinine, Ser 1.91 (*)    Total Bilirubin 1.7 (*)    GFR calc non Af Amer 44 (*)    GFR calc Af Amer 51 (*)    All  other components within normal limits  LIPASE, BLOOD  CBC WITH DIFFERENTIAL/PLATELET  CBG MONITORING, ED    EKG None  Radiology Ct Renal Stone Study  Result Date: 01/22/2019 CLINICAL DATA:  Flank pain.  Ureteral stone suspected. EXAM: CT ABDOMEN AND PELVIS WITHOUT CONTRAST TECHNIQUE: Multidetector CT imaging of the abdomen and pelvis was performed following the standard protocol without IV contrast. COMPARISON:  None. FINDINGS: Lower chest: Heart mildly enlarged.  Lung bases are clear. Hepatobiliary: No focal liver abnormality is seen. No gallstones, gallbladder wall thickening, or biliary dilatation. Pancreas: Unremarkable. No pancreatic ductal dilatation or surrounding inflammatory changes. Spleen: Normal in size without focal abnormality. Adrenals/Urinary Tract: In overall no adrenal masses size, orientation. Kidneys normal and position. Mild contour lobulation bilaterally. No masses, stones or hydronephrosis. Ureters are normal in course and in caliber. Bladder is unremarkable. Stomach/Bowel: Stomach is within normal limits. Appendix appears normal. No evidence of bowel wall thickening, distention, or inflammatory changes. Vascular/Lymphatic: No significant vascular findings are present. No enlarged abdominal or pelvic lymph nodes. Reproductive: Unremarkable. Other: No abdominal wall hernia or abnormality. No abdominopelvic ascites. Musculoskeletal: No fracture or acute finding. No osteoblastic or osteolytic lesions. IMPRESSION: 1. No acute findings. 2. No renal or ureteral stones or  obstructive uropathy. No findings to account for the patient's flank pain. Electronically Signed   By: Lajean Manes M.D.   On: 01/22/2019 13:08    Procedures Ultrasound ED Peripheral IV (Provider)  Date/Time: 01/22/2019 4:40 PM Performed by: Recardo Evangelist, PA-C Authorized by: Recardo Evangelist, PA-C   Procedure details:    Indications: multiple failed IV attempts     Skin Prep: chlorhexidine gluconate     Location:  Right AC   Angiocath:  20 G   Bedside Ultrasound Guided: Yes     Images: not archived     Patient tolerated procedure without complications: Yes     Dressing applied: Yes     (including critical care time)    Medications Ordered in ED Medications  morphine 4 MG/ML injection 4 mg (4 mg Subcutaneous Given 01/22/19 1357)     Initial Impression / Assessment and Plan / ED Course  I have reviewed the triage vital signs and the nursing notes.  Pertinent labs & imaging results that were available during my care of the patient were reviewed by me and considered in my medical decision making (see chart for details).  38 year old male presents with R Flank pain for several days. He is hypertensive but otherwise vitals are normal. UA obtained showed small hgb and 100 protein. Labs, CT renal ordered.   RN has had a difficult time getting blood or IV. Morphine was given IM.  IV team was taking several hours so bedside US IV was attempted. Blood was obtained and IV inserted however after it was sent off the lab called and the CBC was clotted and hemolyzed. Shared visit with Dr. Roderic Palau. CMP did result - his kidney function returned and is improved from baseline. K is high however blood draw was traumatic and was hemolyzed. Do not feel redrawing CBC would add to his care. Pain is possibly MSK. He was encouraged to take Tylenol and f/u with his doctor. He was somewhat lethargic after morphine but verbalized understanding.  Final Clinical Impressions(s) / ED Diagnoses    Final diagnoses:  Right flank pain    ED Discharge Orders    None       Recardo Evangelist, PA-C 01/22/19 1644  Milton Ferguson, MD 01/23/19 [phone]

## 2019-01-22 NOTE — ED Notes (Signed)
Patient verbalizes understanding of discharge instructions . Opportunity for questions and answers were provided . Armband removed by staff ,Pt discharged from ED. W/C  offered at D/C  and Declined W/C at D/C and was escorted to lobby by RN.  

## 2019-01-23 ENCOUNTER — Telehealth: Payer: Self-pay | Admitting: Cardiovascular Disease

## 2019-01-23 ENCOUNTER — Other Ambulatory Visit (INDEPENDENT_AMBULATORY_CARE_PROVIDER_SITE_OTHER): Payer: Self-pay | Admitting: Primary Care

## 2019-01-23 ENCOUNTER — Telehealth (INDEPENDENT_AMBULATORY_CARE_PROVIDER_SITE_OTHER): Payer: Self-pay

## 2019-01-23 ENCOUNTER — Telehealth: Payer: Self-pay | Admitting: *Deleted

## 2019-01-23 DIAGNOSIS — I119 Hypertensive heart disease without heart failure: Secondary | ICD-10-CM | POA: Diagnosis not present

## 2019-01-23 MED ORDER — CANAGLIFLOZIN 100 MG PO TABS: 100.0000 mg | ORAL_TABLET | Freq: Every day | ORAL | 0 refills | Status: DC

## 2019-01-23 MED ORDER — CLOPIDOGREL BISULFATE 75 MG PO TABS: 75.0000 mg | ORAL_TABLET | Freq: Every day | ORAL | 0 refills | Status: DC

## 2019-01-23 MED ORDER — CARVEDILOL 25 MG PO TABS: 25.0000 mg | ORAL_TABLET | Freq: Two times a day (BID) | ORAL | 1 refills | Status: DC

## 2019-01-23 MED ORDER — ATORVASTATIN CALCIUM 80 MG PO TABS: 80.0000 mg | ORAL_TABLET | Freq: Every day | ORAL | 0 refills | Status: DC

## 2019-01-23 MED FILL — FUROSEMIDE 40 MG TAB: 40 | 30 days supply | Qty: 30 | Fill #0

## 2019-01-23 NOTE — Telephone Encounter (Signed)
Left message to return a call for sleep study appointment details. 

## 2019-01-23 NOTE — Telephone Encounter (Signed)
Medications sent to pharmacy as requested 

## 2019-01-23 NOTE — Telephone Encounter (Signed)
°*  STAT* If patient is at the pharmacy, call can be transferred to refill team.   1. Which medications need to be refilled? (please list name of each medication and dose if known) carvedilol (COREG) 25 MG tablet(Expired) clopidogrel (PLAVIX) 75 MG tablet  2. Which pharmacy/location (including street and city if local pharmacy) is medication to be sent to? [facility] ([place] - [address]  3. Do they need a 30 day or 90 day supply? [place]

## 2019-01-23 NOTE — Telephone Encounter (Signed)
FWD to PCP. Talibah Colasurdo S Ott Zimmerle, CMA  

## 2019-01-23 NOTE — Telephone Encounter (Signed)
Patient called to make a medication refill request for   atorvastatin (LIPITOR) 80 MG tablet   Patient was advice that medication was sent to pharmacy on 11-20. Patient called pharmacy and was advice that no medication was sent for the patient. PCP did a printed Rx instead of sending it electronically to pharmacy. Patient would like to use [facility] on [address].   Patient is wanting to know if she can send canagliflozin (INVOKANA) 100 MG TABS tablet  To [facility] since medication is to expensive at [facility] and if not can PCP send in a generic version of Rx.   Please advice  [phone]

## 2019-01-24 ENCOUNTER — Emergency Department (HOSPITAL_COMMUNITY): Payer: Medicaid Other

## 2019-01-24 ENCOUNTER — Encounter (HOSPITAL_COMMUNITY): Payer: Self-pay | Admitting: Emergency Medicine

## 2019-01-24 ENCOUNTER — Emergency Department (HOSPITAL_COMMUNITY)
Admission: EM | Admit: 2019-01-24 | Discharge: 2019-01-24 | Disposition: A | Discharge status: Home / Self Care | Payer: Medicaid Other | Attending: Emergency Medicine | Admitting: Emergency Medicine

## 2019-01-24 DIAGNOSIS — E1122 Type 2 diabetes mellitus with diabetic chronic kidney disease: Secondary | ICD-10-CM | POA: Diagnosis not present

## 2019-01-24 DIAGNOSIS — R1033 Periumbilical pain: Secondary | ICD-10-CM | POA: Insufficient documentation

## 2019-01-24 DIAGNOSIS — Z87891 Personal history of nicotine dependence: Secondary | ICD-10-CM | POA: Insufficient documentation

## 2019-01-24 DIAGNOSIS — E669 Obesity, unspecified: Secondary | ICD-10-CM | POA: Insufficient documentation

## 2019-01-24 DIAGNOSIS — Y939 Activity, unspecified: Secondary | ICD-10-CM | POA: Insufficient documentation

## 2019-01-24 DIAGNOSIS — Z6841 Body Mass Index (BMI) 40.0 and over, adult: Secondary | ICD-10-CM | POA: Insufficient documentation

## 2019-01-24 DIAGNOSIS — R1032 Left lower quadrant pain: Secondary | ICD-10-CM | POA: Diagnosis not present

## 2019-01-24 DIAGNOSIS — Y999 Unspecified external cause status: Secondary | ICD-10-CM | POA: Diagnosis not present

## 2019-01-24 DIAGNOSIS — Z7984 Long term (current) use of oral hypoglycemic drugs: Secondary | ICD-10-CM | POA: Insufficient documentation

## 2019-01-24 DIAGNOSIS — I129 Hypertensive chronic kidney disease with stage 1 through stage 4 chronic kidney disease, or unspecified chronic kidney disease: Secondary | ICD-10-CM | POA: Diagnosis not present

## 2019-01-24 DIAGNOSIS — N1831 Chronic kidney disease, stage 3a: Secondary | ICD-10-CM | POA: Diagnosis not present

## 2019-01-24 DIAGNOSIS — M545 Low back pain: Secondary | ICD-10-CM | POA: Diagnosis present

## 2019-01-24 DIAGNOSIS — S39012A Strain of muscle, fascia and tendon of lower back, initial encounter: Secondary | ICD-10-CM | POA: Diagnosis not present

## 2019-01-24 DIAGNOSIS — S39011A Strain of muscle, fascia and tendon of abdomen, initial encounter: Secondary | ICD-10-CM | POA: Diagnosis not present

## 2019-01-24 DIAGNOSIS — Z79899 Other long term (current) drug therapy: Secondary | ICD-10-CM | POA: Diagnosis not present

## 2019-01-24 DIAGNOSIS — X58XXXA Exposure to other specified factors, initial encounter: Secondary | ICD-10-CM | POA: Insufficient documentation

## 2019-01-24 DIAGNOSIS — T148XXA Other injury of unspecified body region, initial encounter: Secondary | ICD-10-CM

## 2019-01-24 DIAGNOSIS — Z7901 Long term (current) use of anticoagulants: Secondary | ICD-10-CM | POA: Diagnosis not present

## 2019-01-24 DIAGNOSIS — Y929 Unspecified place or not applicable: Secondary | ICD-10-CM | POA: Insufficient documentation

## 2019-01-24 DIAGNOSIS — M549 Dorsalgia, unspecified: Secondary | ICD-10-CM | POA: Diagnosis not present

## 2019-01-24 DIAGNOSIS — R1031 Right lower quadrant pain: Secondary | ICD-10-CM | POA: Diagnosis not present

## 2019-01-24 LAB — URINALYSIS, ROUTINE W REFLEX MICROSCOPIC
Bilirubin Urine: NEGATIVE
Glucose, UA: NEGATIVE mg/dL
Hgb urine dipstick: NEGATIVE
Ketones, ur: NEGATIVE mg/dL
Leukocytes,Ua: NEGATIVE
Nitrite: NEGATIVE
Protein, ur: 100 mg/dL — AB
Specific Gravity, Urine: 1.017 (ref 1.005–1.030)
pH: 5 (ref 5.0–8.0)

## 2019-01-24 LAB — BASIC METABOLIC PANEL
Anion gap: 8 (ref 5–15)
BUN: 17 mg/dL (ref 6–20)
CO2: 24 mmol/L (ref 22–32)
Calcium: 9.2 mg/dL (ref 8.9–10.3)
Chloride: 107 mmol/L (ref 98–111)
Creatinine, Ser: 1.88 mg/dL — ABNORMAL HIGH (ref 0.61–1.24)
GFR calc Af Amer: 52 mL/min — ABNORMAL LOW (ref >60)
GFR calc non Af Amer: 45 mL/min — ABNORMAL LOW (ref >60)
Glucose, Bld: 109 mg/dL — ABNORMAL HIGH (ref 70–99)
Potassium: 3.6 mmol/L (ref 3.5–5.1)
Sodium: 139 mmol/L (ref 135–145)

## 2019-01-24 LAB — CBC WITH DIFFERENTIAL/PLATELET
Abs Immature Granulocytes: 0.02 10*3/uL (ref 0.00–0.07)
Basophils Absolute: 0 10*3/uL (ref 0.0–0.1)
Basophils Relative: 0 %
Eosinophils Absolute: 0.1 10*3/uL (ref 0.0–0.5)
Eosinophils Relative: 2 %
HCT: 42.5 % (ref 39.0–52.0)
Hemoglobin: 14.2 g/dL (ref 13.0–17.0)
Immature Granulocytes: 0 %
Lymphocytes Relative: 39 %
Lymphs Abs: 1.8 10*3/uL (ref 0.7–4.0)
MCH: 26.7 pg (ref 26.0–34.0)
MCHC: 33.4 g/dL (ref 30.0–36.0)
MCV: 79.9 fL — ABNORMAL LOW (ref 80.0–100.0)
Monocytes Absolute: 0.4 10*3/uL (ref 0.1–1.0)
Monocytes Relative: 9 %
Neutro Abs: 2.3 10*3/uL (ref 1.7–7.7)
Neutrophils Relative %: 50 %
Platelets: 271 10*3/uL (ref 150–400)
RBC: 5.32 MIL/uL (ref 4.22–5.81)
RDW: 12.5 % (ref 11.5–15.5)
WBC: 4.5 10*3/uL (ref 4.0–10.5)
nRBC: 0 % (ref 0.0–0.2)

## 2019-01-24 MED ORDER — DIAZEPAM 5 MG PO TABS
5.0000 mg | ORAL_TABLET | Freq: Once | ORAL | Status: AC
  Administered 2019-01-24: 10:00:00 5 mg via ORAL
  Filled 2019-01-24: qty 1

## 2019-01-24 MED ORDER — GADOBUTROL 1 MMOL/ML IV SOLN
10.0000 mL | Freq: Once | INTRAVENOUS | Status: AC | PRN
  Administered 2019-01-24: 10 mL via INTRAVENOUS

## 2019-01-24 MED ORDER — METHOCARBAMOL 500 MG PO TABS: 500.0000 mg | ORAL_TABLET | Freq: Four times a day (QID) | ORAL | 0 refills | Status: DC | PRN

## 2019-01-24 MED ORDER — OXYCODONE-ACETAMINOPHEN 5-325 MG PO TABS
1.0000 | ORAL_TABLET | Freq: Once | ORAL | Status: AC
  Administered 2019-01-24: 1 via ORAL
  Filled 2019-01-24: qty 1

## 2019-01-24 NOTE — ED Notes (Signed)
Patient transported to MRI 

## 2019-01-24 NOTE — ED Provider Notes (Signed)
[facility] EMERGENCY DEPARTMENT Provider Note   CSN: [id number] Arrival date & time: 01/24/19  [id number]     History   Chief Complaint Chief Complaint  Patient presents with   Back Pain    HPI Harold Clements is a 38 y.o. male with PMH significant for CKD3, diabetes, HTN, h/o stroke, obesity who presents for persistent lower R flank pain for the past 3-4 days that comes and goes, described as sharp.  States rubbing the bottom of his abdomen during urination helps his pain.  Pain is worse with movement.  He denies hematuria, groin pain, joint pain.  Reports some dysuria.  Reports his urine stream is strong and feels he is able to empty his bladder.  He denies any prior surgeries.  Denies any family history of renal, prostate, bladder cancer.  He is a never smoker. He denies any difficulties with bowel movements and has a bowel movement at least twice per day which are soft.  He does have chronic kidney disease but has not established with nephrology.  He has been eating and drinking normally.  Both patient and wife deny any known injury or falls.  Wife notes he has been staggering more and off balance since his stroke a few weeks ago and has had a few episodes of urinary incontinence. 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  He has not been at work since his stroke.  Of note he was seen in [facility] ED 11/22 for same.  At that time CT renal study was negative and creatinine was better than baseline.  Was thought to be secondary to MSK etiology and discharged with Tylenol.      Past Medical History:  Diagnosis Date   CKD (chronic kidney disease) stage 2, GFR 60-89 ml/min    Diabetes mellitus without complication ([place])    Ear pain 07/2017   Hypertension    Wasp sting 08/04/2016   of dorsum of left hand near thumb    Patient Active Problem List   Diagnosis Date Noted   Hypertensive heart disease 01/12/2019   Ischemic  stroke ([place]) 01/11/2019   Stroke ([place]) 01/11/2019   Hypertensive emergency 11/28/2018   Hypertensive crisis 11/27/2018   Obesity, Class III, BMI 40-49.9 (morbid obesity) ([place]) 11/27/2018   Mastoiditis 08/12/2017   Chronic kidney disease, stage 3a 08/17/2016   Hypertension associated with diabetes ([place]) 08/06/2016   Diabetes ([place]) 08/06/2016    History reviewed. No pertinent surgical history.    Home Medications    Prior to Admission medications   Medication Sig Start Date End Date Taking? Authorizing Provider  amLODipine (NORVASC) 10 MG tablet Take 1 tablet (10 mg total) by mouth daily. 01/20/19 02/19/19  Kerin Perna, NP  atorvastatin (LIPITOR) 80 MG tablet Take 1 tablet (80 mg total) by mouth daily at 6 PM. 01/23/19   Kerin Perna, NP  canagliflozin [facility]) 100 MG TABS tablet Take 1 tablet (100 mg total) by mouth daily before breakfast. 01/23/19   Kerin Perna, NP  carvedilol (COREG) 25 MG tablet Take 1 tablet (25 mg total) by mouth 2 (two) times daily with a meal. 01/23/19 02/22/19  Croitoru, Mihai, MD  clopidogrel (PLAVIX) 75 MG tablet Take 1 tablet (75 mg total) by mouth daily. 01/23/19   Croitoru, Mihai, MD  furosemide (LASIX) 40 MG tablet Take 1 tablet (40 mg total) by mouth daily. 01/20/19 02/19/19  [name], PA  Insulin  Isophane & Regular Human (NOVOLIN 70/30 FLEXPEN) (70-30) 100 UNIT/ML PEN Inject 26 Units into the skin 2 (two) times daily. 01/13/19   Bloomfield, Carley D, DO  metFORMIN (GLUCOPHAGE) 1000 MG tablet Take 1 tablet (1,000 mg total) by mouth 2 (two) times daily with a meal. IM program. 01/13/19 02/12/19  [place]omfield, Nila Nephew D, DO  methocarbamol (ROBAXIN) 500 MG tablet Take 1 tablet (500 mg total) by mouth 4 (four) times daily as needed for muscle spasms. 01/24/19   Rory Percy, DO  minoxidil (LONITEN) 10 MG tablet Take 1 tablet (10 mg total) by mouth 2 (two) times daily. 12/13/18 01/22/19  Duke, Tami Lin, PA    potassium chloride (KLOR-CON) 20 MEQ packet Take 20 mEq by mouth daily. 01/20/19 02/19/19  DukeTami Lin, PA    Family History Family History  Problem Relation Age of Onset   Hypertension Mother    Diabetes Mother    Heart disease Mother    Hypertension Father     Social History Social History   Tobacco Use   Smoking status: Former Smoker    Years: 4.00    Types: Cigars    Quit date: 2010    Years since quitting: 10.9   Smokeless tobacco: Never Used  Substance Use Topics   Alcohol use: No   Drug use: No     Allergies   Zithromax [azithromycin dihydrate] and Kiwi extract   Review of Systems Review of Systems  Constitutional: Negative for fever.  HENT: Negative for congestion and rhinorrhea.   Respiratory: Negative for shortness of breath.   Cardiovascular: Negative for chest pain.  Gastrointestinal: Negative for abdominal pain, blood in stool, constipation, diarrhea, nausea, rectal pain and vomiting.  Genitourinary: Positive for dysuria and flank pain. Negative for decreased urine volume, difficulty urinating, hematuria, penile pain, testicular pain and urgency.     Physical Exam Updated Vital Signs BP (!) 165/124 (BP Location: Left Arm)    Pulse 80    Temp 98.8 F (37.1 C) (Oral)    Resp 18    Ht 5\' 9"  (1.753 m)    Wt 131.5 kg    SpO2 98%    BMI 42.83 kg/m   Physical Exam Constitutional:      General: He is not in acute distress.    Appearance: He is obese. He is not ill-appearing, toxic-appearing or diaphoretic.  HENT:     Head: Normocephalic.  Cardiovascular:     Rate and Rhythm: Normal rate and regular rhythm.     Heart sounds: Normal heart sounds. No murmur.  Pulmonary:     Breath sounds: Normal breath sounds. No wheezing or rales.  Abdominal:     General: Bowel sounds are normal.     Palpations: Abdomen is soft.     Tenderness: There is no right CVA tenderness or left CVA tenderness.     Comments: Suprapubic tenderness   Musculoskeletal:     Right lower leg: No edema.     Left lower leg: No edema.     Comments: Tenderness to deep palpation of R flank. No asymmetry or hypertonicity to paravertebral musculature.  Skin:    General: Skin is warm and dry.     Findings: No rash.  Neurological:     Mental Status: He is oriented to person, place, and time.     ED Treatments / Results  Labs (all labs ordered are listed, but only abnormal results are displayed) Labs Reviewed  URINALYSIS, ROUTINE W REFLEX MICROSCOPIC - Abnormal; Notable  for the following components:      Result Value   Protein, ur 100 (*)    Bacteria, UA RARE (*)    All other components within normal limits  BASIC METABOLIC PANEL - Abnormal; Notable for the following components:   Glucose, Bld 109 (*)    Creatinine, Ser 1.88 (*)    GFR calc non Af Amer 45 (*)    GFR calc Af Amer 52 (*)    All other components within normal limits  CBC WITH DIFFERENTIAL/PLATELET - Abnormal; Notable for the following components:   MCV 79.9 (*)    All other components within normal limits    EKG None  Radiology Mr Lumbar Spine W Wo Contrast (assess For Abscess, Cord Compression)  Result Date: 01/24/2019 CLINICAL DATA:  Back pain EXAM: MRI LUMBAR SPINE WITHOUT AND WITH CONTRAST TECHNIQUE: Multiplanar and multiecho pulse sequences of the lumbar spine were obtained without and with intravenous contrast. CONTRAST:  22mL GADAVIST GADOBUTROL 1 MMOL/ML IV SOLN COMPARISON:  None. FINDINGS: Segmentation:  Standard. Alignment:  Preserved. Vertebrae: Vertebral body heights are maintained. There is diffusely decreased T1 marrow signal. No marrow edema or abnormal enhancement. Conus medullaris and cauda equina: Conus extends to the superior L2 level. Conus and cauda equina appear normal. Minimal fatty filum terminale. Paraspinal and other soft tissues: Unremarkable Disc levels: Intervertebral disc heights and signal are maintained. No significant canal or neural  foraminal stenosis. IMPRESSION: No significant stenosis. Decreased T1 marrow signal. This may reflect hematopoietic marrow but a marrow infiltrating process is not excluded. Electronically Signed   By: Macy Mis M.D.   On: 01/24/2019 14:00   US Renal  Result Date: 01/24/2019 CLINICAL DATA:  Suprapubic and right flank pain for 4 days. EXAM: RENAL / URINARY TRACT ULTRASOUND COMPLETE COMPARISON:  CT renal stone study 01/22/2027 FINDINGS: Right Kidney: Renal measurements: 11.6 x 5.2 x 7.3 cm = volume: 231 mL. The parenchyma is diffusely echogenic, isoechoic to the index organ, the liver. No stone or mass lesion is present. There is no hydronephrosis. Left Kidney: Renal measurements: 11.8 x 5.6 x 5.8 cm = volume: 204 mL. The parenchyma is diffusely echogenic, isoechoic to the index organ, the spleen. No stone or mass lesion is present. There is no hydronephrosis. Bladder: Appears normal for degree of bladder distention. Other: Prostate is enlarged and somewhat irregular, measuring 4.2 x 5.2 x 3.4 cm IMPRESSION: 1. The kidneys are echogenic bilaterally. This is nonspecific, but can be seen in the setting of medical renal disease. 2. No stone or mass lesion.  No hydronephrosis. 3. Enlarged irregular prostate gland. Electronically Signed   By: San Morelle M.D.   On: 01/24/2019 10:04    Procedures Procedures (including critical care time)  Medications Ordered in ED Medications  diazepam (VALIUM) tablet 5 mg (5 mg Oral Given 01/24/19 1023)  oxyCODONE-acetaminophen (PERCOCET/ROXICET) 5-325 MG per tablet 1 tablet (1 tablet Oral Given 01/24/19 1023)  gadobutrol (GADAVIST) 1 MMOL/ML injection 10 mL (10 mLs Intravenous Contrast Given 01/24/19 1342)    Initial Impression / Assessment and Plan / ED Course  I have reviewed the triage vital signs and the nursing notes.  Pertinent labs & imaging results that were available during my care of the patient were reviewed by me and considered in my medical  decision making (see chart for details).   38 year old male with history of CKD3, diabetes, HTN, h/o stroke, obesity who presents for persistent lower intermittent R flank pain for the past 3-4 days, now with  suprapubic pain on exam without h/o injury or trauma. Seen previously 11/22 with negative workup including CT renal study, UA, BMP, CBC. Vitals notable only for higher BP. Exam notable for lower R flank tenderness to deep palpation, negative CVA tenderness, and with pain with movement, would suggest MSK etiology and spasm.  Suprapubic tenderness noted on exam, obtained UA and bladder ultrasound which showed irregular and enlarged prostate which could be contributing though without obstruction or hydronephrosis. Creatinine better than baseline. UA negative for hemoglobin or infection.  Valium and percocet given with subsequent pain relief on reevaluation.  Given ongoing history of incontinence, MRI lumbar spine obtained but did not show evidence of neural compression or cauda equina syndrome. Symptoms likely 2/2 muscular strain and possible spasticity from recent stroke. Recommended follow up with PCP and possibly urology for enlarged prostate. Discharged with prn robaxin.     Final Clinical Impressions(s) / ED Diagnoses   Final diagnoses:  Muscle strain    ED Discharge Orders         Ordered    methocarbamol (ROBAXIN) 500 MG tablet  4 times daily PRN     01/24/19 1417           Rory Percy, DO 01/24/19 1429    Isla Pence, MD 01/24/19 1436

## 2019-01-24 NOTE — ED Notes (Signed)
Patient transported to Ultrasound 

## 2019-01-24 NOTE — ED Triage Notes (Signed)
Pt c/o low back pain x 2-3 days, no known injury. No OTC taken PTA.

## 2019-01-24 NOTE — ED Notes (Signed)
Pt d/c home per MD order. Discharge summary reviewed with pt, pt verbalizes understanding. Reports wife is here for discharge ride home. Ambulatory off unit.

## 2019-01-24 NOTE — Discharge Instructions (Addendum)
You were seen today for R sided low back pain which is thought to be due to muscular spasm. We gave you medication for pain and to relax the muscle with relief in your pain. Your ultrasound and labs did not show evidence of kidney infection or new disease. Your prostate was slightly enlarged and irregular, we recommend you follow up with your primary doctor or a urologist for further workup. Your MRI did not show evidence of nerve impingement. Take the muscle relaxer as needed for back pain. Follow up with your primary doctor next week for symptom check.

## 2019-01-25 ENCOUNTER — Other Ambulatory Visit: Payer: Self-pay | Admitting: Physician Assistant

## 2019-01-25 DIAGNOSIS — N1831 Chronic kidney disease, stage 3a: Secondary | ICD-10-CM

## 2019-01-30 ENCOUNTER — Other Ambulatory Visit (HOSPITAL_COMMUNITY)
Admission: RE | Admit: 2019-01-30 | Discharge: 2019-01-30 | Disposition: A | Discharge status: Home / Self Care | Payer: Medicaid Other | Source: Ambulatory Visit | Attending: Cardiology | Admitting: Cardiology

## 2019-01-30 DIAGNOSIS — Z01812 Encounter for preprocedural laboratory examination: Secondary | ICD-10-CM | POA: Insufficient documentation

## 2019-01-30 DIAGNOSIS — Z20828 Contact with and (suspected) exposure to other viral communicable diseases: Secondary | ICD-10-CM | POA: Diagnosis not present

## 2019-01-31 ENCOUNTER — Telehealth (INDEPENDENT_AMBULATORY_CARE_PROVIDER_SITE_OTHER): Payer: Medicaid Other | Admitting: Physician Assistant

## 2019-01-31 VITALS — BP 123/90 | HR 85 | Ht 69.0 in | Wt 263.0 lb

## 2019-01-31 DIAGNOSIS — I639 Cerebral infarction, unspecified: Secondary | ICD-10-CM

## 2019-01-31 DIAGNOSIS — I110 Hypertensive heart disease with heart failure: Secondary | ICD-10-CM | POA: Diagnosis not present

## 2019-01-31 DIAGNOSIS — I5042 Chronic combined systolic (congestive) and diastolic (congestive) heart failure: Secondary | ICD-10-CM | POA: Diagnosis not present

## 2019-01-31 DIAGNOSIS — R400 Somnolence: Secondary | ICD-10-CM

## 2019-01-31 DIAGNOSIS — N182 Chronic kidney disease, stage 2 (mild): Secondary | ICD-10-CM

## 2019-01-31 DIAGNOSIS — R0683 Snoring: Secondary | ICD-10-CM | POA: Diagnosis not present

## 2019-01-31 LAB — NOVEL CORONAVIRUS, NAA (HOSP ORDER, SEND-OUT TO REF LAB; TAT 18-24 HRS): SARS-CoV-2, NAA: NOT DETECTED

## 2019-01-31 NOTE — Patient Instructions (Addendum)
Special Instructions: CALL DR SETHI'S OFFICE TO SCHEDULE AN OFFICE VISIT  Follow-Up: IN JANUARY  Either In Person or Virtual OFFICE APPT IS PREFERRED. You may see Sanda Klein, MD or one of the following Advanced Practice Providers on your designated Care Team: Fabian Sharp, PA-C Almyra Deforest, PA-C or [place], [place].    At [facility], you and your health needs are our priority.  As part of our continuing mission to provide you with exceptional heart care, we have created designated Provider Care Teams.  These Care Teams include your primary Cardiologist (physician) and Advanced Practice Providers (APPs -  Physician Assistants and Nurse Practitioners) who all work together to provide you with the care you need, when you need it.  Thank you for choosing [facility] at [facility]!!          Happy Holidays!!

## 2019-01-31 NOTE — Progress Notes (Signed)
Virtual Visit via Telephone Note   This visit type was conducted due to national recommendations for restrictions regarding the COVID-19 Pandemic (e.g. social distancing) in an effort to limit this patient's exposure and mitigate transmission in our community.  Due to his co-morbid illnesses, this patient is at least at moderate risk for complications without adequate follow up.  This format is felt to be most appropriate for this patient at this time.  The patient did not have access to video technology/had technical difficulties with video requiring transitioning to audio format only (telephone).  All issues noted in this document were discussed and addressed.  No physical exam could be performed with this format.  Please refer to the patient's chart for his  consent to telehealth for [facility].   Date:  01/31/2019   ID:  Harold Clements, DOB [birth year], MRN [id number]  Patient Location: Home Provider Location: Home  PCP:  Kerin Perna, NP  Cardiologist:  Sanda Klein, MD  Electrophysiologist:  None   Evaluation Performed:  Follow-Up Visit  Chief Complaint:  HTN  History of Present Illness:    Harold Clements is a 38 y.o. male with a hx of multiple hospitalizations for hypertensive urgency, chronic diastolic heart failure, DM type II, CKD stage III, OSA intermittently compliant with CPAP, morbid obesity, and medication noncompliance.  He was seen on 11/28/2018 by Dr. Sallyanne Kuster for a new patient evaluation.  He had presented to the ER on 9/27 with shortness of breath.  History of renal artery ultrasound in 2018 that was negative for renal artery stenosis.  On arrival his BP was noted to be 255/180 mmHg.  He does have a history of noncompliance and renal insufficiency which complicates his management.  Echo during that admission showed an EF of 45 to 50%, moderate concentric LVH, elevated mean LA pressures, grade 2 diastolic dysfunction, moderate biatrial enlargement, trivial  circumferential antral pericardial effusion, and no significant valvular abnormalities.  Baseline serum creatinine thought to be 1.7.  Medications were titrated as followed: Beta-blocker transition to carvedilol, hydralazine was stopped, started on minoxidil, continued amlodipine, and clonidine was discontinued for medication noncompliance and risk of rebound hypertension.  His 40 mg lasix daily was resumed.  Unable to add an ace inhibitor, ARB, spironolactone due to renal insufficiency.  Given the above, he was still quite hypertensive. I increased minoxidil to 10 mg BID and increased coreg to 25 mg BID. He had instructions to return with a BP log. In consultation with Dr. Sallyanne Kuster, consider adding spiro and stop K instead of adding hydralazine.  He missed his follow up appt with me on 12/29/18. He unfortunately presented to [facility] 01/11/19 with stroke-like symptoms including left-sided weakness and confusion. CT head with right basal ganglia hypodensities suspicious for acute infarct, no hemorrhage.  Echo 01/11/19 with further decrease in EF to 40-45%, grade 1 DD, normal left and right atrial size. Carotid duplex 01/12/19 with minimal plaque bilaterally. MRI brain with acute infarctions involving the right basal ganglia and small chronic infarct of the left thalamus. Permissive HTN for 2 days following his stroke. Amlodipine and coreg were resumed at discharge with improvement in his BP. He was discharged on ASA and plavix as well as lipitor 80 mg. Neurology recommended OP TEE to rule out cardiac source of embolic stroke in the setting of reduced EF.   Unfortunately, he has presented to the ER twice in the interim with right sided flank pain. Imaging revealed enlarged prostate, but no signs of  infection, normal CT. Wife reported urinary incontinence and MRI lumbar was obtained, which was negative for cauda equina. sCr was better than baseline and he was discharged on tylenol for presumed MSK etiology.     He  presents today for follow up. I was able to connect with him and his wife via telephone. His flank pain has resolved.  BP log reveals 141/93 today.  123/90, 85 123/88 135/90 124/91 123/89 135/90 132/91 130/79 134/84 123/90  He is feeling much better with this pressure. He is also more talkative and states he is recovering from his stroke. He is in better spirits than our last meeting.   The patient does not have symptoms concerning for COVID-19 infection (fever, chills, cough, or new shortness of breath).    Past Medical History:  Diagnosis Date   CKD (chronic kidney disease) stage 2, GFR 60-89 ml/min    Diabetes mellitus without complication ([place])    Ear pain 07/2017   Hypertension    Wasp sting 08/04/2016   of dorsum of left hand near thumb   No past surgical history on file.   Current Meds  Medication Sig   amLODipine (NORVASC) 10 MG tablet Take 1 tablet (10 mg total) by mouth daily.   atorvastatin (LIPITOR) 80 MG tablet Take 1 tablet (80 mg total) by mouth daily at 6 PM.   canagliflozin (INVOKANA) 100 MG TABS tablet Take 1 tablet (100 mg total) by mouth daily before breakfast.   carvedilol (COREG) 25 MG tablet Take 1 tablet (25 mg total) by mouth 2 (two) times daily with a meal.   clopidogrel (PLAVIX) 75 MG tablet Take 1 tablet (75 mg total) by mouth daily.   furosemide (LASIX) 40 MG tablet Take 1 tablet (40 mg total) by mouth daily.   Insulin Isophane & Regular Human (NOVOLIN 70/30 FLEXPEN) (70-30) 100 UNIT/ML PEN Inject 26 Units into the skin 2 (two) times daily.   metFORMIN (GLUCOPHAGE) 1000 MG tablet Take 1 tablet (1,000 mg total) by mouth 2 (two) times daily with a meal. IM program.   methocarbamol (ROBAXIN) 500 MG tablet Take 1 tablet (500 mg total) by mouth 4 (four) times daily as needed for muscle spasms.   minoxidil (LONITEN) 10 MG tablet Take 1 tablet (10 mg total) by mouth 2 (two) times daily.   potassium chloride (KLOR-CON) 20 MEQ packet Take  20 mEq by mouth daily.     Allergies:   Zithromax [azithromycin dihydrate] and Kiwi extract   Social History   Tobacco Use   Smoking status: Former Smoker    Years: 4.00    Types: Cigars    Quit date: 2010    Years since quitting: 10.9   Smokeless tobacco: Never Used  Substance Use Topics   Alcohol use: No   Drug use: No     Family Hx: The patient's family history includes Diabetes in his mother; Heart disease in his mother; Hypertension in his father and mother.  ROS:   Please see the history of present illness.     All other systems reviewed and are negative.   Prior CV studies:   The following studies were reviewed today:  Echo 01/11/19:  1. Left ventricular ejection fraction, by visual estimation, is 40 to 45%. The left ventricle has mild to moderately decreased function. Left ventricular septal wall thickness was mildly increased. Mildly increased left ventricular posterior wall  thickness. There is mildly increased left ventricular hypertrophy.  2. Left ventricular diastolic parameters are consistent with Grade  I diastolic dysfunction (impaired relaxation).  3. The left ventricle demonstrates global hypokinesis.  4. Global right ventricle has normal systolic function.The right ventricular size is normal. No increase in right ventricular wall thickness.  5. Left atrial size was normal.  6. Right atrial size was normal.  7. The mitral valve is normal in structure. Trace mitral valve regurgitation. No evidence of mitral stenosis.  8. The tricuspid valve is normal in structure. Tricuspid valve regurgitation is not demonstrated.  9. The aortic valve is normal in structure. Aortic valve regurgitation is not visualized. No evidence of aortic valve sclerosis or stenosis. 10. The pulmonic valve was normal in structure. Pulmonic valve regurgitation is not visualized. 11. The inferior vena cava is normal in size with greater than 50% respiratory variability, suggesting right  atrial pressure of 3 mmHg.  Labs/Other Tests and Data Reviewed:    EKG:  No ECG reviewed.  Recent Labs: 11/27/2018: B Natriuretic Peptide 402.3 01/11/2019: TSH 2.986 01/22/2019: ALT 12 01/24/2019: BUN 17; Creatinine, Ser 1.88; Hemoglobin 14.2; Platelets 271; Potassium 3.6; Sodium 139   Recent Lipid Panel Lab Results  Component Value Date/Time   CHOL 134 01/12/2019 03:39 AM   TRIG 118 01/12/2019 03:39 AM   HDL 32 (L) 01/12/2019 03:39 AM   CHOLHDL 4.2 01/12/2019 03:39 AM   LDLCALC 78 01/12/2019 03:39 AM    Wt Readings from Last 3 Encounters:  01/31/19 263 lb (119.3 kg)  01/24/19 290 lb (131.5 kg)  01/20/19 256 lb (116.1 kg)     Objective:    Vital Signs:  BP 123/90    Pulse 85    Ht 5\' 9"  (1.753 m)    Wt 263 lb (119.3 kg)    BMI 38.84 kg/m    VITAL SIGNS:  reviewed GEN:  no acute distress RESPIRATORY:  respirations unlabored NEURO:  alert and oriented x 3, no obvious focal deficit PSYCH:  normal affect  ASSESSMENT & PLAN:    Hypertension - coreg 25 mg, amlodipine 10 mg, minoxidil 10 mg BID  - pressure log reveals generally better-controlled pressure - we will continue present medications - he is still recovering from his stroke - he will continue with BP log - follow up in one month - if more pressure control needed, would add spironolactone   Chronic systolic and diastolic heart failure EF now further reduced to 40-45% - 40 mg lasix with 20 mEq potassium - encouraged low salt diet, daily weights, 2L fluid restriction - sounds euvolemic today   CVA 01/2019 - residual upper arm weakness is improving - continue plavix - scheduled for TEE to rule out cardioembolic source - if negative, may need loop recorder  - last EKG with sinus rhythm - they states they do not have a neurology follow up apt, I gave then the office number for Dr. Leonie Man   CKD stage III - recent sCr was 1.88, K 3.6 - continue diuretic   OSA Referred for sleep study at last visit. He  used a CPAP during his last hospitalization.  - untreated OSA is likely contributing to his difficult to treat hypertension   Hyperlipidemia with LDL goal < 70 - lipitor 80 mg 01/12/2019: Cholesterol 134; HDL 32; LDL Cholesterol 78; Triglycerides 118; VLDL 24   Diabetes - A1c is 8.8% - on metformin and ?invokana   COVID-19 Education: The signs and symptoms of COVID-19 were discussed with the patient and how to seek care for testing (follow up with PCP or arrange E-visit).  The importance  of social distancing was discussed today.  Time:   Today, I have spent 35 minutes with the patient with telehealth technology discussing the above problems.     Medication Adjustments/Labs and Tests Ordered: Current medicines are reviewed at length with the patient today.  Concerns regarding medicines are outlined above.   Tests Ordered: No orders of the defined types were placed in this encounter.   Medication Changes: No orders of the defined types were placed in this encounter.   Follow Up:  Either In Person or Virtual in 1 month(s)  Signed, [name], PA  01/31/2019 4:06 PM    [facility]

## 2019-01-31 NOTE — H&P (View-Only) (Signed)
Virtual Visit via Telephone Note   This visit type was conducted due to national recommendations for restrictions regarding the COVID-19 Pandemic (e.g. social distancing) in an effort to limit this patient's exposure and mitigate transmission in our community.  Due to his co-morbid illnesses, this patient is at least at moderate risk for complications without adequate follow up.  This format is felt to be most appropriate for this patient at this time.  The patient did not have access to video technology/had technical difficulties with video requiring transitioning to audio format only (telephone).  All issues noted in this document were discussed and addressed.  No physical exam could be performed with this format.  Please refer to the patient's chart for his  consent to telehealth for [facility].   Date:  01/31/2019   ID:  Harold Clements, DOB 14-Feb-1981, MRN [id number]  Patient Location: Home Provider Location: Home  PCP:  Kerin Perna, NP  Cardiologist:  Sanda Klein, MD  Electrophysiologist:  None   Evaluation Performed:  Follow-Up Visit  Chief Complaint:  HTN  History of Present Illness:    Harold Clements is a 38 y.o. male with a hx of multiple hospitalizations for hypertensive urgency, chronic diastolic heart failure, DM type II, CKD stage III, OSA intermittently compliant with CPAP, morbid obesity, and medication noncompliance.  He was seen on 11/28/2018 by Dr. Sallyanne Kuster for a new patient evaluation.  He had presented to the ER on 9/27 with shortness of breath.  History of renal artery ultrasound in 2018 that was negative for renal artery stenosis.  On arrival his BP was noted to be 255/180 mmHg.  He does have a history of noncompliance and renal insufficiency which complicates his management.  Echo during that admission showed an EF of 45 to 50%, moderate concentric LVH, elevated mean LA pressures, grade 2 diastolic dysfunction, moderate biatrial enlargement, trivial  circumferential antral pericardial effusion, and no significant valvular abnormalities.  Baseline serum creatinine thought to be 1.7.  Medications were titrated as followed: Beta-blocker transition to carvedilol, hydralazine was stopped, started on minoxidil, continued amlodipine, and clonidine was discontinued for medication noncompliance and risk of rebound hypertension.  His 40 mg lasix daily was resumed.  Unable to add an ace inhibitor, ARB, spironolactone due to renal insufficiency.  Given the above, he was still quite hypertensive. I increased minoxidil to 10 mg BID and increased coreg to 25 mg BID. He had instructions to return with a BP log. In consultation with Dr. Sallyanne Kuster, consider adding spiro and stop K instead of adding hydralazine.  He missed his follow up appt with me on 12/29/18. He unfortunately presented to [facility] 01/11/19 with stroke-like symptoms including left-sided weakness and confusion. CT head with right basal ganglia hypodensities suspicious for acute infarct, no hemorrhage.  Echo 01/11/19 with further decrease in EF to 40-45%, grade 1 DD, normal left and right atrial size. Carotid duplex 01/12/19 with minimal plaque bilaterally. MRI brain with acute infarctions involving the right basal ganglia and small chronic infarct of the left thalamus. Permissive HTN for 2 days following his stroke. Amlodipine and coreg were resumed at discharge with improvement in his BP. He was discharged on ASA and plavix as well as lipitor 80 mg. Neurology recommended OP TEE to rule out cardiac source of embolic stroke in the setting of reduced EF.   Unfortunately, he has presented to the ER twice in the interim with right sided flank pain. Imaging revealed enlarged prostate, but no signs of  infection, normal CT. Wife reported urinary incontinence and MRI lumbar was obtained, which was negative for cauda equina. sCr was better than baseline and he was discharged on tylenol for presumed MSK etiology.     He  presents today for follow up. I was able to connect with him and his wife via telephone. His flank pain has resolved.  BP log reveals 141/93 today.  123/90, 85 123/88 135/90 124/91 123/89 135/90 132/91 130/79 134/84 123/90  He is feeling much better with this pressure. He is also more talkative and states he is recovering from his stroke. He is in better spirits than our last meeting.   The patient does not have symptoms concerning for COVID-19 infection (fever, chills, cough, or new shortness of breath).    Past Medical History:  Diagnosis Date   CKD (chronic kidney disease) stage 2, GFR 60-89 ml/min    Diabetes mellitus without complication ([place])    Ear pain 07/2017   Hypertension    Wasp sting 08/04/2016   of dorsum of left hand near thumb   No past surgical history on file.   Current Meds  Medication Sig   amLODipine (NORVASC) 10 MG tablet Take 1 tablet (10 mg total) by mouth daily.   atorvastatin (LIPITOR) 80 MG tablet Take 1 tablet (80 mg total) by mouth daily at 6 PM.   canagliflozin (INVOKANA) 100 MG TABS tablet Take 1 tablet (100 mg total) by mouth daily before breakfast.   carvedilol (COREG) 25 MG tablet Take 1 tablet (25 mg total) by mouth 2 (two) times daily with a meal.   clopidogrel (PLAVIX) 75 MG tablet Take 1 tablet (75 mg total) by mouth daily.   furosemide (LASIX) 40 MG tablet Take 1 tablet (40 mg total) by mouth daily.   Insulin Isophane & Regular Human (NOVOLIN 70/30 FLEXPEN) (70-30) 100 UNIT/ML PEN Inject 26 Units into the skin 2 (two) times daily.   metFORMIN (GLUCOPHAGE) 1000 MG tablet Take 1 tablet (1,000 mg total) by mouth 2 (two) times daily with a meal. IM program.   methocarbamol (ROBAXIN) 500 MG tablet Take 1 tablet (500 mg total) by mouth 4 (four) times daily as needed for muscle spasms.   minoxidil (LONITEN) 10 MG tablet Take 1 tablet (10 mg total) by mouth 2 (two) times daily.   potassium chloride (KLOR-CON) 20 MEQ packet Take  20 mEq by mouth daily.     Allergies:   Zithromax [azithromycin dihydrate] and Kiwi extract   Social History   Tobacco Use   Smoking status: Former Smoker    Years: 4.00    Types: Cigars    Quit date: 2010    Years since quitting: 10.9   Smokeless tobacco: Never Used  Substance Use Topics   Alcohol use: No   Drug use: No     Family Hx: The patient's family history includes Diabetes in his mother; Heart disease in his mother; Hypertension in his father and mother.  ROS:   Please see the history of present illness.     All other systems reviewed and are negative.   Prior CV studies:   The following studies were reviewed today:  Echo 01/11/19:  1. Left ventricular ejection fraction, by visual estimation, is 40 to 45%. The left ventricle has mild to moderately decreased function. Left ventricular septal wall thickness was mildly increased. Mildly increased left ventricular posterior wall  thickness. There is mildly increased left ventricular hypertrophy.  2. Left ventricular diastolic parameters are consistent with Grade  I diastolic dysfunction (impaired relaxation).  3. The left ventricle demonstrates global hypokinesis.  4. Global right ventricle has normal systolic function.The right ventricular size is normal. No increase in right ventricular wall thickness.  5. Left atrial size was normal.  6. Right atrial size was normal.  7. The mitral valve is normal in structure. Trace mitral valve regurgitation. No evidence of mitral stenosis.  8. The tricuspid valve is normal in structure. Tricuspid valve regurgitation is not demonstrated.  9. The aortic valve is normal in structure. Aortic valve regurgitation is not visualized. No evidence of aortic valve sclerosis or stenosis. 10. The pulmonic valve was normal in structure. Pulmonic valve regurgitation is not visualized. 11. The inferior vena cava is normal in size with greater than 50% respiratory variability, suggesting right  atrial pressure of 3 mmHg.  Labs/Other Tests and Data Reviewed:    EKG:  No ECG reviewed.  Recent Labs: 11/27/2018: B Natriuretic Peptide 402.3 01/11/2019: TSH 2.986 01/22/2019: ALT 12 01/24/2019: BUN 17; Creatinine, Ser 1.88; Hemoglobin 14.2; Platelets 271; Potassium 3.6; Sodium 139   Recent Lipid Panel Lab Results  Component Value Date/Time   CHOL 134 01/12/2019 03:39 AM   TRIG 118 01/12/2019 03:39 AM   HDL 32 (L) 01/12/2019 03:39 AM   CHOLHDL 4.2 01/12/2019 03:39 AM   LDLCALC 78 01/12/2019 03:39 AM    Wt Readings from Last 3 Encounters:  01/31/19 263 lb (119.3 kg)  01/24/19 290 lb (131.5 kg)  01/20/19 256 lb (116.1 kg)     Objective:    Vital Signs:  BP 123/90    Pulse 85    Ht 5\' 9"  (1.753 m)    Wt 263 lb (119.3 kg)    BMI 38.84 kg/m    VITAL SIGNS:  reviewed GEN:  no acute distress RESPIRATORY:  respirations unlabored NEURO:  alert and oriented x 3, no obvious focal deficit PSYCH:  normal affect  ASSESSMENT & PLAN:    Hypertension - coreg 25 mg, amlodipine 10 mg, minoxidil 10 mg BID  - pressure log reveals generally better-controlled pressure - we will continue present medications - he is still recovering from his stroke - he will continue with BP log - follow up in one month - if more pressure control needed, would add spironolactone   Chronic systolic and diastolic heart failure EF now further reduced to 40-45% - 40 mg lasix with 20 mEq potassium - encouraged low salt diet, daily weights, 2L fluid restriction - sounds euvolemic today   CVA 01/2019 - residual upper arm weakness is improving - continue plavix - scheduled for TEE to rule out cardioembolic source - if negative, may need loop recorder  - last EKG with sinus rhythm - they states they do not have a neurology follow up apt, I gave then the office number for Dr. Leonie Man   CKD stage III - recent sCr was 1.88, K 3.6 - continue diuretic   OSA Referred for sleep study at last visit. He  used a CPAP during his last hospitalization.  - untreated OSA is likely contributing to his difficult to treat hypertension   Hyperlipidemia with LDL goal < 70 - lipitor 80 mg 01/12/2019: Cholesterol 134; HDL 32; LDL Cholesterol 78; Triglycerides 118; VLDL 24   Diabetes - A1c is 8.8% - on metformin and ?invokana   COVID-19 Education: The signs and symptoms of COVID-19 were discussed with the patient and how to seek care for testing (follow up with PCP or arrange E-visit).  The importance  of social distancing was discussed today.  Time:   Today, I have spent 35 minutes with the patient with telehealth technology discussing the above problems.     Medication Adjustments/Labs and Tests Ordered: Current medicines are reviewed at length with the patient today.  Concerns regarding medicines are outlined above.   Tests Ordered: No orders of the defined types were placed in this encounter.   Medication Changes: No orders of the defined types were placed in this encounter.   Follow Up:  Either In Person or Virtual in 1 month(s)  Signed, [name], PA  01/31/2019 4:06 PM    [facility]

## 2019-02-02 ENCOUNTER — Ambulatory Visit (HOSPITAL_COMMUNITY)
Admission: RE | Admit: 2019-02-02 | Discharge: 2019-02-02 | Disposition: A | Discharge status: Home / Self Care | Payer: Medicaid Other | Attending: Cardiology | Admitting: Cardiology

## 2019-02-02 ENCOUNTER — Ambulatory Visit (HOSPITAL_BASED_OUTPATIENT_CLINIC_OR_DEPARTMENT_OTHER)
Admission: RE | Admit: 2019-02-02 | Discharge: 2019-02-02 | Disposition: A | Discharge status: Home / Self Care | Payer: Medicaid Other | Source: Ambulatory Visit | Attending: Physician Assistant | Admitting: Physician Assistant

## 2019-02-02 ENCOUNTER — Encounter (HOSPITAL_COMMUNITY): Payer: Self-pay | Admitting: *Deleted

## 2019-02-02 ENCOUNTER — Other Ambulatory Visit: Payer: Self-pay

## 2019-02-02 ENCOUNTER — Encounter (HOSPITAL_COMMUNITY)
Admission: RE | Disposition: A | Discharge status: Home / Self Care | Payer: Self-pay | Source: Home / Self Care | Attending: Cardiology

## 2019-02-02 DIAGNOSIS — Z7902 Long term (current) use of antithrombotics/antiplatelets: Secondary | ICD-10-CM | POA: Diagnosis not present

## 2019-02-02 DIAGNOSIS — I5042 Chronic combined systolic (congestive) and diastolic (congestive) heart failure: Secondary | ICD-10-CM | POA: Insufficient documentation

## 2019-02-02 DIAGNOSIS — Z87891 Personal history of nicotine dependence: Secondary | ICD-10-CM | POA: Insufficient documentation

## 2019-02-02 DIAGNOSIS — E785 Hyperlipidemia, unspecified: Secondary | ICD-10-CM | POA: Diagnosis not present

## 2019-02-02 DIAGNOSIS — Z794 Long term (current) use of insulin: Secondary | ICD-10-CM | POA: Diagnosis not present

## 2019-02-02 DIAGNOSIS — E1122 Type 2 diabetes mellitus with diabetic chronic kidney disease: Secondary | ICD-10-CM | POA: Insufficient documentation

## 2019-02-02 DIAGNOSIS — N183 Chronic kidney disease, stage 3 unspecified: Secondary | ICD-10-CM | POA: Insufficient documentation

## 2019-02-02 DIAGNOSIS — Z9114 Patient's other noncompliance with medication regimen: Secondary | ICD-10-CM | POA: Diagnosis not present

## 2019-02-02 DIAGNOSIS — Z79899 Other long term (current) drug therapy: Secondary | ICD-10-CM | POA: Insufficient documentation

## 2019-02-02 DIAGNOSIS — I313 Pericardial effusion (noninflammatory): Secondary | ICD-10-CM

## 2019-02-02 DIAGNOSIS — G4733 Obstructive sleep apnea (adult) (pediatric): Secondary | ICD-10-CM | POA: Insufficient documentation

## 2019-02-02 DIAGNOSIS — I639 Cerebral infarction, unspecified: Secondary | ICD-10-CM

## 2019-02-02 DIAGNOSIS — I130 Hypertensive heart and chronic kidney disease with heart failure and stage 1 through stage 4 chronic kidney disease, or unspecified chronic kidney disease: Secondary | ICD-10-CM | POA: Diagnosis not present

## 2019-02-02 DIAGNOSIS — Z881 Allergy status to other antibiotic agents status: Secondary | ICD-10-CM | POA: Insufficient documentation

## 2019-02-02 DIAGNOSIS — Z6838 Body mass index (BMI) 38.0-38.9, adult: Secondary | ICD-10-CM | POA: Diagnosis not present

## 2019-02-02 HISTORY — PX: BUBBLE STUDY: SHX6837

## 2019-02-02 HISTORY — PX: TEE WITHOUT CARDIOVERSION: SHX5443

## 2019-02-02 LAB — GLUCOSE, CAPILLARY: Glucose-Capillary: 93 mg/dL (ref 70–99)

## 2019-02-02 SURGERY — ECHOCARDIOGRAM, TRANSESOPHAGEAL
Anesthesia: Moderate Sedation

## 2019-02-02 MED ORDER — DIPHENHYDRAMINE HCL 50 MG/ML IJ SOLN
INTRAMUSCULAR | Status: AC
  Filled 2019-02-02: qty 1

## 2019-02-02 MED ORDER — FENTANYL CITRATE (PF) 100 MCG/2ML IJ SOLN
INTRAMUSCULAR | Status: AC
  Filled 2019-02-02: qty 2

## 2019-02-02 MED ORDER — MIDAZOLAM HCL (PF) 5 MG/ML IJ SOLN
INTRAMUSCULAR | Status: AC
  Filled 2019-02-02: qty 2

## 2019-02-02 MED ORDER — SODIUM CHLORIDE 0.9 % IV SOLN: INTRAVENOUS | Status: DC

## 2019-02-02 MED ORDER — FENTANYL CITRATE (PF) 100 MCG/2ML IJ SOLN
INTRAMUSCULAR | Status: DC | PRN
  Administered 2019-02-02 (×2): 25 ug via INTRAVENOUS

## 2019-02-02 MED ORDER — BUTAMBEN-TETRACAINE-BENZOCAINE 2-2-14 % EX AERO
INHALATION_SPRAY | CUTANEOUS | Status: DC | PRN
  Administered 2019-02-02: 2 via TOPICAL

## 2019-02-02 MED ORDER — SODIUM CHLORIDE 0.9 % IV SOLN
INTRAVENOUS | Status: DC
  Administered 2019-02-02: 10:00:00 via INTRAVENOUS

## 2019-02-02 MED ORDER — MIDAZOLAM HCL (PF) 10 MG/2ML IJ SOLN
INTRAMUSCULAR | Status: DC | PRN
  Administered 2019-02-02: 1 mg via INTRAVENOUS
  Administered 2019-02-02: 2 mg via INTRAVENOUS
  Administered 2019-02-02: 1 mg via INTRAVENOUS
  Administered 2019-02-02: 2 mg via INTRAVENOUS

## 2019-02-02 NOTE — Progress Notes (Signed)
Echocardiogram Echocardiogram Transesophageal has been performed.  Harold Clements 02/02/2019, 10:27 AM

## 2019-02-02 NOTE — Discharge Instructions (Signed)

## 2019-02-02 NOTE — Procedures (Signed)
    Transesophageal Echocardiogram Note  URIAS CAIN [id number] 1980-11-12  Procedure: Transesophageal Echocardiogram Indications: CVA  Procedure Details Consent: Obtained Time Out: Verified patient identification, verified procedure, site/side was marked, verified correct patient position, special equipment/implants available, Radiology Safety Procedures followed,  medications/allergies/relevent history reviewed, required imaging and test results available.  Performed  Medications:  During this procedure the patient is administered a total of Versed 6 mg and Fentanyl 50 mcg  to achieve and maintain moderate conscious sedation.  The patient's heart rate, blood pressure, and oxygen saturation are monitored continuously during the procedure. The period of conscious sedation is 30 minutes, of which I was present face-to-face 100% of this time.  Mild global reduction in LV systolic function; LVH; no LAA thrombus; trace AI, MR and TR; negative saline microcavitation study.   Complications: No apparent complications Patient did tolerate procedure well.  Kirk Ruths, MD

## 2019-02-02 NOTE — H&P (Signed)
Telemedicine  01/31/2019  [facility], Harold Clements, [place] [facility] Hypertensive heart disease with congestive heart failure, unspecified heart failure type ([place]) +5 more Dx Follow-up ; Referred by Harold Perna, NP Reason for Visit     Additional Documentation Vitals:  BP 123/90  Pulse 85  Ht 5\' 9"  (1.753 m)  Wt 119.3 kg  BMI 38.84 kg/m  BSA 2.41 m  More Vitals   Flowsheets:  NEWS,  MEWS Score,  Anthropometrics    Encounter Info:  Billing Info,  History,  Allergies,  Detailed Report          All Notes Progress Notes by Harold Clements, [place] at 01/31/2019 1:30 PM Author: [name], PA Author Type: Physician Assistant Filed: 01/31/2019 4:42 PM  Note Status: Signed Cosign: Cosign Not Required Encounter Date: 01/31/2019  Editor: Harold Clements, [place] (Physician Assistant)   Expand All Collapse All     Virtual Visit via Telephone Note  This visit type was conducted due to national recommendations for restrictions regarding the COVID-19 Pandemic (e.g. social distancing) in an effort to limit this patient's exposure and mitigate transmission in our community. Due to his co-morbid illnesses, this patient is at least at moderate risk for complications without adequate follow up. This format is felt to be most appropriate for this patient at this time. The patient did not have access to video technology/had technical difficulties with video requiring transitioning to audio format only (telephone). All issues noted in this document were discussed and addressed. No physical exam could be performed with this format. Please refer to the patient's chart for his consent to telehealth for [facility].  Date: 01/31/2019  ID: Harold Clements, DOB 01-Mar-1981, MRN [id number]  Patient Location: Home  Provider Location: Home  PCP: Harold Perna, NP  Cardiologist: Harold Klein, MD  Electrophysiologist: None  Evaluation Performed: Follow-Up Visit    Chief Complaint: HTN   History of Present Illness:   Harold Clements is a 38 y.o. male with a hx of multiple hospitalizations for hypertensive urgency, chronic diastolic heart failure, DM type II, CKD stage III, OSA intermittently compliant with CPAP, morbid obesity, and medication noncompliance. He was seen on 11/28/2018 by Dr. Sallyanne Clements for a new patient evaluation. He had presented to the ER on 9/27 with shortness of breath. History of renal artery ultrasound in 2018 that was negative for renal artery stenosis. On arrival his BP was noted to be 255/180 mmHg. He does have a history of noncompliance and renal insufficiency which complicates his management. Echo during that admission showed an EF of 45 to 50%, moderate concentric LVH, elevated mean LA pressures, grade 2 diastolic dysfunction, moderate biatrial enlargement, trivial circumferential antral pericardial effusion, and no significant valvular abnormalities. Baseline serum creatinine thought to be 1.7. Medications were titrated as followed: Beta-blocker transition to carvedilol, hydralazine was stopped, started on minoxidil, continued amlodipine, and clonidine was discontinued for medication noncompliance and risk of rebound hypertension. His 40 mg lasix daily was resumed. Unable to add an ace inhibitor, ARB, spironolactone due to renal insufficiency. Given the above, he was still quite hypertensive. I increased minoxidil to 10 mg BID and increased coreg to 25 mg BID. He had instructions to return with a BP log. In consultation with Dr. Sallyanne Clements, consider adding spiro and stop K instead of adding hydralazine.  He missed his follow up appt with me on 12/29/18. He unfortunately presented to [facility] 01/11/19 with stroke-like symptoms including left-sided weakness and  confusion. CT head with right basal ganglia hypodensities suspicious for acute infarct, no hemorrhage. Echo 01/11/19 with further decrease in EF to 40-45%, grade 1 DD, normal left and right atrial  size. Carotid duplex 01/12/19 with minimal plaque bilaterally. MRI brain with acute infarctions involving the right basal ganglia and small chronic infarct of the left thalamus. Permissive HTN for 2 days following his stroke. Amlodipine and coreg were resumed at discharge with improvement in his BP. He was discharged on ASA and plavix as well as lipitor 80 mg. Neurology recommended OP TEE to rule out cardiac source of embolic stroke in the setting of reduced EF.  Unfortunately, he has presented to the ER twice in the interim with right sided flank pain. Imaging revealed enlarged prostate, but no signs of infection, normal CT. Wife reported urinary incontinence and MRI lumbar was obtained, which was negative for cauda equina. sCr was better than baseline and he was discharged on tylenol for presumed MSK etiology.  He presents today for follow up. I was able to connect with him and his wife via telephone. His flank pain has resolved.  BP log reveals 141/93 today.  123/90, 85  123/88  135/90  124/91  123/89  135/90  132/91  130/79  134/84  123/90  He is feeling much better with this pressure. He is also more talkative and states he is recovering from his stroke. He is in better spirits than our last meeting.  The patient does not have symptoms concerning for COVID-19 infection (fever, chills, cough, or new shortness of breath).       Past Medical History:  Diagnosis Date   CKD (chronic kidney disease) stage 2, GFR 60-89 ml/min    Diabetes mellitus without complication ([place])    Ear pain 07/2017   Hypertension    Wasp sting 08/04/2016   of dorsum of left hand near thumb  No past surgical history on file.    Active Medications                                                                                     For TEE; no changes. Harold Clements

## 2019-02-02 NOTE — Interval H&P Note (Signed)
History and Physical Interval Note:  02/02/2019 9:44 AM  Harold Clements Walkins  has presented today for surgery, with the diagnosis of STROKE.  The various methods of treatment have been discussed with the patient and family. After consideration of risks, benefits and other options for treatment, the patient has consented to  Procedure(s): TRANSESOPHAGEAL ECHOCARDIOGRAM (TEE) (N/A) as a surgical intervention.  The patient's history has been reviewed, patient examined, no change in status, stable for surgery.  I have reviewed the patient's chart and labs.  Questions were answered to the patient's satisfaction.     Kirk Ruths

## 2019-02-06 ENCOUNTER — Ambulatory Visit: Payer: Medicaid Other | Attending: Internal Medicine

## 2019-02-06 ENCOUNTER — Other Ambulatory Visit: Payer: Self-pay

## 2019-02-06 ENCOUNTER — Other Ambulatory Visit (HOSPITAL_COMMUNITY)
Admission: RE | Admit: 2019-02-06 | Discharge: 2019-02-06 | Disposition: A | Discharge status: Home / Self Care | Payer: Medicaid Other | Source: Ambulatory Visit | Attending: Cardiovascular Disease | Admitting: Cardiovascular Disease

## 2019-02-06 VITALS — BP 142/90

## 2019-02-06 DIAGNOSIS — Z20828 Contact with and (suspected) exposure to other viral communicable diseases: Secondary | ICD-10-CM | POA: Diagnosis not present

## 2019-02-06 DIAGNOSIS — R4701 Aphasia: Secondary | ICD-10-CM | POA: Diagnosis not present

## 2019-02-06 DIAGNOSIS — R2689 Other abnormalities of gait and mobility: Secondary | ICD-10-CM | POA: Diagnosis not present

## 2019-02-06 DIAGNOSIS — R471 Dysarthria and anarthria: Secondary | ICD-10-CM | POA: Diagnosis not present

## 2019-02-06 DIAGNOSIS — Z01812 Encounter for preprocedural laboratory examination: Secondary | ICD-10-CM | POA: Diagnosis not present

## 2019-02-06 DIAGNOSIS — M6281 Muscle weakness (generalized): Secondary | ICD-10-CM | POA: Insufficient documentation

## 2019-02-06 LAB — SARS CORONAVIRUS 2 (TAT 6-24 HRS): SARS Coronavirus 2: NEGATIVE

## 2019-02-06 NOTE — Therapy (Signed)
[facility] [address] [place], [place], [address] Phone: [phone]   Fax:  [phone]  Physical Therapy Evaluation  Patient Details  Name: Harold Clements MRN: [id number] Date of Birth: 1980-05-16 Referring Provider (PT): Juluis Mire, FNP   Encounter Date: 02/06/2019  PT End of Session - 02/06/19 0846    Visit Number  1    Number of Visits  8    Date for PT Re-Evaluation  [id number]   90 day cert due to scheduling, but 60 plan of care   Authorization Type  Medicaid    PT Start Time  0802    PT Stop Time  0846    PT Time Calculation (min)  44 min    Activity Tolerance  Patient tolerated treatment well    Behavior During Therapy  [facility] for tasks assessed/performed       Past Medical History:  Diagnosis Date  . 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 Diabetes mellitus without complication ([place])   . Ear pain 07/2017  . Hypertension   . Wasp sting 08/04/2016   of dorsum of left hand near thumb    Past Surgical History:  Procedure Laterality Date  . BUBBLE STUDY  02/02/2019   Procedure: BUBBLE STUDY;  Surgeon: Lelon Perla, MD;  Location: [facility] ENDOSCOPY;  Service: Cardiovascular;;  . TEE WITHOUT CARDIOVERSION N/A 02/02/2019   Procedure: TRANSESOPHAGEAL ECHOCARDIOGRAM (TEE);  Surgeon: Lelon Perla, MD;  Location: [facility] ENDOSCOPY;  Service: Cardiovascular;  Laterality: N/A;    Vitals:   02/06/19 0806  BP: (!) 142/90     Subjective Assessment - 02/06/19 0806    Subjective  Pt was referred after right basal ganglia CVA on 01/11/2019.  Pt has CHF but has not been weighing as scale not working. Going to try to replace batteries. PT explained to weigh first thing in morning after using the bathroom to monitor fluid. Checks blood sugar daily and has been running well per his report. Pt reports that since CVA he is having more trouble reading, writing, balance off when first gets up.     Pertinent History  small chronic infarct left thalamus noted, multiple hospitalizations for hypertensive urgency, chronic diastolic heart failure with EF 40-50%, DM type II, CKD stage III, OSA intermittently compliant with CPAP, morbid obesity, and medication noncompliance    Patient Stated Goals  Pt would like to improve his balance.    Currently in Pain?  Yes    Pain Score  0-No pain    Pain Location  Back    Pain Orientation  Lower    Pain Type  Chronic pain    Pain Onset  1 to 4 weeks ago    Pain Frequency  Intermittent    Aggravating Factors   bending, strenuous activities    Pain Relieving Factors  sitting         [facility] PT Assessment - 02/06/19 [id number]      Assessment   Medical Diagnosis  right basal ganglia stroke    Referring Provider (PT)  Juluis Mire, FNP    Onset Date/Surgical Date  01/11/19    Hand Dominance  Left    Prior Therapy  no      Balance Screen   Has the patient fallen in the past 6 months  No    Has the patient had a decrease in activity level because of a fear of falling?   No  Is the patient reluctant to leave their home because of a fear of falling?   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 Currently out on leave. Trying to get disability.    Leisure  basketball, play with children      Cognition   Overall Cognitive  Status  Impaired/Different from baseline    Area of Impairment  --   understanding what he is reading     Observation/Other Assessments-Edema    Edema  --   none noted     Sensation   Light Touch  Appears Intact      Coordination   Gross Motor Movements are Fluid and Coordinated  Yes   intact RAMs   Fine Motor Movements are Fluid and Coordinated  Yes   intact finger opposition     ROM / Strength   AROM / PROM / Strength  Strength      Strength   Strength Assessment Site  Shoulder;Elbow;Hand;Hip;Knee;Ankle    Right/Left Shoulder  Right;Left    Right Shoulder Flexion  5/5    Left Shoulder Flexion  5/5    Right/Left Elbow  Right;Left    Right Elbow Flexion  5/5    Right Elbow Extension  5/5    Left Elbow Flexion  5/5    Left Elbow Extension  5/5    Right/Left hand  Right;Left    Right Hand Gross Grasp  Functional    Left Hand Gross Grasp  Functional    Right/Left Hip  Right;Left    Right Hip Flexion  4+/5    Right Hip Extension  3+/5   lacking about 10 degrees bilateral   Left Hip Flexion  4+/5    Left Hip Extension  3+/5    Right/Left Knee  Right;Left    Right Knee Flexion  5/5    Right Knee Extension  5/5    Left Knee Flexion  4+/5    Left Knee Extension  5/5    Right/Left Ankle  Right;Left    Right Ankle Dorsiflexion  5/5    Left Ankle Dorsiflexion  5/5      Bed Mobility   Bed Mobility  Rolling Right;Rolling Left;Supine to Sit;Sit to Supine    Rolling Right  Independent    Rolling Left  Independent    Supine to Sit  Independent    Sit to Supine  Independent      Transfers   Transfers  Sit to Stand;Stand to Sit    Sit to Stand  7: Independent    Stand to Sit  7: Independent      Ambulation/Gait   Ambulation/Gait  Yes    Ambulation/Gait Assistance  7: Independent    Ambulation  Distance (Feet)  100 Feet    Gait Pattern  Step-through pattern;Trendelenburg;Decreased hip/knee flexion - right;Decreased hip/knee flexion - left   scuffing feet with sandals on    Ambulation Surface  Level;Indoor    Gait velocity  13.39sec=0.41m/s    Stairs  Yes    Stairs Assistance  6: Modified independent (Device/Increase time)    Stair Management Technique  Two rails;Alternating pattern      6 Minute Walk- Baseline   6 Minute Walk- Baseline  yes    BP (mmHg)  142/90      6 Minute walk- Post Test   6 Minute Walk Post Test  yes    BP (mmHg)  (!) 162/110      6 minute walk test results    Aerobic Endurance Distance Walked  970    Endurance additional comments  5/10 low back pain and 3/10 RPE but no SOB      Functional Gait  Assessment   Gait assessed   Yes    Gait Level Surface  Walks 20 ft in less than 7 sec but greater than 5.5 sec, uses assistive device, slower speed, mild gait deviations, or deviates 6-10 in outside of the 12 in walkway width.    Change in Gait Speed  Able to change speed, demonstrates mild gait deviations, deviates 6-10 in outside of the 12 in walkway width, or no gait deviations, unable to achieve a major change in velocity, or uses a change in velocity, or uses an assistive device.    Gait with Horizontal Head Turns  Performs head turns smoothly with slight change in gait velocity (eg, minor disruption to smooth gait path), deviates 6-10 in outside 12 in walkway width, or uses an assistive device.    Gait with Vertical Head Turns  Performs task with slight change in gait velocity (eg, minor disruption to smooth gait path), deviates 6 - 10 in outside 12 in walkway width or uses assistive device    Gait and Pivot Turn  Pivot turns safely within 3 sec and stops quickly with no loss of balance.    Step Over Obstacle  Is able to step over 2 stacked shoe boxes taped together (9 in total height) without changing gait speed. No evidence of imbalance.    Gait with Narrow Base of Support  Ambulates 7-9 steps.    Gait with Eyes Closed  Walks 20 ft, no assistive devices, good speed, no evidence of imbalance, normal gait pattern, deviates no more than  6 in outside 12 in walkway width. Ambulates 20 ft in less than 7 sec.    Ambulating Backwards  Walks 20 ft, no assistive devices, good speed, no evidence for imbalance, normal gait    Steps  Alternating feet, must use rail.    Total Score  24                Objective measurements completed on examination: See above findings.              PT Education - 02/06/19 0859    Education Details  Pt instructed in PT plan of care. Instructed to walk daily but monitor BP and not walk if bottom number >90    Person(s) Educated  Patient    Methods  Explanation    Comprehension  Verbalized understanding       PT Short Term Goals - 02/06/19 0907      PT SHORT TERM GOAL #1   Title  Pt will be  independent with HEP to continue strength, balance and mobility gains on own.    Baseline  no current HEP    Time  4    Period  Weeks    Status  New    [place] Date  03/08/19      PT SHORT TERM GOAL #2   Title  Pt will increase gait speed to >0.29m/s for improved gait safety in community    Baseline  0.29m/s on 02/06/2019    Time  4    Period  Weeks    Status  New    Target Date  03/08/19        PT [place] Term Goals - 02/06/19 0909      PT [place] TERM GOAL #1   Title  Pt will increase 6 min walk to > 1200' for improved community mobility and activity tolerance.    Baseline  970' on 02/06/2019    Time  8    Period  Weeks    Status  New    Target Date  04/07/19      PT [place] TERM GOAL #2   Title  Pt will report decrease in low back pain from 5/10 to <3/10 with activities for improved function.    Baseline  5/10 with longer gait distances    Time  8    Period  Weeks    Status  New    Target Date  04/07/19      PT [place] TERM GOAL #3   Title  Pt will improve FGA from 24/30 to >26/30 for improved balance and functional mobility.    Baseline  24/30 on 02/06/2019    Time  8    Period  Weeks    Status  New    Target Date  04/07/19      PT [place] TERM GOAL #4   Title  Pt will  ambulate up/down 4 steps with reciprocal pattern without rail for improved functional strength and balance.    Baseline  currently needs bilateral rail support    Time  8    Period  Weeks    Status  New    Target Date  04/07/19             Plan - 02/06/19 0901    Clinical Impression Statement  Pt is 38 y/o male with ischemic stroke right basal ganglia 01/11/19. Also small chronic infart left thalamus noted. Pt reporting difficulty with reading, writing and balance. Pt at medium fall risk based on FGA score of 24/30. Ambulating at slower gait speed than safe community ambulator at 0.34m/s. Pt is reporting increased LBP with longer walks on 6 min testing up to 5/10. Pt will benefit from skilled PT to address strength, balance, pain and gait deficits to maximize function in community.    Personal Factors and Comorbidities  Comorbidity 3+    Comorbidities  HTN, DM, CHF    Examination-Activity Limitations  Locomotion Level;Stairs    Examination-Participation Restrictions  Community Activity    Stability/Clinical Decision Making  Evolving/Moderate complexity    Clinical Decision Making  Moderate    Rehab Potential  Good    PT Frequency  1x / week    PT Duration  8 weeks    PT Treatment/Interventions  ADLs/Self Care Home Management;Cryotherapy;Moist Heat;DME Instruction;Gait training;Stair training;Functional mobility training;Therapeutic activities;Patient/family education;Neuromuscular re-education;Balance training;Therapeutic exercise;Manual techniques;Passive range of motion;Vestibular;Energy conservation    PT Next Visit Plan  Assess vision to make sure no issues, dynamic gait  activities to establish initial HEP. Monitor BP. Check how visits worked out with Medicaid [place]?    Consulted and Agree with Plan of Care  Patient       Patient will benefit from skilled therapeutic intervention in order to improve the following deficits and impairments:  Abnormal gait, Cardiopulmonary status  limiting activity, Decreased activity tolerance, Decreased balance, Decreased endurance, Decreased mobility, Decreased strength, Impaired flexibility, Pain, Postural dysfunction  Visit Diagnosis: Other abnormalities of gait and mobility  Muscle weakness (generalized)     Problem List Patient Active Problem List   Diagnosis Date Noted  . Hypertensive heart disease 01/12/2019  . Ischemic stroke ([place]) 01/11/2019  . Stroke ([place]) 01/11/2019  . Hypertensive emergency 11/28/2018  . Hypertensive crisis 11/27/2018  . Obesity, Class III, BMI 40-49.9 (morbid obesity) ([place]) 11/27/2018  . Mastoiditis 08/12/2017  . Chronic kidney disease, stage 3a 08/17/2016  . Hypertension associated with diabetes ([place]) 08/06/2016  . Diabetes ([place]) 08/06/2016    Electa Sniff, PT, DPT, NCS 02/06/2019, 9:18 AM  [facility] [address] [place], [place], [address] Phone: [phone]   Fax:  [phone]  Name: Harold Clements MRN: [id number] Date of Birth: 1980/05/18

## 2019-02-08 ENCOUNTER — Ambulatory Visit (HOSPITAL_BASED_OUTPATIENT_CLINIC_OR_DEPARTMENT_OTHER): Payer: Medicaid Other | Attending: Physician Assistant | Admitting: Cardiovascular Disease

## 2019-02-08 ENCOUNTER — Inpatient Hospital Stay (INDEPENDENT_AMBULATORY_CARE_PROVIDER_SITE_OTHER): Payer: Medicaid Other | Admitting: Primary Care

## 2019-02-08 ENCOUNTER — Ambulatory Visit: Payer: Medicaid Other

## 2019-02-08 ENCOUNTER — Other Ambulatory Visit: Payer: Self-pay

## 2019-02-08 DIAGNOSIS — R4701 Aphasia: Secondary | ICD-10-CM | POA: Diagnosis not present

## 2019-02-08 DIAGNOSIS — M6281 Muscle weakness (generalized): Secondary | ICD-10-CM | POA: Diagnosis not present

## 2019-02-08 DIAGNOSIS — R400 Somnolence: Secondary | ICD-10-CM | POA: Diagnosis not present

## 2019-02-08 DIAGNOSIS — R471 Dysarthria and anarthria: Secondary | ICD-10-CM

## 2019-02-08 DIAGNOSIS — R0902 Hypoxemia: Secondary | ICD-10-CM | POA: Diagnosis not present

## 2019-02-08 DIAGNOSIS — R2689 Other abnormalities of gait and mobility: Secondary | ICD-10-CM | POA: Diagnosis not present

## 2019-02-08 DIAGNOSIS — R0683 Snoring: Secondary | ICD-10-CM | POA: Diagnosis not present

## 2019-02-08 NOTE — Therapy (Signed)
[facility] [address] [place], [place], [address] Phone: [phone]   Fax:  [phone]  Speech Language Pathology Evaluation  Patient Details  Name: Harold Clements MRN: [id number] Date of Birth: [birth year] Referring Provider (SLP): Juluis Mire, NP   Encounter Date: 02/08/2019  End of Session - 02/08/19 1512    Visit Number  1    Number of Visits  11    Date for SLP Re-Evaluation  04/26/19   adjusting for date for ST beginning in January 2021   Authorization Type  Medicaid    Authorization - Number of Visits  1    SLP Start Time  1106   4-5 mintues late   SLP Stop Time   1145    SLP Time Calculation (min)  39 min    Activity Tolerance  Patient tolerated treatment well       Past Medical History:  Diagnosis Date  . 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 Diabetes mellitus without complication ([place])   . Ear pain 07/2017  . Hypertension   . Wasp sting 08/04/2016   of dorsum of left hand near thumb    Past Surgical History:  Procedure Laterality Date  . BUBBLE STUDY  02/02/2019   Procedure: BUBBLE STUDY;  Surgeon: Lelon Perla, MD;  Location: [facility] ENDOSCOPY;  Service: Cardiovascular;;  . TEE WITHOUT CARDIOVERSION N/A 02/02/2019   Procedure: TRANSESOPHAGEAL ECHOCARDIOGRAM (TEE);  Surgeon: Lelon Perla, MD;  Location: [facility] ENDOSCOPY;  Service: Cardiovascular;  Laterality: N/A;    There were no vitals filed for this visit.      SLP Evaluation [facility] - 02/08/19 1108      SLP Visit Information   SLP Received On  02/08/19    Referring Provider (SLP)  Juluis Mire, NP    Onset Date  01-11-19    Medical Diagnosis  CVA      Subjective   Patient/Family Stated Goal  Pt tells SLP he wants to say longer words more distinctly.       Balance Screen   Has the patient fallen in the past 6 months  No      Cognition   Overall Cognitive Status  Impaired/Different from baseline     Area of Impairment  --   processing speed     Auditory Comprehension   Overall Auditory Comprehension  --   further testing necessary - aud comp vs. processing speed     Reading Comprehension   Reading Status  Within funtional limits   pt reports he "can't read" - very likely expressive aphasia     Verbal Expression   Overall Verbal Expression  Impaired    Other Verbal Expression Comments  Pt with slower than WNL verbal expression - taking more time to communicate. Worse with more descriptive langauge needed - e.g., when pt asked to describe a movie or a synopsis of what he read.       Oral Motor/Sensory Function   Overall Oral Motor/Sensory Function  Impaired    Labial ROM  Within Functional Limits   with cues   Labial Symmetry  Within Functional Limits    Labial Coordination  Reduced    Lingual ROM  Within Functional Limits   with cues   Lingual Symmetry  Within Functional Limits    Lingual Strength  Reduced    Lingual Coordination  Reduced    Overall Oral Motor/Sensory Function  Reduced coordination with lingual and  labial musculature; ROM appears WNL with cues to incr movement.       Motor Speech   Overall Motor Speech  Impaired    Respiration  Impaired    Level of Impairment  Sentence    Phonation  Low vocal intensity   < 70dB in conversation   Articulation  Impaired    Level of Impairment  Phrase    Intelligibility  Intelligible    Motor Planning  Witnin functional limits                      SLP Education - 02/08/19 1512    Education Details  possible goals, thearpy plan to work on dysarthria and aphasia    Person(s) Educated  Patient    Methods  Explanation    Comprehension  Verbalized understanding       SLP Short Term Goals - 02/08/19 1515      SLP SHORT TERM GOAL #1   Title  pt will demo HEP for dysarthria with rare min A x2 sessions    Baseline  not provided yet    Time  3    Period  Weeks   or 7 total visits   Status  New       SLP SHORT TERM GOAL #2   Title  pt will improve expressive langauage by completing simple functional naming tasks with 80% success over two sessions    Baseline  20% success    Time  3    Period  Weeks    Status  New      SLP SHORT TERM GOAL #3   Title  pt will report people do not ask him to repeat his message during the day    Baseline  he is asked to repeat multiple times per day    Time  3    Period  Weeks    Status  New      SLP SHORT TERM GOAL #4   Title  pt will communicate in 10 minutes simple-mod complex conversation with 95% of natural speech rate over two sessions    Baseline  ~35% natural speech    Time  3    Period  Weeks    Status  New      SLP SHORT TERM GOAL #5   Title  pt will use abdominal breathing (AB) in sentence responses 80% of the time    Time  3    Period  Weeks    Status  New       SLP [place] Term Goals - 02/08/19 1521      SLP Stro[place] TERM GOAL #1   Title  pt will functionally participate in10 minutes mod complex conversation using language compensations for 90% natural speech rate    Baseline  ~35% natural speech rate simple-mod complex    Time  5    Period  Weeks   or 11 total visits, for all LTGs   Status  New      SLP [place] TERM GOAL #2   Title  pt will report request for repeats no more than one every other day between 2 sessions    Baseline  every day request for repeat    Time  5    Period  Weeks    Status  New      SLP [place] TERM GOAL #3   Title  pt will experience anomic episodes <1 each day x 3 sessions  Baseline  x2/day    Time  5    Period  Weeks    Status  New      SLP [place] TERM GOAL #4   Title  pt will use abdominal breathing (AB) in 10 minutes simple-mod complex conversation 75% of the time over two sessions    Time  5    Period  Weeks    Status  New       Plan - 02/08/19 1140    Clinical Impression Statement  Pt presents today with mild dysarthria (R47.1) characterized by imprecise phonemes and decr'd breath support  for speech which pt tells SLP results in people asking him to repeat himself more often than pre-CVA. Pt also demonstrates an expressive aphasia (R47.01) noted today in tasks of simple to mod complexity (e.g., describing an animal he saw in a movie recently). Pt may require a formal language assessment during first 1-2 sessions. Pt will benefit from skilled ST focusing on improving speech intelligibility as well as improving pt ability to communicate with family and community.    Speech Therapy Frequency  2x / week    Duration  --   8 weeks or 17 visits (medicaid - 10 visits)   Treatment/Interventions  Language facilitation;Cueing hierarchy;SLP instruction and feedback;Compensatory strategies;Cognitive reorganization;Internal/external aids;Patient/family education;Functional tasks;Multimodal communcation approach;Oral motor exercises;Environmental controls    Potential to Achieve Goals  Good    Consulted and Agree with Plan of Care  Patient       Patient will benefit from skilled therapeutic intervention in order to improve the following deficits and impairments:   Aphasia - Plan: SLP plan of care cert/re-cert  Dysarthria and anarthria - Plan: SLP plan of care cert/re-cert    Problem List Patient Active Problem List   Diagnosis Date Noted  . Hypertensive heart disease 01/12/2019  . Ischemic stroke ([place]) 01/11/2019  . Stroke ([place]) 01/11/2019  . Hypertensive emergency 11/28/2018  . Hypertensive crisis 11/27/2018  . Obesity, Class III, BMI 40-49.9 (morbid obesity) ([place]) 11/27/2018  . Mastoiditis 08/12/2017  . Chronic kidney disease, stage 3a 08/17/2016  . Hypertension associated with diabetes ([place]) 08/06/2016  . Diabetes ([place]) 08/06/2016    [facility] 02/08/2019, 3:29 PM  [facility] [address] [place], [place], [address] Phone: [phone]   Fax:  [phone]  Name: Harold Clements MRN: [id number] Date of Birth:  Aug 19, 1980

## 2019-02-14 ENCOUNTER — Telehealth (INDEPENDENT_AMBULATORY_CARE_PROVIDER_SITE_OTHER): Payer: Medicaid Other | Admitting: Primary Care

## 2019-02-18 ENCOUNTER — Encounter (HOSPITAL_BASED_OUTPATIENT_CLINIC_OR_DEPARTMENT_OTHER): Payer: Self-pay | Admitting: Cardiovascular Disease

## 2019-02-18 NOTE — Procedures (Signed)
Patient Name: Harold Clements, Harold Clements Date: 02/08/2019 Gender: Male D.O.B: Aug 25, 1980 Age (years): 38 Referring Provider: Tami Lin Duke Height (inches): 70 Interpreting Physician: Shelva Majestic MD, ABSM Weight (lbs): 262 RPSGT: Gwenyth Allegra BMI: 38 MRN: [id number] Neck Size: 19.50  CLINICAL INFORMATION Sleep Study Type: NPSG  Indication for sleep study: Excessive Daytime Sleepiness, Snoring  Epworth Sleepiness Score: 9  SLEEP STUDY TECHNIQUE As per the AASM Manual for the Scoring of Sleep and Associated Events v2.3 (April 2016) with a hypopnea requiring 4% desaturations.  The channels recorded and monitored were frontal, central and occipital EEG, electrooculogram (EOG), submentalis EMG (chin), nasal and oral airflow, thoracic and abdominal wall motion, anterior tibialis EMG, snore microphone, electrocardiogram, and pulse oximetry.  MEDICATIONS amLODipine (NORVASC) 10 MG tablet  atorvastatin (LIPITOR) 80 MG tablet  canagliflozin (INVOKANA) 100 MG TABS tablet  carvedilol (COREG) 25 MG tablet  clopidogrel (PLAVIX) 75 MG tablet  furosemide (LASIX) 40 MG tablet  Insulin Isophane & Regular Human (NOVOLIN 70/30 FLEXPEN) (70-30) 100 UNIT/ML PEN  metFORMIN (GLUCOPHAGE) 1000 MG tablet(Expired)  methocarbamol (ROBAXIN) 500 MG tablet  minoxidil (LONITEN) 10 MG tablet(Expired)  potassium chloride (KLOR-CON) 20 MEQ packet   Medications self-administered by patient taken the night of the study : N/A  SLEEP ARCHITECTURE The study was initiated at 10:22:06 PM and ended at 4:30:16 AM.  Sleep onset time was 317.0 minutes and the sleep efficiency was 13.9%%. The total sleep time was 51.2 minutes.  Stage REM latency was N/A minutes.  The patient spent 19.5%% of the night in stage N1 sleep, 80.5%% in stage N2 sleep, 0.0%% in stage N3 and 0% in REM.  Alpha intrusion was absent.  Supine sleep was 0.00%.  RESPIRATORY PARAMETERS The overall apnea/hypopnea index (AHI) was 1.2  per hour. The respiratory disturbance index (RDI) was 5.9/h. There were 0 total apneas, including 0 obstructive, 0 central and 0 mixed apneas. There were 1 hypopneas and 4 RERAs.  The AHI during Stage REM sleep was N/A per hour.  AHI while supine was N/A per hour.  The mean oxygen saturation was 96.5%. The minimum SpO2 during sleep was 94.0%.  Soft snoring was noted during this study.  CARDIAC DATA The 2 lead EKG demonstrated sinus rhythm. The mean heart rate was 80.3 beats per minute. Other EKG findings include: None  LEG MOVEMENT DATA The total PLMS were 0 with a resulting PLMS index of 0.0. Associated arousal with leg movement index was 0.0 .  IMPRESSIONS - Very limited study with sleep efficiency at only 13.9% and absence of REM sleep. - No significant obstructive sleep apnea occurred during this suboptimal study (AHI 1.2/h; RDI 5.9/h)). - No significant central sleep apnea occurred during this study (CAI = 0.0/h). - The patient  no oxygen desaturation during the study (Min O2 = 94.0%) - The patient snored with soft snoring volume. - No cardiac abnormalities were noted during this study. - Clinically significant periodic limb movements did not occur during sleep. No significant associated arousals.  DIAGNOSIS - Snoring  RECOMMENDATIONS - This was an inadequate study with minimal sleep at only 13.9% and absence of REM sleep.  The patient works third shift and if the patient continues to have symptoms a daytime study may be considered. - Effort should be made to optimize nasal and oropharyngeal patency.  - Avoid alcohol, sedatives and other CNS depressants that may worsen sleep apnea and disrupt normal sleep architecture. - Sleep hygiene should be reviewed to assess factors that may improve sleep  quality. - Weight management (BMI 39) and regular exercise should be initiated or continued if appropriate.  [Electronically signed] 02/18/2019 02:49 PM  Shelva Majestic MD,FACC,  ABSM Diplomate, American Board of Sleep Medicine   NPI: [id number] [place] PH: [phone]   FX: [phone] [place]

## 2019-02-20 ENCOUNTER — Telehealth: Payer: Self-pay | Admitting: *Deleted

## 2019-02-20 NOTE — Telephone Encounter (Signed)
Left message to return a call to discuss sleep study results and recommendations. 

## 2019-02-23 ENCOUNTER — Other Ambulatory Visit: Payer: Self-pay

## 2019-02-23 ENCOUNTER — Encounter: Payer: Self-pay | Admitting: Physical Therapy

## 2019-02-23 ENCOUNTER — Ambulatory Visit: Payer: Medicaid Other | Admitting: Physical Therapy

## 2019-02-23 ENCOUNTER — Other Ambulatory Visit: Payer: Self-pay | Admitting: Cardiovascular Disease

## 2019-02-23 VITALS — BP 158/111 | HR 91

## 2019-02-23 DIAGNOSIS — R4701 Aphasia: Secondary | ICD-10-CM | POA: Diagnosis not present

## 2019-02-23 DIAGNOSIS — R0683 Snoring: Secondary | ICD-10-CM

## 2019-02-23 DIAGNOSIS — E1159 Type 2 diabetes mellitus with other circulatory complications: Secondary | ICD-10-CM

## 2019-02-23 DIAGNOSIS — R2689 Other abnormalities of gait and mobility: Secondary | ICD-10-CM | POA: Diagnosis not present

## 2019-02-23 DIAGNOSIS — M6281 Muscle weakness (generalized): Secondary | ICD-10-CM | POA: Diagnosis not present

## 2019-02-23 DIAGNOSIS — I152 Hypertension secondary to endocrine disorders: Secondary | ICD-10-CM

## 2019-02-23 DIAGNOSIS — R471 Dysarthria and anarthria: Secondary | ICD-10-CM | POA: Diagnosis not present

## 2019-02-23 NOTE — Telephone Encounter (Signed)
Patient notified of sleep study results and recommendations. He request to be scheduled for a daytime sleep study. Message will be sent to Dr Claiborne Billings for further review and additional orders.

## 2019-02-23 NOTE — Therapy (Signed)
[facility] [address] [place], [place], [address] Phone: [phone]   Fax:  [phone]  Physical Therapy Treatment  Patient Details  Name: Harold Clements MRN: [id number] Date of Birth: April 09, 1980 Referring Provider (PT): Juluis Mire, FNP   Encounter Date: 02/23/2019  PT End of Session - 02/23/19 1100    Visit Number  2    Number of Visits  8    Date for PT Re-Evaluation  99991111   90 day cert due to scheduling, but 60 plan of care   Authorization Type  Medicaid-2 visits authorized 02/23/19-03/01/19    Authorization Time Period  12/24-12/30    Authorization - Visit Number  1    Authorization - Number of Visits  2    PT Start Time  1034   Therapist late-ran over previous session   PT Stop Time  1054   session ended early due to elevated BP   PT Time Calculation (min)  20 min    Activity Tolerance  Treatment limited secondary to medical complications (Comment)   Unable to perform physical activity in session due to BP   Behavior During Therapy  [facility] for tasks assessed/performed       Past Medical History:  Diagnosis Date  . 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 Diabetes mellitus without complication ([place])   . Ear pain 07/2017  . Hypertension   . Wasp sting 08/04/2016   of dorsum of left hand near thumb    Past Surgical History:  Procedure Laterality Date  . BUBBLE STUDY  02/02/2019   Procedure: BUBBLE STUDY;  Surgeon: Lelon Perla, MD;  Location: [facility] ENDOSCOPY;  Service: Cardiovascular;;  . TEE WITHOUT CARDIOVERSION N/A 02/02/2019   Procedure: TRANSESOPHAGEAL ECHOCARDIOGRAM (TEE);  Surgeon: Lelon Perla, MD;  Location: [facility] ENDOSCOPY;  Service: Cardiovascular;  Laterality: N/A;    Vitals:   02/23/19 1041 02/23/19 1046  BP: (!) 178/113 (!) 158/111  Pulse: 86 91    Subjective Assessment - 02/23/19 1036    Subjective  Nothing new except having a bed wetting problem 3-4  times this week.  Haven't let the doctor know yet.    Pertinent History  small chronic infarct left thalamus noted, multiple hospitalizations for hypertensive urgency, chronic diastolic heart failure with EF 40-50%, DM type II, CKD stage III, OSA intermittently compliant with CPAP, morbid obesity, and medication noncompliance    Patient Stated Goals  Pt would like to improve his balance.    Currently in Pain?  No/denies    Pain Onset  1 to 4 weeks ago                       [facility] Adult PT Treatment/Exercise - 02/23/19 0001      Self-Care   Self-Care  Other Self-Care Comments    Other Self-Care Comments   Provided education regarding symptoms of HTN and of CVA.  Pt not reporting any symptoms; educated patient in importance of knowing CVA symptoms and calling 911 if any symptoms occur.  Discussed POC for January, based on evaluating therapist's frequency in POC.             PT Education - 02/23/19 1059    Education Details  HTN/CVA warning signs and symptoms    Person(s) Educated  Patient    Methods  Explanation;Handout    Comprehension  Verbalized understanding       PT Short Term Goals -  02/06/19 0907      PT SHORT TERM GOAL #1   Title  Pt will be independent with HEP to continue strength, balance and mobility gains on own.    Baseline  no current HEP    Time  4    Period  Weeks    Status  New    Target Date  03/08/19      PT SHORT TERM GOAL #2   Title  Pt will increase gait speed to >0.78m/s for improved gait safety in community    Baseline  0.43m/s on 02/06/2019    Time  4    Period  Weeks    Status  New    Target Date  03/08/19        PT [place] Term Goals - 02/06/19 0909      PT [place] TERM GOAL #1   Title  Pt will increase 6 min walk to > 1200' for improved community mobility and activity tolerance.    Baseline  970' on 02/06/2019    Time  8    Period  Weeks    Status  New    Target Date  04/07/19      PT [place] TERM GOAL #2   Title  Pt will  report decrease in low back pain from 5/10 to <3/10 with activities for improved function.    Baseline  5/10 with longer gait distances    Time  8    Period  Weeks    Status  New    Target Date  04/07/19      PT Jeff TERM GOAL #3   Title  Pt will improve FGA from 24/30 to >26/30 for improved balance and functional mobility.    Baseline  24/30 on 02/06/2019    Time  8    Period  Weeks    Status  New    Target Date  04/07/19      PT [place] TERM GOAL #4   Title  Pt will ambulate up/down 4 steps with reciprocal pattern without rail for improved functional strength and balance.    Baseline  currently needs bilateral rail support    Time  8    Period  Weeks    Status  New    Target Date  04/07/19            Plan - 02/23/19 1102    Clinical Impression Statement  Pt returns to [facility] today for first visit since eval.  He reports not having had BP medications this morning, as he was in a rush to get to therapy.  BP assessed, and BP elevated too much for physical activity in PT session; educated on warning signs and symptoms of CVA.    Personal Factors and Comorbidities  Comorbidity 3+    Comorbidities  HTN, DM, CHF    Examination-Activity Limitations  Locomotion Level;Stairs    Examination-Participation Restrictions  Community Activity    Stability/Clinical Decision Making  Evolving/Moderate complexity    Rehab Potential  Good    PT Frequency  1x / week    PT Duration  8 weeks    PT Treatment/Interventions  ADLs/Self Care Home Management;Cryotherapy;Moist Heat;DME Instruction;Gait training;Stair training;Functional mobility training;Therapeutic activities;Patient/family education;Neuromuscular re-education;Balance training;Therapeutic exercise;Manual techniques;Passive range of motion;Vestibular;Energy conservation    PT Next Visit Plan  Assess vision to make sure no issues, dynamic gait activities to establish initial HEP. Monitor BP. Will need to assess STGs and resubmit for Medicaid  auth (check with speech  about who wants 3 visits for January)    Consulted and Agree with Plan of Care  Patient       Patient will benefit from skilled therapeutic intervention in order to improve the following deficits and impairments:  Abnormal gait, Cardiopulmonary status limiting activity, Decreased activity tolerance, Decreased balance, Decreased endurance, Decreased mobility, Decreased strength, Impaired flexibility, Pain, Postural dysfunction  Visit Diagnosis: Other abnormalities of gait and mobility     Problem List Patient Active Problem List   Diagnosis Date Noted  . Hypertensive heart disease 01/12/2019  . Ischemic stroke ([place]) 01/11/2019  . Stroke ([place]) 01/11/2019  . Hypertensive emergency 11/28/2018  . Hypertensive crisis 11/27/2018  . Obesity, Class III, BMI 40-49.9 (morbid obesity) ([place]) 11/27/2018  . Mastoiditis 08/12/2017  . Chronic kidney disease, stage 3a 08/17/2016  . Hypertension associated with diabetes ([place]) 08/06/2016  . Diabetes ([place]) 08/06/2016    Marland Reine W. 02/23/2019, 11:04 AM Frazier Butt., PT [facility] [address] [place], [place], [address] Phone: [phone]   Fax:  [phone]  Name: Harold Clements MRN: [id number] Date of Birth: 07-15-1980

## 2019-02-26 NOTE — Telephone Encounter (Signed)
I discussed with Mariann Laster in office.  We will try to schedule for a daytime sleep study since the patient works the night shift and typically sleeps in a day.  She will be trying to contact [place] at the sleep lab to set this up

## 2019-02-27 NOTE — Telephone Encounter (Signed)
Left message for patient to call regarding Sleep Study (split night) appointment and COVID screening appointment

## 2019-02-28 ENCOUNTER — Ambulatory Visit: Payer: Medicaid Other | Admitting: Physical Therapy

## 2019-03-02 ENCOUNTER — Ambulatory Visit: Payer: Medicaid Other | Admitting: Physical Therapy

## 2019-03-06 NOTE — Telephone Encounter (Signed)
Spoke with patient regarding Split niight sleep study scheduled Tuesday 03/21/19 at 7:00 am at the [facility]---Covid 19 testing in Saturday 1/16/21at [place]  WIl also mail information to patient

## 2019-03-07 ENCOUNTER — Telehealth: Payer: Self-pay | Admitting: *Deleted

## 2019-03-07 NOTE — Telephone Encounter (Signed)
Appointmnt with Dr.Foster on 03/27/19

## 2019-03-09 ENCOUNTER — Ambulatory Visit: Payer: Medicaid Other | Attending: Internal Medicine | Admitting: Speech Pathology

## 2019-03-09 ENCOUNTER — Ambulatory Visit: Payer: Medicaid Other | Admitting: Occupational Therapy

## 2019-03-09 ENCOUNTER — Ambulatory Visit: Payer: Medicaid Other | Admitting: Physical Therapy

## 2019-03-09 DIAGNOSIS — R471 Dysarthria and anarthria: Secondary | ICD-10-CM | POA: Insufficient documentation

## 2019-03-09 DIAGNOSIS — R4701 Aphasia: Secondary | ICD-10-CM | POA: Insufficient documentation

## 2019-03-13 ENCOUNTER — Ambulatory Visit: Payer: Medicaid Other

## 2019-03-13 ENCOUNTER — Other Ambulatory Visit: Payer: Self-pay

## 2019-03-13 DIAGNOSIS — R4701 Aphasia: Secondary | ICD-10-CM

## 2019-03-13 DIAGNOSIS — R471 Dysarthria and anarthria: Secondary | ICD-10-CM

## 2019-03-13 NOTE — Patient Instructions (Signed)
  If things are still going well next week and you don't notice anything wrong with your talking or your thinking we will discharge ST.

## 2019-03-13 NOTE — Therapy (Signed)
[facility] [address] [place], [place], [address] Phone: [phone]   Fax:  [phone]  Speech Language Pathology Treatment  Patient Details  Name: Harold Clements MRN: [id number] Date of Birth: Feb 13, 1981 Referring Provider (SLP): Juluis Mire, NP   Encounter Date: 03/13/2019  End of Session - 03/13/19 1522    Visit Number  2    Number of Visits  11    Date for SLP Re-Evaluation  04/26/19    Authorization Type  Medicaid    Authorization Time Period  03-29-19    Authorization - Visit Number  1    Authorization - Number of Visits  3    SLP Start Time  [id number]    SLP Stop Time   1400    SLP Time Calculation (min)  42 min    Activity Tolerance  Patient tolerated treatment well       Past Medical History:  Diagnosis Date  . 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 Diabetes mellitus without complication ([place])   . Ear pain 07/2017  . Hypertension   . Wasp sting 08/04/2016   of dorsum of left hand near thumb    Past Surgical History:  Procedure Laterality Date  . BUBBLE STUDY  02/02/2019   Procedure: BUBBLE STUDY;  Surgeon: Lelon Perla, MD;  Location: [facility] ENDOSCOPY;  Service: Cardiovascular;;  . TEE WITHOUT CARDIOVERSION N/A 02/02/2019   Procedure: TRANSESOPHAGEAL ECHOCARDIOGRAM (TEE);  Surgeon: Lelon Perla, MD;  Location: [facility] ENDOSCOPY;  Service: Cardiovascular;  Laterality: N/A;    There were no vitals filed for this visit.  Subjective Assessment - 03/13/19 1317    Subjective  Pt arrives today reported improvement in both expressive language and dysarthria.Pt now denies any difficulty with talking or his language.    Currently in Pain?  No/denies            ADULT SLP TREATMENT - 03/13/19 1322      General Information   Behavior/Cognition  Alert;Cooperative;Pleasant mood      Treatment Provided   Treatment provided  Cognitive-Linquistic      Cognitive-Linquistic  Treatment   Treatment focused on  Aphasia    Skilled Treatment  SLP completed Western Aphasia Battery - Revised. Pt tested as WNL (only errors on entire test were on sentence completion (? correct response), and pt stated "the" instead of "my" on "pack my box with five dozen jugs of liquid detergent." Pt states "I need to get a job but I'm stumbling after I take a few steps and I can't hold a job like that." Pt denies he thinks anything with his speech is holding him back from finding a job, upon direct questioning. He states he is independently taking his meds; Family is verifying he has taken them, whereas 3-4 weeks ago they were reminding him. Pt told SLP correctly that he was done with therapy for today, and he told SLP wife had OB-GYN appointment later this afternoon that he had to go home to be with his kids for.       Assessment / Recommendations / Plan   Plan  Other (Comment)   once/week due to progress; pt like to keep next 2 apointment     Progression Toward Goals   Progression toward goals  Progressing toward goals   pt agrees if still WNL/no deficits noted next visit-will D/C      SLP Education - 03/13/19 1522    Education  Details  decr frequency fue to pgrogress, d/c next visit if everything remains WNL    Person(s) Educated  Patient    Methods  Explanation    Comprehension  Verbalized understanding       SLP Short Term Goals - 03/13/19 1525      SLP SHORT TERM GOAL #1   Title  pt will demo HEP for dysarthria with rare min A x2 sessions    Baseline  not provided yet    Time  3    Period  Weeks   or 7 total visits   Status  Deferred      SLP SHORT TERM GOAL #2   Title  pt will improve expressive langauage by completing simple functional naming tasks with 80% success over two sessions    Baseline  20% success    Time  3    Period  Weeks    Status  Deferred      SLP SHORT TERM GOAL #3   Title  pt will report people do not ask him to repeat his message during the day     Baseline  he is asked to repeat multiple times per day    Status  Achieved      SLP SHORT TERM GOAL #4   Title  pt will communicate in 10 minutes simple-mod complex conversation with 95% of natural speech rate over two sessions    Baseline  ~35% natural speech; 03-13-19    Time  3    Period  Weeks    Status  On-going      SLP SHORT TERM GOAL #5   Title  pt will use abdominal breathing (AB) in sentence responses 80% of the time    Time  3    Period  Weeks    Status  Deferred       SLP [place] Term Goals - 03/13/19 [place] #1   Title  pt will functionally participate in10 minutes mod complex conversation using language compensations for 90% natural speech rate    Baseline  ~35% natural speech rate simple-mod complex    Time  5    Period  Weeks   or 11 total visits, for all LTGs   Status  Deferred      SLP [place] TERM GOAL #2   Title  pt will report request for repeats no more than one every other day between 2 sessions    Baseline  every day request for repeat    Time  5    Period  Weeks    Status  Deferred      SLP [place] TERM GOAL #3   Title  pt will experience anomic episodes <1 each day x 3 sessions    Baseline  x2/day; 03-13-19    Time  5    Period  Weeks    Status  On-going      SLP [place] TERM GOAL #4   Title  pt will use abdominal breathing (AB) in 10 minutes simple-mod complex conversation 75% of the time over two sessions    Time  5    Period  Weeks    Status  Deferred       Plan - 03/13/19 1523    Clinical Impression Statement  Pt presents today with WNL speech production (no dysarthria noted), and no overt anomia or dysnomia. Pt tested WNL on Western Aphasia Battery - Revised. Pt will be seen  for one more visit next week and agrees if everything still appears WNL that d/c is appropriate.    Speech Therapy Frequency  1x /week    Duration  --   8 weeks or 17 visits (medicaid - 10 visits)   Treatment/Interventions  Language  facilitation;Cueing hierarchy;SLP instruction and feedback;Compensatory strategies;Cognitive reorganization;Internal/external aids;Patient/family education;Functional tasks;Multimodal communcation approach;Oral motor exercises;Environmental controls    Potential to Achieve Goals  Good    Consulted and Agree with Plan of Care  Patient       Patient will benefit from skilled therapeutic intervention in order to improve the following deficits and impairments:   Aphasia  Dysarthria and anarthria    Problem List Patient Active Problem List   Diagnosis Date Noted  . Hypertensive heart disease 01/12/2019  . Ischemic stroke ([place]) 01/11/2019  . Stroke ([place]) 01/11/2019  . Hypertensive emergency 11/28/2018  . Hypertensive crisis 11/27/2018  . Obesity, Class III, BMI 40-49.9 (morbid obesity) ([place]) 11/27/2018  . Mastoiditis 08/12/2017  . Chronic kidney disease, stage 3a 08/17/2016  . Hypertension associated with diabetes ([place]) 08/06/2016  . Diabetes ([place]) 08/06/2016    [facility] ,[place], [place]  03/13/2019, 3:27 PM  [facility] [address] [place], [place], [address] Phone: [phone]   Fax:  [phone]   Name: Harold Clements MRN: [id number] Date of Birth: 16-Jan-1981

## 2019-03-15 ENCOUNTER — Ambulatory Visit: Payer: Medicaid Other

## 2019-03-16 ENCOUNTER — Other Ambulatory Visit: Payer: Self-pay

## 2019-03-16 ENCOUNTER — Encounter: Payer: Self-pay | Admitting: Neurology

## 2019-03-16 ENCOUNTER — Ambulatory Visit (INDEPENDENT_AMBULATORY_CARE_PROVIDER_SITE_OTHER): Payer: Medicaid Other | Admitting: Neurology

## 2019-03-16 VITALS — Temp 97.7°F | Ht 70.0 in | Wt 246.8 lb

## 2019-03-16 DIAGNOSIS — I639 Cerebral infarction, unspecified: Secondary | ICD-10-CM | POA: Diagnosis not present

## 2019-03-16 DIAGNOSIS — E6609 Other obesity due to excess calories: Secondary | ICD-10-CM

## 2019-03-16 DIAGNOSIS — G4733 Obstructive sleep apnea (adult) (pediatric): Secondary | ICD-10-CM

## 2019-03-16 NOTE — Patient Instructions (Addendum)
I had a [place] d/w patient about his recent cryptogenic stroke, risk for recurrent stroke/TIAs, personally independently reviewed imaging studies and stroke evaluation results and answered questions.Continue aspirin 81 mg daily  for secondary stroke prevention and maintain strict control of hypertension with blood pressure goal below 130/90, diabetes with hemoglobin A1c goal below 6.5% and lipids with LDL cholesterol goal below 70 mg/dL. I also advised the patient to eat a healthy diet with plenty of whole grains, cereals, fruits and vegetables, exercise regularly and maintain ideal body weight .check polysomnogram for sleep apnea.  Patient may also consider possible participation in the [place] trial for stroke prevention if interested.  Followup in the future with my nurse practitioner Janett Billow in 3 months or call earlier if necessary.  Stroke Prevention Some medical conditions and behaviors are associated with a higher chance of having a stroke. You can help prevent a stroke by making nutrition, lifestyle, and other changes, including managing any medical conditions you may have. What nutrition changes can be made?   Eat healthy foods. You can do this by: ? Choosing foods high in fiber, such as fresh fruits and vegetables and whole grains. ? Eating at least 5 or more servings of fruits and vegetables a day. Try to fill half of your plate at each meal with fruits and vegetables. ? Choosing lean protein foods, such as lean cuts of meat, poultry without skin, fish, tofu, beans, and nuts. ? Eating low-fat dairy products. ? Avoiding foods that are high in salt (sodium). This can help lower blood pressure. ? Avoiding foods that have saturated fat, trans fat, and cholesterol. This can help prevent high cholesterol. ? Avoiding processed and premade foods.  Follow your health care provider's specific guidelines for losing weight, controlling high blood pressure (hypertension), lowering high cholesterol, and  managing diabetes. These may include: ? Reducing your daily calorie intake. ? Limiting your daily sodium intake to 1,500 milligrams (mg). ? Using only healthy fats for cooking, such as olive oil, canola oil, or sunflower oil. ? Counting your daily carbohydrate intake. What lifestyle changes can be made?  Maintain a healthy weight. Talk to your health care provider about your ideal weight.  Get at least 30 minutes of moderate physical activity at least 5 days a week. Moderate activity includes brisk walking, biking, and swimming.  Do not use any products that contain nicotine or tobacco, such as cigarettes and e-cigarettes. If you need help quitting, ask your health care provider. It may also be helpful to avoid exposure to secondhand smoke.  Limit alcohol intake to no more than 1 drink a day for nonpregnant women and 2 drinks a day for men. One drink equals 12 oz of beer, 5 oz of wine, or 1 oz of hard liquor.  Stop any illegal drug use.  Avoid taking birth control pills. Talk to your health care provider about the risks of taking birth control pills if: ? You are over 80 years old. ? You smoke. ? You get migraines. ? You have ever had a blood clot. What other changes can be made?  Manage your cholesterol levels. ? Eating a healthy diet is important for preventing high cholesterol. If cholesterol cannot be managed through diet alone, you may also need to take medicines. ? Take any prescribed medicines to control your cholesterol as told by your health care provider.  Manage your diabetes. ? Eating a healthy diet and exercising regularly are important parts of managing your blood sugar. If your blood sugar  cannot be managed through diet and exercise, you may need to take medicines. ? Take any prescribed medicines to control your diabetes as told by your health care provider.  Control your hypertension. ? To reduce your risk of stroke, try to keep your blood pressure below  130/80. ? Eating a healthy diet and exercising regularly are an important part of controlling your blood pressure. If your blood pressure cannot be managed through diet and exercise, you may need to take medicines. ? Take any prescribed medicines to control hypertension as told by your health care provider. ? Ask your health care provider if you should monitor your blood pressure at home. ? Have your blood pressure checked every year, even if your blood pressure is normal. Blood pressure increases with age and some medical conditions.  Get evaluated for sleep disorders (sleep apnea). Talk to your health care provider about getting a sleep evaluation if you snore a lot or have excessive sleepiness.  Take over-the-counter and prescription medicines only as told by your health care provider. Aspirin or blood thinners (antiplatelets or anticoagulants) may be recommended to reduce your risk of forming blood clots that can lead to stroke.  Make sure that any other medical conditions you have, such as atrial fibrillation or atherosclerosis, are managed. What are the warning signs of a stroke? The warning signs of a stroke can be easily remembered as BEFAST.  B is for balance. Signs include: ? Dizziness. ? Loss of balance or coordination. ? Sudden trouble walking.  E is for eyes. Signs include: ? A sudden change in vision. ? Trouble seeing.  F is for face. Signs include: ? Sudden weakness or numbness of the face. ? The face or eyelid drooping to one side.  A is for arms. Signs include: ? Sudden weakness or numbness of the arm, usually on one side of the body.  S is for speech. Signs include: ? Trouble speaking (aphasia). ? Trouble understanding.  T is for time. ? These symptoms may represent a serious problem that is an emergency. Do not wait to see if the symptoms will go away. Get medical help right away. Call your local emergency services (911 in the [place] Do not drive yourself to the  hospital.  Other signs of stroke may include: ? A sudden, severe headache with no known cause. ? Nausea or vomiting. ? Seizure. Where to find more information For more information, visit:  American Stroke Association: www.strokeassociation.org  National Stroke Association: www.stroke.org Summary  You can prevent a stroke by eating healthy, exercising, not smoking, limiting alcohol intake, and managing any medical conditions you may have.  Do not use any products that contain nicotine or tobacco, such as cigarettes and e-cigarettes. If you need help quitting, ask your health care provider. It may also be helpful to avoid exposure to secondhand smoke.  Remember BEFAST for warning signs of stroke. Get help right away if you or a loved one has any of these signs. This information is not intended to replace advice given to you by your health care provider. Make sure you discuss any questions you have with your health care provider. Document Revised: 01/29/2017 Document Reviewed: 03/24/2016 Elsevier Patient Education  2020 [employer].

## 2019-03-16 NOTE — Progress Notes (Signed)
[facility] [address] [place]. [place] [address] [phone]       OFFICE FOLLOW-UP NOTE  [name] Date of Birth:  [birth year] Medical Record Number:  [id number]   HPI: Harold Clements is a 39 year old African-American male seen today for initial office follow-up visit from hospital admission for stroke in November 2020.  History is obtained from the patient, review of electronic medical records and have personally reviewed imaging films in PACS.  Harold Clements is a 39 year old African-American male with past medical history of diabetes, hypertension, chronic kidney disease stage II who woke up on 01/11/2019 with left facial droop and left-sided weakness.  His symptoms resolved by the time he reached the emergency room.  CT scan of the head showed low-density in the right basal ganglia and left medial basal ganglia possibly old strokes.  MRA of the head and neck revealed no large vessel intracranial or intraextracranial stenosis.  MRI scan of the brain confirmed right basal ganglia white matter infarcts with the patchy large enough to worry about embolus.  Carotid ultrasound showed no significant extracranial stenosis.  2D echo showed ejection fraction of 40 to 45% but it was stable compared to previous echo.  Transcranial Doppler bubble study was negative for right-to-left shunt.  EEG showed no seizure activity.  TEE was obtained which showed no cardiac source of embolism PFO or clots.  LDL cholesterol was borderline at 78 mg percent and hemoglobin A1c was elevated at 8.8.  ESR was 22 mm.Sickle cell screen was negative. antiphospholipid antibodies were negative.  ANA was negative.  Homocystine was marginally elevated at 16.1.  There was discussion about considering loop recorder but given his young age of 39 it was not done.  Patient states she is done well since discharge has made a good recovery.  Very rarely does he find himself staggering and being off balance but has had no falls  or injuries.  He is tolerating aspirin well without bruising or bleeding.  He states his blood pressure is under good control.  Is tolerating Lipitor well without muscle aches and pains.  His sugars are doing better and fasting sugars now ranging in the 90s.  Patient has never been evaluated for sleep apnea but does admit to snoring.  ROS:   14 system review of systems is positive for sleepiness, drowsiness, decreased stamina, occasional imbalance and gait difficulty all other systems negative  PMH:  Past Medical History:  Diagnosis Date  . 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 Diabetes mellitus without complication ([place])   . Ear pain 07/2017  . Hypertension   . Stroke ([place])   . Wasp sting 08/04/2016   of dorsum of left hand near thumb    Social History:  Social History   Socioeconomic History  . Marital status: Married    Spouse name: Not on file  . Number of children: Not on file  . Years of education: Not on file  . Highest education level: Not on file  Occupational History  . Occupation: Engineer, agricultural  Tobacco Use  . Smoking status: Former Smoker    Years: 4.00    Types: Cigars    Quit date: 2010    Years since quitting: 11.0  . Smokeless tobacco: Never Used  Substance and Sexual Activity  . Alcohol use: No  . Drug use: No  . Sexual activity: Yes  Other Topics Concern  . Not on file  Social History Narrative  .  Not on file   Social Determinants of Health   Financial Resource Strain:   . Difficulty of Paying Living Expenses: Not on file  Food Insecurity:   . Worried About Charity fundraiser in the Last Year: Not on file  . Ran Out of Food in the Last Year: Not on file  Transportation Needs:   . Lack of Transportation (Medical): Not on file  . Lack of Transportation (Non-Medical): Not on file  Physical Activity:   . Days of Exercise per Week: Not on file  . Minutes of Exercise per Session: Not on file  Stress:   . Feeling of  Stress : Not on file  Social Connections:   . Frequency of Communication with Friends and Family: Not on file  . Frequency of Social Gatherings with Friends and Family: Not on file  . Attends Religious Services: Not on file  . Active Member of Clubs or Organizations: Not on file  . Attends Archivist Meetings: Not on file  . Marital Status: Not on file  Intimate Partner Violence:   . Fear of Current or Ex-Partner: Not on file  . Emotionally Abused: Not on file  . Physically Abused: Not on file  . Sexually Abused: Not on file    Medications:   Current Outpatient Medications on File Prior to Visit  Medication Sig Dispense Refill  . amLODipine (NORVASC) 10 MG tablet Take 1 tablet (10 mg total) by mouth daily. 90 tablet 1  . ASPIRIN 81 PO Take by mouth.    Marland Kitchen atorvastatin (LIPITOR) 80 MG tablet Take 1 tablet (80 mg total) by mouth daily at 6 PM. 90 tablet 0  . carvedilol (COREG) 25 MG tablet Take 1 tablet (25 mg total) by mouth 2 (two) times daily with a meal. 60 tablet 1  . furosemide (LASIX) 40 MG tablet Take 1 tablet (40 mg total) by mouth daily. 30 tablet 0  . Insulin Isophane & Regular Human (NOVOLIN 70/30 FLEXPEN) (70-30) 100 UNIT/ML PEN Inject 26 Units into the skin 2 (two) times daily. 15 mL 11  . METFORMIN HCL PO Take 1,000 mg by mouth 2 (two) times daily.    . methocarbamol (ROBAXIN) 500 MG tablet Take 1 tablet (500 mg total) by mouth 4 (four) times daily as needed for muscle spasms. 20 tablet 0  . minoxidil (LONITEN) 10 MG tablet Take 1 tablet (10 mg total) by mouth 2 (two) times daily. 60 tablet 1  . potassium chloride (KLOR-CON) 20 MEQ packet Take 20 mEq by mouth daily. 90 packet 1   No current facility-administered medications on file prior to visit.    Allergies:   Allergies  Allergen Reactions  . Zithromax [Azithromycin Dihydrate] Anaphylaxis  . Kiwi Extract Other (See Comments)    Tongue goes numb    Physical Exam General: mildly obese young  African-American male, seated, in no evident distress Head: head normocephalic and atraumatic.  Neck: supple with no carotid or supraclavicular bruits Cardiovascular: regular rate and rhythm, no murmurs Musculoskeletal: no deformity Skin:  no rash/petichiae Vascular:  Normal pulses all extremities Vitals:   03/16/19 1000  Temp: 97.7 F (36.5 C)   Neurologic Exam Mental Status: Awake and fully alert. Oriented to place and time. Recent and remote memory intact. Attention span, concentration and fund of knowledge appropriate. Mood and affect appropriate.  Cranial Nerves: Fundoscopic exam reveals sharp disc margins. Pupils equal, briskly reactive to light. Extraocular movements full without nystagmus. Visual fields full to confrontation. Hearing  intact. Facial sensation intact. Face, tongue, palate moves normally and symmetrically.  Motor: Normal bulk and tone. Normal strength in all tested extremity muscles. Sensory.: intact to touch ,pinprick .position and vibratory sensation.  Coordination: Rapid alternating movements normal in all extremities. Finger-to-nose and heel-to-shin performed accurately bilaterally. Gait and Station: Arises from chair without difficulty. Stance is normal. Gait demonstrates normal stride length and balance . Able to heel, toe and tandem walk with slight difficulty.  Reflexes: 1+ and symmetric. Toes downgoing.   NIHSS  0 Modified Rankin  1   ASSESSMENT: 39 year old African-American male with right brain large patchy subcortical infarcts likely of cryptogenic etiology in November 2020 from which he is doing well with an excellent physical recovery.  Vascular risk factors of diabetes, hypertension, hyperlipidemia, obesity and suspected sleep apnea     PLAN: I had a [place] d/w patient about his recent cryptogenic stroke, risk for recurrent stroke/TIAs, personally independently reviewed imaging studies and stroke evaluation results and answered questions.Continue  aspirin 81 mg daily  for secondary stroke prevention and maintain strict control of hypertension with blood pressure goal below 130/90, diabetes with hemoglobin A1c goal below 6.5% and lipids with LDL cholesterol goal below 70 mg/dL. I also advised the patient to eat a healthy diet with plenty of whole grains, cereals, fruits and vegetables, exercise regularly and maintain ideal body weight .check polysomnogram for sleep apnea.  Patient may also consider possible participation in the [place] trial for stroke prevention if interested.  Followup in the future with my nurse practitioner Janett Billow in 3 months or call earlier if necessary. Greater than 50% of time during this 25 minute visit was spent on counseling,explanation of diagnosis, planning of further management, discussion with patient and family and coordination of care Antony Contras, MD  [facility] [address] [place], [place] [address]  Phone [phone] Fax [phone] Note: This document was prepared with digital dictation and possible smart phrase technology. Any transcriptional errors that result from this process are unintentional

## 2019-03-17 ENCOUNTER — Encounter: Payer: Self-pay | Admitting: Cardiovascular Disease

## 2019-03-17 ENCOUNTER — Telehealth: Payer: Self-pay | Admitting: *Deleted

## 2019-03-17 ENCOUNTER — Telehealth (INDEPENDENT_AMBULATORY_CARE_PROVIDER_SITE_OTHER): Payer: Medicaid Other | Admitting: Cardiovascular Disease

## 2019-03-17 DIAGNOSIS — G4733 Obstructive sleep apnea (adult) (pediatric): Secondary | ICD-10-CM

## 2019-03-17 DIAGNOSIS — N1831 Chronic kidney disease, stage 3a: Secondary | ICD-10-CM

## 2019-03-17 DIAGNOSIS — I639 Cerebral infarction, unspecified: Secondary | ICD-10-CM | POA: Diagnosis not present

## 2019-03-17 NOTE — Patient Instructions (Signed)
Medication Instructions:  No changes *If you need a refill on your cardiac medications before your next appointment, please call your pharmacy*  Lab Work: None ordered If you have labs (blood work) drawn today and your tests are completely normal, you will receive your results only by: Marland Kitchen MyChart Message (if you have MyChart) OR . A paper copy in the mail If you have any lab test that is abnormal or we need to change your treatment, we will call you to review the results.  Testing/Procedures: None ordered  Follow-Up: At [facility], you and your health needs are our priority.  As part of our continuing mission to provide you with exceptional heart care, we have created designated Provider Care Teams.  These Care Teams include your primary Cardiologist (physician) and Advanced Practice Providers (APPs -  Physician Assistants and Nurse Practitioners) who all work together to provide you with the care you need, when you need it.  Your next appointment:   3 month(s)  The format for your next appointment:   In Person  Provider:   You may see Sanda Klein, MD or one of the following Advanced Practice Providers on your designated Care Team:    Almyra Deforest, PA-C  Fabian Sharp, [place] or   Roby Lofts, [place]   Other Instructions Dr. Sallyanne Kuster would like you to check your blood pressure DAILY.  Keep a journal of these daily BP and heart rate readings and call our office or send a message through [facility] with the results.

## 2019-03-17 NOTE — Progress Notes (Signed)
Virtual Visit via Telephone Note   This visit type was conducted due to national recommendations for restrictions regarding the COVID-19 Pandemic (e.g. social distancing) in an effort to limit this patient's exposure and mitigate transmission in our community.  Due to his co-morbid illnesses, this patient is at least at moderate risk for complications without adequate follow up.  This format is felt to be most appropriate for this patient at this time.  The patient did not have access to video technology/had technical difficulties with video requiring transitioning to audio format only (telephone).  All issues noted in this document were discussed and addressed.  No physical exam could be performed with this format.  Please refer to the patient's chart for his  consent to telehealth for [facility].   Date:  03/17/2019   ID:  Harold Clements, DOB [birth year], MRN [id number]  Patient Location: Home Provider Location: Home  PCP:  Kerin Perna, NP  Cardiologist:  Sanda Klein, MD  Electrophysiologist:  None   Evaluation Performed:  Follow-Up Visit  Chief Complaint: Malignant hypertension  History of Present Illness:    Harold Clements is a 39 y.o. male with malignant hypertension, chronic diastolic heart failure, CKD stage III, diabetes mellitus type 2, OSA with incomplete compliance with CPAP, morbid obesity and recent stroke.  We have been gradually escalating his antihypertensive medications and had to add minoxidil.  To date, he has not had problems with edema, hirsutism or pericarditis.  At his last visit on December 1 his blood pressure was well controlled.  He was having some trouble with his blood pressure monitor today and I am not sure that the report of blood pressure was accurate.  His creatinine has improved and most recently was 1.8-1.9 (we have been therefore reluctant to add RAAS inhibitors).  It seems he has recovered reasonably well from the stroke he experienced on  November 11 (acute right basal ganglia hypodensity on CT, transient left-sided weakness and confusion).  He has not had any new neurological events.  TEE performed on February 02, 2019 showed mildly decreased LVEF 45-50% with moderate LVH, normal left atrial size and no significant valvular abnormalities.  Saline contrast study was negative for shunt.  The patient does not have symptoms concerning for COVID-19 infection (fever, chills, cough, or new shortness of breath).    Past Medical History:  Diagnosis Date  . 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 Diabetes mellitus without complication ([place])   . Ear pain 07/2017  . Hypertension   . Stroke ([place])   . Wasp sting 08/04/2016   of dorsum of left hand near thumb   Past Surgical History:  Procedure Laterality Date  . BUBBLE STUDY  02/02/2019   Procedure: BUBBLE STUDY;  Surgeon: Lelon Perla, MD;  Location: [facility] ENDOSCOPY;  Service: Cardiovascular;;  . TEE WITHOUT CARDIOVERSION N/A 02/02/2019   Procedure: TRANSESOPHAGEAL ECHOCARDIOGRAM (TEE);  Surgeon: Lelon Perla, MD;  Location: [facility] ENDOSCOPY;  Service: Cardiovascular;  Laterality: N/A;     Current Meds  Medication Sig  . ASPIRIN 81 PO Take by mouth.  Marland Kitchen atorvastatin (LIPITOR) 80 MG tablet Take 1 tablet (80 mg total) by mouth daily at 6 PM.  . Insulin Isophane & Regular Human (NOVOLIN 70/30 FLEXPEN) (70-30) 100 UNIT/ML PEN Inject 26 Units into the skin 2 (two) times daily.  Marland Kitchen METFORMIN HCL PO Take 1,000 mg by mouth 2 (two) times daily.  . methocarbamol (ROBAXIN) 500 MG tablet  Take 1 tablet (500 mg total) by mouth 4 (four) times daily as needed for muscle spasms.     Allergies:   Zithromax [azithromycin dihydrate] and Kiwi extract   Social History   Tobacco Use  . Smoking status: Former Smoker    Years: 4.00    Types: Cigars    Quit date: 2010    Years since quitting: 11.0  . Smokeless tobacco: Never Used  Substance Use Topics  . Alcohol use: No  .  Drug use: No     Family Hx: The patient's family history includes Diabetes in his mother; Heart disease in his mother; Hypertension in his father and mother; Stroke in his mother.  ROS:   Please see the history of present illness.     All other systems reviewed and are negative.   Prior CV studies:   The following studies were reviewed today: TEE 2019-02-12  1. Left ventricular ejection fraction, by visual estimation, is 45 to 50%. The left ventricle has mildly decreased function. There is moderately increased left ventricular hypertrophy.  2. Global right ventricle has normal systolic function.The right ventricular size is normal.  3. Left atrial size was normal.  4. Right atrial size was normal.  5. Small pericardial effusion.  6. The mitral valve is normal in structure. Trace mitral valve regurgitation.  7. The tricuspid valve is normal in structure. Tricuspid valve regurgitation is trivial.  8. The aortic valve is tricuspid. Aortic valve regurgitation is trivial.  9. The pulmonic valve was grossly normal. Pulmonic valve regurgitation is not visualized. 10. Mild plaque invoving the descending aorta. 11. Mild global reduction in LV systolic function; LVH; trace AI, MR and TR; small pericardial effusion; negative saline microcavitation study.  Labs/Other Tests and Data Reviewed:    EKG:  An ECG dated 01/23/2019 was personally reviewed today and demonstrated:  Sinus rhythm, inferior Q waves, probable LVH masked by obesity, no ischemic repolarization abnormalities  Recent Labs: 11/27/2018: B Natriuretic Peptide 402.3 01/11/2019: TSH 2.986 01/22/2019: ALT 12 01/24/2019: BUN 17; Creatinine, Ser 1.88; Hemoglobin 14.2; Platelets 271; Potassium 3.6; Sodium 139   Recent Lipid Panel Lab Results  Component Value Date/Time   CHOL 134 01/12/2019 03:39 AM   TRIG 118 01/12/2019 03:39 AM   HDL 32 (L) 01/12/2019 03:39 AM   CHOLHDL 4.2 01/12/2019 03:39 AM   LDLCALC 78 01/12/2019 03:39 AM     Wt Readings from Last 3 Encounters:  03/17/19 246 lb (111.6 kg)  03/16/19 246 lb 12.8 oz (111.9 kg)  02/08/19 262 lb (118.8 kg)     Objective:    Vital Signs:  Ht 5\' 9"  (1.753 m)   Wt 246 lb (111.6 kg)   BMI 36.33 kg/m   He reported blood pressure 137/109 and heart rate 66.  Reading did not appear reliable or reproducible.  Some issues with blood pressure cuff.  VITAL SIGNS:  reviewed Unable to examine  ASSESSMENT & PLAN:    1. HTN: At his last appointment his blood pressure was very well controlled and I am not sure today's reading is reliable.  I asked him to try to get new equipment, check his blood pressure daily and we will check back with him in the middle of next week to see if there is need for additional titration of blood pressure meds. 2. CHF: Despite mildly depressed left ventricular systolic function she does not have signs or symptoms of heart failure.  Suspect will improve point consistently good blood pressure control.  EF  reported on TEE was slightly better than on the transthoracic echo performed 1 month earlier. 3. OSA: Advised 100% compliance with CPAP. 4. HLP: LDL cholesterol close to target on most recent labs last month.  COVID-19 Education: The signs and symptoms of COVID-19 were discussed with the patient and how to seek care for testing (follow up with PCP or arrange E-visit).  The importance of social distancing was discussed today.  Time:   Today, I have spent 14 minutes with the patient with telehealth technology discussing the above problems.     Medication Adjustments/Labs and Tests Ordered: Current medicines are reviewed at length with the patient today.  Concerns regarding medicines are outlined above.   Tests Ordered: No orders of the defined types were placed in this encounter.   Medication Changes: No orders of the defined types were placed in this encounter.   Follow Up:  In Person 3 months  Signed, Sanda Klein, MD  03/17/2019  9:21 AM    [place]

## 2019-03-17 NOTE — Telephone Encounter (Signed)
Virtual Visit Pre-Appointment Phone Call  "(Name), I am calling you today to discuss your upcoming appointment. We are currently trying to limit exposure to the virus that causes COVID-19 by seeing patients at home rather than in the office."  1. "What is the BEST phone number to call the day of the visit?" - include this in appointment notes  2. "Do you have or have access to (through a family member/friend) a smartphone with video capability that we can use for your visit?" a. If yes - list this number in appt notes as "cell" (if different from BEST phone #) and list the appointment type as a VIDEO visit in appointment notes b. If no - list the appointment type as a PHONE visit in appointment notes  3. Confirm consent - "In the setting of the current Covid19 crisis, you are scheduled for a (phone or video) visit with your provider on (date) at (time).  Just as we do with many in-office visits, in order for you to participate in this visit, we must obtain consent.  If you'd like, I can send this to your mychart (if signed up) or email for you to review.  Otherwise, I can obtain your verbal consent now.  All virtual visits are billed to your insurance company just like a normal visit would be.  By agreeing to a virtual visit, we'd like you to understand that the technology does not allow for your provider to perform an examination, and thus may limit your provider's ability to fully assess your condition. If your provider identifies any concerns that need to be evaluated in person, we will make arrangements to do so.  Finally, though the technology is pretty good, we cannot assure that it will always work on either your or our end, and in the setting of a video visit, we may have to convert it to a phone-only visit.  In either situation, we cannot ensure that we have a secure connection.  Are you willing to proceed?" Yes  4. Advise patient to be prepared - "Two hours prior to your appointment, go  ahead and check your blood pressure, pulse, oxygen saturation, and your weight (if you have the equipment to check those) and write them all down. When your visit starts, your provider will ask you for this information. If you have an Apple Watch or Kardia device, please plan to have heart rate information ready on the day of your appointment. Please have a pen and paper handy nearby the day of the visit as well."  5. Give patient instructions for MyChart download to smartphone OR Doximity/Doxy.me as below if video visit (depending on what platform provider is using)  6. Inform patient they will receive a phone call 15 minutes prior to their appointment time (may be from unknown caller ID) so they should be prepared to answer    TELEPHONE CALL NOTE  Harold Clements has been deemed a candidate for a follow-up tele-health visit to limit community exposure during the Covid-19 pandemic. I spoke with the patient via phone to ensure availability of phone/video source, confirm preferred email & phone number, and discuss instructions and expectations.  I reminded Harold Clements to be prepared with any vital sign and/or heart rhythm information that could potentially be obtained via home monitoring, at the time of his visit. I reminded Harold Clements to expect a phone call prior to his visit.  Harold Barker, RN 03/17/2019 9:34 AM   INSTRUCTIONS  FOR DOWNLOADING THE MYCHART APP TO SMARTPHONE  - The patient must first make sure to have activated MyChart and know their login information - If Apple, go to [employer] and type in MyChart in the search bar and download the app. If Android, ask patient to go to [employer] and type in [place] in the search bar and download the app. The app is free but as with any other app downloads, their phone may require them to verify saved payment information or Apple/Android password.  - The patient will need to then log into the app with their MyChart username and  password, and select [facility] as their healthcare provider to link the account. When it is time for your visit, go to the MyChart app, find appointments, and click Begin Video Visit. Be sure to Select Allow for your device to access the Microphone and Camera for your visit. You will then be connected, and your provider will be with you shortly.  **If they have any issues connecting, or need assistance please contact MyChart service desk [phone])**  **If using a computer, in order to ensure the best quality for their visit they will need to use either of the following Internet Browsers: [employer], or Google Chrome**  IF USING DOXIMITY or DOXY.ME - The patient will receive a link just prior to their visit by text.     FULL LENGTH CONSENT FOR TELE-HEALTH VISIT   I hereby voluntarily request, consent and authorize [facility] and its employed or contracted physicians, physician assistants, nurse practitioners or other licensed health care professionals (the Practitioner), to provide me with telemedicine health care services (the "Services") as deemed necessary by the treating Practitioner. I acknowledge and consent to receive the Services by the Practitioner via telemedicine. I understand that the telemedicine visit will involve communicating with the Practitioner through live audiovisual communication technology and the disclosure of certain medical information by electronic transmission. I acknowledge that I have been given the opportunity to request an in-person assessment or other available alternative prior to the telemedicine visit and am voluntarily participating in the telemedicine visit.  I understand that I have the right to withhold or withdraw my consent to the use of telemedicine in the course of my care at any time, without affecting my right to future care or treatment, and that the Practitioner or I may terminate the telemedicine visit at any time. I  understand that I have the right to inspect all information obtained and/or recorded in the course of the telemedicine visit and may receive copies of available information for a reasonable fee.  I understand that some of the potential risks of receiving the Services via telemedicine include:  Marland Kitchen Delay or interruption in medical evaluation due to technological equipment failure or disruption; . Information transmitted may not be sufficient (e.g. poor resolution of images) to allow for appropriate medical decision making by the Practitioner; and/or  . In rare instances, security protocols could fail, causing a breach of personal health information.  Furthermore, I acknowledge that it is my responsibility to provide information about my medical history, conditions and care that is complete and accurate to the best of my ability. I acknowledge that Practitioner's advice, recommendations, and/or decision may be based on factors not within their control, such as incomplete or inaccurate data provided by me or distortions of diagnostic images or specimens that may result from electronic transmissions. I understand that the practice of medicine is not an exact science and  that Practitioner makes no warranties or guarantees regarding treatment outcomes. I acknowledge that I will receive a copy of this consent concurrently upon execution via email to the email address I last provided but may also request a printed copy by calling the office of [facility].    I understand that my insurance will be billed for this visit.   I have read or had this consent read to me. . I understand the contents of this consent, which adequately explains the benefits and risks of the Services being provided via telemedicine.  . I have been provided ample opportunity to ask questions regarding this consent and the Services and have had my questions answered to my satisfaction. . I give my informed consent for the services to be  provided through the use of telemedicine in my medical care  By participating in this telemedicine visit I agree to the above.

## 2019-03-18 ENCOUNTER — Other Ambulatory Visit (HOSPITAL_COMMUNITY): Payer: Medicaid Other

## 2019-03-21 ENCOUNTER — Ambulatory Visit: Payer: Medicaid Other

## 2019-03-21 ENCOUNTER — Encounter (HOSPITAL_BASED_OUTPATIENT_CLINIC_OR_DEPARTMENT_OTHER): Payer: Medicaid Other | Admitting: Cardiovascular Disease

## 2019-03-22 ENCOUNTER — Other Ambulatory Visit: Payer: Self-pay | Admitting: Internal Medicine

## 2019-03-22 ENCOUNTER — Ambulatory Visit: Payer: Medicaid Other | Admitting: Physical Therapy

## 2019-03-22 DIAGNOSIS — E118 Type 2 diabetes mellitus with unspecified complications: Secondary | ICD-10-CM

## 2019-03-23 ENCOUNTER — Ambulatory Visit: Payer: Medicaid Other | Attending: Internal Medicine

## 2019-03-23 ENCOUNTER — Encounter: Payer: Medicaid Other | Admitting: Speech Pathology

## 2019-03-23 ENCOUNTER — Ambulatory Visit: Payer: Medicaid Other | Admitting: Physical Therapy

## 2019-03-23 DIAGNOSIS — Z20822 Contact with and (suspected) exposure to covid-19: Secondary | ICD-10-CM | POA: Diagnosis not present

## 2019-03-24 LAB — NOVEL CORONAVIRUS, NAA: SARS-CoV-2, NAA: NOT DETECTED

## 2019-03-25 ENCOUNTER — Other Ambulatory Visit (HOSPITAL_COMMUNITY): Payer: Medicaid Other

## 2019-03-27 ENCOUNTER — Ambulatory Visit: Payer: Medicaid Other

## 2019-03-28 ENCOUNTER — Ambulatory Visit (HOSPITAL_BASED_OUTPATIENT_CLINIC_OR_DEPARTMENT_OTHER): Payer: Medicaid Other | Attending: Cardiovascular Disease | Admitting: Cardiovascular Disease

## 2019-03-28 ENCOUNTER — Other Ambulatory Visit: Payer: Self-pay

## 2019-03-28 DIAGNOSIS — R0683 Snoring: Secondary | ICD-10-CM | POA: Diagnosis not present

## 2019-03-28 DIAGNOSIS — G4733 Obstructive sleep apnea (adult) (pediatric): Secondary | ICD-10-CM | POA: Insufficient documentation

## 2019-03-28 DIAGNOSIS — I152 Hypertension secondary to endocrine disorders: Secondary | ICD-10-CM

## 2019-03-28 DIAGNOSIS — I10 Essential (primary) hypertension: Secondary | ICD-10-CM | POA: Insufficient documentation

## 2019-03-28 DIAGNOSIS — E1159 Type 2 diabetes mellitus with other circulatory complications: Secondary | ICD-10-CM | POA: Diagnosis not present

## 2019-03-28 DIAGNOSIS — Z6835 Body mass index (BMI) 35.0-35.9, adult: Secondary | ICD-10-CM | POA: Diagnosis not present

## 2019-03-30 ENCOUNTER — Ambulatory Visit: Payer: Medicaid Other | Admitting: Physical Therapy

## 2019-03-30 ENCOUNTER — Encounter: Payer: Self-pay | Admitting: *Deleted

## 2019-03-30 ENCOUNTER — Other Ambulatory Visit: Payer: Self-pay

## 2019-03-30 ENCOUNTER — Ambulatory Visit: Payer: Medicaid Other

## 2019-03-30 DIAGNOSIS — R4701 Aphasia: Secondary | ICD-10-CM | POA: Diagnosis not present

## 2019-03-30 NOTE — Therapy (Signed)
[facility] [address] [place], [place], [address] Phone: [phone]   Fax:  [phone]  Speech Language Pathology Treatment/Discharge summary  Patient Details  Name: Harold Clements MRN: [id number] Date of Birth: 1980-10-18 Referring Provider (SLP): Juluis Mire, NP   Encounter Date: 03/30/2019  End of Session - 03/30/19 1209    Visit Number  3    Number of Visits  11    Date for SLP Re-Evaluation  04/26/19    Authorization Type  Medicaid    Authorization Time Period  03-29-19    Authorization - Visit Number  3    Authorization - Number of Visits  3    SLP Start Time  6333    SLP Stop Time   1200    SLP Time Calculation (min)  13 min    Activity Tolerance  Patient tolerated treatment well       Past Medical History:  Diagnosis Date  . 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 Diabetes mellitus without complication ([place])   . Ear pain 07/2017  . Hypertension   . Stroke ([place])   . Wasp sting 08/04/2016   of dorsum of left hand near thumb    Past Surgical History:  Procedure Laterality Date  . BUBBLE STUDY  02/02/2019   Procedure: BUBBLE STUDY;  Surgeon: Lelon Perla, MD;  Location: [facility] ENDOSCOPY;  Service: Cardiovascular;;  . TEE WITHOUT CARDIOVERSION N/A 02/02/2019   Procedure: TRANSESOPHAGEAL ECHOCARDIOGRAM (TEE);  Surgeon: Lelon Perla, MD;  Location: [facility] ENDOSCOPY;  Service: Cardiovascular;  Laterality: N/A;    There were no vitals filed for this visit.  Subjective Assessment - 03/30/19 1207    Subjective  "Everything is good!"    Currently in Pain?  No/denies            ADULT SLP TREATMENT - 03/30/19 1207      General Information   Behavior/Cognition  Alert;Cooperative;Pleasant mood      Treatment Provided   Treatment provided  Cognitive-Linquistic      Cognitive-Linquistic Treatment   Treatment focused on  Aphasia    Skilled Treatment  Pt reports his  talking is going as well previous session - baseline. SLP and pt walked around the building and outdoors and pt language informally judged as WNL, he agrees.SLP asked pt for comments or questions and he had none.       Assessment / Recommendations / Plan   Plan  Discharge SLP treatment due to (comment)   baseline languge skills     Progression Toward Goals   Progression toward goals  Goals met, education completed, patient discharged from [facility] [place] - 03/30/19 1211      SLP SHORT TERM GOAL #1   Title  pt will demo HEP for dysarthria with rare min A x2 sessions    Baseline  not provided yet    Time  3    Period  Weeks   or 7 total visits   Status  Deferred      SLP SHORT TERM GOAL #2   Title  pt will improve expressive langauage by completing simple functional naming tasks with 80% success over two sessions    Baseline  20% success    Time  3    Period  Weeks    Status  Deferred      SLP SHORT TERM GOAL #3  Title  pt will report people do not ask him to repeat his message during the day    Baseline  he is asked to repeat multiple times per day    Status  Achieved      SLP SHORT TERM GOAL #4   Title  pt will communicate in 10 minutes simple-mod complex conversation with 95% of natural speech rate over two sessions    Baseline  ~35% natural speech; 03-13-19, 03-29-19    Status  Partially Met      SLP SHORT TERM GOAL #5   Title  pt will use abdominal breathing (AB) in sentence responses 80% of the time    Time  3    Period  Weeks    Status  Deferred       SLP [place] Term Goals - 03/30/19 1211      SLP [place] TERM GOAL #1   Title  pt will functionally participate in10 minutes mod complex conversation using language compensations for 90% natural speech rate    Baseline  ~35% natural speech rate simple-mod complex    Time  5    Period  Weeks   or 11 total visits, for all LTGs   Status  Deferred      SLP [place] TERM GOAL #2   Title  pt will report  request for repeats no more than one every other day between 2 sessions    Baseline  every day request for repeat    Time  5    Period  Weeks    Status  Deferred      SLP [place] TERM GOAL #3   Title  pt will experience anomic episodes <1 each day x 3 sessions    Baseline  x2/day; 03-13-19, 03-29-19    Status  Partially Met      SLP [place] TERM GOAL #4   Title  pt will use abdominal breathing (AB) in 10 minutes simple-mod complex conversation 75% of the time over two sessions    Time  5    Period  Weeks    Status  Deferred       Plan - 03/30/19 1210    Clinical Impression Statement  Pt presents today with WNL speech production (no dysarthria noted), and no overt anomia or dysnomia. Pt states his langauge has returned to baseline. Pt agrees that d/c is appropriate.    Treatment/Interventions  Language facilitation;Cueing hierarchy;SLP instruction and feedback;Compensatory strategies;Cognitive reorganization;Internal/external aids;Patient/family education;Functional tasks;Multimodal communcation approach;Oral motor exercises;Environmental controls    Potential to Achieve Goals  Good    Consulted and Agree with Plan of Care  Patient       Patient will benefit from skilled therapeutic intervention in order to improve the following deficits and impairments:   Aphasia   SPEECH THERAPY DISCHARGE SUMMARY  Visits from Start of Care: 3  Current functional level related to goals / functional outcomes: See goals above. Pt has returned to baseline.   Remaining deficits: None noted   Education / Equipment: Compensations for dysarthria and aphasia    Plan: Patient agrees to discharge.  Patient goals were partially met. Patient is being discharged due to being pleased with the current functional level.  ?????       Problem List Patient Active Problem List   Diagnosis Date Noted  . Hypertensive heart disease 01/12/2019  . Ischemic stroke ([place]) 01/11/2019  . Stroke ([place]) 01/11/2019  .  Hypertensive emergency 11/28/2018  . Hypertensive crisis 11/27/2018  . Obesity,  Class III, BMI 40-49.9 (morbid obesity) ([place]) 11/27/2018  . Mastoiditis 08/12/2017  . Chronic kidney disease, stage 3a 08/17/2016  . Hypertension associated with diabetes ([place]) 08/06/2016  . Diabetes ([place]) 08/06/2016    [facility] ,[place], [place]  03/30/2019, 12:12 PM  [facility] [address] [place], [place], [address] Phone: [phone]   Fax:  [phone]   Name: Harold Clements MRN: [id number] Date of Birth: Feb 24, 1981

## 2019-04-04 ENCOUNTER — Ambulatory Visit: Payer: Medicaid Other

## 2019-04-04 ENCOUNTER — Telehealth: Payer: Self-pay | Admitting: *Deleted

## 2019-04-04 NOTE — Telephone Encounter (Signed)
Patient notified of sleep study results and recommendations. He agrees to precede with starting CPAP therapy.

## 2019-04-04 NOTE — Procedures (Signed)
Patient Name: Harold Clements, Harold Clements Date: 03/28/2019 Gender: Male D.O.B: [birth year] Age (years): 38 Referring Provider: Tami Lin Duke Height (inches): 87 Interpreting Physician: Shelva Majestic MD, ABSM Weight (lbs): 262 RPSGT: Jacolyn Reedy BMI: 38 MRN: [id number] Neck Size: 19.50  CLINICAL INFORMATION Sleep Study Type: Split Night CPAP  Indication for sleep study: Excessive Daytime Sleepiness, Fatigue, Hypertension, Morning Headaches, Snoring  Epworth Sleepiness Score: 10  SLEEP STUDY TECHNIQUE As per the AASM Manual for the Scoring of Sleep and Associated Events v2.3 (April 2016) with a hypopnea requiring 4% desaturations.  The channels recorded and monitored were frontal, central and occipital EEG, electrooculogram (EOG), submentalis EMG (chin), nasal and oral airflow, thoracic and abdominal wall motion, anterior tibialis EMG, snore microphone, electrocardiogram, and pulse oximetry. Continuous positive airway pressure (CPAP) was initiated when the patient met split night criteria and was titrated according to treat sleep-disordered breathing.  MEDICATIONS amLODipine (NORVASC) 10 MG tablet(Expired)  ASPIRIN 81 PO  atorvastatin (LIPITOR) 80 MG tablet  carvedilol (COREG) 25 MG tablet(Expired)  furosemide (LASIX) 40 MG tablet(Expired)  Insulin Isophane & Regular Human (NOVOLIN 70/30 FLEXPEN) (70-30) 100 UNIT/ML PEN  METFORMIN HCL PO  methocarbamol (ROBAXIN) 500 MG tablet  minoxidil (LONITEN) 10 MG tablet(Expired)  potassium chloride (KLOR-CON) 20 MEQ packet(Expired)  Medications self-administered by patient taken the night of the study : N/A  RESPIRATORY PARAMETERS Diagnostic Total AHI (/hr): 61.0 RDI (/hr): 67.3 OA Index (/hr): 46.9 CA Index (/hr): 4.4 REM AHI (/hr): 96.0 NREM AHI (/hr): 59.5 Supine AHI (/hr): 89.4 Non-supine AHI (/hr): 18.2 Min O2 Sat (%): 85.0 Mean O2 (%): 95.5 Time below 88% (min): 0.8   Titration Optimal Pressure (cm): 13 AHI at Optimal  Pressure (/hr): 0.9 Min O2 at Optimal Pressure (%): 96.0 Supine % at Optimal (%): 100 Sleep % at Optimal (%): 99   SLEEP ARCHITECTURE The recording time for the entire night was 397.7 minutes.  During a baseline period of 173.6 minutes, the patient slept for 124.0 minutes in REM and nonREM, yielding a sleep efficiency of 71.4%%. Sleep onset after lights out was 4.6 minutes with a REM latency of 102.5 minutes. The patient spent 55.2%% of the night in stage N1 sleep, 40.7%% in stage N2 sleep, 0.0%% in stage N3 and 4% in REM.  During the titration period of 222.8 minutes, the patient slept for 211.2 minutes in REM and nonREM, yielding a sleep efficiency of 94.8%%. Sleep onset after CPAP initiation was 1.6 minutes with a REM latency of 4.5 minutes. The patient spent 12.9%% of the night in stage N1 sleep, 56.1%% in stage N2 sleep, 0.0%% in stage N3 and 31% in REM.  CARDIAC DATA The 2 lead EKG demonstrated sinus rhythm. The mean heart rate was 100.0 beats per minute. Other EKG findings include: None.  LEG MOVEMENT DATA The total Periodic Limb Movements of Sleep (PLMS) were 0. The PLMS index was 0.0 .  IMPRESSIONS - Severe obstructive sleep apnea occurred during the diagnostic portion of the study (AHI 61.0/h; RDI 67.3/h); events were worse with suopine sleep (AHI 89.4/h) and during REM sleep (AHI 96.0). CPAP was initated at 5 cm and was titrated to optimal PAP pressure at 13 cm of water. - No significant central sleep apnea occurred during the diagnostic portion of the study (CAI = 4.4/hour). - The patient had mild oxygen desaturation during the diagnostic portion of the study to a nadir  of 85.0%. - The patient snored with loud snoring volume during the diagnostic portion of the study. -  No cardiac abnormalities were noted during this study. - Clinically significant periodic limb movements did not occur during sleep.  DIAGNOSIS - Obstructive Sleep Apnea (327.23 [G47.33  ICD-10])  RECOMMENDATIONS - Recommend an initial trial of CPAP therapy with EPR at 13 cm H2O with heated humidification.  A Large size Fisher&Paykel Full Face Mask Simplus mask was used for the titration. - Effort should be made to optimize nasal and oropharyngeal patency. - The patient should be advised to try to avoid supine sleep.  - Avoid alcohol, sedatives and other CNS depressants that may worsen sleep apnea and disrupt normal sleep architecture. - Sleep hygiene should be reviewed to assess factors that may improve sleep quality. - Weight management (BMI 38) and regular exercise should be initiated or continued. - Recommend a download in 30 days and sleep clinic evaluation after 4 weeks of therapy.  [Electronically signed] 04/04/2019 08:29 AM  Shelva Majestic MD, [facility], [place], American Board of Sleep Medicine   NPI: [id number] [place] PH: [phone]   FX: [phone] [place]

## 2019-04-05 ENCOUNTER — Telehealth: Payer: Self-pay | Admitting: *Deleted

## 2019-04-05 NOTE — Telephone Encounter (Signed)
Order for CPAP sent to [facility] via [employer].

## 2019-04-05 NOTE — Telephone Encounter (Signed)
Called the patient to see if he had any blood pressure readings following his virtual visit. He stated that he did not have any specific numbers because he was out but they were doing "good." He has been advised to call back with readings when it was available.

## 2019-04-06 ENCOUNTER — Ambulatory Visit: Payer: Medicaid Other | Admitting: Speech Pathology

## 2019-04-06 ENCOUNTER — Ambulatory Visit: Payer: Medicaid Other | Admitting: Physical Therapy

## 2019-04-10 ENCOUNTER — Ambulatory Visit: Payer: Medicaid Other

## 2019-04-12 ENCOUNTER — Telehealth: Payer: Self-pay | Admitting: Physician Assistant

## 2019-04-12 ENCOUNTER — Other Ambulatory Visit: Payer: Self-pay | Admitting: Physician Assistant

## 2019-04-12 MED ORDER — FUROSEMIDE 40 MG PO TABS: 40.0000 mg | ORAL_TABLET | Freq: Every day | ORAL | 3 refills | Status: DC

## 2019-04-12 MED ORDER — MINOXIDIL 10 MG PO TABS: 10.0000 mg | ORAL_TABLET | Freq: Two times a day (BID) | ORAL | 2 refills | Status: DC

## 2019-04-12 NOTE — Telephone Encounter (Signed)
Patient also wants to know who would be ordering his cpap machine?

## 2019-04-12 NOTE — Telephone Encounter (Signed)
This is planned as a [place]-term medication for him; please renew 90-day prescription with 3 refills

## 2019-04-12 NOTE — Telephone Encounter (Signed)
Spoke with Dr. Sallyanne Kuster, patient is to be on lasix 40mg  daily [place] term. 90 day supply with 3 refills sent to pharmacy. Patient updated and verbalized understanding.

## 2019-04-12 NOTE — Telephone Encounter (Signed)
Spoke with patient. Patient finished his last dose of Lasix 2 days ago. Patient unsure if he is supposed to continue taking medication. Patient denies swelling, weight gain or shortness of breath. Lasix 40mg  was last ordered 01/20/19 with an end date of 03/16/19. Will route to Dr. Sallyanne Kuster for instructions.

## 2019-04-12 NOTE — Telephone Encounter (Signed)
New Message   Pt c/o medication issue:  1. Name of Medication: furosemide (LASIX) 40 MG tablet   2. How are you currently taking this medication (dosage and times per day)?   3. Are you having a reaction (difficulty breathing--STAT)?   4. What is your medication issue? Patient is wanting to know if he should still be taking the lasix. If so he would need a new rx.

## 2019-04-12 NOTE — Telephone Encounter (Signed)
New Message    *STAT* If patient is at the pharmacy, call can be transferred to refill team.   1. Which medications need to be refilled? (please list name of each medication and dose if known) minoxidil (LONITEN) 10 MG tablet   2. Which pharmacy/location (including street and city if local pharmacy) is medication to be sent to? [facility] [place] - [address]  3. Do they need a 30 day or 90 day supply? [place]

## 2019-04-13 ENCOUNTER — Ambulatory Visit: Payer: Medicaid Other

## 2019-04-26 ENCOUNTER — Telehealth: Payer: Self-pay | Admitting: *Deleted

## 2019-04-26 NOTE — Telephone Encounter (Signed)
Left message to return a call to me in reference to CPAP machine set up appointment.

## 2019-05-03 DIAGNOSIS — N401 Enlarged prostate with lower urinary tract symptoms: Secondary | ICD-10-CM | POA: Diagnosis not present

## 2019-05-03 DIAGNOSIS — E1122 Type 2 diabetes mellitus with diabetic chronic kidney disease: Secondary | ICD-10-CM | POA: Diagnosis not present

## 2019-05-03 DIAGNOSIS — N1831 Chronic kidney disease, stage 3a: Secondary | ICD-10-CM | POA: Diagnosis not present

## 2019-05-03 DIAGNOSIS — R809 Proteinuria, unspecified: Secondary | ICD-10-CM | POA: Diagnosis not present

## 2019-05-03 DIAGNOSIS — Z8673 Personal history of transient ischemic attack (TIA), and cerebral infarction without residual deficits: Secondary | ICD-10-CM | POA: Diagnosis not present

## 2019-05-03 DIAGNOSIS — I10 Essential (primary) hypertension: Secondary | ICD-10-CM | POA: Diagnosis not present

## 2019-05-08 DIAGNOSIS — I10 Essential (primary) hypertension: Secondary | ICD-10-CM | POA: Diagnosis not present

## 2019-05-12 DIAGNOSIS — G4733 Obstructive sleep apnea (adult) (pediatric): Secondary | ICD-10-CM | POA: Diagnosis not present

## 2019-05-17 ENCOUNTER — Telehealth: Payer: Self-pay | Admitting: Physician Assistant

## 2019-05-17 NOTE — Telephone Encounter (Signed)
Spoke with patient. Patient would like to know when he can be cleared to return to work. Appointment made for a follow up visit with Fabian Sharp on 05/22/2019 to reevaluate patient returning to work.

## 2019-05-17 NOTE — Telephone Encounter (Signed)
  Pt's wondering when Harold Clements can release him so he can go back to work.  Please call

## 2019-05-19 DIAGNOSIS — N1831 Chronic kidney disease, stage 3a: Secondary | ICD-10-CM | POA: Diagnosis not present

## 2019-05-21 NOTE — Progress Notes (Deleted)
Cardiology Office Note:    Date:  05/21/2019   ID:  Harold Clements, DOB February 28, 1981, MRN [id number]  PCP:  Kerin Perna, NP  Cardiologist:  Sanda Klein, MD   Referring MD: Kerin Perna, NP   No chief complaint on file. ***  History of Present Illness:    Harold Clements is a 39 y.o. male with a hx of multiple hospitalizations for hypertensive urgency, chronic systolic and diastolic heart failure, DM type II, CKD stage III, OSA on CPAP, morbid obesity, and hx of medication noncompliance.  He was seen on 11/28/2018 by Dr. Sallyanne Kuster for a new patient evaluation.  He had presented to the ER on 9/27 with shortness of breath.  History of renal artery ultrasound in 2018 that was negative for renal artery stenosis.  On arrival his BP was noted to be 255/180 mmHg.  He does have a history of noncompliance and renal insufficiency which complicates his management.  Echo during that admission showed an EF of 45 to 50%, moderate concentric LVH, elevated mean LA pressures, grade 2 diastolic dysfunction, moderate biatrial enlargement, trivial circumferential antral pericardial effusion, and no significant valvular abnormalities.  Baseline serum creatinine thought to be 1.7.  Medications were titrated as followed: Beta-blocker transition to carvedilol, hydralazine was stopped, started on minoxidil, continued amlodipine, and clonidine was discontinued for medication noncompliance and risk of rebound hypertension.  His 40 mg lasix daily was resumed.  Unable to add an ace inhibitor or ARB due to renal insufficiency.  Given the above, he was still quite hypertensive. I increased minoxidil to 10 mg BID and increased coreg to 25 mg BID. He had instructions to return with a BP log. In consultation with Dr. Sallyanne Kuster, consider adding spiro and stop K instead of adding hydralazine.  He missed his follow up appt with me on 12/29/18. He unfortunately presented to [facility]/11/19 with stroke-like symptoms including  left-sided weakness and confusion. CT head with right basal ganglia hypodensities suspicious for acute infarct, no hemorrhage.  Echo 01/11/19 with further decrease in EF to 40-45%, grade 1 DD, normal left and right atrial size. Carotid duplex 01/12/19 with minimal plaque bilaterally. MRI brain with acute infarctions involving the right basal ganglia and small chronic infarct of the left thalamus.  He was discharged on ASA and plavix as well as lipitor 80 mg. Neurology recommended OP TEE to rule out cardiac source of embolic stroke in the setting of reduced EF.   I last saw him for a virtual visit 01/31/19 and his BP was much better controlled and he was recovering from his stroke. TEE ordered to evaluate cardioembolic source of his stroke revealed EF 45-50%, moderate LVH, no significant valvular disease, and no shunt. Dr. Sallyanne Kuster saw him 03/17/19 and questioned his BP reading.   Patient presents today for follow up and to be released to go back to work.     Hypertension - coreg 25 mg, amlodipine 10 mg, minoxidil 10 mg BID, 40 mg lasix -    Chronic systolic and diastolic heart failure - EF was slightly improved from 40-45% on TTE to 45-50% on TEE one month later - suspect this will get better with BP control - 40 mg lasix with 20 mEq potassium - encouraged low salt diet, daily weights, 2L fluid restriction   CVA 01/2019 - TEE negative for shunt, normal atrial sizes - will defer loop recorder at this time   CKD stage III - discharge sCr was 2.02 with a K of  3.4   OSA - on CPAP   Hyperlipidemia with LDL goal < 70 - lipitor 80 mg 01/12/2019: Cholesterol 134; HDL 32; LDL Cholesterol 78; Triglycerides 118; VLDL 24   Diabetes - A1c is 8.8% - on metformin and invokana         Past Medical History:  Diagnosis Date  . 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 Diabetes mellitus without complication ([place])   . Ear pain 07/2017  . Hypertension   . Stroke ([place])    . Wasp sting 08/04/2016   of dorsum of left hand near thumb    Past Surgical History:  Procedure Laterality Date  . BUBBLE STUDY  02/02/2019   Procedure: BUBBLE STUDY;  Surgeon: Lelon Perla, MD;  Location: [facility] ENDOSCOPY;  Service: Cardiovascular;;  . TEE WITHOUT CARDIOVERSION N/A 02/02/2019   Procedure: TRANSESOPHAGEAL ECHOCARDIOGRAM (TEE);  Surgeon: Lelon Perla, MD;  Location: [facility] ENDOSCOPY;  Service: Cardiovascular;  Laterality: N/A;    Current Medications: No outpatient medications have been marked as taking for the 05/22/19 encounter (Appointment) with [name], [place].     Allergies:   Zithromax [azithromycin dihydrate] and Kiwi extract   Social History   Socioeconomic History  . Marital status: Married    Spouse name: Not on file  . Number of children: Not on file  . Years of education: Not on file  . Highest education level: Not on file  Occupational History  . Occupation: Engineer, agricultural  Tobacco Use  . Smoking status: Former Smoker    Years: 4.00    Types: Cigars    Quit date: 2010    Years since quitting: 11.2  . Smokeless tobacco: Never Used  Substance and Sexual Activity  . Alcohol use: No  . Drug use: No  . Sexual activity: Yes  Other Topics Concern  . Not on file  Social History Narrative  . Not on file   Social Determinants of Health   Financial Resource Strain:   . Difficulty of Paying Living Expenses:   Food Insecurity:   . Worried About Charity fundraiser in the Last Year:   . Arboriculturist in the Last Year:   Transportation Needs:   . Film/video editor (Medical):   Marland Kitchen Lack of Transportation (Non-Medical):   Physical Activity:   . Days of Exercise per Week:   . Minutes of Exercise per Session:   Stress:   . Feeling of Stress :   Social Connections:   . Frequency of Communication with Friends and Family:   . Frequency of Social Gatherings with Friends and Family:   . Attends Religious Services:   . Active  Member of Clubs or Organizations:   . Attends Archivist Meetings:   Marland Kitchen Marital Status:      Family History: The patient's ***family history includes Diabetes in his mother; Heart disease in his mother; Hypertension in his father and mother; Stroke in his mother.  ROS:   Please see the history of present illness.    *** All other systems reviewed and are negative.  EKGs/Labs/Other Studies Reviewed:    The following studies were reviewed today: ***  EKG:  EKG is *** ordered today.  The ekg ordered today demonstrates ***  Recent Labs: 11/27/2018: B Natriuretic Peptide 402.3 01/11/2019: TSH 2.986 01/22/2019: ALT 12 01/24/2019: BUN 17; Creatinine, Ser 1.88; Hemoglobin 14.2; Platelets 271; Potassium 3.6; Sodium 139  Recent Lipid Panel  Component Value Date/Time   CHOL 134 01/12/2019 0339   TRIG 118 01/12/2019 0339   HDL 32 (L) 01/12/2019 0339   CHOLHDL 4.2 01/12/2019 0339   VLDL 24 01/12/2019 0339   LDLCALC 78 01/12/2019 0339    Physical Exam:    VS:  There were no vitals taken for this visit.    Wt Readings from Last 3 Encounters:  03/28/19 248 lb (112.5 kg)  03/17/19 246 lb (111.6 kg)  03/16/19 246 lb 12.8 oz (111.9 kg)     GEN: *** Well nourished, well developed in no acute distress HEENT: Normal NECK: No JVD; No carotid bruits LYMPHATICS: No lymphadenopathy CARDIAC: ***RRR, no murmurs, rubs, gallops RESPIRATORY:  Clear to auscultation without rales, wheezing or rhonchi  ABDOMEN: Soft, non-tender, non-distended MUSCULOSKELETAL:  No edema; No deformity  SKIN: Warm and dry NEUROLOGIC:  Alert and oriented x 3 PSYCHIATRIC:  Normal affect   ASSESSMENT:    No diagnosis found. PLAN:    In order of problems listed above:  No diagnosis found.   Medication Adjustments/Labs and Tests Ordered: Current medicines are reviewed at length with the patient today.  Concerns regarding medicines are outlined above.  No orders of the defined types were placed in  this encounter.  No orders of the defined types were placed in this encounter.   Signed, [name], [place]  05/21/2019 8:24 PM    [facility]

## 2019-05-22 ENCOUNTER — Ambulatory Visit: Payer: Medicaid Other | Admitting: Physician Assistant

## 2019-05-24 ENCOUNTER — Encounter (HOSPITAL_COMMUNITY): Payer: Self-pay

## 2019-05-24 ENCOUNTER — Ambulatory Visit (HOSPITAL_COMMUNITY)
Admission: EM | Admit: 2019-05-24 | Discharge: 2019-05-24 | Disposition: A | Discharge status: Home / Self Care | Payer: Medicaid Other | Attending: Family Medicine | Admitting: Family Medicine

## 2019-05-24 ENCOUNTER — Other Ambulatory Visit: Payer: Self-pay

## 2019-05-24 ENCOUNTER — Ambulatory Visit (INDEPENDENT_AMBULATORY_CARE_PROVIDER_SITE_OTHER): Payer: Medicaid Other

## 2019-05-24 DIAGNOSIS — M25511 Pain in right shoulder: Secondary | ICD-10-CM

## 2019-05-24 DIAGNOSIS — M19011 Primary osteoarthritis, right shoulder: Secondary | ICD-10-CM | POA: Diagnosis not present

## 2019-05-24 MED ORDER — PREDNISONE 10 MG (21) PO TBPK: ORAL_TABLET | ORAL | 0 refills | Status: DC

## 2019-05-24 NOTE — ED Provider Notes (Signed)
[place]    CSN: [id number] Arrival date & time: 05/24/19  0945      History   Chief Complaint Chief Complaint  Patient presents with  . Shoulder Pain    HPI Harold Clements is a 39 y.o. male.   Patient is a 39 year old male past medical history of CKD stage II, diabetes, hypertension, ischemic stroke, hypertensive crisis.  He presents today with right shoulder pain.  This began yesterday after waking up.  Denies any specific trauma or injuries to the shoulder.  Denies any heavy lifting.  Symptoms have been constant.  He has not taken any pain medication for his symptoms are done anything to treat his symptoms.  The pain is worsened by rotation of the shoulder and certain movements.  No swelling, fevers.  Patient hypertensive today at three 4/124.  He reports he did not take his medication this morning.  Denies any concerning symptoms to include blurred vision, dizziness, lightheadedness, headache, chest pain or shortness of breath.  Denies any radiation of pain down the right arm, numbness or tingling.  ROS per HPI      Past Medical History:  Diagnosis Date  . 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 Diabetes mellitus without complication ([place])   . Ear pain 07/2017  . Hypertension   . Stroke ([place])   . Wasp sting 08/04/2016   of dorsum of left hand near thumb    Patient Active Problem List   Diagnosis Date Noted  . Hypertensive heart disease 01/12/2019  . Ischemic stroke ([place]) 01/11/2019  . Stroke ([place]) 01/11/2019  . Hypertensive emergency 11/28/2018  . Hypertensive crisis 11/27/2018  . Obesity, Class III, BMI 40-49.9 (morbid obesity) ([place]) 11/27/2018  . Mastoiditis 08/12/2017  . Chronic kidney disease, stage 3a 08/17/2016  . Hypertension associated with diabetes ([place]) 08/06/2016  . Diabetes ([place]) 08/06/2016    Past Surgical History:  Procedure Laterality Date  . BUBBLE STUDY  02/02/2019   Procedure: BUBBLE STUDY;  Surgeon: Lelon Perla, MD;  Location: [facility] ENDOSCOPY;  Service: Cardiovascular;;  . TEE WITHOUT CARDIOVERSION N/A 02/02/2019   Procedure: TRANSESOPHAGEAL ECHOCARDIOGRAM (TEE);  Surgeon: Lelon Perla, MD;  Location: [facility] ENDOSCOPY;  Service: Cardiovascular;  Laterality: N/A;       Home Medications    Prior to Admission medications   Medication Sig Start Date End Date Taking? Authorizing Provider  ASPIRIN 81 PO Take by mouth.   Yes [provider]  atorvastatin (LIPITOR) 80 MG tablet Take 1 tablet (80 mg total) by mouth daily at 6 PM. 01/23/19  Yes Kerin Perna, NP  Insulin Isophane & Regular Human (NOVOLIN 70/30 FLEXPEN) (70-30) 100 UNIT/ML PEN Inject 26 Units into the skin 2 (two) times daily. 01/13/19  Yes [place], Carley D, DO  METFORMIN HCL PO Take 1,000 mg by mouth 2 (two) times daily.   Yes [provider]  minoxidil (LONITEN) 10 MG tablet Take 1 tablet (10 mg total) by mouth 2 (two) times daily. 04/12/19  Yes Duke, Tami Lin, PA  amLODipine (NORVASC) 10 MG tablet Take 1 tablet (10 mg total) by mouth daily. 01/20/19 03/16/19  Kerin Perna, NP  carvedilol (COREG) 25 MG tablet Take 1 tablet (25 mg total) by mouth 2 (two) times daily with a meal. 01/23/19 03/16/19  Croitoru, Mihai, MD  furosemide (LASIX) 40 MG tablet Take 1 tablet (40 mg total) by mouth daily. 04/12/19 05/12/19  Croitoru, Dani Gobble, MD  methocarbamol (ROBAXIN) 500  MG tablet Take 1 tablet (500 mg total) by mouth 4 (four) times daily as needed for muscle spasms. 01/24/19   Rory Percy, DO  potassium chloride (KLOR-CON) 20 MEQ packet Take 20 mEq by mouth daily. 01/20/19 03/16/19  Duke, Tami Lin, PA  predniSONE (STERAPRED UNI-PAK 21 TAB) 10 MG (21) TBPK tablet 6 tabs for 1 day, then 5 tabs for 1 das, then 4 tabs for 1 day, then 3 tabs for 1 day, 2 tabs for 1 day, then 1 tab for 1 day 05/24/19   Orvan July, NP    Family History Family History  Problem Relation Age of Onset  . Hypertension Mother     . Diabetes Mother   . Heart disease Mother   . Stroke Mother   . Hypertension Father     Social History Social History   Tobacco Use  . Smoking status: Former Smoker    Years: 4.00    Types: Cigars    Quit date: 2010    Years since quitting: 11.2  . Smokeless tobacco: Never Used  Substance Use Topics  . Alcohol use: No  . Drug use: No     Allergies   Zithromax [azithromycin dihydrate] and Kiwi extract   Review of Systems Review of Systems   Physical Exam Triage Vital Signs ED Triage Vitals  Enc Vitals Group     BP 05/24/19 1030 (!) 204/124     Pulse Rate 05/24/19 1030 82     Resp 05/24/19 1030 16     Temp 05/24/19 1030 98.5 F (36.9 C)     Temp src --      SpO2 05/24/19 1030 99 %     Weight 05/24/19 1027 240 lb (108.9 kg)     Height --      Head Circumference --      Peak Flow --      Pain Score 05/24/19 1027 8     Pain Loc --      Pain Edu? --      Excl. in [place]? --    No data found.  Updated Vital Signs BP (!) 204/124 (BP Location: Left Arm) Comment: patient has not taken medication today.  Pulse 82   Temp 98.5 F (36.9 C)   Resp 16   Wt 240 lb (108.9 kg)   SpO2 99%   BMI 34.44 kg/m   Visual Acuity Right Eye Distance:   Left Eye Distance:   Bilateral Distance:    Right Eye Near:   Left Eye Near:    Bilateral Near:     Physical Exam Vitals and nursing note reviewed.  Constitutional:      Appearance: Normal appearance.  HENT:     Head: Normocephalic and atraumatic.     Nose: Nose normal.  Eyes:     Conjunctiva/sclera: Conjunctivae normal.  Cardiovascular:     Rate and Rhythm: Normal rate and regular rhythm.  Pulmonary:     Effort: Pulmonary effort is normal.     Breath sounds: Normal breath sounds.  Musculoskeletal:     Cervical back: Normal range of motion.     Comments: Tender to palpation near [facility] joint with limited range of motion.  Difficulty with internal and external rotation of the shoulder. Equal grip strength.  Skin:     General: Skin is warm and dry.  Neurological:     General: No focal deficit present.     Mental Status: He is alert.     Motor: No  weakness.  Psychiatric:        Mood and Affect: Mood normal.      UC Treatments / Results  Labs (all labs ordered are listed, but only abnormal results are displayed) Labs Reviewed - No data to display  EKG   Radiology DG Shoulder Right  Result Date: 05/24/2019 CLINICAL DATA:  Shoulder pain with no known injury EXAM: RIGHT SHOULDER - 2+ VIEW COMPARISON:  09/01/2015 FINDINGS: Degenerative spurring at the acromioclavicular joint (mainly on the clavicular side) which is new from prior. The joint is normally aligned. No spurring or malalignment at the glenohumeral joint. Negative for fracture or erosion. IMPRESSION: Degenerative spurring at the [facility] joint that is new from 2017. Electronically Signed   By: Monte Fantasia M.D.   On: 05/24/2019 10:59    Procedures Procedures (including critical care time)  Medications Ordered in UC Medications - No data to display  Initial Impression / Assessment and Plan / UC Course  I have reviewed the triage vital signs and the nursing notes.  Pertinent labs & imaging results that were available during my care of the patient were reviewed by me and considered in my medical decision making (see chart for details).     Right shoulder pain Most likely due to the degenerative spurring at the [facility] joint Exam is consistent with this. Will treat with prednisone.  Avoiding NSAIDs due to chronic kidney disease. Recommended make sure he is monitoring his blood sugars due to possible slight increase from prednisone. Gentle range of motion exercises Ice, rest If this pain  continues he will need to follow with orthopedic specialist. Pt understanding and agreed to plan.  Final Clinical Impressions(s) / UC Diagnoses   Final diagnoses:  Acute pain of right shoulder     Discharge Instructions     You x ray did show some  changes around the [facility] joint with spurring.  This could be causing your pain I am treating this with prednisone.  You can also take tylenol for pain in addition.  You may need to follow up with orthopedics if this continues. You will probably need a referral from your primary care doctor for this.      ED Prescriptions    Medication Sig Dispense Auth. Provider   predniSONE (STERAPRED UNI-PAK 21 TAB) 10 MG (21) TBPK tablet 6 tabs for 1 day, then 5 tabs for 1 das, then 4 tabs for 1 day, then 3 tabs for 1 day, 2 tabs for 1 day, then 1 tab for 1 day 21 tablet Jackson Fetters A, NP     PDMP not reviewed this encounter.   Orvan July, NP 05/24/19 1129

## 2019-05-24 NOTE — ED Triage Notes (Signed)
Pt reports shoulder pain began yesterday after waking up. Patient does not remember any trauma to the area. Unable to extend arm out and behind.

## 2019-05-24 NOTE — Discharge Instructions (Addendum)
You x ray did show some changes around the [facility] joint with spurring.  This could be causing your pain I am treating this with prednisone.  You can also take tylenol for pain in addition.  You may need to follow up with orthopedics if this continues. You will probably need a referral from your primary care doctor for this.

## 2019-05-31 ENCOUNTER — Telehealth: Payer: Self-pay | Admitting: Cardiovascular Disease

## 2019-05-31 ENCOUNTER — Encounter: Payer: Self-pay | Admitting: Cardiovascular Disease

## 2019-05-31 NOTE — Telephone Encounter (Signed)
Left message for patient to call and  schedule Sleep Compliance appointment with Dr. Lysbeth Galas Tuesday 06/20/19 at 2:40pm--will mail information to patient.

## 2019-06-01 DIAGNOSIS — G4733 Obstructive sleep apnea (adult) (pediatric): Secondary | ICD-10-CM | POA: Diagnosis not present

## 2019-06-05 DIAGNOSIS — N1831 Chronic kidney disease, stage 3a: Secondary | ICD-10-CM | POA: Diagnosis not present

## 2019-06-05 DIAGNOSIS — E1122 Type 2 diabetes mellitus with diabetic chronic kidney disease: Secondary | ICD-10-CM | POA: Diagnosis not present

## 2019-06-05 DIAGNOSIS — I10 Essential (primary) hypertension: Secondary | ICD-10-CM | POA: Diagnosis not present

## 2019-06-05 DIAGNOSIS — R809 Proteinuria, unspecified: Secondary | ICD-10-CM | POA: Diagnosis not present

## 2019-06-05 DIAGNOSIS — N401 Enlarged prostate with lower urinary tract symptoms: Secondary | ICD-10-CM | POA: Diagnosis not present

## 2019-06-05 DIAGNOSIS — Z8673 Personal history of transient ischemic attack (TIA), and cerebral infarction without residual deficits: Secondary | ICD-10-CM | POA: Diagnosis not present

## 2019-06-06 ENCOUNTER — Encounter: Payer: Self-pay | Admitting: Physician Assistant

## 2019-06-12 NOTE — Progress Notes (Deleted)
Cardiology Office Note:    Date:  06/12/2019   ID:  Harold Clements, DOB [birth year], MRN [id number]  PCP:  Kerin Perna, NP  Cardiologist:  Tami Lin Jaymison Luber, PA   Referring MD: Kerin Perna, NP   No chief complaint on file. ***  History of Present Illness:    Harold Clements is a 39 y.o. male with a hx of multiple hospitalizations for hypertensive urgency, chronic systolic and diastolic heart failure, DM type II, CKD stage III, OSA on CPAP, morbid obesity, and hx of medication noncompliance.  He was seen on 11/28/2018 by Dr. Sallyanne Kuster for a new patient evaluation.  He had presented to the ER on 9/27 with shortness of breath.  History of renal artery ultrasound in 2018 that was negative for renal artery stenosis.  On arrival his BP was noted to be 255/180 mmHg.  He does have a history of noncompliance and renal insufficiency which complicates his management.  Echo during that admission showed an EF of 45 to 50%, moderate concentric LVH, elevated mean LA pressures, grade 2 diastolic dysfunction, moderate biatrial enlargement, trivial circumferential antral pericardial effusion, and no significant valvular abnormalities.  Baseline serum creatinine thought to be 1.7.  Medications were titrated as followed: Beta-blocker transition to carvedilol, hydralazine was stopped, started on minoxidil, continued amlodipine, and clonidine was discontinued for medication noncompliance and risk of rebound hypertension.  His 40 mg lasix daily was resumed.  Unable to add an ace inhibitor or ARB due to renal insufficiency.  Given the above, he was still quite hypertensive. I increased minoxidil to 10 mg BID and increased coreg to 25 mg BID. He had instructions to return with a BP log. In consultation with Dr. Sallyanne Kuster, consider adding spiro and stop K instead of adding hydralazine.  He missed his follow up appt with me on 12/29/18. He unfortunately presented to [facility]/11/19 with stroke-like symptoms including  left-sided weakness and confusion. CT head with right basal ganglia hypodensities suspicious for acute infarct, no hemorrhage.  Echo 01/11/19 with further decrease in EF to 40-45%, grade 1 DD, normal left and right atrial size. Carotid duplex 01/12/19 with minimal plaque bilaterally. MRI brain with acute infarctions involving the right basal ganglia and small chronic infarct of the left thalamus.  He was discharged on ASA and plavix as well as lipitor 80 mg. Neurology recommended OP TEE to rule out cardiac source of embolic stroke in the setting of reduced EF.   I last saw him for a virtual visit 01/31/19 and his BP was much better controlled and he was recovering from his stroke. TEE ordered to evaluate cardioembolic source of his stroke revealed EF 45-50%, moderate LVH, no significant valvular disease, and no shunt. Dr. Sallyanne Kuster saw him 03/17/19 and questioned his normal BP reading.   He was seen at urgent care 05/24/19 with right shoulder pain. He was significantly hypertensive at 204/124 due to not taking his medications that morning.   Patient presents today for follow up and to be released to go back to work.      Hypertension - coreg 25 mg, amlodipine 10 mg, minoxidil 10 mg BID, 40 mg lasix -    Chronic systolic and diastolic heart failure - EF was slightly improved from 40-45% on TTE to 45-50% on TEE one month later - suspect this will get better with BP control - 40 mg lasix with 20 mEq potassium - encouraged low salt diet, daily weights, 2L fluid restriction   CVA 01/2019 -  TEE negative for shunt, normal atrial sizes - will defer loop recorder at this time   CKD stage III - discharge sCr was 2.02 with a K of 3.4   OSA - on CPAP   Hyperlipidemia with LDL goal < 70 - lipitor 80 mg 01/12/2019: Cholesterol 134; HDL 32; LDL Cholesterol 78; Triglycerides 118; VLDL 24   Diabetes - A1c is 8.8% - on metformin and invokana       Past Medical History:  Diagnosis Date  .  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 Diabetes mellitus without complication ([place])   . Ear pain 07/2017  . Hypertension   . Stroke ([place])   . Wasp sting 08/04/2016   of dorsum of left hand near thumb    Past Surgical History:  Procedure Laterality Date  . BUBBLE STUDY  02/02/2019   Procedure: BUBBLE STUDY;  Surgeon: Lelon Perla, MD;  Location: [facility] [place] ENDOSCOPY;  Service: Cardiovascular;;  . TEE WITHOUT CARDIOVERSION N/A 02/02/2019   Procedure: TRANSESOPHAGEAL ECHOCARDIOGRAM (TEE);  Surgeon: Lelon Perla, MD;  Location: [facility] ENDOSCOPY;  Service: Cardiovascular;  Laterality: N/A;    Current Medications: No outpatient medications have been marked as taking for the 06/13/19 encounter (Appointment) with [name], Harold Clements.     Allergies:   Zithromax [azithromycin dihydrate] and Kiwi extract   Social History   Socioeconomic History  . Marital status: Married    Spouse name: Not on file  . Number of children: Not on file  . Years of education: Not on file  . Highest education level: Not on file  Occupational History  . Occupation: Engineer, agricultural  Tobacco Use  . Smoking status: Former Smoker    Years: 4.00    Types: Cigars    Quit date: 2010    Years since quitting: 11.2  . Smokeless tobacco: Never Used  Substance and Sexual Activity  . Alcohol use: No  . Drug use: No  . Sexual activity: Yes  Other Topics Concern  . Not on file  Social History Narrative  . Not on file   Social Determinants of Health   Financial Resource Strain:   . Difficulty of Paying Living Expenses:   Food Insecurity:   . Worried About Charity fundraiser in the Last Year:   . Arboriculturist in the Last Year:   Transportation Needs:   . Film/video editor (Medical):   Marland Kitchen Lack of Transportation (Non-Medical):   Physical Activity:   . Days of Exercise per Week:   . Minutes of Exercise per Session:   Stress:   . Feeling of Stress :   Social Connections:   .  Frequency of Communication with Friends and Family:   . Frequency of Social Gatherings with Friends and Family:   . Attends Religious Services:   . Active Member of Clubs or Organizations:   . Attends Archivist Meetings:   Marland Kitchen Marital Status:      Family History: The patient's ***family history includes Diabetes in his mother; Heart disease in his mother; Hypertension in his father and mother; Stroke in his mother.  ROS:   Please see the history of present illness.    *** All other systems reviewed and are negative.  EKGs/Labs/Other Studies Reviewed:    The following studies were reviewed today: ***  EKG:  EKG is *** ordered today.  The ekg ordered today demonstrates ***  Recent Labs: 11/27/2018: B Natriuretic Peptide  402.3 01/11/2019: TSH 2.986 01/22/2019: ALT 12 01/24/2019: BUN 17; Creatinine, Ser 1.88; Hemoglobin 14.2; Platelets 271; Potassium 3.6; Sodium 139  Recent Lipid Panel    Component Value Date/Time   CHOL 134 01/12/2019 0339   TRIG 118 01/12/2019 0339   HDL 32 (L) 01/12/2019 0339   CHOLHDL 4.2 01/12/2019 0339   VLDL 24 01/12/2019 0339   LDLCALC 78 01/12/2019 0339    Physical Exam:    VS:  There were no vitals taken for this visit.    Wt Readings from Last 3 Encounters:  05/24/19 240 lb (108.9 kg)  03/28/19 248 lb (112.5 kg)  03/17/19 246 lb (111.6 kg)     GEN: *** Well nourished, well developed in no acute distress HEENT: Normal NECK: No JVD; No carotid bruits LYMPHATICS: No lymphadenopathy CARDIAC: ***RRR, no murmurs, rubs, gallops RESPIRATORY:  Clear to auscultation without rales, wheezing or rhonchi  ABDOMEN: Soft, non-tender, non-distended MUSCULOSKELETAL:  No edema; No deformity  SKIN: Warm and dry NEUROLOGIC:  Alert and oriented x 3 PSYCHIATRIC:  Normal affect   ASSESSMENT:    No diagnosis found. PLAN:    In order of problems listed above:  No diagnosis found.   Medication Adjustments/Labs and Tests Ordered: Current  medicines are reviewed at length with the patient today.  Concerns regarding medicines are outlined above.  No orders of the defined types were placed in this encounter.  No orders of the defined types were placed in this encounter.   Signed, [name], [place]  06/12/2019 4:37 PM    [facility]

## 2019-06-13 ENCOUNTER — Ambulatory Visit: Payer: Medicaid Other | Admitting: Physician Assistant

## 2019-06-15 ENCOUNTER — Telehealth: Payer: Self-pay | Admitting: *Deleted

## 2019-06-15 ENCOUNTER — Ambulatory Visit: Payer: Medicaid Other | Admitting: Adult Health

## 2019-06-15 ENCOUNTER — Encounter: Payer: Self-pay | Admitting: Adult Health

## 2019-06-15 NOTE — Telephone Encounter (Signed)
Patient was no show for follow up with NP today.  

## 2019-06-15 NOTE — Progress Notes (Deleted)
[facility] [address] [place]. [place] [address] [phone]       OFFICE FOLLOW-UP NOTE  [name] Date of Birth:  January 29, 1981 Medical Record Number:  [id number]   SUBJECTIVE:  Chief complaint No chief complaint on file.    HPI  Today, 06/15/2019, Harold Clements returns for follow-up regarding right basal ganglia stroke in 01/2019.  He has been stable from a stroke standpoint with residual deficits of ***.  Continues on aspirin 81 mg daily and atorvastatin for stroke prevention.  Blood pressure today ***.  Glucose levels ***.  He did undergo sleep study on 03/28/2019 which showed severe OSA and recommended initiation of CPAP.  Continues to follow with [facility] for monitoring and management.  No concerns at this time.     History provided for reference purposes only Initial visit 03/16/2019 Dr. Leonie Man: Harold Clements is a 39 year old African-American male seen today for initial office follow-up visit from hospital admission for stroke in November 2020.  History is obtained from the patient, review of electronic medical records and have personally reviewed imaging films in PACS.  Harold Clements is a 39 year old African-American male with past medical history of diabetes, hypertension, chronic kidney disease stage II who woke up on 01/11/2019 with left facial droop and left-sided weakness.  His symptoms resolved by the time he reached the emergency room.  CT scan of the head showed low-density in the right basal ganglia and left medial basal ganglia possibly old strokes.  MRA of the head and neck revealed no large vessel intracranial or intraextracranial stenosis.  MRI scan of the brain confirmed right basal ganglia white matter infarcts with the patchy large enough to worry about embolus.  Carotid ultrasound showed no significant extracranial stenosis.  2D echo showed ejection fraction of 40 to 45% but it was stable compared to previous echo.  Transcranial  Doppler bubble study was negative for right-to-left shunt.  EEG showed no seizure activity.  TEE was obtained which showed no cardiac source of embolism PFO or clots.  LDL cholesterol was borderline at 78 mg percent and hemoglobin A1c was elevated at 8.8.  ESR was 22 mm.Sickle cell screen was negative. antiphospholipid antibodies were negative.  ANA was negative.  Homocystine was marginally elevated at 16.1.  There was discussion about considering loop recorder but given his young age of 50 it was not done.  Patient states she is done well since discharge has made a good recovery.  Very rarely does he find himself staggering and being off balance but has had no falls or injuries.  He is tolerating aspirin well without bruising or bleeding.  He states his blood pressure is under good control.  Is tolerating Lipitor well without muscle aches and pains.  His sugars are doing better and fasting sugars now ranging in the 90s.  Patient has never been evaluated for sleep apnea but does admit to snoring.  ROS:   14 system review of systems is positive for sleepiness, drowsiness, decreased stamina, occasional imbalance and gait difficulty all other systems negative  PMH:  Past Medical History:  Diagnosis Date   CKD (chronic kidney disease) stage 2, GFR 60-89 ml/min    Diabetes mellitus without complication ([place])    Ear pain 07/2017   Hypertension    Stroke (Harold Clements)    Wasp sting 08/04/2016   of dorsum of left hand near thumb    Social History:  Social History   Socioeconomic History   Marital  status: Married    Spouse name: Not on file   Number of children: Not on file   Years of education: Not on file   Highest education level: Not on file  Occupational History   Occupation: Engineer, agricultural  Tobacco Use   Smoking status: Former Smoker    Years: 4.00    Types: Cigars    Quit date: 2010    Years since quitting: 11.2   Smokeless tobacco: Never Used  Substance and Sexual Activity    Alcohol use: No   Drug use: No   Sexual activity: Yes  Other Topics Concern   Not on file  Social History Narrative   Not on file   Social Determinants of Health   Financial Resource Strain:    Difficulty of Paying Living Expenses:   Food Insecurity:    Worried About Charity fundraiser in the Last Year:    Arboriculturist in the Last Year:   Transportation Needs:    Film/video editor (Medical):    Lack of Transportation (Non-Medical):   Physical Activity:    Days of Exercise per Week:    Minutes of Exercise per Session:   Stress:    Feeling of Stress :   Social Connections:    Frequency of Communication with Friends and Family:    Frequency of Social Gatherings with Friends and Family:    Attends Religious Services:    Active Member of Clubs or Organizations:    Attends Music therapist:    Marital Status:   Intimate Partner Violence:    Fear of Current or Ex-Partner:    Emotionally Abused:    Physically Abused:    Sexually Abused:     Medications:   Current Outpatient Medications on File Prior to Visit  Medication Sig Dispense Refill   amLODipine (NORVASC) 10 MG tablet Take 1 tablet (10 mg total) by mouth daily. 90 tablet 1   ASPIRIN 81 PO Take by mouth.     atorvastatin (LIPITOR) 80 MG tablet Take 1 tablet (80 mg total) by mouth daily at 6 PM. 90 tablet 0   carvedilol (COREG) 25 MG tablet Take 1 tablet (25 mg total) by mouth 2 (two) times daily with a meal. 60 tablet 1   furosemide (LASIX) 40 MG tablet Take 1 tablet (40 mg total) by mouth daily. 90 tablet 3   Insulin Isophane & Regular Human (NOVOLIN 70/30 FLEXPEN) (70-30) 100 UNIT/ML PEN Inject 26 Units into the skin 2 (two) times daily. 15 mL 11   METFORMIN HCL PO Take 1,000 mg by mouth 2 (two) times daily.     methocarbamol (ROBAXIN) 500 MG tablet Take 1 tablet (500 mg total) by mouth 4 (four) times daily as needed for muscle spasms. 20 tablet 0   minoxidil  (LONITEN) 10 MG tablet Take 1 tablet (10 mg total) by mouth 2 (two) times daily. 60 tablet 2   potassium chloride (KLOR-CON) 20 MEQ packet Take 20 mEq by mouth daily. 90 packet 1   predniSONE (STERAPRED UNI-PAK 21 TAB) 10 MG (21) TBPK tablet 6 tabs for 1 day, then 5 tabs for 1 das, then 4 tabs for 1 day, then 3 tabs for 1 day, 2 tabs for 1 day, then 1 tab for 1 day 21 tablet 0   No current facility-administered medications on file prior to visit.    Allergies:   Allergies  Allergen Reactions   Zithromax [Azithromycin Dihydrate] Anaphylaxis   [employer]  Extract Other (See Comments)    Tongue goes numb    OBJECTIVE:   Vitals  There were no vitals filed for this visit. There is no height or weight on file to calculate BMI.   Physical Exam  General: mildly obese young African-American male, seated, in no evident distress Head: head normocephalic and atraumatic.  Neck: supple with no carotid or supraclavicular bruits Cardiovascular: regular rate and rhythm, no murmurs Musculoskeletal: no deformity Skin:  no rash/petichiae Vascular:  Normal pulses all extremities  Neurologic Exam Mental Status: Awake and fully alert. Oriented to place and time. Recent and remote memory intact. Attention span, concentration and fund of knowledge appropriate. Mood and affect appropriate.  Cranial Nerves: Fundoscopic exam reveals sharp disc margins. Pupils equal, briskly reactive to light. Extraocular movements full without nystagmus. Visual fields full to confrontation. Hearing intact. Facial sensation intact. Face, tongue, palate moves normally and symmetrically.  Motor: Normal bulk and tone. Normal strength in all tested extremity muscles. Sensory.: intact to touch ,pinprick .position and vibratory sensation.  Coordination: Rapid alternating movements normal in all extremities. Finger-to-nose and heel-to-shin performed accurately bilaterally. Gait and Station: Arises from chair without difficulty.  Stance is normal. Gait demonstrates normal stride length and balance . Able to heel, toe and tandem walk with slight difficulty.  Reflexes: 1+ and symmetric. Toes downgoing.      ASSESSMENT:   39 year old African-American male with right brain large patchy subcortical infarcts likely of cryptogenic etiology in November 2020 from which he is doing well with an excellent physical recovery.  Vascular risk factors of diabetes, hypertension, hyperlipidemia, obesity and suspected sleep apnea     PLAN:  I had a Schuermann d/w patient about his recent cryptogenic stroke, risk for recurrent stroke/TIAs, personally independently reviewed imaging studies and stroke evaluation results and answered questions.  -Continue aspirin 81 mg daily  for secondary stroke prevention  -maintain strict control of hypertension with blood pressure goal below 130/90, diabetes with hemoglobin A1c goal below 6.5% and lipids with LDL cholesterol goal below 70 mg/dL. I also advised the patient to eat a healthy diet with plenty of whole grains, cereals, fruits and vegetables, exercise regularly and maintain ideal body weight . check polysomnogram for sleep apnea  Followup in the future with my nurse practitioner Janett Billow in 3 months or call earlier if necessary.  Greater than 50% of time during this 25 minute visit was spent on counseling,explanation of diagnosis, planning of further management, discussion with patient and family and coordination of care   Frann Rider, [facility]  [facility] [address] [place], [place] [address]  Phone [phone] Fax [phone] Note: This document was prepared with digital dictation and possible smart phrase technology. Any transcriptional errors that result from this process are unintentional.

## 2019-06-16 ENCOUNTER — Ambulatory Visit: Payer: Medicaid Other | Admitting: Physician Assistant

## 2019-06-20 ENCOUNTER — Ambulatory Visit: Payer: Medicaid Other | Admitting: Cardiovascular Disease

## 2019-06-22 ENCOUNTER — Other Ambulatory Visit: Payer: Self-pay | Admitting: Internal Medicine

## 2019-06-22 DIAGNOSIS — Z8042 Family history of malignant neoplasm of prostate: Secondary | ICD-10-CM | POA: Diagnosis not present

## 2019-06-22 DIAGNOSIS — R8279 Other abnormal findings on microbiological examination of urine: Secondary | ICD-10-CM | POA: Diagnosis not present

## 2019-06-22 DIAGNOSIS — N401 Enlarged prostate with lower urinary tract symptoms: Secondary | ICD-10-CM | POA: Diagnosis not present

## 2019-06-22 DIAGNOSIS — E118 Type 2 diabetes mellitus with unspecified complications: Secondary | ICD-10-CM

## 2019-06-22 DIAGNOSIS — R351 Nocturia: Secondary | ICD-10-CM | POA: Diagnosis not present

## 2019-07-01 DIAGNOSIS — G4733 Obstructive sleep apnea (adult) (pediatric): Secondary | ICD-10-CM | POA: Diagnosis not present

## 2019-07-05 ENCOUNTER — Ambulatory Visit (INDEPENDENT_AMBULATORY_CARE_PROVIDER_SITE_OTHER): Payer: Medicaid Other | Admitting: Physician Assistant

## 2019-07-05 ENCOUNTER — Other Ambulatory Visit: Payer: Self-pay

## 2019-07-05 VITALS — BP 240/160 | HR 100 | Ht 70.0 in | Wt 261.0 lb

## 2019-07-05 DIAGNOSIS — I10 Essential (primary) hypertension: Secondary | ICD-10-CM

## 2019-07-05 DIAGNOSIS — Z8673 Personal history of transient ischemic attack (TIA), and cerebral infarction without residual deficits: Secondary | ICD-10-CM | POA: Diagnosis not present

## 2019-07-05 DIAGNOSIS — G4733 Obstructive sleep apnea (adult) (pediatric): Secondary | ICD-10-CM | POA: Diagnosis not present

## 2019-07-05 DIAGNOSIS — I5042 Chronic combined systolic (congestive) and diastolic (congestive) heart failure: Secondary | ICD-10-CM | POA: Diagnosis not present

## 2019-07-05 DIAGNOSIS — E119 Type 2 diabetes mellitus without complications: Secondary | ICD-10-CM | POA: Diagnosis not present

## 2019-07-05 DIAGNOSIS — E66813 Obesity, class 3: Secondary | ICD-10-CM

## 2019-07-05 MED ORDER — LOSARTAN POTASSIUM 25 MG PO TABS: 25.0000 mg | ORAL_TABLET | Freq: Every day | ORAL | 3 refills | Status: DC

## 2019-07-05 MED ORDER — MINOXIDIL 10 MG PO TABS: 10.0000 mg | ORAL_TABLET | Freq: Two times a day (BID) | ORAL | 3 refills | Status: DC

## 2019-07-05 MED ORDER — CARVEDILOL 25 MG PO TABS: 25.0000 mg | ORAL_TABLET | Freq: Two times a day (BID) | ORAL | 3 refills | Status: DC

## 2019-07-05 NOTE — Patient Instructions (Signed)
Medication Instructions:   Take Carvedilol (Coreg), Amlodipine, Minoxidil in the mornings  Take Carvedilol (Coreg), Minoxidil and Losartan in the evenings *If you need a refill on your cardiac medications before your next appointment, please call your pharmacy*  Lab Work: NONE ordered at this time of appointment   If you have labs (blood work) drawn today and your tests are completely normal, you will receive your results only by: Marland Kitchen MyChart Message (if you have MyChart) OR . A paper copy in the mail If you have any lab test that is abnormal or we need to change your treatment, we will call you to review the results.  Testing/Procedures: NONE ordered at this time of appointment   Follow-Up: At [facility], you and your health needs are our priority.  As part of our continuing mission to provide you with exceptional heart care, we have created designated Provider Care Teams.  These Care Teams include your primary Cardiologist (physician) and Advanced Practice Providers (APPs -  Physician Assistants and Nurse Practitioners) who all work together to provide you with the care you need, when you need it.  Your next appointment:   2 week(s)  The format for your next appointment:   In Person  Provider:   Skeet Latch, MD [facility]  Other Instructions  If your blood pressure is persistently above 200 give out office a call.

## 2019-07-05 NOTE — Progress Notes (Signed)
Cardiology Office Note:    Date:  07/07/2019   ID:  Harold Clements, DOB 1980-06-06, MRN [id number]  PCP:  Kerin Perna, NP  Cardiologist:  Sanda Klein, MD  Electrophysiologist:  None   Referring MD: Kerin Perna, NP   Chief Complaint  Patient presents with  . Follow-up    seen for Dr. Sallyanne Kuster    History of Present Illness:    Harold Clements is a 39 y.o. male with a hx of chronic diastolic heart failure, hypertension, CKD stage III, DM 2, obstructive sleep apnea with incomplete compliance with CPAP, morbid obesity and CVA.  Renal artery ultrasound in 2018 was negative for renal artery stenosis.  Patient was admitted in November 2020 with left-sided weakness and confusion and work-up was positive for stroke in the right basal ganglia.  Initial echocardiogram obtained on 01/11/2019 showed EF 40 to 45%, grade 1 DD, normal left and right atrial size.  Carotid Doppler was negative for acute process.  MRI of the brain confirms acute infarction involving the right basal ganglia and a small chronic infarction in the left thalamus.  He underwent permissive hypertension for 2 days following his stroke.  He was eventually discharged on aspirin, Plavix and Lipitor.  Given LV dysfunction, neurology recommend outpatient TEE.  He eventually underwent TEE on 02/02/2019 that showed EF 45 to 50%, moderate LVH, moderate LAE, no significant valve abnormality.  She was recently seen by Dr. Kathrine Haddock of [facility], Lasix was reduced to 40 mg Monday Wednesday Friday, losartan 25 mg daily was started.  He was also started on Flomax as well.  Potassium was discontinued.  Patient presents today for follow-up.  His wife was also on the phone as well.  The last time he took any blood pressure medication was yesterday morning.  He has not been taking that afternoon dose of carvedilol or minoxidil.  He also admits that this morning his blood pressure medication as well.  On arrival his blood  pressure was 240/160.  Even on manual recheck by myself, blood pressure was 236/160.  He denies any ipsilateral weakness, headache, or slurring of speech.  I did a full neuro exam which was negative for focal deficit.  I discussed the case with Dr. Alvester Chou, he will need to restart the blood pressure medication as soon as he reached home.  He also mentions that he never received the losartan prescription from the nephrology service and asked me to write him a prescription.  I recommended him to start taking the losartan nighttime.  This way he will be on amlodipine, carvedilol and minoxidil in the morning.  He will be on carvedilol, minoxidil and the losartan at night.  I plan for him to be followed at the hypertension clinic in the next 2 weeks.  Past Medical History:  Diagnosis Date  . 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 Diabetes mellitus without complication ([place])   . Ear pain 07/2017  . Hypertension   . Stroke ([place])   . Wasp sting 08/04/2016   of dorsum of left hand near thumb    Past Surgical History:  Procedure Laterality Date  . BUBBLE STUDY  02/02/2019   Procedure: BUBBLE STUDY;  Surgeon: Lelon Perla, MD;  Location: [facility] ENDOSCOPY;  Service: Cardiovascular;;  . TEE WITHOUT CARDIOVERSION N/A 02/02/2019   Procedure: TRANSESOPHAGEAL ECHOCARDIOGRAM (TEE);  Surgeon: Lelon Perla, MD;  Location: [facility];  Service: Cardiovascular;  Laterality: N/A;  Current Medications: Current Meds  Medication Sig  . ASPIRIN 81 PO Take by mouth.  Marland Kitchen atorvastatin (LIPITOR) 80 MG tablet Take 1 tablet (80 mg total) by mouth daily at 6 PM.  . Insulin Isophane & Regular Human (NOVOLIN 70/30 FLEXPEN) (70-30) 100 UNIT/ML PEN Inject 26 Units into the skin 2 (two) times daily.  Marland Kitchen losartan (COZAAR) 25 MG tablet Take 1 tablet (25 mg total) by mouth daily.  Marland Kitchen METFORMIN HCL PO Take 1,000 mg by mouth 2 (two) times daily.  . methocarbamol (ROBAXIN) 500 MG tablet Take 1 tablet (500 mg  total) by mouth 4 (four) times daily as needed for muscle spasms.  . minoxidil (LONITEN) 10 MG tablet Take 1 tablet (10 mg total) by mouth 2 (two) times daily.  . predniSONE (STERAPRED UNI-PAK 21 TAB) 10 MG (21) TBPK tablet 6 tabs for 1 day, then 5 tabs for 1 das, then 4 tabs for 1 day, then 3 tabs for 1 day, 2 tabs for 1 day, then 1 tab for 1 day  . [DISCONTINUED] losartan (COZAAR) 25 MG tablet Take 25 mg by mouth daily.  . [DISCONTINUED] minoxidil (LONITEN) 10 MG tablet Take 1 tablet (10 mg total) by mouth 2 (two) times daily.     Allergies:   Zithromax [azithromycin dihydrate] and Kiwi extract   Social History   Socioeconomic History  . Marital status: Married    Spouse name: Not on file  . Number of children: Not on file  . Years of education: Not on file  . Highest education level: Not on file  Occupational History  . Occupation: Engineer, agricultural  Tobacco Use  . Smoking status: Former Smoker    Years: 4.00    Types: Cigars    Quit date: 2010    Years since quitting: 11.3  . Smokeless tobacco: Never Used  Substance and Sexual Activity  . Alcohol use: No  . Drug use: No  . Sexual activity: Yes  Other Topics Concern  . Not on file  Social History Narrative  . Not on file   Social Determinants of Health   Financial Resource Strain:   . Difficulty of Paying Living Expenses:   Food Insecurity:   . Worried About Charity fundraiser in the Last Year:   . Arboriculturist in the Last Year:   Transportation Needs:   . Film/video editor (Medical):   Marland Kitchen Lack of Transportation (Non-Medical):   Physical Activity:   . Days of Exercise per Week:   . Minutes of Exercise per Session:   Stress:   . Feeling of Stress :   Social Connections:   . Frequency of Communication with Friends and Family:   . Frequency of Social Gatherings with Friends and Family:   . Attends Religious Services:   . Active Member of Clubs or Organizations:   . Attends Archivist  Meetings:   Marland Kitchen Marital Status:      Family History: The patient's family history includes Diabetes in his mother; Heart disease in his mother; Hypertension in his father and mother; Stroke in his mother.  ROS:   Please see the history of present illness.     All other systems reviewed and are negative.  EKGs/Labs/Other Studies Reviewed:    The following studies were reviewed today:  Echo 01/11/2019 IMPRESSIONS    1. Left ventricular ejection fraction, by visual estimation, is 40 to  45%. The left ventricle has mild to moderately decreased function. Left  ventricular septal wall thickness was mildly increased. Mildly increased  left ventricular posterior wall  thickness. There is mildly increased left ventricular hypertrophy.  2. Left ventricular diastolic parameters are consistent with Grade I  diastolic dysfunction (impaired relaxation).  3. The left ventricle demonstrates global hypokinesis.  4. Global right ventricle has normal systolic function.The right  ventricular size is normal. No increase in right ventricular wall  thickness.  5. Left atrial size was normal.  6. Right atrial size was normal.  7. The mitral valve is normal in structure. Trace mitral valve  regurgitation. No evidence of mitral stenosis.  8. The tricuspid valve is normal in structure. Tricuspid valve  regurgitation is not demonstrated.  9. The aortic valve is normal in structure. Aortic valve regurgitation is  not visualized. No evidence of aortic valve sclerosis or stenosis.  10. The pulmonic valve was normal in structure. Pulmonic valve  regurgitation is not visualized.  11. The inferior vena cava is normal in size with greater than 50%  respiratory variability, suggesting right atrial pressure of 3 mmHg.   EKG:  EKG is ordered today.  The ekg ordered today demonstrates normal sinus rhythm, poor R wave progression in the anterior leads, LVH.  Recent Labs: 11/27/2018: B Natriuretic Peptide  402.3 01/11/2019: TSH 2.986 01/22/2019: ALT 12 01/24/2019: BUN 17; Creatinine, Ser 1.88; Hemoglobin 14.2; Platelets 271; Potassium 3.6; Sodium 139  Recent Lipid Panel    Component Value Date/Time   CHOL 134 01/12/2019 0339   TRIG 118 01/12/2019 0339   HDL 32 (L) 01/12/2019 0339   CHOLHDL 4.2 01/12/2019 0339   VLDL 24 01/12/2019 0339   LDLCALC 78 01/12/2019 0339    Physical Exam:    VS:  BP (!) 240/160   Pulse 100   Ht 5\' 10"  (1.778 m)   Wt 261 lb (118.4 kg)   SpO2 98%   BMI 37.45 kg/m     Wt Readings from Last 3 Encounters:  07/05/19 261 lb (118.4 kg)  05/24/19 240 lb (108.9 kg)  03/28/19 248 lb (112.5 kg)     GEN:  Well nourished, well developed in no acute distress HEENT: Normal NECK: No JVD; No carotid bruits LYMPHATICS: No lymphadenopathy CARDIAC: RRR, no murmurs, rubs, gallops RESPIRATORY:  Clear to auscultation without rales, wheezing or rhonchi  ABDOMEN: Soft, non-tender, non-distended MUSCULOSKELETAL:  No edema; No deformity  SKIN: Warm and dry NEUROLOGIC:  Alert and oriented x 3 PSYCHIATRIC:  Normal affect   ASSESSMENT:    1. Uncontrolled hypertension   2. Chronic combined systolic and diastolic heart failure ([place])   3. Controlled type 2 diabetes mellitus without complication, without Ke-term current use of insulin ([place])   4. OSA (obstructive sleep apnea)   5. Obesity, Class III, BMI 40-49.9 (morbid obesity) ([place])   6. H/O: CVA (cerebrovascular accident)    PLAN:    In order of problems listed above:  1. Uncontrolled hypertension: Blood pressure was 240/160 today.  Even on repeat manual recheck, blood pressure remains elevated.  He has not been taking the nighttime dose of carvedilol, he also missed the nighttime dose of minoxidil as well.  He did not take any blood pressure medications this morning.  I did a full neuro exam on him given the prior history of CVA, he does not have any ipsilateral weakness.  I discussed the case with DOD Dr. Gwenlyn Found, I  instructed the patient to start taking the blood pressure medication as soon as he reached home.  I also emphasized  the importance of medication compliance with his wife.  I will refer the patient to our hypertension clinic  2. Chronic combined systolic and diastolic heart failure: Euvolemic on exam  3. DM2: Managed by primary care provider  4. Obstructive sleep apnea: History of incomplete CPAP compliance.  This is likely contributing to elevated blood pressure  5. Obesity: Weight loss imperative  6. History of CVA: No recurrence.   Medication Adjustments/Labs and Tests Ordered: Current medicines are reviewed at length with the patient today.  Concerns regarding medicines are outlined above.  Orders Placed This Encounter  Procedures  . EKG 12-Lead   Meds ordered this encounter  Medications  . minoxidil (LONITEN) 10 MG tablet    Sig: Take 1 tablet (10 mg total) by mouth 2 (two) times daily.    Dispense:  180 tablet    Refill:  3  . carvedilol (COREG) 25 MG tablet    Sig: Take 1 tablet (25 mg total) by mouth 2 (two) times daily with a meal.    Dispense:  180 tablet    Refill:  3  . losartan (COZAAR) 25 MG tablet    Sig: Take 1 tablet (25 mg total) by mouth daily.    Dispense:  90 tablet    Refill:  3    Patient Instructions  Medication Instructions:   Take Carvedilol (Coreg), Amlodipine, Minoxidil in the mornings  Take Carvedilol (Coreg), Minoxidil and Losartan in the evenings *If you need a refill on your cardiac medications before your next appointment, please call your pharmacy*  Lab Work: NONE ordered at this time of appointment   If you have labs (blood work) drawn today and your tests are completely normal, you will receive your results only by: Marland Kitchen MyChart Message (if you have MyChart) OR . A paper copy in the mail If you have any lab test that is abnormal or we need to change your treatment, we will call you to review the results.  Testing/Procedures: NONE  ordered at this time of appointment   Follow-Up: At [facility], you and your health needs are our priority.  As part of our continuing mission to provide you with exceptional heart care, we have created designated Provider Care Teams.  These Care Teams include your primary Cardiologist (physician) and Advanced Practice Providers (APPs -  Physician Assistants and Nurse Practitioners) who all work together to provide you with the care you need, when you need it.  Your next appointment:   2 week(s)  The format for your next appointment:   In Person  Provider:   Skeet Latch, MD HTN Clinic  Other Instructions  If your blood pressure is persistently above 200 give out office a call.      Hilbert Corrigan, [place]  07/07/2019 11:37 PM    [facility]

## 2019-07-07 ENCOUNTER — Encounter: Payer: Self-pay | Admitting: Physician Assistant

## 2019-07-24 DIAGNOSIS — N1831 Chronic kidney disease, stage 3a: Secondary | ICD-10-CM | POA: Diagnosis not present

## 2019-08-01 DIAGNOSIS — G4733 Obstructive sleep apnea (adult) (pediatric): Secondary | ICD-10-CM | POA: Diagnosis not present

## 2019-08-03 DIAGNOSIS — Z9114 Patient's other noncompliance with medication regimen: Secondary | ICD-10-CM | POA: Diagnosis not present

## 2019-08-03 DIAGNOSIS — N401 Enlarged prostate with lower urinary tract symptoms: Secondary | ICD-10-CM | POA: Diagnosis not present

## 2019-08-03 DIAGNOSIS — E559 Vitamin D deficiency, unspecified: Secondary | ICD-10-CM | POA: Diagnosis not present

## 2019-08-03 DIAGNOSIS — I10 Essential (primary) hypertension: Secondary | ICD-10-CM | POA: Diagnosis not present

## 2019-08-03 DIAGNOSIS — Z8673 Personal history of transient ischemic attack (TIA), and cerebral infarction without residual deficits: Secondary | ICD-10-CM | POA: Diagnosis not present

## 2019-08-03 DIAGNOSIS — R809 Proteinuria, unspecified: Secondary | ICD-10-CM | POA: Diagnosis not present

## 2019-08-03 DIAGNOSIS — N1831 Chronic kidney disease, stage 3a: Secondary | ICD-10-CM | POA: Diagnosis not present

## 2019-08-03 DIAGNOSIS — E1122 Type 2 diabetes mellitus with diabetic chronic kidney disease: Secondary | ICD-10-CM | POA: Diagnosis not present

## 2019-08-11 DIAGNOSIS — R311 Benign essential microscopic hematuria: Secondary | ICD-10-CM | POA: Diagnosis not present

## 2019-08-11 DIAGNOSIS — N401 Enlarged prostate with lower urinary tract symptoms: Secondary | ICD-10-CM | POA: Diagnosis not present

## 2019-08-11 DIAGNOSIS — R351 Nocturia: Secondary | ICD-10-CM | POA: Diagnosis not present

## 2019-08-24 ENCOUNTER — Telehealth: Payer: Self-pay | Admitting: Physician Assistant

## 2019-08-24 NOTE — Telephone Encounter (Signed)
His systolic pressure was 378 mmHg during the visit, I am ok with him returning to work as [place] as his blood pressure is better controlled. I referred him to hypertension clinic for better BP during last office, however this never happened. Please make sure he is seen by the hypertension clinic before final clearance

## 2019-08-24 NOTE — Telephone Encounter (Signed)
Patient's wife states that Doreene Adas PA wrote patient out of work for 3 months - the note is from 01/2019. 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 His BP was elevated during this visit and he was to f/u with HTN clinic but this was never scheduled/completed   Will defer to PA to advise when/if patient can return to work

## 2019-08-24 NOTE — Telephone Encounter (Signed)
I will defer to Almyra Deforest [facility] for this, since he saw him last.  Isaac Laud, do you feel comfortable clearing him for work after your exam in early May?  Otherwise, he can be cleared for work by neuro/PT.

## 2019-08-24 NOTE — Telephone Encounter (Signed)
New message   Pt wife callled. She wants to know if pt is able to return work? If so, he will need a note for work stating this it alright for him to return to work.

## 2019-08-25 DIAGNOSIS — I129 Hypertensive chronic kidney disease with stage 1 through stage 4 chronic kidney disease, or unspecified chronic kidney disease: Secondary | ICD-10-CM | POA: Diagnosis not present

## 2019-08-25 NOTE — Telephone Encounter (Signed)
Spoke with patient's wife and relayed info per PA. Patient's wife states it is best for patient to have an OV on Friday b/c she works [employer] and he keeps their kids those days. She states that he can make the July 12 visit with Dr. Claiborne Billings (sleep) as this was already scheduled and she can arrange to be off work. There was no sooner [facility] HTN clinic appt on a Friday APP visit - assume MD will adjust BP meds if needed at this visit and/or refer to HTN clinic if needed.   Wife also states patient has been out of work since Nov 2020 with no income, denied disability. Will contact care guide/social worker for assistance

## 2019-08-29 ENCOUNTER — Telehealth: Payer: Self-pay | Admitting: Physician Assistant

## 2019-08-29 NOTE — Telephone Encounter (Signed)
See phone note from 08/24/19, patient's wife would like a letter stating that the patient needs to be out of work. She also is requesting to have a meeting with Fabian Sharp.

## 2019-08-29 NOTE — Telephone Encounter (Signed)
Spoke to [employer] PA-patient needs HTN clinic or OV f/u HTN.    Patient needs to take blood pressure medication 2 hours prior to appointment.      No htn clinic availability-scheduled to see Harold Deforest PA 7/1 at 9:15 (need to confirm with patient and wife)   Left message to call back  Can write letter stating he is out of work until next appt (7/1) and at that time will be reassessed.

## 2019-08-29 NOTE — Telephone Encounter (Signed)
Returned call to wife who is requesting letter for patient being out of work.   Per last OV note, patient is out of work until BP is better controlled.    Wife states she needs letter stating why he is out of work and for how Rog.   She is trying to get financial help since he is unable to work.  She states she called disability and they cannot help them. Also states he was denied unemployment.  He does not have short term disability.    She is very overwhelmed with their financial situation currently as they have a newborn as well.      Advised would send to PA to see if he can create letter per his last recommendations.  She is also requesting to speak with PA if possible.  Advised he is OOO today, unsure if he will be able to call today.     Advised wife would be back in touch today to update her.     Careguide also contacted to see if she can reach out to patient and wife asap.

## 2019-08-30 ENCOUNTER — Telehealth (HOSPITAL_COMMUNITY): Payer: Self-pay | Admitting: Licensed Clinical Social Worker

## 2019-08-30 NOTE — Progress Notes (Signed)
Heart and Vascular Care Navigation  08/30/2019  Harold Clements 1980-10-07 [id number]  Reason for Referral: CSW referred to assist with disability options.                                                                                                    Assessment: Patient is a 39yo male who is married with 3 children ages 46, 23 and 77 months. He reports he was working at [employer] until 5 months ago when he was told that he could no longer work due to HTN. He reports he does not have short term disability through his employer.                                      HRT/VAS Care Coordination    Patients Home Cardiology Office [place] Team Social Worker   Social Worker Name: Raquel Sarna, Marlinda Mike [phone]   Living arrangements for the past 2 months Single Family Home   Lives with: Spouse; Minor Children  9yo, 7yo and 5 months   Patient Current Insurance Coverage Medicaid   Patient Has Concern With Paying Medical Bills No   Does Patient Have Prescription Coverage? Yes   Home Assistive Devices/Equipment Blood pressure cuff      Social History:                                                                             SDOH Screenings   Alcohol Screen:   . Last Alcohol Screening Score (AUDIT):   Depression (PHQ2-9):   . PHQ-2 Score:   Financial Resource Strain: High Risk  . Difficulty of Paying Living Expenses: Hard  Food Insecurity: Food Insecurity Present  . Worried About Charity fundraiser in the Last Year: Often true  . Ran Out of Food in the Last Year: Often true  Housing: Medium Risk  . Last Housing Risk Score: 1  Physical Activity:   . Days of Exercise per Week:   . Minutes of Exercise per Session:   Social Connections:   . Frequency of Communication with Friends and Family:   . Frequency of Social Gatherings with Friends and Family:   . Attends Religious Services:   . Active Member of Clubs or Organizations:   . Attends Theatre manager Meetings:   Marland Kitchen Marital Status:   Stress:   . Feeling of Stress :   Tobacco Use: Medium Risk  . Smoking Tobacco Use: Former Smoker  . Smokeless Tobacco Use: Never Used  Transportation Needs: No Transportation Needs  . Lack of Transportation (Medical): No  . Lack of Transportation (Non-Medical): No    SDOH Interventions: Financial Resources:    Patient will retrun  call to [place] tomorrow after MD appointment for further financial assistance.  Food Insecurity:  Food Insecurity Interventions: Assist with [employer])  Housing Insecurity:   Possible need for assistance with rent  Transportation:    n/a    Follow-up plan:  Patient does not have short term disability benefits through his employer. Patient will speak further with MD at tomorrow's appointment regarding ability to return to work and return call to [place].

## 2019-08-31 ENCOUNTER — Ambulatory Visit (INDEPENDENT_AMBULATORY_CARE_PROVIDER_SITE_OTHER): Payer: Medicaid Other | Admitting: Physician Assistant

## 2019-08-31 ENCOUNTER — Encounter: Payer: Self-pay | Admitting: Physician Assistant

## 2019-08-31 ENCOUNTER — Ambulatory Visit: Payer: Self-pay | Admitting: Physician Assistant

## 2019-08-31 ENCOUNTER — Other Ambulatory Visit: Payer: Self-pay

## 2019-08-31 ENCOUNTER — Telehealth: Payer: Self-pay | Admitting: Physician Assistant

## 2019-08-31 VITALS — BP 180/120 | HR 64 | Ht 70.0 in | Wt 265.0 lb

## 2019-08-31 DIAGNOSIS — Z9989 Dependence on other enabling machines and devices: Secondary | ICD-10-CM

## 2019-08-31 DIAGNOSIS — E119 Type 2 diabetes mellitus without complications: Secondary | ICD-10-CM

## 2019-08-31 DIAGNOSIS — Z8673 Personal history of transient ischemic attack (TIA), and cerebral infarction without residual deficits: Secondary | ICD-10-CM

## 2019-08-31 DIAGNOSIS — I10 Essential (primary) hypertension: Secondary | ICD-10-CM

## 2019-08-31 DIAGNOSIS — G4733 Obstructive sleep apnea (adult) (pediatric): Secondary | ICD-10-CM

## 2019-08-31 DIAGNOSIS — I5032 Chronic diastolic (congestive) heart failure: Secondary | ICD-10-CM

## 2019-08-31 DIAGNOSIS — N183 Chronic kidney disease, stage 3 unspecified: Secondary | ICD-10-CM

## 2019-08-31 DIAGNOSIS — Z79899 Other long term (current) drug therapy: Secondary | ICD-10-CM

## 2019-08-31 NOTE — Patient Instructions (Signed)
Medication Instructions:   Repeat blood pressure check in 2 weeks if Systolic (top) number is greater than 140 increase Hydralazine to 100 mg 3 times a day *If you need a refill on your cardiac medications before your next appointment, please call your pharmacy*  Lab Work: Your physician recommends that you return for lab work TODAY:   BMET If you have labs (blood work) drawn today and your tests are completely normal, you will receive your results only by: Marland Kitchen MyChart Message (if you have MyChart) OR . A paper copy in the mail If you have any lab test that is abnormal or we need to change your treatment, we will call you to review the results.  Testing/Procedures: NONE ordered at this time of appointment   Follow-Up: At [facility], you and your health needs are our priority.  As part of our continuing mission to provide you with exceptional heart care, we have created designated Provider Care Teams.  These Care Teams include your primary Cardiologist (physician) and Advanced Practice Providers (APPs -  Physician Assistants and Nurse Practitioners) who all work together to provide you with the care you need, when you need it.  We recommend signing up for the patient portal called "MyChart".  Sign up information is provided on this After Visit Summary.  MyChart is used to connect with patients for Virtual Visits (Telemedicine).  Patients are able to view lab/test results, encounter notes, upcoming appointments, etc.  Non-urgent messages can be sent to your provider as well.   To learn more about what you can do with MyChart, go to [email or web address].    Your next appointment:   1 month(s)  The format for your next appointment:   In Person  Provider:   Skeet Latch, MD Advanced HTN Clinic  Other Instructions

## 2019-08-31 NOTE — Progress Notes (Signed)
Cardiology Office Note:    Date:  09/01/2019   ID:  Harold Clements, DOB Jul 22, 1980, MRN [id number]  PCP:  Kerin Perna, NP  [facility] Cardiologist:  Sanda Klein, MD  [facility] Electrophysiologist:  None   Referring MD: Kerin Perna, NP   Chief Complaint  Patient presents with  . Follow-up    seen for Dr. Sallyanne Kuster    History of Present Illness:    Harold Clements is a 39 y.o. male with a hx of chronic diastolic heart failure, HTN, CKD stage III, DM 2, OSA with incomplete compliance with CPAP, morbid obesity and CVA.  Renal artery ultrasound in 2018 was negative for renal artery stenosis.  Patient was admitted in November 2020 with left-sided weakness and confusion and work-up was positive for stroke in the right basal ganglia.  Initial echocardiogram obtained on 01/11/2019 showed EF 40 to 45%, grade 1 DD, normal left and right atrial size.  Carotid Doppler was negative for acute process.  MRI of the brain confirms acute infarction involving the right basal ganglia and a small chronic infarction in the left thalamus.  He underwent permissive hypertension for 2 days following his stroke.  He was eventually discharged on aspirin, Plavix and Lipitor.  Given LV dysfunction, neurology recommend outpatient TEE.  He eventually underwent TEE on 02/02/2019 that showed EF 45 to 50%, moderate LVH, moderate LAE, no significant valve abnormality.  She was recently seen by Dr. Harrie Jeans of [facility], Lasix was reduced to 40 mg Monday Wednesday Friday, losartan 25 mg daily was started.  He was also started on Flomax as well.  Potassium was discontinued.  I last saw the patient on 07/05/2019, at the time, he was not taking the afternoon doses of carvedilol or Minoxidil.  His blood pressure was 240/160.  He did not have any focal neurological deficit on exam.  I spoke with DOD Dr. Gwenlyn Found who recommended him to restart blood pressure medication as soon as he reached home.  I  referred the patient to hypertension clinic to have follow-up in 2 weeks, this did not occur.   Patient presents today for cardiology office visit.  He denies any recent chest pain or shortness of breath.  His wife was on the phone with him.  Compared to the last office visit, hydralazine 25 mg 3 times daily was added to his medical regimen, he says that the dosage was later increased to 50 mg 3 times daily roughly 2 days ago.  Spironolactone 25 mg was also added to his medical regimen.  He is no longer on Lasix.  His blood pressure today is still elevated at 180/120, however this is significantly improved compared to the previous blood pressure.  At this time, he is cleared to go back to work now his blood pressure is below the reasonable level.  He is aware that his blood pressure remain high and will need to continue to work on blood pressure control.  Since his blood pressure medication hydralazine was just adjusted 2 days ago, I recommended repeat blood pressure in 2 weeks and if systolic blood pressures greater than 140 then increase hydralazine to 100 mg 3 times daily.  Otherwise he can follow-up with Dr. Oval Linsey had advanced hypertension clinic in 1 month.   Past Medical History:  Diagnosis Date  . 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 Diabetes mellitus without complication ([place])   . Ear pain 07/2017  . Hypertension   .  Stroke ([place])   . Wasp sting 08/04/2016   of dorsum of left hand near thumb    Past Surgical History:  Procedure Laterality Date  . BUBBLE STUDY  02/02/2019   Procedure: BUBBLE STUDY;  Surgeon: Lelon Perla, MD;  Location[facility] ENDOSCOPY;  Service: Cardiovascular;;  . TEE WITHOUT CARDIOVERSION N/A 02/02/2019   Procedure: TRANSESOPHAGEAL ECHOCARDIOGRAM (TEE);  Surgeon: Lelon Perla, MD;  Location: [facility] ENDOSCOPY;  Service: Cardiovascular;  Laterality: N/A;    Current Medications: Current Meds  Medication Sig  . amLODipine (NORVASC) 10 MG  tablet Take 1 tablet (10 mg total) by mouth daily.  . ASPIRIN 81 PO Take by mouth.  Marland Kitchen atorvastatin (LIPITOR) 80 MG tablet Take 1 tablet (80 mg total) by mouth daily at 6 PM.  . carvedilol (COREG) 25 MG tablet Take 1 tablet (25 mg total) by mouth 2 (two) times daily with a meal.  . hydrALAZINE (APRESOLINE) 25 MG tablet Take 50 mg by mouth 3 (three) times daily.   . Insulin Isophane & Regular Human (NOVOLIN 70/30 FLEXPEN) (70-30) 100 UNIT/ML PEN Inject 26 Units into the skin 2 (two) times daily.  Marland Kitchen losartan (COZAAR) 25 MG tablet Take 1 tablet (25 mg total) by mouth daily.  Marland Kitchen METFORMIN HCL PO Take 1,000 mg by mouth 2 (two) times daily.  . methocarbamol (ROBAXIN) 500 MG tablet Take 1 tablet (500 mg total) by mouth 4 (four) times daily as needed for muscle spasms.  . minoxidil (LONITEN) 10 MG tablet Take 1 tablet (10 mg total) by mouth 2 (two) times daily.  . potassium chloride (KLOR-CON) 20 MEQ packet Take 20 mEq by mouth daily.  Marland Kitchen spironolactone (ALDACTONE) 25 MG tablet Take 25 mg by mouth daily.  . Vitamin D, Ergocalciferol, (DRISDOL) 1.25 MG (50000 UNIT) CAPS capsule Take 50,000 Units by mouth every 7 (seven) days.     Allergies:   Zithromax [azithromycin dihydrate] and Kiwi extract   Social History   Socioeconomic History  . Marital status: Married    Spouse name: Not on file  . Number of children: Not on file  . Years of education: Not on file  . Highest education level: Not on file  Occupational History  . Occupation: Engineer, agricultural  Tobacco Use  . Smoking status: Former Smoker    Years: 4.00    Types: Cigars    Quit date: 2010    Years since quitting: 11.5  . Smokeless tobacco: Never Used  Vaping Use  . Vaping Use: Never used  Substance and Sexual Activity  . Alcohol use: No  . Drug use: No  . Sexual activity: Yes  Other Topics Concern  . Not on file  Social History Narrative  . Not on file   Social Determinants of Health   Financial Resource Strain: High Risk    . Difficulty of Paying Living Expenses: Hard  Food Insecurity: Food Insecurity Present  . Worried About Charity fundraiser in the Last Year: Often true  . Ran Out of Food in the Last Year: Often true  Transportation Needs: No Transportation Needs  . Lack of Transportation (Medical): No  . Lack of Transportation (Non-Medical): No  Physical Activity:   . Days of Exercise per Week:   . Minutes of Exercise per Session:   Stress:   . Feeling of Stress :   Social Connections:   . Frequency of Communication with Friends and Family:   . Frequency of Social Gatherings with Friends and Family:   .  Attends Religious Services:   . Active Member of Clubs or Organizations:   . Attends Archivist Meetings:   Marland Kitchen Marital Status:      Family History: The patient's family history includes Diabetes in his mother; Heart disease in his mother; Hypertension in his father and mother; Stroke in his mother.  ROS:   Please see the history of present illness.     All other systems reviewed and are negative.  EKGs/Labs/Other Studies Reviewed:    The following studies were reviewed today:  Echo 02/02/2019 1. Left ventricular ejection fraction, by visual estimation, is 45 to  50%. The left ventricle has mildly decreased function. There is moderately  increased left ventricular hypertrophy.  2. Global right ventricle has normal systolic function.The right  ventricular size is normal.  3. Left atrial size was normal.  4. Right atrial size was normal.  5. Small pericardial effusion.  6. The mitral valve is normal in structure. Trace mitral valve  regurgitation.  7. The tricuspid valve is normal in structure. Tricuspid valve  regurgitation is trivial.  8. The aortic valve is tricuspid. Aortic valve regurgitation is trivial.  9. The pulmonic valve was grossly normal. Pulmonic valve regurgitation is  not visualized.  10. Mild plaque invoving the descending aorta.  11. Mild global  reduction in LV systolic function; LVH; trace AI, MR and  TR; small pericardial effusion; negative saline microcavitation study.   EKG:  EKG is not ordered today.  Recent Labs: 11/27/2018: B Natriuretic Peptide 402.3 01/11/2019: TSH 2.986 01/22/2019: ALT 12 01/24/2019: BUN 17; Creatinine, Ser 1.88; Hemoglobin 14.2; Platelets 271; Potassium 3.6; Sodium 139  Recent Lipid Panel    Component Value Date/Time   CHOL 134 01/12/2019 0339   TRIG 118 01/12/2019 0339   HDL 32 (L) 01/12/2019 0339   CHOLHDL 4.2 01/12/2019 0339   VLDL 24 01/12/2019 0339   LDLCALC 78 01/12/2019 0339    Physical Exam:    VS:  BP (!) 180/120   Pulse 64   Ht 5\' 10"  (1.778 m)   Wt 265 lb (120.2 kg)   SpO2 94%   BMI 38.02 kg/m     Wt Readings from Last 3 Encounters:  08/31/19 265 lb (120.2 kg)  07/05/19 261 lb (118.4 kg)  05/24/19 240 lb (108.9 kg)     GEN:  Well nourished, well developed in no acute distress HEENT: Normal NECK: No JVD; No carotid bruits LYMPHATICS: No lymphadenopathy CARDIAC: RRR, no murmurs, rubs, gallops RESPIRATORY:  Clear to auscultation without rales, wheezing or rhonchi  ABDOMEN: Soft, non-tender, non-distended MUSCULOSKELETAL:  No edema; No deformity  SKIN: Warm and dry NEUROLOGIC:  Alert and oriented x 3 PSYCHIATRIC:  Normal affect   ASSESSMENT:    1. Uncontrolled hypertension   2. Medication management   3. Chronic diastolic heart failure (HCC)   4. Stage 3 chronic kidney disease, unspecified whether stage 3a or 3b CKD   5. Controlled type 2 diabetes mellitus without complication, without [place]io-term current use of insulin ([place])   6. OSA on CPAP   7. Morbid obesity ([place])   8. H/O: CVA (cerebrovascular accident)    PLAN:    In order of problems listed above:  1. Uncontrolled hypertension: Since the previous office visit, he has not been seen by hypertension clinic.  He did have some medication changes with addition of spironolactone and hydralazine.  Hydralazine was  just increased to 50 mg 3 times daily according to the patient roughly 2 days ago.  Since a take some time for the blood pressure to stabilize on the new medication and new doses, I asked him to recheck his blood pressure in 2 weeks and if systolic blood pressure still greater than 140, consider increase hydralazine to 100 mg 3 times daily.  He should see advanced hypertension clinic 2 weeks after that.  2. Chronic diastolic heart failure: EF 45 to 50% on last echocardiogram.  Slightly borderline low ejection fraction related to chronically uncontrolled hypertension.  He appears to be euvolemic on exam, previous diuretic has been switched to spironolactone.  I recommended a basic metabolic panel to make sure his renal function and the electrolyte is stable.  3. Stage III CKD: This is being followed by Dr. Harrie Jeans of [facility]  4. DM2: Managed by primary care provider  5. Obstructive sleep apnea: He is demonstrating better compliance with CPAP therapy recently  6. Morbid obesity: Weight loss imperative  7. History of CVA: Occurred in November 2020, will need to get blood pressure control.  He has not went back to work since December of last year and currently has no job.  I previously gave him a work note because his blood pressure was 240/160 which was stroke level.  At this point his blood pressure is still elevated at 751 systolic, however better than before, I am okay with him to go back to work starting now.   Medication Adjustments/Labs and Tests Ordered: Current medicines are reviewed at length with the patient today.  Concerns regarding medicines are outlined above.  Orders Placed This Encounter  Procedures  . Basic metabolic panel   No orders of the defined types were placed in this encounter.   Patient Instructions  Medication Instructions:   Repeat blood pressure check in 2 weeks if Systolic (top) number is greater than 140 increase Hydralazine to 100 mg 3  times a day *If you need a refill on your cardiac medications before your next appointment, please call your pharmacy*  Lab Work: Your physician recommends that you return for lab work TODAY:   BMET If you have labs (blood work) drawn today and your tests are completely normal, you will receive your results only by: Marland Kitchen MyChart Message (if you have MyChart) OR . A paper copy in the mail If you have any lab test that is abnormal or we need to change your treatment, we will call you to review the results.  Testing/Procedures: NONE ordered at this time of appointment   Follow-Up: At [facility], you and your health needs are our priority.  As part of our continuing mission to provide you with exceptional heart care, we have created designated Provider Care Teams.  These Care Teams include your primary Cardiologist (physician) and Advanced Practice Providers (APPs -  Physician Assistants and Nurse Practitioners) who all work together to provide you with the care you need, when you need it.  We recommend signing up for the patient portal called "MyChart".  Sign up information is provided on this After Visit Summary.  MyChart is used to connect with patients for Virtual Visits (Telemedicine).  Patients are able to view lab/test results, encounter notes, upcoming appointments, etc.  Non-urgent messages can be sent to your provider as well.   To learn more about what you can do with MyChart, go to [email or web address].    Your next appointment:   1 month(s)  The format for your next appointment:   In Person  Provider:   Skeet Latch, MD  Advanced HTN Clinic  Other Instructions      Signed, Almyra Deforest, [place]  09/01/2019 9:02 AM    [place]

## 2019-08-31 NOTE — Telephone Encounter (Signed)
Informed by staff the patient's wife had some question. I tried to call her twice after the clinic, unable to reach her. Voicemail is full.

## 2019-09-01 ENCOUNTER — Encounter: Payer: Self-pay | Admitting: Physician Assistant

## 2019-09-01 ENCOUNTER — Telehealth: Payer: Self-pay

## 2019-09-01 NOTE — Telephone Encounter (Signed)
Called patient to get him scheduled for advance htn clinic I spoke with his wife and got him schedule got august 19th at 9 am with dr. Oval Linsey.

## 2019-09-11 ENCOUNTER — Ambulatory Visit (INDEPENDENT_AMBULATORY_CARE_PROVIDER_SITE_OTHER): Payer: Medicaid Other | Admitting: Cardiovascular Disease

## 2019-09-11 ENCOUNTER — Other Ambulatory Visit: Payer: Self-pay

## 2019-09-11 ENCOUNTER — Encounter: Payer: Self-pay | Admitting: Cardiovascular Disease

## 2019-09-11 VITALS — BP 140/73 | HR 86 | Temp 96.8°F | Ht 71.0 in | Wt 264.0 lb

## 2019-09-11 DIAGNOSIS — E1159 Type 2 diabetes mellitus with other circulatory complications: Secondary | ICD-10-CM

## 2019-09-11 DIAGNOSIS — I152 Hypertension secondary to endocrine disorders: Secondary | ICD-10-CM

## 2019-09-11 DIAGNOSIS — I5032 Chronic diastolic (congestive) heart failure: Secondary | ICD-10-CM | POA: Diagnosis not present

## 2019-09-11 DIAGNOSIS — I639 Cerebral infarction, unspecified: Secondary | ICD-10-CM | POA: Diagnosis not present

## 2019-09-11 DIAGNOSIS — E119 Type 2 diabetes mellitus without complications: Secondary | ICD-10-CM

## 2019-09-11 DIAGNOSIS — I10 Essential (primary) hypertension: Secondary | ICD-10-CM

## 2019-09-11 DIAGNOSIS — G4733 Obstructive sleep apnea (adult) (pediatric): Secondary | ICD-10-CM

## 2019-09-11 MED ORDER — HYDRALAZINE HCL 50 MG PO TABS: 75.0000 mg | ORAL_TABLET | Freq: Three times a day (TID) | ORAL | 3 refills | Status: DC

## 2019-09-11 NOTE — Patient Instructions (Signed)
Medication Instructions:  INCREASE YOUR HYDRALAZINE TO 75MG  (1.5 TABS) EVERY 8 HOURS ( 1.5 TABS 3 TIMES A DAY) *If you need a refill on your cardiac medications before your next appointment, please call your pharmacy*    Follow-Up: At [facility], you and your health needs are our priority.  As part of our continuing mission to provide you with exceptional heart care, we have created designated Provider Care Teams.  These Care Teams include your primary Cardiologist (physician) and Advanced Practice Providers (APPs -  Physician Assistants and Nurse Practitioners) who all work together to provide you with the care you need, when you need it.  We recommend signing up for the patient portal called "MyChart".  Sign up information is provided on this After Visit Summary.  MyChart is used to connect with patients for Virtual Visits (Telemedicine).  Patients are able to view lab/test results, encounter notes, upcoming appointments, etc.  Non-urgent messages can be sent to your provider as well.   To learn more about what you can do with MyChart, go to [email or web address].    Your next appointment:   2 month(s)  The format for your next appointment:   In Person  Provider:   Shelva Majestic, MD

## 2019-09-18 ENCOUNTER — Encounter: Payer: Self-pay | Admitting: Cardiovascular Disease

## 2019-09-18 NOTE — Progress Notes (Signed)
Cardiology Office Note    Date:  09/18/2019   ID:  Harold Clements, DOB 19-Jun-1980, MRN [id number]  PCP:  Kerin Perna, NP  Cardiologist:  Shelva Majestic, MD (sleep); Dr. Orene Desanctis  New sleep evaluation  History of Present Illness:  Harold Clements is a 39 y.o. male who is followed by Dr. Recardo Clements and has a history of chronic diastolic heart failure, hypertension, diabetes mellitus, morbid obesity, remote history of CVA, and stage III kidney disease. He has a history of poor sleep and remotely had tried CPAP with poor compliance. He was recently referred for a sleep study on March 28, 2019 revealed severe obstructive sleep apnea during the diagnostic portion of his split-night protocol with an AHI of 61/h. However events worse since worse with supine sleep with an AHI of 89.4/h and during rem sleep AHI was 96.0/h. During his CPAP titration portion of the split-night protocol CPAP pressure was titrated to 13 cm. Apparently, the patient was later set up with [employer] as his DME company. A download from June 11 through September 09, 2019 shows 22 of 30 days of usage representing 73%. However, average usage was only 4 hours and 2 minutes. AHI was 0.5/h at 13 cm set pressure. He has felt somewhat better since starting therapy but admits that he has not been using it all night. A new Epworth Sleepiness Scale score was calculated in the office today and this endorsed at 7.  Problems have included difficult to control blood pressure and most recently he has been on a multiple regimen consisting of amlodipine 10 mg, carvedilol 25 mg twice a day, hydralazine 50 mg 3 times a day, losartan 25 mg daily, in addition to spironolactone 25 mg and minoxidil 10 mg twice a day. He presents for initial evaluation with me   Past Medical History:  Diagnosis Date  . 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 Diabetes mellitus without complication ([place])   . Ear pain 07/2017  . Hypertension   . Stroke ([place])    . Wasp sting 08/04/2016   of dorsum of left hand near thumb    Past Surgical History:  Procedure Laterality Date  . BUBBLE STUDY  02/02/2019   Procedure: BUBBLE STUDY;  Surgeon: Lelon Perla, MD;  Location: [facility] ENDOSCOPY;  Service: Cardiovascular;;  . TEE WITHOUT CARDIOVERSION N/A 02/02/2019   Procedure: TRANSESOPHAGEAL ECHOCARDIOGRAM (TEE);  Surgeon: Lelon Perla, MD;  Location: [facility] ENDOSCOPY;  Service: Cardiovascular;  Laterality: N/A;    Current Medications: Outpatient Medications Prior to Visit  Medication Sig Dispense Refill  . ASPIRIN 81 PO Take by mouth.    Marland Kitchen atorvastatin (LIPITOR) 80 MG tablet Take 1 tablet (80 mg total) by mouth daily at 6 PM. 90 tablet 0  . carvedilol (COREG) 25 MG tablet Take 1 tablet (25 mg total) by mouth 2 (two) times daily with a meal. 180 tablet 3  . Insulin Isophane & Regular Human (NOVOLIN 70/30 FLEXPEN) (70-30) 100 UNIT/ML PEN Inject 26 Units into the skin 2 (two) times daily. 15 mL 11  . losartan (COZAAR) 25 MG tablet Take 1 tablet (25 mg total) by mouth daily. 90 tablet 3  . METFORMIN HCL PO Take 1,000 mg by mouth 2 (two) times daily.    . methocarbamol (ROBAXIN) 500 MG tablet Take 1 tablet (500 mg total) by mouth 4 (four) times daily as needed for muscle spasms. 20 tablet 0  . minoxidil (LONITEN) 10 MG tablet Take  1 tablet (10 mg total) by mouth 2 (two) times daily. 180 tablet 3  . spironolactone (ALDACTONE) 25 MG tablet Take 25 mg by mouth daily.    . Vitamin D, Ergocalciferol, (DRISDOL) 1.25 MG (50000 UNIT) CAPS capsule Take 50,000 Units by mouth every 7 (seven) days.    . hydrALAZINE (APRESOLINE) 25 MG tablet Take 50 mg by mouth 3 (three) times daily.     Marland Kitchen amLODipine (NORVASC) 10 MG tablet Take 1 tablet (10 mg total) by mouth daily. 90 tablet 1  . potassium chloride (KLOR-CON) 20 MEQ packet Take 20 mEq by mouth daily. 90 packet 1   No facility-administered medications prior to visit.     Allergies:   Zithromax [azithromycin dihydrate]  and Kiwi extract   Social History   Socioeconomic History  . Marital status: Married    Spouse name: Not on file  . Number of children: Not on file  . Years of education: Not on file  . Highest education level: Not on file  Occupational History  . Occupation: Engineer, agricultural  Tobacco Use  . Smoking status: Former Smoker    Years: 4.00    Types: Cigars    Quit date: 2010    Years since quitting: 11.5  . Smokeless tobacco: Never Used  Vaping Use  . Vaping Use: Never used  Substance and Sexual Activity  . Alcohol use: No  . Drug use: No  . Sexual activity: Yes  Other Topics Concern  . Not on file  Social History Narrative  . Not on file   Social Determinants of Health   Financial Resource Strain: High Risk  . Difficulty of Paying Living Expenses: Hard  Food Insecurity: Food Insecurity Present  . Worried About Charity fundraiser in the Last Year: Often true  . Ran Out of Food in the Last Year: Often true  Transportation Needs: No Transportation Needs  . Lack of Transportation (Medical): No  . Lack of Transportation (Non-Medical): No  Physical Activity:   . Days of Exercise per Week:   . Minutes of Exercise per Session:   Stress:   . Feeling of Stress :   Social Connections:   . Frequency of Communication with Friends and Family:   . Frequency of Social Gatherings with Friends and Family:   . Attends Religious Services:   . Active Member of Clubs or Organizations:   . Attends Archivist Meetings:   Marland Kitchen Marital Status:     Socially he has been married for 4 years and has 3 children ages 41, 62, and 5 months.  Family History:  The patient's family history includes Diabetes in his mother; Heart disease in his mother; Hypertension in his father and mother; Stroke in his mother.  His mother died at age 61 with an MI. His father is living at 36 and has hypertension and diabetes mellitus. He has 2 brothers who have hypertension and sleep apnea and one  sister.  ROS General: Negative; No fevers, chills, or night sweats; obesity HEENT: Negative; No changes in vision or hearing, sinus congestion, difficulty swallowing Pulmonary: Negative; No cough, wheezing, shortness of breath, hemoptysis Cardiovascular: Positive for chronic diastolic heart failure, hypertension, GI: Negative; No nausea, vomiting, diarrhea, or abdominal pain GU: Negative; No dysuria, hematuria, or difficulty voiding Musculoskeletal: Negative; no myalgias, joint pain, or weakness Hematologic/Oncology: Negative; no easy bruising, bleeding Endocrine: Negative; no heat/cold intolerance; no diabetes Neuro: Remote CVA Skin: Negative; No rashes or skin lesions Psychiatric: Negative; No  behavioral problems, depression Sleep: Negative; No snoring, daytime sleepiness, hypersomnolence, bruxism, restless legs, hypnogognic hallucinations, no cataplexy Other comprehensive 14 point system review is negative.   PHYSICAL EXAM:   VS:  BP 140/73   Pulse 86   Temp (!) 96.8 F (36 C)   Ht '5\' 11"'  (1.803 m)   Wt 264 lb (119.7 kg)   SpO2 96%   BMI 36.82 kg/m     Repeat blood pressure by me was significantly increased at 180/100.  Wt Readings from Last 3 Encounters:  09/11/19 264 lb (119.7 kg)  08/31/19 265 lb (120.2 kg)  07/05/19 261 lb (118.4 kg)    General: Alert, oriented, no distress. Obese with BMI of 36.8 Skin: normal turgor, no rashes, warm and dry HEENT: Normocephalic, atraumatic. Pupils equal round and reactive to light; sclera anicteric; extraocular muscles intact;  Nose without nasal septal hypertrophy Mouth/Parynx benign; Mallinpatti scale 4 Neck: No JVD, no carotid bruits; normal carotid upstroke Lungs: clear to ausculatation and percussion; no wheezing or rales Chest wall: without tenderness to palpitation Heart: PMI not displaced, RRR, s1 s2 normal, 1/6 systolic murmur, no diastolic murmur, no rubs, gallops, thrills, or heaves Abdomen: soft, nontender; no  hepatosplenomehaly, BS+; abdominal aorta nontender and not dilated by palpation. Back: no CVA tenderness Pulses 2+ Musculoskeletal: full range of motion, normal strength, no joint deformities Extremities: no clubbing cyanosis or edema, Homan's sign negative  Neurologic: grossly nonfocal; Cranial nerves grossly wnl Psychologic: Normal mood and affect   Studies/Labs Reviewed:   EKG:  EKG is not ordered today. 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  Recent Labs: BMP Latest Ref Rng & Units 01/24/2019 01/22/2019 01/20/2019  Glucose 70 - 99 mg/dL 109(H) 121(H) 142(H)  BUN 6 - 20 mg/dL 17 17 25(H)  Creatinine 0.61 - 1.24 mg/dL 1.88(H) 1.91(H) 2.43(H)  BUN/Creat Ratio 9 - 20 - - 10  Sodium 135 - 145 mmol/L 139 141 141  Potassium 3.5 - 5.1 mmol/L 3.6 5.6(H) 4.3  Chloride 98 - 111 mmol/L 107 105 104  CO2 22 - 32 mmol/L '24 26 20  ' Calcium 8.9 - 10.3 mg/dL 9.2 9.4 9.5     Hepatic Function Latest Ref Rng & Units 01/22/2019 01/11/2019 09/28/2013  Total Protein 6.5 - 8.1 g/dL 7.4 8.1 9.0(H)  Albumin 3.5 - 5.0 g/dL 3.8 3.9 4.1  AST 15 - 41 U/L 38 17 16  ALT 0 - 44 U/L '12 13 15  ' Alk Phosphatase 38 - 126 U/L 63 71 67  Total Bilirubin 0.3 - 1.2 mg/dL 1.7(H) 1.3(H) 1.5(H)    CBC Latest Ref Rng & Units 01/24/2019 01/11/2019 01/11/2019  WBC 4.0 - 10.5 K/uL 4.5 - 5.6  Hemoglobin 13.0 - 17.0 g/dL 14.2 15.6 15.4  Hematocrit 39 - 52 % 42.5 46.0 46.1  Platelets 150 - 400 K/uL 271 - 229   Lab Results  Component Value Date   MCV 79.9 (L) 01/24/2019   MCV 80.5 01/11/2019   MCV 80.9 11/29/2018   Lab Results  Component Value Date   TSH 2.986 01/11/2019   Lab Results  Component Value Date   HGBA1C 8.8 (H) 01/12/2019     BNP    Component Value Date/Time   BNP 402.3 (H) 11/27/2018 0504    ProBNP No results found for: PROBNP   Lipid Panel      Component  Value Date/Time   CHOL 134 01/12/2019 0339   TRIG 118 01/12/2019 0339   HDL 32 (L) 01/12/2019 0339   CHOLHDL 4.2 01/12/2019 0339   VLDL 24 01/12/2019 0339   LDLCALC 78 01/12/2019 0339     RADIOLOGY: No results found.   Additional studies/ records that were reviewed today include:   ECHO 02/02/2019 1. Left ventricular ejection fraction, by visual estimation, is 45 to 50%. The left ventricle has mildly decreased function. There is moderately increased left ventricular hypertrophy. 2. Global right ventricle has normal systolic function.The right ventricular size is normal. 3. Left atrial size was normal. 4. Right atrial size was normal. 5. Small pericardial effusion. 6. The mitral valve is normal in structure. Trace mitral valve regurgitation. 7. The tricuspid valve is normal in structure. Tricuspid valve regurgitation is trivial. 8. The aortic valve is tricuspid. Aortic valve regurgitation is trivial. 9. The pulmonic valve was grossly normal. Pulmonic valve regurgitation is not visualized. 10. Mild plaque invoving the descending aorta. 11. Mild global reduction in LV systolic function; LVH   SPLIT NIGHT SLEEP STUDY: 03/28/2019 RESPIRATORY PARAMETERS Diagnostic Total AHI (/hr):            61.0     RDI (/hr):         67.3     OA Index (/hr):            46.9     CA Index (/hr):      4.4 REM AHI (/hr):            96.0     NREM AHI (/hr):          59.5     Supine AHI (/hr):         89.4     Non-supine AHI (/hr):        18.2 Min O2 Sat (%):          85.0     Mean O2 (%):  95.5     Time below 88% (min):           0.8         Titration Optimal Pressure (cm):           13        AHI at Optimal Pressure (/hr):            0.9       Min O2 at Optimal Pressure (%):       96.0 Supine % at Optimal (%):       100      Sleep % at Optimal (%):         99          SLEEP ARCHITECTURE The recording time for the entire night was 397.7 minutes.  During a baseline period of 173.6  minutes, the patient slept for 124.0 minutes in REM and nonREM, yielding a sleep efficiency of 71.4%%. Sleep onset after lights out was 4.6 minutes with a REM latency of 102.5 minutes. The patient spent 55.2%% of the night in stage N1 sleep, 40.7%% in stage N2 sleep, 0.0%% in stage N3 and 4% in REM.  During the titration period of 222.8 minutes, the patient slept for 211.2 minutes in REM and nonREM, yielding a sleep efficiency of 94.8%%. Sleep onset after CPAP initiation was 1.6 minutes with a REM latency of 4.5 minutes. The patient spent 12.9%% of the night in stage N1 sleep, 56.1%% in stage N2 sleep, 0.0%% in stage N3 and  31% in REM.  CARDIAC DATA The 2 lead EKG demonstrated sinus rhythm. The mean heart rate was 100.0 beats per minute. Other EKG findings include: None.  LEG MOVEMENT DATA The total Periodic Limb Movements of Sleep (PLMS) were 0. The PLMS index was 0.0 .  IMPRESSIONS - Severe obstructive sleep apnea occurred during the diagnostic portion of the study (AHI 61.0/h; RDI 67.3/h); events were worse with suopine sleep (AHI 89.4/h) and during REM sleep (AHI 96.0). CPAP was initated at 5 cm and was titrated to optimal PAP pressure at 13 cm of water. - No significant central sleep apnea occurred during the diagnostic portion of the study (CAI = 4.4/hour). - The patient had mild oxygen desaturation during the diagnostic portion of the study to a nadir  of 85.0%. - The patient snored with loud snoring volume during the diagnostic portion of the study. - No cardiac abnormalities were noted during this study. - Clinically significant periodic limb movements did not occur during sleep.  DIAGNOSIS - Obstructive Sleep Apnea (327.23 [G47.33 ICD-10])  RECOMMENDATIONS - Recommend an initial trial of CPAP therapy with EPR at 13 cm H2O with heated humidification.  A Large size Fisher&Paykel Full Face Mask Simplus mask was used for the titration. - Effort should be made to optimize nasal and  oropharyngeal patency. - The patient should be advised to try to avoid supine sleep.  - Avoid alcohol, sedatives and other CNS depressants that may worsen sleep apnea and disrupt normal sleep architecture. - Sleep hygiene should be reviewed to assess factors that may improve sleep quality. - Weight management (BMI 38) and regular exercise should be initiated or continued. - Recommend a download in 30 days and sleep clinic evaluation after 4 weeks of therapy.   ASSESSMENT:    1. OSA (obstructive sleep apnea)   2. Chronic diastolic heart failure ([place])   3. Ischemic stroke ([place])   4. Hypertension associated with diabetes ([place])   5. Morbid obesity ([place])   6. Controlled type 2 diabetes mellitus without complication, without Kass-term current use of insulin [facility])     PLAN:  Harold Clements is a 39 year old gentleman with a history of obesity, hypertension, chronic diastolic heart failure, type 2 diabetes mellitus, as well as prior CVA. He had previously suffered acute infarct involving the right basal ganglia and a small chronic infarction in the left thalamus detected by MR imaging in November 2020. He underwent recent reevaluation for sleep apnea which confirmed very severe obstructive sleep apnea with his overall AHI at 61 events per hour. However in the supine position AHI was 89.4 and during REM sleep 96.0. I had a [place] discussion with him today regarding his sleep study. I discussed the effects of sleep apnea on normal sleep architecture. I also gust at length the cardiovascular ramifications if sleep apnea is untreated. In particular I discussed his severity of sleep apnea during REM sleep and if untreated can play a big role in his difficult to control hypertension. We also discussed that the preponderance of REM sleep occurs during the second half of the night. On his most recent download, his AHI is excellent. However, sleep duration with CPAP use is inadequate. He is compliant with reference  to usage days and has only been averaging 4 hours and 2 minutes of use per night. He typically goes to bed between 10 PM and midnight and wakes up between 7 and 8 AM. I have recommended at minimum 7 to 8 hours of CPAP use. I  discussed the effects of untreated sleep apnea on potential nocturnal arrhythmias, coronary as well as cerebrovascular ischemia in addition to its effects on glucose and GERD. I discussed the importance of weight loss particularly with his BMI of 36.82. His blood pressure today is labile and elevated. I have recommended further titration of hydralazine to 75 mg every 8 hours. He will continue his additional medication but may require more aggressive medication adjustment. He apparently has a office visit with Dr. Oval Linsey in August for evaluation. I gust the importance of meeting compliance standards and the potential risks that if he is noncompliant the insurance company may take away his CPAP unit. I will see him in 2 months for reevaluation.   Medication Adjustments/Labs and Tests Ordered: Current medicines are reviewed at length with the patient today.  Concerns regarding medicines are outlined above.  Medication changes, Labs and Tests ordered today are listed in the Patient Instructions below. Patient Instructions  Medication Instructions:  INCREASE YOUR HYDRALAZINE TO 75MG (1.5 TABS) EVERY 8 HOURS ( 1.5 TABS 3 TIMES A DAY) *If you need a refill on your cardiac medications before your next appointment, please call your pharmacy*    Follow-Up: At [facility], you and your health needs are our priority.  As part of our continuing mission to provide you with exceptional heart care, we have created designated Provider Care Teams.  These Care Teams include your primary Cardiologist (physician) and Advanced Practice Providers (APPs -  Physician Assistants and Nurse Practitioners) who all work together to provide you with the care you need, when you need it.  We recommend  signing up for the patient portal called "MyChart".  Sign up information is provided on this After Visit Summary.  MyChart is used to connect with patients for Virtual Visits (Telemedicine).  Patients are able to view lab/test results, encounter notes, upcoming appointments, etc.  Non-urgent messages can be sent to your provider as well.   To learn more about what you can do with MyChart, go to [email or web address].    Your next appointment:   2 month(s)  The format for your next appointment:   In Person  Provider:   Shelva Majestic, MD        Signed, Shelva Majestic, MD  09/18/2019 6:50 PM    [place] [address] [place], [place], [place]  [address] Phone: [phone]

## 2019-10-13 DIAGNOSIS — N1831 Chronic kidney disease, stage 3a: Secondary | ICD-10-CM | POA: Diagnosis not present

## 2019-10-13 DIAGNOSIS — Z9114 Patient's other noncompliance with medication regimen: Secondary | ICD-10-CM | POA: Diagnosis not present

## 2019-10-13 DIAGNOSIS — I10 Essential (primary) hypertension: Secondary | ICD-10-CM | POA: Diagnosis not present

## 2019-10-13 DIAGNOSIS — R809 Proteinuria, unspecified: Secondary | ICD-10-CM | POA: Diagnosis not present

## 2019-10-13 DIAGNOSIS — E559 Vitamin D deficiency, unspecified: Secondary | ICD-10-CM | POA: Diagnosis not present

## 2019-10-13 DIAGNOSIS — N401 Enlarged prostate with lower urinary tract symptoms: Secondary | ICD-10-CM | POA: Diagnosis not present

## 2019-10-13 DIAGNOSIS — E1122 Type 2 diabetes mellitus with diabetic chronic kidney disease: Secondary | ICD-10-CM | POA: Diagnosis not present

## 2019-10-13 DIAGNOSIS — Z8673 Personal history of transient ischemic attack (TIA), and cerebral infarction without residual deficits: Secondary | ICD-10-CM | POA: Diagnosis not present

## 2019-10-25 ENCOUNTER — Telehealth: Payer: Self-pay | Admitting: *Deleted

## 2019-10-25 NOTE — Telephone Encounter (Signed)
Spoke with patient regarding 11/22/19 12:20 pm appt with Dr. Amado Nash call patient at a later date to reschedule

## 2019-10-26 ENCOUNTER — Ambulatory Visit (INDEPENDENT_AMBULATORY_CARE_PROVIDER_SITE_OTHER): Payer: Medicaid Other | Admitting: Cardiovascular Disease

## 2019-10-26 ENCOUNTER — Other Ambulatory Visit: Payer: Self-pay

## 2019-10-26 ENCOUNTER — Encounter: Payer: Self-pay | Admitting: Cardiovascular Disease

## 2019-10-26 DIAGNOSIS — I10 Essential (primary) hypertension: Secondary | ICD-10-CM | POA: Diagnosis not present

## 2019-10-26 DIAGNOSIS — E1159 Type 2 diabetes mellitus with other circulatory complications: Secondary | ICD-10-CM | POA: Diagnosis not present

## 2019-10-26 MED ORDER — HYDRALAZINE HCL 100 MG PO TABS: 100.0000 mg | ORAL_TABLET | Freq: Three times a day (TID) | ORAL | 3 refills | Status: DC

## 2019-10-26 MED ORDER — AMLODIPINE BESYLATE 10 MG PO TABS: 10.0000 mg | ORAL_TABLET | Freq: Every day | ORAL | 1 refills | Status: DC

## 2019-10-26 NOTE — Progress Notes (Signed)
Hypertension Clinic Initial Assessment:    Date:  10/26/2019   ID:  Harold Clements, DOB 15-Oct-1980, MRN [id number]  PCP:  Kerin Perna, NP  Cardiologist:  Sanda Klein, MD  Nephrologist:  Referring MD: Kerin Perna, NP   CC: Hypertension  History of Present Illness:    Harold Clements is a 39 y.o. male with a hx of chronic diastolic heart failure, hypertension, CKD 3, diabetes, O SA noncompliant with CPAP, morbid obesity, stroke, and medication noncompliance here to establish care in the hypertension clinic. He was initially diagnosed with with hypertension about 10 years ago.  He never really treated it at the time.  He was admitted 01/2019 with a stroke of the right basal ganglia.  Echo at admission revealed LVEF 40 to 45%, moderate LVH, with grade 1 diastolic dysfunction.  Carotid Dopplers were unremarkable.  He is struggled with medication noncompliance.  He saw Almyra Deforest, [place], on 07/2019.  At that time he had not taken several doses of his medications.  His blood pressure was 240/160.  He last saw [place] on 08/2019.  In the interim hydralazine and spironolactone had been added to his medication regimen.  Overall he has been feeling well.  He has no chest pain or shortness of breath.  He denies lower extremity edema, orthopnea, or PND.  He does not get much exercise.  He likes to play with his children who are ages 21 and 15.  He has no exertional chest pain or shortness of breath when he does so.  He denies lower extremity edema, orthopnea, or PND.  For the last month and a half he has been using his CPAP more regularly.  He feels more rested since doing so.  He is trying to work on his diet and limits his salt and sugar intake.  His wife does most of the cooking and they typically eat at home.  He does not have any caffeine, alcohol, or tobacco.  He does not use any supplements or over-the-counter medications.  He reports that he takes all his medications as prescribed.  However he does  sometimes forget to take his evening dose of medicines.  He typically takes his meds at 8 AM and 8 PM.  At home his blood pressure has been in the 170s over 110s.  He does state that he took his medications today.  Past Medical History:  Diagnosis Date  . 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 Diabetes mellitus without complication ([place])   . Ear pain 07/2017  . Hypertension   . Stroke ([place])   . Wasp sting 08/04/2016   of dorsum of left hand near thumb    Past Surgical History:  Procedure Laterality Date  . BUBBLE STUDY  02/02/2019   Procedure: BUBBLE STUDY;  Surgeon: Lelon Perla, MD;  Location: [facility] ENDOSCOPY;  Service: Cardiovascular;;  . TEE WITHOUT CARDIOVERSION N/A 02/02/2019   Procedure: TRANSESOPHAGEAL ECHOCARDIOGRAM (TEE);  Surgeon: Lelon Perla, MD;  Location: [facility] ENDOSCOPY;  Service: Cardiovascular;  Laterality: N/A;    Current Medications: Current Meds  Medication Sig  . amLODipine (NORVASC) 10 MG tablet Take 1 tablet (10 mg total) by mouth daily.  . ASPIRIN 81 PO Take by mouth.  Marland Kitchen atorvastatin (LIPITOR) 80 MG tablet Take 1 tablet (80 mg total) by mouth daily at 6 PM.  . carvedilol (COREG) 25 MG tablet Take 1 tablet (25 mg total) by mouth 2 (two) times daily with  a meal.  . hydrALAZINE (APRESOLINE) 100 MG tablet Take 1 tablet (100 mg total) by mouth 3 (three) times daily.  . Insulin Isophane & Regular Human (NOVOLIN 70/30 FLEXPEN) (70-30) 100 UNIT/ML PEN Inject 26 Units into the skin 2 (two) times daily.  Marland Kitchen losartan (COZAAR) 25 MG tablet Take 1 tablet (25 mg total) by mouth daily.  Marland Kitchen METFORMIN HCL PO Take 1,000 mg by mouth 2 (two) times daily.  . methocarbamol (ROBAXIN) 500 MG tablet Take 1 tablet (500 mg total) by mouth 4 (four) times daily as needed for muscle spasms.  . minoxidil (LONITEN) 10 MG tablet Take 1 tablet (10 mg total) by mouth 2 (two) times daily.  Marland Kitchen spironolactone (ALDACTONE) 25 MG tablet Take 25 mg by mouth daily.  . Vitamin D,  Ergocalciferol, (DRISDOL) 1.25 MG (50000 UNIT) CAPS capsule Take 50,000 Units by mouth every 7 (seven) days.  . [DISCONTINUED] amLODipine (NORVASC) 10 MG tablet Take 1 tablet (10 mg total) by mouth daily.  . [DISCONTINUED] hydrALAZINE (APRESOLINE) 50 MG tablet Take 1.5 tablets (75 mg total) by mouth 3 (three) times daily.     Allergies:   Zithromax [azithromycin dihydrate] and Kiwi extract   Social History   Socioeconomic History  . Marital status: Married    Spouse name: Not on file  . Number of children: Not on file  . Years of education: Not on file  . Highest education level: Not on file  Occupational History  . Occupation: Engineer, agricultural  Tobacco Use  . Smoking status: Former Smoker    Years: 4.00    Types: Cigars    Quit date: 2010    Years since quitting: 11.6  . Smokeless tobacco: Never Used  Vaping Use  . Vaping Use: Never used  Substance and Sexual Activity  . Alcohol use: No  . Drug use: No  . Sexual activity: Yes  Other Topics Concern  . Not on file  Social History Narrative  . Not on file   Social Determinants of Health   Financial Resource Strain: High Risk  . Difficulty of Paying Living Expenses: Hard  Food Insecurity: Food Insecurity Present  . Worried About Charity fundraiser in the Last Year: Often true  . Ran Out of Food in the Last Year: Often true  Transportation Needs: No Transportation Needs  . Lack of Transportation (Medical): No  . Lack of Transportation (Non-Medical): No  Physical Activity:   . Days of Exercise per Week: Not on file  . Minutes of Exercise per Session: Not on file  Stress:   . Feeling of Stress : Not on file  Social Connections:   . Frequency of Communication with Friends and Family: Not on file  . Frequency of Social Gatherings with Friends and Family: Not on file  . Attends Religious Services: Not on file  . Active Member of Clubs or Organizations: Not on file  . Attends Archivist Meetings: Not on file   . Marital Status: Not on file     Family History: The patient's family history includes Diabetes in his mother; Heart attack in his mother; Heart disease in his brother and mother; Hypertension in his father and mother.  ROS:   Please see the history of present illness.    All other systems reviewed and are negative.  EKGs/Labs/Other Studies Reviewed:    EKG:  EKG is not ordered today.    Recent Labs: 11/27/2018: B Natriuretic Peptide 402.3 01/11/2019: TSH 2.986 01/22/2019: ALT  12 01/24/2019: BUN 17; Creatinine, Ser 1.88; Hemoglobin 14.2; Platelets 271; Potassium 3.6; Sodium 139   Recent Lipid Panel    Component Value Date/Time   CHOL 134 01/12/2019 0339   TRIG 118 01/12/2019 0339   HDL 32 (L) 01/12/2019 0339   CHOLHDL 4.2 01/12/2019 0339   VLDL 24 01/12/2019 0339   LDLCALC 78 01/12/2019 0339    Physical Exam:    VS:  BP (!) 212/172   Pulse (!) 102   Ht 5\' 10"  (1.778 m)   Wt 273 lb (123.8 kg)   SpO2 98%   BMI 39.17 kg/m  , BMI Body mass index is 39.17 kg/m. GENERAL:  Well appearing HEENT: Pupils equal round and reactive, fundi not visualized, oral mucosa unremarkable NECK:  No jugular venous distention, waveform within normal limits, carotid upstroke brisk and symmetric, no bruits LUNGS:  Clear to auscultation bilaterally HEART:  RRR.  PMI not displaced or sustained,S1 and S2 within normal limits, no S3, no S4, no clicks, no rubs, no murmurs ABD:  Flat, positive bowel sounds normal in frequency in pitch, no bruits, no rebound, no guarding, no midline pulsatile mass, no hepatomegaly, no splenomegaly EXT:  2 plus pulses throughout, no edema, no cyanosis no clubbing SKIN:  No rashes no nodules NEURO:  Cranial nerves II through XII grossly intact, motor grossly intact throughout PSYCH:  Cognitively intact, oriented to person place and time   ASSESSMENT:    1. 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  His blood pressure is extremely elevated today despite multiple medications.  He does report that he took all his medicines today.  We did call his pharmacy, which is [facility].  It was noted that amlodipine had not been filled since 2020.  Carvedilol, losartan and minoxidil were all filled in May with a 90-day prescription.  Hydralazine has never been filled.  Spironolactone was filled in June with a 30-day prescription.  He reports that he had some old pills that he was taking for amlodipine.  He also had old 25 mg hydralazine tablets.  We did discuss the fact that it is extremely important to be honest with [place] about his medications and why is not taking them, whether it is cost or side effects.  He expressed understanding's.  Given that he is not taking all the medicines that are prescribed will not add anything today.  We will send him a new prescription to amlodipine, as he is quite certain what he has is expired.  He will also call us back when he gets home to let us know if any of his other medications are expired.  He is going to commit to actually taking the medications that are prescribed.  He will take a dose of amlodipine and hydralazine.  We will increase the hydralazine to 100 mg 3 times daily instead of 75, as it is difficult to take so many tablets.  He will commit to trying to take the afternoon dose of hydralazine.  Once his blood pressure is better controlled we will get him enrolled in the PREP program through the [facility].  Secondary Causes of Hypertension  Medications/Herbal: OCP, steroids, stimulants, antidepressants, weight loss medication, immune suppressants, NSAIDs, sympathomimetics, alcohol, caffeine, licorice, ginseng, St. John's wort, chemo (None) Sleep Apnea patient now using his CPAP Renal artery stenosis: negative 2018 Hyperaldosteronism Hyper/hypothyroidism TSH normal 01/2019 Pheochromocytoma: palpitations, tachycardia, headache,  diaphoresis (plasma metanephrines) Cushing's syndrome: Cushingoid facies, central obesity, proximal muscle weakness, and ecchymoses, adrenal incidentaloma (cortisol) Coarctation of the aorta   Disposition:    FU with MD/PharmD in 1 week and 1 month.  Time spent: 40 minutes-Greater than 50% of this time was spent in counseling, explanation of diagnosis, planning of further management, and coordination of care.   Medication Adjustments/Labs and Tests Ordered: Current medicines are reviewed at length with the patient today.  Concerns regarding medicines are outlined above.  No orders of the defined types were placed in this encounter.  Meds ordered this encounter  Medications  . amLODipine (NORVASC) 10 MG tablet    Sig: Take 1 tablet (10 mg total) by mouth daily.    Dispense:  90 tablet    Refill:  1    IM Program  . hydrALAZINE (APRESOLINE) 100 MG tablet    Sig: Take 1 tablet (100 mg total) by mouth 3 (three) times daily.    Dispense:  270 tablet    Refill:  3    New dose d/c previous rx     Signed, Skeet Latch, MD  10/26/2019 12:55 PM    [place]

## 2019-10-26 NOTE — Patient Instructions (Addendum)
Medication Instructions:  INCREASE YOUR HYDRALAZINE TO 100 MG THREE TIMES A DAY   GO TO THE PHARMACY NOW AND GET YOUR AMLODIPINE AND HYDRALAZINE. TAKE AS SOON AS YOU PICK UP    Labwork: NONE   Testing/Procedures: NONE   Follow-Up: TELEPHONE VISIT 11/01/2019 AT 1:00 PM WITH DR [facility]  11/28/2019 AT 10:00 AM WITH PHARM D    Special Instructions:   WHEN YOU GET HOME CALL THE OFFICE TO GO OVER ALL THE MEDICATION BOTTLES YOU HAVE AND ARE TAKING  YOU NEED TO GET A PILL BOX AND FILL FOR EACH DAY  MONITOR YOUR BLOOD PRESSURE DAILY, LOG IN BOOK PROVIDED. BRING BOOK AND MACHINE TO FOLLOW UP   DASH Eating Plan DASH stands for "Dietary Approaches to Stop Hypertension." The DASH eating plan is a healthy eating plan that has been shown to reduce high blood pressure (hypertension). It may also reduce your risk for type 2 diabetes, heart disease, and stroke. The DASH eating plan may also help with weight loss. What are tips for following this plan?  General guidelines  Avoid eating more than 2,300 mg (milligrams) of salt (sodium) a day. If you have hypertension, you may need to reduce your sodium intake to 1,500 mg a day.  Limit alcohol intake to no more than 1 drink a day for nonpregnant women and 2 drinks a day for men. One drink equals 12 oz of beer, 5 oz of wine, or 1 oz of hard liquor.  Work with your health care provider to maintain a healthy body weight or to lose weight. Ask what an ideal weight is for you.  Get at least 30 minutes of exercise that causes your heart to beat faster (aerobic exercise) most days of the week. Activities may include walking, swimming, or biking.  Work with your health care provider or diet and nutrition specialist (dietitian) to adjust your eating plan to your individual calorie needs. Reading food labels   Check food labels for the amount of sodium per serving. Choose foods with less than 5 percent of the Daily Value of sodium. Generally, foods with  less than 300 mg of sodium per serving fit into this eating plan.  To find whole grains, look for the word "whole" as the first word in the ingredient list. Shopping  Buy products labeled as "low-sodium" or "no salt added."  Buy fresh foods. Avoid canned foods and premade or frozen meals. Cooking  Avoid adding salt when cooking. Use salt-free seasonings or herbs instead of table salt or sea salt. Check with your health care provider or pharmacist before using salt substitutes.  Do not fry foods. Cook foods using healthy methods such as baking, boiling, grilling, and broiling instead.  Cook with heart-healthy oils, such as olive, canola, soybean, or sunflower oil. Meal planning  Eat a balanced diet that includes: ? 5 or more servings of fruits and vegetables each day. At each meal, try to fill half of your plate with fruits and vegetables. ? Up to 6-8 servings of whole grains each day. ? Less than 6 oz of lean meat, poultry, or fish each day. A 3-oz serving of meat is about the same size as a deck of cards. One egg equals 1 oz. ? 2 servings of low-fat dairy each day. ? A serving of nuts, seeds, or beans 5 times each week. ? Heart-healthy fats. Healthy fats called Omega-3 fatty acids are found in foods such as flaxseeds and coldwater fish, like sardines, salmon, and mackerel.  Limit how much you eat of the following: ? Canned or prepackaged foods. ? Food that is high in trans fat, such as fried foods. ? Food that is high in saturated fat, such as fatty meat. ? Sweets, desserts, sugary drinks, and other foods with added sugar. ? Full-fat dairy products.  Do not salt foods before eating.  Try to eat at least 2 vegetarian meals each week.  Eat more home-cooked food and less restaurant, buffet, and fast food.  When eating at a restaurant, ask that your food be prepared with less salt or no salt, if possible. What foods are recommended? The items listed may not be a complete list.  Talk with your dietitian about what dietary choices are best for you. Grains Whole-grain or whole-wheat bread. Whole-grain or whole-wheat pasta. Brown rice. [place]ena Morrow. Bulgur. Whole-grain and low-sodium cereals. Pita bread. Low-fat, low-sodium crackers. Whole-wheat flour tortillas. Vegetables Fresh or frozen vegetables (raw, steamed, roasted, or grilled). Low-sodium or reduced-sodium tomato and vegetable juice. Low-sodium or reduced-sodium tomato sauce and tomato paste. Low-sodium or reduced-sodium canned vegetables. Fruits All fresh, dried, or frozen fruit. Canned fruit in natural juice (without added sugar). Meat and other protein foods Skinless chicken or [place]. Ground chicken or [place]. Pork with fat trimmed off. Fish and seafood. Egg whites. Dried beans, peas, or lentils. Unsalted nuts, nut butters, and seeds. Unsalted canned beans. Lean cuts of beef with fat trimmed off. Low-sodium, lean deli meat. Dairy Low-fat (1%) or fat-free (skim) milk. Fat-free, low-fat, or reduced-fat cheeses. Nonfat, low-sodium ricotta or cottage cheese. Low-fat or nonfat yogurt. Low-fat, low-sodium cheese. Fats and oils Soft margarine without trans fats. Vegetable oil. Low-fat, reduced-fat, or light mayonnaise and salad dressings (reduced-sodium). Canola, safflower, olive, soybean, and sunflower oils. Avocado. Seasoning and other foods Herbs. Spices. Seasoning mixes without salt. Unsalted popcorn and pretzels. Fat-free sweets. What foods are not recommended? The items listed may not be a complete list. Talk with your dietitian about what dietary choices are best for you. Grains Baked goods made with fat, such as croissants, muffins, or some breads. Dry pasta or rice meal packs. Vegetables Creamed or fried vegetables. Vegetables in a cheese sauce. Regular canned vegetables (not low-sodium or reduced-sodium). Regular canned tomato sauce and paste (not low-sodium or reduced-sodium). Regular tomato and vegetable  juice (not low-sodium or reduced-sodium). Angie Fava. Olives. Fruits Canned fruit in a light or heavy syrup. Fried fruit. Fruit in cream or butter sauce. Meat and other protein foods Fatty cuts of meat. Ribs. Fried meat. Berniece Salines. Sausage. Bologna and other processed lunch meats. Salami. Fatback. Hotdogs. Bratwurst. Salted nuts and seeds. Canned beans with added salt. Canned or smoked fish. Whole eggs or egg yolks. Chicken or [place] with skin. Dairy Whole or 2% milk, cream, and half-and-half. Whole or full-fat cream cheese. Whole-fat or sweetened yogurt. Full-fat cheese. Nondairy creamers. Whipped toppings. Processed cheese and cheese spreads. Fats and oils Butter. Stick margarine. Lard. Shortening. Ghee. Bacon fat. Tropical oils, such as coconut, palm kernel, or palm oil. Seasoning and other foods Salted popcorn and pretzels. Onion salt, garlic salt, seasoned salt, table salt, and sea salt. Worcestershire sauce. Tartar sauce. Barbecue sauce. Teriyaki sauce. Soy sauce, including reduced-sodium. Steak sauce. Canned and packaged gravies. Fish sauce. Oyster sauce. Cocktail sauce. Horseradish that you find on the shelf. Ketchup. Mustard. Meat flavorings and tenderizers. Bouillon cubes. Hot sauce and Tabasco sauce. Premade or packaged marinades. Premade or packaged taco seasonings. Relishes. Regular salad dressings. Where to find more information:  National Heart, Lung, and Blood  Institute: https://wilson-eaton.com/  American Heart Association: www.heart.org Summary  The DASH eating plan is a healthy eating plan that has been shown to reduce high blood pressure (hypertension). It may also reduce your risk for type 2 diabetes, heart disease, and stroke.  With the DASH eating plan, you should limit salt (sodium) intake to 2,300 mg a day. If you have hypertension, you may need to reduce your sodium intake to 1,500 mg a day.  When on the DASH eating plan, aim to eat more fresh fruits and vegetables, whole grains,  lean proteins, low-fat dairy, and heart-healthy fats.  Work with your health care provider or diet and nutrition specialist (dietitian) to adjust your eating plan to your individual calorie needs. This information is not intended to replace advice given to you by your health care provider. Make sure you discuss any questions you have with your health care provider. Document Released: 02/05/2011 Document Revised: 01/29/2017 Document Reviewed: 02/10/2016 Elsevier Patient Education  2020 [employer].

## 2019-11-16 ENCOUNTER — Ambulatory Visit: Payer: Medicaid Other | Admitting: Cardiovascular Disease

## 2019-11-16 DIAGNOSIS — G4733 Obstructive sleep apnea (adult) (pediatric): Secondary | ICD-10-CM | POA: Diagnosis not present

## 2019-11-22 ENCOUNTER — Ambulatory Visit: Payer: Medicaid Other | Admitting: Cardiovascular Disease

## 2019-11-28 ENCOUNTER — Ambulatory Visit (INDEPENDENT_AMBULATORY_CARE_PROVIDER_SITE_OTHER): Payer: Medicaid Other | Admitting: Pharmacist

## 2019-11-28 ENCOUNTER — Other Ambulatory Visit: Payer: Self-pay

## 2019-11-28 DIAGNOSIS — I10 Essential (primary) hypertension: Secondary | ICD-10-CM

## 2019-11-28 DIAGNOSIS — E1159 Type 2 diabetes mellitus with other circulatory complications: Secondary | ICD-10-CM | POA: Diagnosis not present

## 2019-11-28 MED ORDER — MINOXIDIL 10 MG PO TABS: 10.0000 mg | ORAL_TABLET | Freq: Three times a day (TID) | ORAL | 1 refills | Status: DC

## 2019-11-28 MED ORDER — SPIRONOLACTONE 25 MG PO TABS: 25.0000 mg | ORAL_TABLET | Freq: Every day | ORAL | 1 refills | Status: DC

## 2019-11-28 MED ORDER — VALSARTAN 40 MG PO TABS: 40.0000 mg | ORAL_TABLET | Freq: Every day | ORAL | 1 refills | Status: DC

## 2019-11-28 MED ORDER — HYDRALAZINE HCL 100 MG PO TABS: 100.0000 mg | ORAL_TABLET | Freq: Three times a day (TID) | ORAL | 3 refills | Status: DC

## 2019-11-28 MED ORDER — CARVEDILOL 25 MG PO TABS: 25.0000 mg | ORAL_TABLET | Freq: Two times a day (BID) | ORAL | 3 refills | Status: DC

## 2019-11-28 MED ORDER — ATORVASTATIN CALCIUM 80 MG PO TABS: 80.0000 mg | ORAL_TABLET | Freq: Every day | ORAL | 0 refills | Status: DC

## 2019-11-28 MED ORDER — LOSARTAN POTASSIUM 25 MG PO TABS: 25.0000 mg | ORAL_TABLET | Freq: Every day | ORAL | 3 refills | Status: DC

## 2019-11-28 MED ORDER — AMLODIPINE BESYLATE 10 MG PO TABS: 10.0000 mg | ORAL_TABLET | Freq: Every day | ORAL | 1 refills | Status: DC

## 2019-11-28 NOTE — Progress Notes (Signed)
Patient ID: Harold Clements                 DOB: 1980-10-16                      MRN: [id number]     HPI: Harold Clements is a 39 y.o. male referred by Dr. Oval Linsey to HTN clinic. PMH includes hx of chronic diastolic heart failure, hypertension, CKD 3, diabetes, OSA noncompliant with CPAP, stroke, and medication noncompliance.  Patient reports   Current HTN meds:  Amlodipine 10mg  daily  Carvedilol 25mg  twice daily Hydralazine 100mg  TID Losartan 25mg  daily Minoxidil 10mg  twice daily Spironolactone 25mg  daily  Previously tried:   BP goal: 130/80 (<140/90 if unable to tolerate)  Family History: The patient's family history includes Diabetes in his mother; Heart attack in his mother; Heart disease in his brother and mother; Hypertension in his father and mother. Stroke in father  but he still alive.  Social History: former smoker, never used smokeless tobacco, and denies alcohol use.  Diet: avoid adding salt to diet and mainly homecooked meals, but works at [employer] and eats there sometime for lunch.  Exercise: activities of daily living  Home BP readings: none provided  Wt Readings from Last 3 Encounters:  11/28/19 271 lb 3.2 oz (123 kg)  10/26/19 273 lb (123.8 kg)  09/11/19 264 lb (119.7 kg)   BP Readings from Last 3 Encounters:  11/28/19 (!) 200/130  10/26/19 (!) 212/172  09/11/19 140/73   Pulse Readings from Last 3 Encounters:  11/28/19 94  10/26/19 (!) 102  09/11/19 86    Renal function: Estimated Creatinine Clearance: 68.9 mL/min (A) (by C-G formula based on SCr of 1.98 mg/dL (H)).  Past Medical History:  Diagnosis Date  . 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 Diabetes mellitus without complication ([place])   . Ear pain 07/2017  . Hypertension   . Stroke ([place])   . Wasp sting 08/04/2016   of dorsum of left hand near thumb    Current Outpatient Medications on File Prior to Visit  Medication Sig Dispense Refill  . ASPIRIN 81 PO Take by  mouth.    . Insulin Isophane & Regular Human (NOVOLIN 70/30 FLEXPEN) (70-30) 100 UNIT/ML PEN Inject 26 Units into the skin 2 (two) times daily. 15 mL 11  . METFORMIN HCL PO Take 1,000 mg by mouth 2 (two) times daily.    . Vitamin D, Ergocalciferol, (DRISDOL) 1.25 MG (50000 UNIT) CAPS capsule Take 50,000 Units by mouth every 7 (seven) days.     No current facility-administered medications on file prior to visit.    Allergies  Allergen Reactions  . Zithromax [Azithromycin Dihydrate] Anaphylaxis  . Kiwi Extract Other (See Comments)    Tongue goes numb    Blood pressure (!) 200/130, pulse 94, resp. rate 14, height 5\' 11"  (1.803 m), weight 271 lb 3.2 oz (123 kg), SpO2 99 %.  Hypertension associated with diabetes ([place]) Blood pressure remains extremely above goal, and patient continues to claming compliance with all medication. We called his pharmacy and Spironolactone was refilled 3 months ago for only 30 day supply. Minoxidil was last refilled in May/2021 for 90 days and losartan was refilled for 90 days on 07/05/2019.  Patient also reports memory issues since his stroke and is not taking any medication at bedtime.   I sent refills of all his BP medication to his prefer pharmacy, increased minoxidil to  10mg  TID, and space TID doses, for minoxidil and hydralazine, to 8am, 3pm and 10pm. Will follow up at the clinic in 2 weeks and repeat BMET today.   Darchelle Nunes Rodriguez-Guzman PharmD, BCPS, [place] [address] [place] [address] 12/04/2019 8:39 PM

## 2019-11-28 NOTE — Patient Instructions (Addendum)
Return for a  follow up appointment in 2 weeks  Go to the lab in [place] your blood pressure at home daily (if able) and keep record of the readings.  Take your BP meds as follows: *STOP taking losartan *START taking valsartan 40mg  daily *INCREASE minoxidil to 10mg  three times daily* *CONTINUE all other medication as prescribed*  *TAKE hydralazine and minoxidil at 8am , 3pm and 10pm*  Bring all of your meds, your BP cuff and your record of home blood pressures to your next appointment.  Exercise as you're able, try to walk approximately 30 minutes per day.  Keep salt intake to a minimum, especially watch canned and prepared boxed foods.  Eat more fresh fruits and vegetables and fewer canned items.  Avoid eating in fast food restaurants.    HOW TO TAKE YOUR BLOOD PRESSURE: . Rest 5 minutes before taking your blood pressure. .  Don't smoke or drink caffeinated beverages for at least 30 minutes before. . Take your blood pressure before (not after) you eat. . Sit comfortably with your back supported and both feet on the floor (don't cross your legs). . Elevate your arm to heart level on a table or a desk. . Use the proper sized cuff. It should fit smoothly and snugly around your bare upper arm. There should be enough room to slip a fingertip under the cuff. The bottom edge of the cuff should be 1 inch above the crease of the elbow. . Ideally, take 3 measurements at one sitting and record the average.

## 2019-11-29 LAB — CBC
Hematocrit: 43.2 % (ref 37.5–51.0)
Hemoglobin: 14.3 g/dL (ref 13.0–17.7)
MCH: 27 pg (ref 26.6–33.0)
MCHC: 33.1 g/dL (ref 31.5–35.7)
MCV: 82 fL (ref 79–97)
Platelets: 234 10*3/uL (ref 150–450)
RBC: 5.3 x10E6/uL (ref 4.14–5.80)
RDW: 14 % (ref 11.6–15.4)
WBC: 5.7 10*3/uL (ref 3.4–10.8)

## 2019-11-29 LAB — BASIC METABOLIC PANEL
BUN/Creatinine Ratio: 11 (ref 9–20)
BUN: 21 mg/dL — ABNORMAL HIGH (ref 6–20)
CO2: 19 mmol/L — ABNORMAL LOW (ref 20–29)
Calcium: 9.1 mg/dL (ref 8.7–10.2)
Chloride: 99 mmol/L (ref 96–106)
Creatinine, Ser: 1.98 mg/dL — ABNORMAL HIGH (ref 0.76–1.27)
GFR calc Af Amer: 49 mL/min/{1.73_m2} — ABNORMAL LOW (ref >59)
GFR calc non Af Amer: 42 mL/min/{1.73_m2} — ABNORMAL LOW (ref >59)
Glucose: 353 mg/dL — ABNORMAL HIGH (ref 65–99)
Potassium: 4 mmol/L (ref 3.5–5.2)
Sodium: 135 mmol/L (ref 134–144)

## 2019-11-29 LAB — HEMOGLOBIN A1C
Est. average glucose Bld gHb Est-mCnc: 223 mg/dL
Hgb A1c MFr Bld: 9.4 % — ABNORMAL HIGH (ref 4.8–5.6)

## 2019-12-04 ENCOUNTER — Encounter: Payer: Self-pay | Admitting: Pharmacist

## 2019-12-04 NOTE — Assessment & Plan Note (Signed)
Blood pressure remains extremely above goal, and patient continues to claming compliance with all medication. We called his pharmacy and Spironolactone was refilled 3 months ago for only 30 day supply. Minoxidil was last refilled in May/2021 for 90 days and losartan was refilled for 90 days on 07/05/2019.  Patient also reports memory issues since his stroke and is not taking any medication at bedtime.   I sent refills of all his BP medication to his prefer pharmacy, increased minoxidil to 10mg  TID, and space TID doses, for minoxidil and hydralazine, to 8am, 3pm and 10pm. Will follow up at the clinic in 2 weeks and repeat BMET today.

## 2019-12-13 ENCOUNTER — Ambulatory Visit (INDEPENDENT_AMBULATORY_CARE_PROVIDER_SITE_OTHER): Payer: Medicaid Other | Admitting: Pharmacist Clinician (PhC)/ Clinical Pharmacy Specialist

## 2019-12-13 ENCOUNTER — Other Ambulatory Visit: Payer: Self-pay

## 2019-12-13 DIAGNOSIS — E1159 Type 2 diabetes mellitus with other circulatory complications: Secondary | ICD-10-CM

## 2019-12-13 DIAGNOSIS — I110 Hypertensive heart disease with heart failure: Secondary | ICD-10-CM

## 2019-12-13 DIAGNOSIS — I152 Hypertension secondary to endocrine disorders: Secondary | ICD-10-CM

## 2019-12-13 MED ORDER — HYDRALAZINE HCL 100 MG PO TABS: 100.0000 mg | ORAL_TABLET | Freq: Two times a day (BID) | ORAL | 1 refills | Status: DC

## 2019-12-13 MED ORDER — AMLODIPINE BESYLATE 10 MG PO TABS: 10.0000 mg | ORAL_TABLET | Freq: Every day | ORAL | 1 refills | Status: DC

## 2019-12-13 MED ORDER — SPIRONOLACTONE 25 MG PO TABS: 25.0000 mg | ORAL_TABLET | Freq: Every day | ORAL | 1 refills | Status: DC

## 2019-12-13 MED ORDER — CARVEDILOL 25 MG PO TABS: 25.0000 mg | ORAL_TABLET | Freq: Two times a day (BID) | ORAL | 1 refills | Status: DC

## 2019-12-13 MED ORDER — MINOXIDIL 10 MG PO TABS: 10.0000 mg | ORAL_TABLET | Freq: Two times a day (BID) | ORAL | 1 refills | Status: DC

## 2019-12-13 MED ORDER — VALSARTAN 40 MG PO TABS: 40.0000 mg | ORAL_TABLET | Freq: Every day | ORAL | 1 refills | Status: DC

## 2019-12-13 NOTE — Progress Notes (Signed)
Patient ID: Harold Clements                 DOB: 28-Jun-1980                      MRN: [id number]     HPI: Harold Clements is a 39 y.o. male referred by Dr. Oval Linsey to HTN clinic. PMH includes hx of chronic diastolic heart failure, hypertension, CKD 3 (GFR 49), diabetes (A1c 9.4) , OSA (states now compliant with CPAP), stroke, and medication noncompliance.  He was seen by Dr. Oval Linsey in August, who noted that there were concerns with his picking up medications in a timely manner.  At his first PharmD follow up, his pressure was still elevated at 200/130 and patient admitted to some memory issues due to prior stroke, but was trying to take medications regularly.    Today he returns for his second follow up.  After a short conversation, patient admitted that he has trouble finding money to pay for medications on a regular basis, and he hasn't had a few for awhile.  He has a brother-in-law who just recently died, leaving 3 children, due to CKD, perhaps due in part to uncontrolled hypertension.   Patient knows he needs to have better control, but needs some help to get this done.   States now using CPAP and sleeping better, but still has difficulty going to sleep and is often up until 12-1 am, then has to get up by 7 am to help get the kids off to school.    Current HTN meds:  Amlodipine 10mg  daily  Carvedilol 25mg  twice daily Hydralazine 100mg  TID Valsartan 40 mg qd Minoxidil 10mg  twice daily Spironolactone 25mg  daily  Previously tried:   BP goal: 130/80 (<140/90 if unable to tolerate)  Family History: The patient's family history includes Diabetes in his mother; Heart attack in his mother; Heart disease in his brother and mother; Hypertension in his father and mother. Stroke in father  but he still alive. Kids 9, 7  Social History: former smoker, never used smokeless tobacco, and denies alcohol use, drinking some Coke  Diet: avoid adding salt to diet and mainly homecooked meals, but works at [employer] and eats there sometime for lunch.  Exercise: activities of daily living  Home BP readings: none provided  Labs:  11/28/19:  Na 135, K 4.0, Glu 353, BUN 21, SCr 1.98 GFR 49  Wt Readings from Last 3 Encounters:  12/13/19 271 lb 12.8 oz (123.3 kg)  11/28/19 271 lb 3.2 oz (123 kg)  10/26/19 273 lb (123.8 kg)   BP Readings from Last 3 Encounters:  12/13/19 (!) 202/160  11/28/19 (!) 200/130  10/26/19 (!) 212/172   Pulse Readings from Last 3 Encounters:  12/13/19 100  11/28/19 94  10/26/19 (!) 102    Renal function: Estimated Creatinine Clearance: 68.3 mL/min (A) (by C-G formula based on SCr of 1.98 mg/dL (H)).  Past Medical History:  Diagnosis Date  . 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 Diabetes mellitus without complication ([place])   . Ear pain 07/2017  . Hypertension   . Stroke ([place])   . Wasp sting 08/04/2016   of dorsum of left hand near thumb    Current Outpatient Medications on File Prior to Visit  Medication Sig Dispense Refill  . ASPIRIN 81 PO Take by mouth.    Marland Kitchen atorvastatin (LIPITOR) 80 MG tablet Take 1 tablet (80 mg  total) by mouth daily at 6 PM. 90 tablet 0  . Insulin Isophane & Regular Human (NOVOLIN 70/30 FLEXPEN) (70-30) 100 UNIT/ML PEN Inject 26 Units into the skin 2 (two) times daily. 15 mL 11  . METFORMIN HCL PO Take 1,000 mg by mouth 2 (two) times daily.    . Vitamin D, Ergocalciferol, (DRISDOL) 1.25 MG (50000 UNIT) CAPS capsule Take 50,000 Units by mouth every 7 (seven) days.     No current facility-administered medications on file prior to visit.    Allergies  Allergen Reactions  . Zithromax [Azithromycin Dihydrate] Anaphylaxis  . Kiwi Extract Other (See Comments)    Tongue goes numb    Blood pressure (!) 202/160, pulse 100, resp. rate 17, height 5\' 11"  (1.803 m), weight 271 lb 12.8 oz (123.3 kg), SpO2 97 %.  Hypertensive heart disease Patient with uncontrolled hypertension, due in part to financial issues making  it difficult to pay for medications regularly.   We had a discussion about getting him in touch with the social worker after his next visit, to see what they can do to help him.  In the meantime, I made a simplified table of his medications and when to take them.  For now I have cut out tid dosing back to just bid on the hydralazine and minoxidil.  While we may need those doses eventually, I want to keep everything simple and see what we get after 2-3 weeks of taking meds regularly.  He was also given an AM/PM 7 day pill minder to make things easier.   All six prescriptions were re-sent to [facility] for a 30 day supply with updated directions, and he was given a $25 gift card to pick everything up.   We will see him back in 3 weeks.  He is going to try and carve out 5 minutes each morning to check his blood pressure.  Lastly, he was encouraged to bring his wife with him to next appointment, for an extra set of ears to help understand   Medicine Morning Evening  Amlodipine 10 mg  X  Carvedilol 25 mg  X X  Hydralazine 100 mg  X X  Valsartan 40 mg X   Minoxidil 10 mg  X X  Spironolactone 25 mg X     Tommy Medal PharmD CPP [facility] [place] [id number] 12/13/2019 4:16 PM

## 2019-12-13 NOTE — Patient Instructions (Addendum)
Return for a a follow up appointment November 4 at 3:30 pm  Check your blood pressure at home daily and keep record of the readings.  Take your BP meds as follows:  Follow the schedule attached.  I have cut out the middle day dose of hydralazine for now, we can determine later if it is still needed.  Bring all of your meds, your BP cuff and your record of home blood pressures to your next appointment.  Exercise as you're able, try to walk approximately 30 minutes per day.  Keep salt intake to a minimum, especially watch canned and prepared boxed foods.  Eat more fresh fruits and vegetables and fewer canned items.  Avoid eating in fast food restaurants.    HOW TO TAKE YOUR BLOOD PRESSURE: . Rest 5 minutes before taking your blood pressure. .  Don't smoke or drink caffeinated beverages for at least 30 minutes before. . Take your blood pressure before (not after) you eat. . Sit comfortably with your back supported and both feet on the floor (don't cross your legs). . Elevate your arm to heart level on a table or a desk. . Use the proper sized cuff. It should fit smoothly and snugly around your bare upper arm. There should be enough room to slip a fingertip under the cuff. The bottom edge of the cuff should be 1 inch above the crease of the elbow. . Ideally, take 3 measurements at one sitting and record the average.

## 2019-12-13 NOTE — Assessment & Plan Note (Addendum)
Patient with uncontrolled hypertension, due in part to financial issues making it difficult to pay for medications regularly.   We had a discussion about getting him in touch with the social worker after his next visit, to see what they can do to help him.  In the meantime, I made a simplified table of his medications and when to take them.  For now I have cut out tid dosing back to just bid on the hydralazine and minoxidil.  While we may need those doses eventually, I want to keep everything simple and see what we get after 2-3 weeks of taking meds regularly.  He was also given an AM/PM 7 day pill minder to make things easier.   All six prescriptions were re-sent to [facility] for a 30 day supply with updated directions, and he was given a $25 gift card to pick everything up.   We will see him back in 3 weeks.  He is going to try and carve out 5 minutes each morning to check his blood pressure.  Lastly, he was encouraged to bring his wife with him to next appointment, for an extra set of ears to help understand

## 2020-01-04 ENCOUNTER — Ambulatory Visit: Payer: Medicaid Other

## 2020-01-04 NOTE — Progress Notes (Deleted)
Patient ID: CICERO NOY                 DOB: Oct 22, 1980                      MRN: [id number]     HPI: Harold Clements is a 39 y.o. male referred by Dr. Oval Linsey to HTN clinic. PMH includes hx of chronic diastolic heart failure, hypertension, CKD 3 (GFR 49), diabetes (A1c 9.4) , OSA (states now compliant with CPAP), stroke, and medication noncompliance.  He was seen by Dr. Oval Linsey in August, who noted that there were concerns with his picking up medications in a timely manner.  At his first PharmD follow up, his pressure was still elevated at 200/130 and patient admitted to some memory issues due to prior stroke, but was trying to take medications regularly.    Today he returns for his second follow up.  After a short conversation, patient admitted that he has trouble finding money to pay for medications on a regular basis, and he hasn't had a few for awhile.  He has a brother-in-law who just recently died, leaving 3 children, due to CKD, perhaps due in part to uncontrolled hypertension.   Patient knows he needs to have better control, but needs some help to get this done.   States now using CPAP and sleeping better, but still has difficulty going to sleep and is often up until 12-1 am, then has to get up by 7 am to help get the kids off to school.    Current HTN meds:  Amlodipine 10mg  daily  Carvedilol 25mg  twice daily Hydralazine 100mg  TID Valsartan 40 mg qd Minoxidil 10mg  twice daily Spironolactone 25mg  daily  BP goal: 130/80 (<140/90 if unable to tolerate)  Family History: The patient's family history includes Diabetes in his mother; Heart attack in his mother; Heart disease in his brother and mother; Hypertension in his father and mother. Stroke in father  but he still alive. Kids 9, 7  Social History: former smoker, never used smokeless tobacco, and denies alcohol use, drinking some Coke  Diet: avoid adding salt to diet and mainly homecooked meals, but works at [employer] and eats  there sometime for lunch.  Exercise: activities of daily living  Home BP readings: none provided Labs:  11/28/19:  Na 135, K 4.0, Glu 353, BUN 21, SCr 1.98 GFR 49  Wt Readings from Last 3 Encounters:  12/13/19 271 lb 12.8 oz (123.3 kg)  11/28/19 271 lb 3.2 oz (123 kg)  10/26/19 273 lb (123.8 kg)   BP Readings from Last 3 Encounters:  12/13/19 (!) 202/160  11/28/19 (!) 200/130  10/26/19 (!) 212/172   Pulse Readings from Last 3 Encounters:  12/13/19 100  11/28/19 94  10/26/19 (!) 102    Past Medical History:  Diagnosis Date  . 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 Diabetes mellitus without complication ([place])   . Ear pain 07/2017  . Hypertension   . Stroke ([place])   . Wasp sting 08/04/2016   of dorsum of left hand near thumb    Current Outpatient Medications on File Prior to Visit  Medication Sig Dispense Refill  . amLODipine (NORVASC) 10 MG tablet Take 1 tablet (10 mg total) by mouth daily. 30 tablet 1  . ASPIRIN 81 PO Take by mouth.    Marland Kitchen atorvastatin (LIPITOR) 80 MG tablet Take 1 tablet (80 mg total) by mouth daily at  6 PM. 90 tablet 0  . carvedilol (COREG) 25 MG tablet Take 1 tablet (25 mg total) by mouth 2 (two) times daily with a meal. 60 tablet 1  . hydrALAZINE (APRESOLINE) 100 MG tablet Take 1 tablet (100 mg total) by mouth 2 (two) times daily. 60 tablet 1  . Insulin Isophane & Regular Human (NOVOLIN 70/30 FLEXPEN) (70-30) 100 UNIT/ML PEN Inject 26 Units into the skin 2 (two) times daily. 15 mL 11  . METFORMIN HCL PO Take 1,000 mg by mouth 2 (two) times daily.    . minoxidil (LONITEN) 10 MG tablet Take 1 tablet (10 mg total) by mouth 2 (two) times daily. 60 tablet 1  . spironolactone (ALDACTONE) 25 MG tablet Take 1 tablet (25 mg total) by mouth daily. 30 tablet 1  . valsartan (DIOVAN) 40 MG tablet Take 1 tablet (40 mg total) by mouth daily. 30 tablet 1  . Vitamin D, Ergocalciferol, (DRISDOL) 1.25 MG (50000 UNIT) CAPS capsule Take 50,000 Units by  mouth every 7 (seven) days.     No current facility-administered medications on file prior to visit.    Allergies  Allergen Reactions  . Zithromax [Azithromycin Dihydrate] Anaphylaxis  . Kiwi Extract Other (See Comments)    Tongue goes numb    There were no vitals taken for this visit.  No problem-specific Assessment & Plan notes found for this encounter.  Medicine Morning Evening  Amlodipine 10 mg  X  Carvedilol 25 mg  X X  Hydralazine 100 mg  X X  Valsartan 40 mg X   Minoxidil 10 mg  X X  Spironolactone 25 mg X      Reizy Dunlow Rodriguez-Guzman PharmD, BCPS, [place] [id number] 01/04/2020 2:59 PM

## 2020-02-01 ENCOUNTER — Telehealth: Payer: Self-pay

## 2020-02-01 NOTE — Telephone Encounter (Signed)
Called and lmom pt stated that we need to reschedule htn appt

## 2020-02-15 IMAGING — CT CT RENAL STONE PROTOCOL
2 of 4 series · 16 of 46 positions shown, 18 images · non-contrast
Comparison: None.

CLINICAL DATA: Flank pain.  Ureteral stone suspected.

EXAM:
CT ABDOMEN AND PELVIS WITHOUT CONTRAST
TECHNIQUE: Multidetector CT imaging of the abdomen and pelvis was performed
following the standard protocol without IV contrast.

[Series 3: stone study 5.0 i30f 2 · axial · 0.91mm/px · z∈[+948,+1388]mm · 13 of 96 slices shown, 15 images]
[im 4/96  soft-tissue]
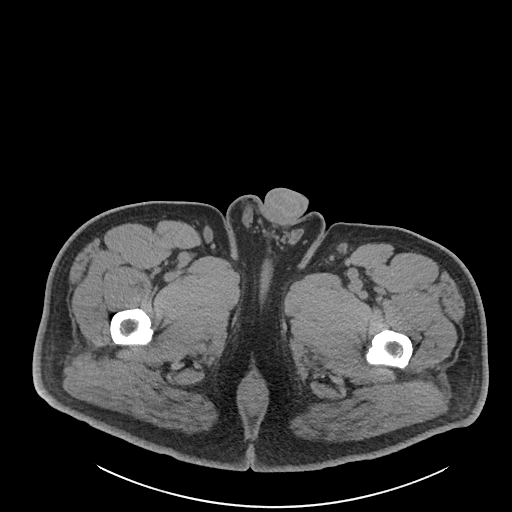
[im 4/96  bone]
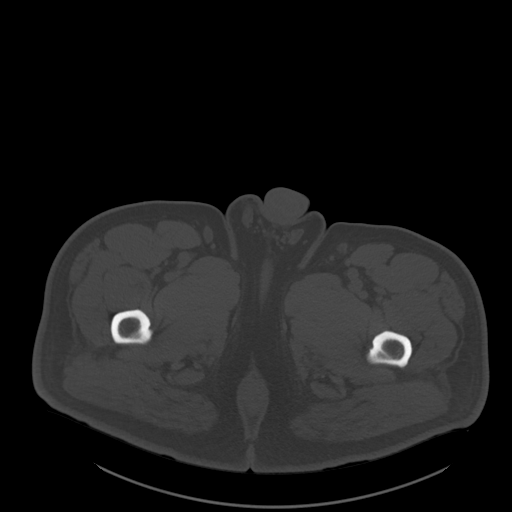
[im 12/96  soft-tissue]
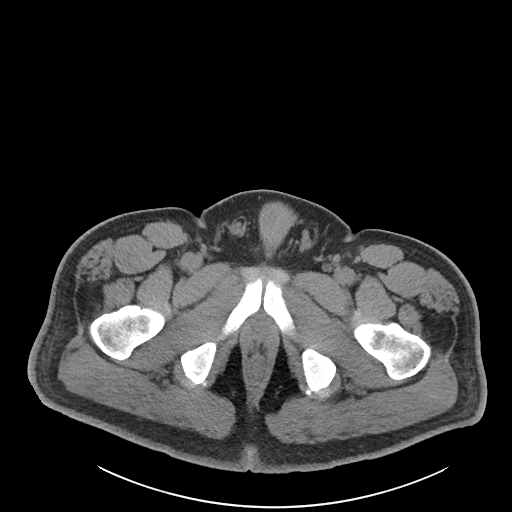
[im 20/96  soft-tissue]
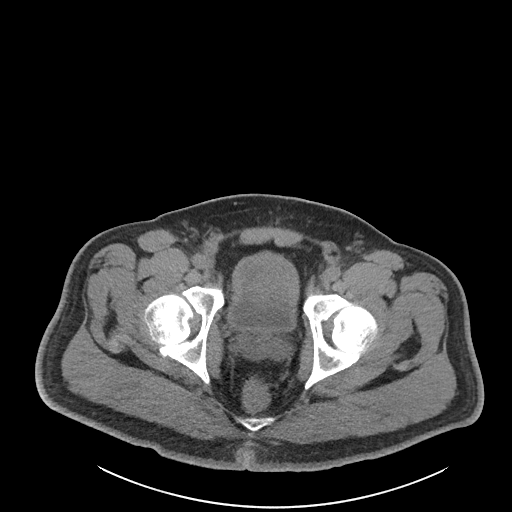
[im 28/96  soft-tissue]
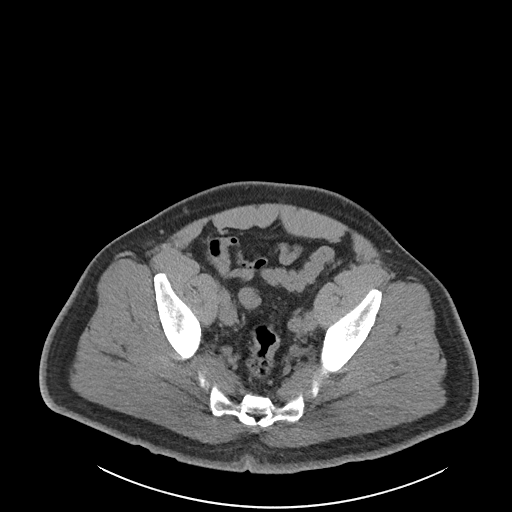
[im 32/96  soft-tissue]
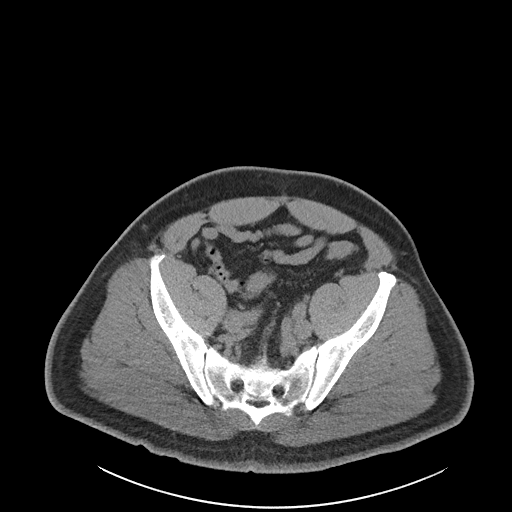
[im 40/96  soft-tissue]
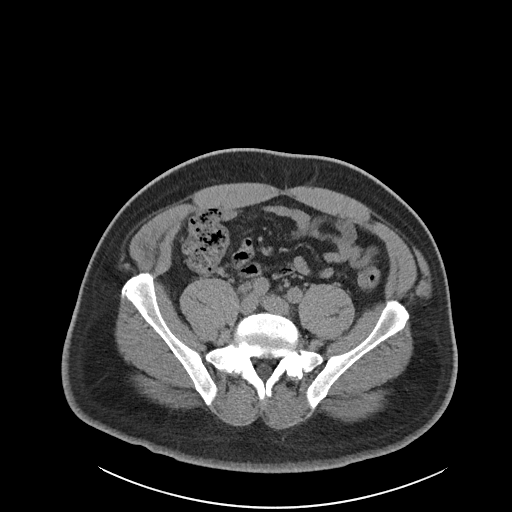
[im 48/96  soft-tissue]
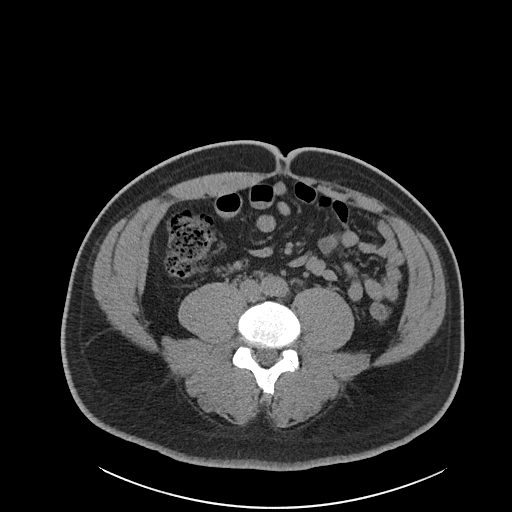
[im 56/96  soft-tissue]
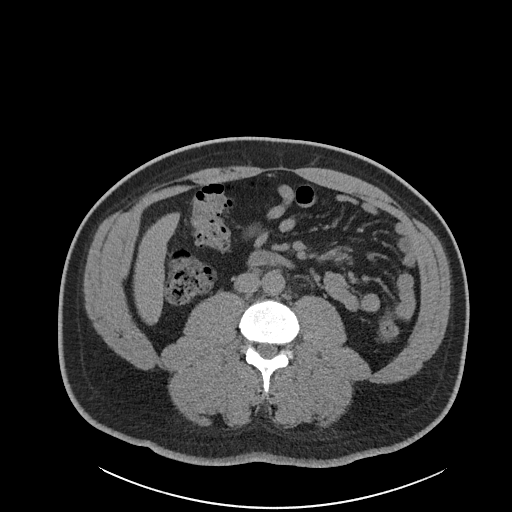
[im 64/96  soft-tissue]
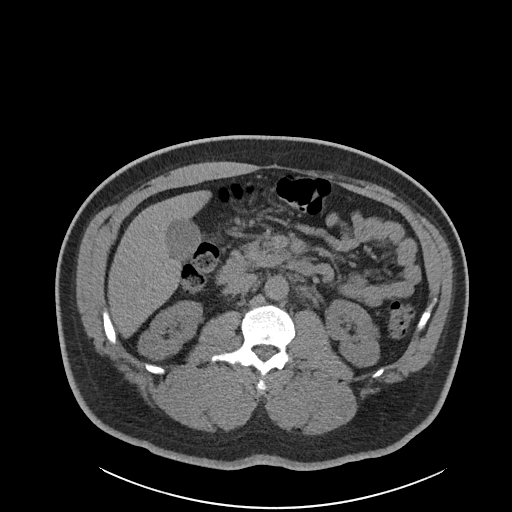
[im 64/96  bone]
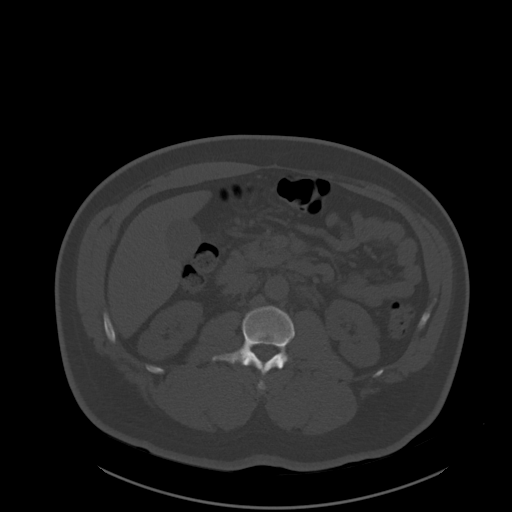
[im 68/96  soft-tissue]
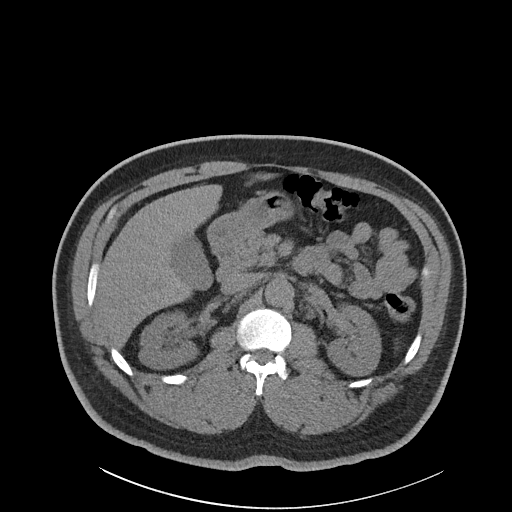
[im 76/96  soft-tissue]
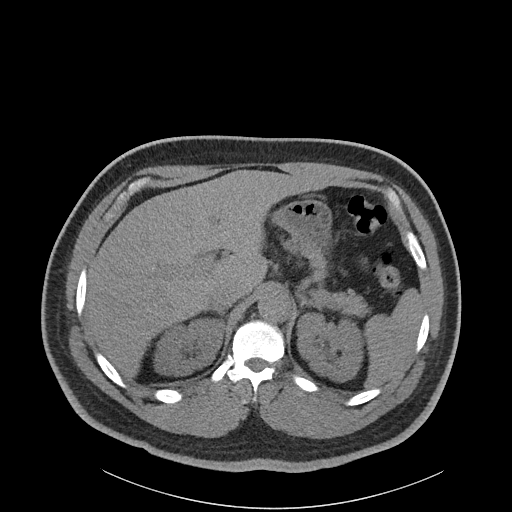
[im 84/96  soft-tissue]
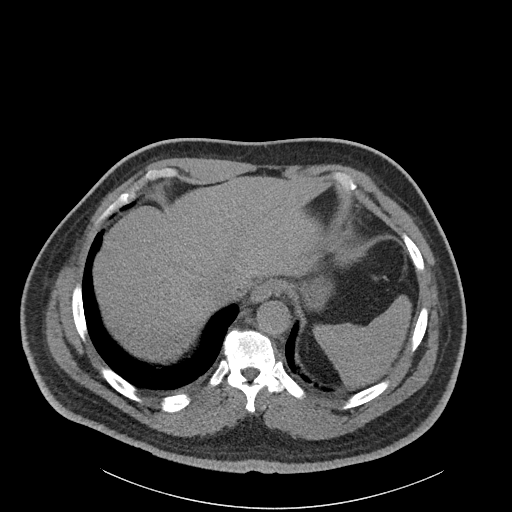
[im 92/96  soft-tissue]
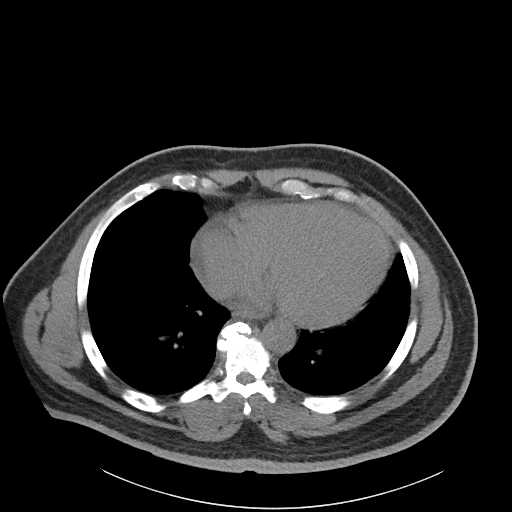

[Series 6: coronal soft tissue · coronal · 0.85mm/px · 3 of 123 slices shown]
[im 41/123  soft-tissue]
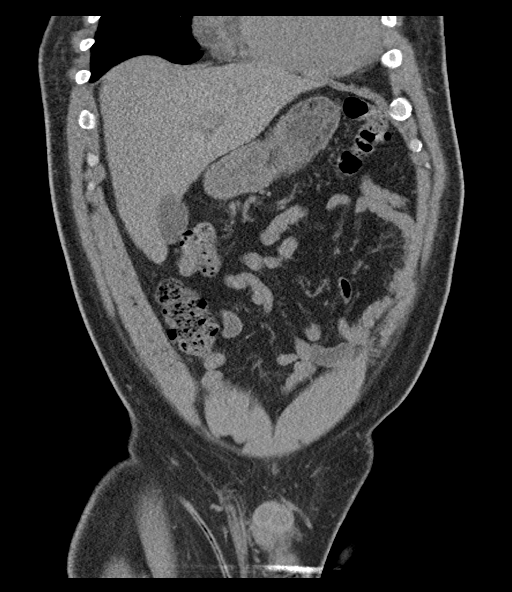
[im 55/123  soft-tissue]
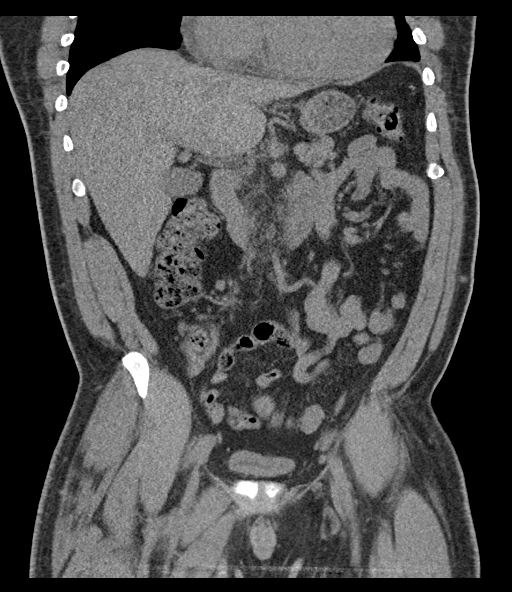
[im 68/123  soft-tissue]
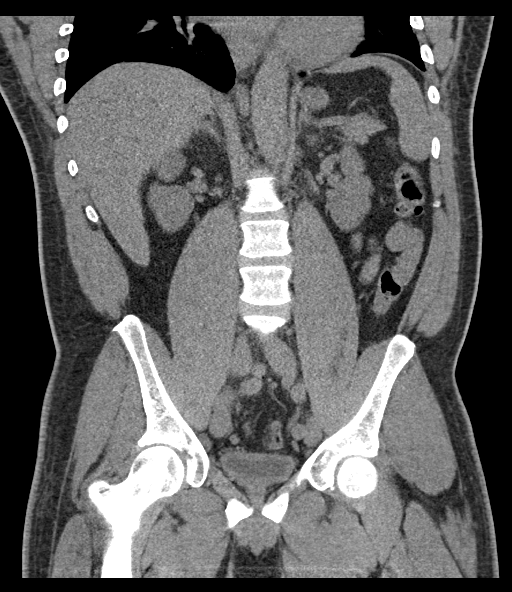

[16 of 46 positions shown; findings below may reference images not displayed]

FINDINGS: Lower chest: Heart mildly enlarged.  Lung bases are clear.

Hepatobiliary: No focal liver abnormality is seen. No gallstones,
gallbladder wall thickening, or biliary dilatation.

Pancreas: Unremarkable. No pancreatic ductal dilatation or
surrounding inflammatory changes.

Spleen: Normal in size without focal abnormality.

Adrenals/Urinary Tract: In overall no adrenal masses size,
orientation.

Kidneys normal and position. Mild contour lobulation bilaterally. No
masses, stones or hydronephrosis. Ureters are normal in course and
in caliber. Bladder is unremarkable.

Stomach/Bowel: Stomach is within normal limits. Appendix appears
normal. No evidence of bowel wall thickening, distention, or
inflammatory changes.

Vascular/Lymphatic: No significant vascular findings are present. No
enlarged abdominal or pelvic lymph nodes.

Reproductive: Unremarkable.

Other: No abdominal wall hernia or abnormality. No abdominopelvic
ascites.

Musculoskeletal: No fracture or acute finding. No osteoblastic or
osteolytic lesions.
IMPRESSION: 1. No acute findings.
2. No renal or ureteral stones or obstructive uropathy. No findings
to account for the patient's flank pain.

## 2020-03-11 DIAGNOSIS — G4733 Obstructive sleep apnea (adult) (pediatric): Secondary | ICD-10-CM | POA: Diagnosis not present

## 2020-04-08 ENCOUNTER — Encounter (HOSPITAL_COMMUNITY): Payer: Self-pay | Admitting: *Deleted

## 2020-04-08 ENCOUNTER — Ambulatory Visit (HOSPITAL_COMMUNITY)
Admission: EM | Admit: 2020-04-08 | Discharge: 2020-04-08 | Disposition: A | Discharge status: Home / Self Care | Payer: Medicaid Other | Attending: Family Medicine | Admitting: Family Medicine

## 2020-04-08 ENCOUNTER — Other Ambulatory Visit: Payer: Self-pay

## 2020-04-08 DIAGNOSIS — I160 Hypertensive urgency: Secondary | ICD-10-CM

## 2020-04-08 DIAGNOSIS — H01004 Unspecified blepharitis left upper eyelid: Secondary | ICD-10-CM | POA: Diagnosis not present

## 2020-04-08 MED ORDER — AMOXICILLIN-POT CLAVULANATE 875-125 MG PO TABS: 1.0000 | ORAL_TABLET | Freq: Two times a day (BID) | ORAL | 0 refills | Status: DC

## 2020-04-08 NOTE — ED Triage Notes (Signed)
Pt reports waking up today with swelling to upper and lower eye lid. Pt reported he thinks insect bite to upper lid.

## 2020-04-08 NOTE — ED Triage Notes (Signed)
PT did not take BP meds this AM

## 2020-04-08 NOTE — ED Notes (Signed)
Pt reports he is supposed to wear glasses but does not have any.

## 2020-04-08 NOTE — ED Provider Notes (Signed)
[name]    CSN: [id number] Arrival date & time: 04/08/20  0825      History   Chief Complaint Chief Complaint  Patient presents with  . Eye Problem    HPI Harold Clements is a 40 y.o. male.   Here today with 1 day of left upper eyelid swelling. Thinks he may have gotten a bug bite but did not see this happen, also has a growing pimple area over this brow. Not painful or itchy, no drainage from area or eye and no blurred vision, eye redness, headache, light sensitivity, N/V. So far has not tried anything OTC for sxs.      Past Medical History:  Diagnosis Date  . 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 Diabetes mellitus without complication ([place])   . Ear pain 07/2017  . Hypertension   . Stroke ([place])   . Wasp sting 08/04/2016   of dorsum of left hand near thumb    Patient Active Problem List   Diagnosis Date Noted  . Hypertensive heart disease 01/12/2019  . Ischemic stroke ([place]) 01/11/2019  . Stroke ([place]) 01/11/2019  . Hypertensive emergency 11/28/2018  . Hypertensive crisis 11/27/2018  . Obesity, Class III, BMI 40-49.9 (morbid obesity) ([place]) 11/27/2018  . Mastoiditis 08/12/2017  . Chronic kidney disease, stage 3a ([place]) 08/17/2016  . Hypertension associated with diabetes ([place]) 08/06/2016  . Diabetes ([place]) 08/06/2016    Past Surgical History:  Procedure Laterality Date  . BUBBLE STUDY  02/02/2019   Procedure: BUBBLE STUDY;  Surgeon: Lelon Perla, MD;  Location: [facility] ENDOSCOPY;  Service: Cardiovascular;;  . TEE WITHOUT CARDIOVERSION N/A 02/02/2019   Procedure: TRANSESOPHAGEAL ECHOCARDIOGRAM (TEE);  Surgeon: Lelon Perla, MD;  Location: [facility] ENDOSCOPY;  Service: Cardiovascular;  Laterality: N/A;       Home Medications    Prior to Admission medications   Medication Sig Start Date End Date Taking? Authorizing Provider  amoxicillin-clavulanate (AUGMENTIN) 875-125 MG tablet Take 1 tablet by mouth every 12 (twelve) hours. 04/08/20   Yes Volney American, PA-C  ASPIRIN 81 PO Take by mouth.   Yes [provider]  atorvastatin (LIPITOR) 80 MG tablet Take 1 tablet (80 mg total) by mouth daily at 6 PM. 11/28/19  Yes Skeet Latch, MD  carvedilol (COREG) 25 MG tablet Take 1 tablet (25 mg total) by mouth 2 (two) times daily with a meal. 12/13/19  Yes Skeet Latch, MD  hydrALAZINE (APRESOLINE) 100 MG tablet Take 1 tablet (100 mg total) by mouth 2 (two) times daily. 12/13/19  Yes Skeet Latch, MD  Insulin Isophane & Regular Human (NOVOLIN 70/30 FLEXPEN) (70-30) 100 UNIT/ML PEN Inject 26 Units into the skin 2 (two) times daily. 01/13/19  Yes [place], Carley D, DO  METFORMIN HCL PO Take 1,000 mg by mouth 2 (two) times daily.   Yes [provider]  minoxidil (LONITEN) 10 MG tablet Take 1 tablet (10 mg total) by mouth 2 (two) times daily. 12/13/19  Yes Skeet Latch, MD  spironolactone (ALDACTONE) 25 MG tablet Take 1 tablet (25 mg total) by mouth daily. 12/13/19  Yes Skeet Latch, MD  valsartan (DIOVAN) 40 MG tablet Take 1 tablet (40 mg total) by mouth daily. 12/13/19  Yes Skeet Latch, MD  Vitamin D, Ergocalciferol, (DRISDOL) 1.25 MG (50000 UNIT) CAPS capsule Take 50,000 Units by mouth every 7 (seven) days.   Yes [provider]  amLODipine (NORVASC) 10 MG tablet Take 1 tablet (10 mg total) by  mouth daily. 12/13/19 01/12/20  Skeet Latch, MD    Family History Family History  Problem Relation Age of Onset  . Hypertension Mother   . Diabetes Mother   . Heart disease Mother   . Heart attack Mother        young. unsure age  . Hypertension Father   . Heart disease Brother     Social History Social History   Tobacco Use  . Smoking status: Former Smoker    Years: 4.00    Types: Cigars    Quit date: 2010    Years since quitting: 12.1  . Smokeless tobacco: Never Used  Vaping Use  . Vaping Use: Never used  Substance Use Topics  . Alcohol use: No  . Drug use:  No     Allergies   Zithromax [azithromycin dihydrate] and Kiwi extract   Review of Systems Review of Systems PER HPI    Physical Exam Triage Vital Signs ED Triage Vitals  Enc Vitals Group     BP 04/08/20 0856 (!) 238/150     Pulse Rate 04/08/20 0856 99     Resp 04/08/20 0856 18     Temp 04/08/20 0856 98.5 F (36.9 C)     Temp Source 04/08/20 0856 Oral     SpO2 04/08/20 0856 98 %     Weight --      Height --      Head Circumference --      Peak Flow --      Pain Score 04/08/20 0859 0     Pain Loc --      Pain Edu? --      Excl. in [place]? --    No data found.  Updated Vital Signs BP (!) 238/150 (BP Location: Left Arm)   Pulse 99   Temp 98.5 F (36.9 C) (Oral)   Resp 18   SpO2 98%   Visual Acuity Right Eye Distance: 20/40 Left Eye Distance: 20/100 Bilateral Distance: 20/40  Right Eye Near:   Left Eye Near:    Bilateral Near:     Physical Exam Vitals and nursing note reviewed.  Constitutional:      Appearance: Normal appearance.  HENT:     Head: Atraumatic.  Eyes:     General:        Right eye: No discharge.        Left eye: No discharge.     Extraocular Movements: Extraocular movements intact.     Conjunctiva/sclera: Conjunctivae normal.     Pupils: Pupils are equal, round, and reactive to light.     Comments: Left upper eyelid diffusely edematous, mildly erythematous. nontender to palpation, no drainage or fluctuance, no insect bite mark Erythematous pustule present at left brow that appears to be worsening  Cardiovascular:     Rate and Rhythm: Normal rate and regular rhythm.  Pulmonary:     Effort: Pulmonary effort is normal.     Breath sounds: Normal breath sounds.  Musculoskeletal:        General: Normal range of motion.     Cervical back: Normal range of motion and neck supple.  Skin:    General: Skin is warm and dry.  Neurological:     General: No focal deficit present.     Mental Status: He is oriented to person, place, and time.   Psychiatric:        Mood and Affect: Mood normal.        Thought Content: Thought content normal.  Judgment: Judgment normal.      UC Treatments / Results  Labs (all labs ordered are listed, but only abnormal results are displayed) Labs Reviewed - No data to display  EKG   Radiology No results found.  Procedures Procedures (including critical care time)  Medications Ordered in UC Medications - No data to display  Initial Impression / Assessment and Plan / UC Course  I have reviewed the triage vital signs and the nursing notes.  Pertinent labs & imaging results that were available during my care of the patient were reviewed by me and considered in my medical decision making (see chart for details).     Will treat with augmentin in case swelling coming from worsening pustule/abscess forming at brow, warm compresses, antihistamines in case allergic cause. Return precautions with this given.  Significantly elevated BP today which does appear to be near his baseline. He denies headache, blurred vision, CP, SOB, dizziness today and states he hasn't taken his BP medication yet today. Does have a Cardiologist but has missed some appointments. Declines going to ED despite hx of CVA, HF, CKD and risks reviewed. Take medication at home and go to ED if worsening. Call Cardiologist for immediate f/u.   Final Clinical Impressions(s) / UC Diagnoses   Final diagnoses:  Blepharitis of left upper eyelid, unspecified type  Hypertensive urgency   Discharge Instructions   None    ED Prescriptions    Medication Sig Dispense Auth. Provider   amoxicillin-clavulanate (AUGMENTIN) 875-125 MG tablet Take 1 tablet by mouth every 12 (twelve) hours. 14 tablet Volney American, [place]     PDMP not reviewed this encounter.   Volney American, [place] 04/08/20 1206

## 2020-04-22 ENCOUNTER — Telehealth: Payer: Self-pay | Admitting: *Deleted

## 2020-04-22 NOTE — Telephone Encounter (Signed)
Dr Blenda Mounts 10/26/19 office visit and recent download faxed to [facility].

## 2020-04-30 DIAGNOSIS — G4733 Obstructive sleep apnea (adult) (pediatric): Secondary | ICD-10-CM | POA: Diagnosis not present

## 2020-07-31 DIAGNOSIS — G4733 Obstructive sleep apnea (adult) (pediatric): Secondary | ICD-10-CM | POA: Diagnosis not present

## 2020-09-02 ENCOUNTER — Other Ambulatory Visit: Payer: Self-pay | Admitting: Physician Assistant

## 2020-09-02 ENCOUNTER — Other Ambulatory Visit (INDEPENDENT_AMBULATORY_CARE_PROVIDER_SITE_OTHER): Payer: Self-pay | Admitting: Primary Care

## 2020-09-02 ENCOUNTER — Other Ambulatory Visit: Payer: Self-pay | Admitting: Family Medicine

## 2020-09-03 NOTE — Progress Notes (Signed)
Cardiology Office Note   Date:  09/04/2020   ID:  [name], DOB [birth year], MRN [id number]  PCP:  Kerin Perna, NP  Cardiologist:  Sanda Klein, MD  Electrophysiologist:  None   Evaluation Performed:  Follow-Up Visit  Chief Complaint: Malignant hypertension  History of Present Illness:    Harold Clements is a 40 y.o. male with malignant hypertension, chronic diastolic heart failure, CKD stage III, diabetes mellitus type 2, OSA with incomplete compliance with CPAP, morbid obesity and stroke.  He has been "out of his medicines for months".  This includes all his antihypertensive medications and his insulin.  His wife called in on the phone during our appointment and states that she worries that he is depressed, that he has "given up", he does not care about his health.  She believes she needs something to snap him out of depression.  He has been using his CPAP intermittently.  He has polyuria and polydipsia.  He looks quite dry today.  Despite this his blood pressure is severely elevated.  BP was 238/150 during February ED visit for blepharitis. Asymptomatic at the time from a cardiovascular point of view.  He denies problems with exertional angina or dyspnea.  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  He denies orthopnea, PND, lower extremity edema.  He has not had palpitations, dizziness or syncope but sometimes feels weak.  He admits to a depressed mood, but would not elaborate much on his feelings.  At his visit on January 30, 2018, his blood pressure was well controlled, on the following regimen:    Medicine Morning Evening  Amlodipine 10 mg   X  Carvedilol 25 mg X X  Hydralazine 100 mg X X  Valsartan 40 mg X    Minoxidil 10 mg X X  Spironolactone 25 mg X      Subsequent follow-up notes point out the fact that he was having difficulty paying for his medication and remembering to take the 3 times daily prescriptions.   Our clinical pharmacist try to help him with a $25 gift card to pick up his prescriptions.  Subsequently, blood pressure control began to deteriorate and he has not been seen in follow-up since October 2021.  His creatinine improved with treatment of his hypertension and appeared to stabilize at 1.8-2.0, GFR around 50.  He has not had labs since September 2021, but at that time his hemoglobin was 14.3.  Most recent TSH from November 2020 was normal.  Lipid profile was favorable while he was taking statins, with an LDL down to 78, but also with a low HDL of only 32 (labs from November 2020).  It seems he has recovered reasonably well from the stroke he experienced in November 2020 (acute right basal ganglia hypodensity on CT, transient left-sided weakness and confusion).  He has not had any new neurological events.  TEE performed on February 02, 2019 showed mildly decreased LVEF 45-50% with moderate LVH, normal left atrial size and no significant valvular abnormalities.  Saline contrast study was negative for shunt.   Past Medical History:  Diagnosis Date   CKD (chronic kidney disease) stage 2, GFR 60-89 ml/min    Diabetes mellitus without complication ([place])    Ear pain 07/2017   Hypertension    Stroke ([place])    Wasp sting 08/04/2016   of dorsum of left hand near thumb   Past Surgical History:  Procedure  Laterality Date   BUBBLE STUDY  02-06-2019   Procedure: BUBBLE STUDY;  Surgeon: Harold Perla, MD;  Location: [facility] ENDOSCOPY;  Service: Cardiovascular;;   TEE WITHOUT CARDIOVERSION N/A February 06, 2019   Procedure: TRANSESOPHAGEAL ECHOCARDIOGRAM (TEE);  Surgeon: Harold Perla, MD;  Location: [facility] ENDOSCOPY;  Service: Cardiovascular;  Laterality: N/A;     Current Meds  Medication Sig   ASPIRIN 81 PO Take by mouth.   Insulin Isophane & Regular Human (NOVOLIN 70/30 FLEXPEN) (70-30) 100 UNIT/ML PEN Inject 26 Units into the skin 2 (two) times daily.   [DISCONTINUED] amoxicillin-clavulanate  (AUGMENTIN) 875-125 MG tablet Take 1 tablet by mouth every 12 (twelve) hours.   [DISCONTINUED] atorvastatin (LIPITOR) 80 MG tablet Take 1 tablet (80 mg total) by mouth daily at 6 PM.   [DISCONTINUED] carvedilol (COREG) 25 MG tablet Take 1 tablet (25 mg total) by mouth 2 (two) times daily with a meal.   [DISCONTINUED] hydrALAZINE (APRESOLINE) 100 MG tablet Take 1 tablet (100 mg total) by mouth 2 (two) times daily.   [DISCONTINUED] losartan (COZAAR) 25 MG tablet Take 1 tablet by mouth once daily   [DISCONTINUED] METFORMIN HCL PO Take 1,000 mg by mouth 2 (two) times daily.   [DISCONTINUED] minoxidil (LONITEN) 10 MG tablet Take 1 tablet (10 mg total) by mouth 2 (two) times daily.   [DISCONTINUED] spironolactone (ALDACTONE) 25 MG tablet Take 1 tablet (25 mg total) by mouth daily.   [DISCONTINUED] valsartan (DIOVAN) 40 MG tablet Take 1 tablet (40 mg total) by mouth daily.   [DISCONTINUED] Vitamin D, Ergocalciferol, (DRISDOL) 1.25 MG (50000 UNIT) CAPS capsule Take 50,000 Units by mouth every 7 (seven) days.     Allergies:   Zithromax [azithromycin dihydrate] and Kiwi extract   Social History   Tobacco Use   Smoking status: Former    Pack years: 0.00    Types: Cigars    Quit date: 2010    Years since quitting: 12.5   Smokeless tobacco: Never  Vaping Use   Vaping Use: Never used  Substance Use Topics   Alcohol use: No   Drug use: No     Family Hx: The patient's family history includes Diabetes in his mother; Heart attack in his mother; Heart disease in his brother and mother; Hypertension in his father and mother.  ROS:   Please see the history of present illness.     All other systems reviewed and are negative.   Prior CV studies:   The following studies were reviewed today: TEE 02/06/19  1. Left ventricular ejection fraction, by visual estimation, is 45 to 50%. The left ventricle has mildly decreased function. There is moderately increased left ventricular hypertrophy.  2.  Global right ventricle has normal systolic function.The right ventricular size is normal.  3. Left atrial size was normal.  4. Right atrial size was normal.  5. Small pericardial effusion.  6. The mitral valve is normal in structure. Trace mitral valve regurgitation.  7. The tricuspid valve is normal in structure. Tricuspid valve regurgitation is trivial.  8. The aortic valve is tricuspid. Aortic valve regurgitation is trivial.  9. The pulmonic valve was grossly normal. Pulmonic valve regurgitation is not visualized. 10. Mild plaque invoving the descending aorta. 11. Mild global reduction in LV systolic function; LVH; trace AI, MR and TR; small pericardial effusion; negative saline microcavitation study.  Labs/Other Tests and Data Reviewed:    EKG: ECG is ordered today and personally reviewed and shows sinus rhythm, biatrial enlargement, left ventricular hypertrophy with  voltage partially masked by obesity, secondary ST-T wave changes, mildly prolonged QTC 475 ms.  Not meaningfully changed from previous tracings.  Recent Labs: 11/28/2019: BUN 21; Creatinine, Ser 1.98; Hemoglobin 14.3; Platelets 234; Potassium 4.0; Sodium 135   Recent Lipid Panel Lab Results  Component Value Date/Time   CHOL 134 01/12/2019 03:39 AM   TRIG 118 01/12/2019 03:39 AM   HDL 32 (L) 01/12/2019 03:39 AM   CHOLHDL 4.2 01/12/2019 03:39 AM   LDLCALC 78 01/12/2019 03:39 AM    Wt Readings from Last 3 Encounters:  09/04/20 262 lb 6.4 oz (119 kg)  12/13/19 271 lb 12.8 oz (123.3 kg)  11/28/19 271 lb 3.2 oz (123 kg)     Objective:    Vital Signs:  BP (!) 215/125   Pulse 94   Ht '5\' 9"'$  (1.753 m)   Wt 262 lb 6.4 oz (119 kg)   SpO2 95%   BMI 38.75 kg/m    General: Alert, oriented x3, no distress, obese Head: no evidence of trauma, PERRL, EOMI, no exophtalmos or lid lag, no myxedema, no xanthelasma; normal ears, nose and oropharynx Neck: normal jugular venous pulsations and no hepatojugular reflux; brisk carotid  pulses without delay and no carotid bruits Chest: clear to auscultation, no signs of consolidation by percussion or palpation, normal fremitus, symmetrical and full respiratory excursions Cardiovascular: normal position and quality of the apical impulse, regular rhythm, normal first and second heart sounds, no murmurs, rubs.  S4 gallop present Abdomen: no tenderness or distention, no masses by palpation, no abnormal pulsatility or arterial bruits, normal bowel sounds, no hepatosplenomegaly Extremities: no clubbing, cyanosis or edema; 2+ radial, ulnar and brachial pulses bilaterally; 2+ right femoral, posterior tibial and dorsalis pedis pulses; 2+ left femoral, posterior tibial and dorsalis pedis pulses; no subclavian or femoral bruits Neurological: grossly nonfocal Psych: Normal mood and affect  ASSESSMENT & PLAN:    1. Malignant hypertension   2. Chronic diastolic heart failure ([place])   3. OSA (obstructive sleep apnea)   4. Hypercholesterolemia   5. Diabetes mellitus type 2 in obese (HCC)   6. Stage 3b chronic kidney disease ([place])   7. History of arterial ischemic stroke      HTN: We need to gradually restart him on his medications, keeping the regimen inexpensive and as simple as possible.  We were only able to get his blood pressure in normal range once we started him on minoxidil and will initiate this first, as well as beta-blocker to avoid reflex tachycardia.  We will start him on half the previous dose of carvedilol, since he may be at risk for development of overt heart failure.  At this point I do not think we need to start diuretics since he appears dehydrated, probably due to glucosuria.  We will bring him back in a couple of weeks when plan to increase the carvedilol to 25 mg twice daily.  We will also plan to restart spironolactone on a as [place] as his renal function allows this.  We tried to check labs today, but he was too dry for phlebotomist to draw any blood. CHF: He had mildly  depressed left ventricular systolic function, but improved to improve with better blood pressure control.  He never had clinical heart failure. OSA: Advised 100% compliance with CPAP. HLP: His LDL cholesterol was pretty good when he was taking a statin.  For the time being we will focus on control of his severe hypertension.  Plan to resume statin. DM: He has  symptoms of decompensated hyperglycemia.  Needs to get back in touch with his primary care provider to restart treatment with insulin, ultimately transition him to oral antidiabetics.  While GLP-1 agonist and SGLT2 inhibitors would be associated with best [place]-term outcome, I do not think he can afford these medications at this time.  The primary focus should be on reducing his glucose to a level where it does not cause hypovolemia or put him at risk for severe metabolic decompensation.. CKD 3b: Most recent creatinine was just under 2.0.  GFR just under 50.  Need to repeat his labs.  He is at high risk for progression to end-stage renal disease.  His mother is on hemodialysis. History of ischemic stroke: Restart aspirin 81 mg once daily. Cannot exclude the possibility that he does indeed have depression.  I think he needs to get in touch with his primary care provider.   Patient Instructions  Medication Instructions:  TAKE Carvedilol 12.5 mg twice daily (half of the 25 mg tablet)  TAKE Minoxidil 10 mg twice daily  *If you need a refill on your cardiac medications before your next appointment, please call your pharmacy*   Lab Work: Your provider would like for you to have the following labs today: Lipid, CBC, A1C, CMET and TSH  If you have labs (blood work) drawn today and your tests are completely normal, you will receive your results only by: [place] (if you have MyChart) OR A paper copy in the mail If you have any lab test that is abnormal or we need to change your treatment, we will call you to review the  results.   Testing/Procedures: None ordered   Follow-Up: At [facility], you and your health needs are our priority.  As part of our continuing mission to provide you with exceptional heart care, we have created designated Provider Care Teams.  These Care Teams include your primary Cardiologist (physician) and Advanced Practice Providers (APPs -  Physician Assistants and Nurse Practitioners) who all work together to provide you with the care you need, when you need it.  We recommend signing up for the patient portal called "MyChart".  Sign up information is provided on this After Visit Summary.  MyChart is used to connect with patients for Virtual Visits (Telemedicine).  Patients are able to view lab/test results, encounter notes, upcoming appointments, etc.  Non-urgent messages can be sent to your provider as well.   To learn more about what you can do with MyChart, go to [email or web address].    Your next appointment:   Follow up in 2 weeks with pharmD for medication adjustments  Follow up in one month with Dr. Sallyanne Kuster Signed, Sanda Klein, MD  09/04/2020 4:11 PM    [place]

## 2020-09-03 NOTE — Telephone Encounter (Signed)
   Notes to clinic:  medication filled by a different provider  Review for refills   Requested Prescriptions  Pending Prescriptions Disp Refills   atorvastatin (LIPITOR) 80 MG tablet [Pharmacy Med Name: Atorvastatin Calcium 80 MG Oral Tablet] 90 tablet 0    Sig: TAKE 1 TABLET BY MOUTH ONCE DAILY AT 6PM      Cardiovascular:  Antilipid - Statins Failed - 09/02/2020  6:43 AM      Failed - Total Cholesterol in normal range and within 360 days    Cholesterol  Date Value Ref Range Status  01/12/2019 134 0 - 200 mg/dL Final          Failed - LDL in normal range and within 360 days    LDL Cholesterol  Date Value Ref Range Status  01/12/2019 78 0 - 99 mg/dL Final    Comment:           Total Cholesterol/HDL:CHD Risk Coronary Heart Disease Risk Table                     Men   Women  1/2 Average Risk   3.4   3.3  Average Risk       5.0   4.4  2 X Average Risk   9.6   7.1  3 X Average Risk  23.4   11.0        Use the calculated Patient Ratio above and the CHD Risk Table to determine the patient's CHD Risk.        ATP III CLASSIFICATION (LDL):  <100     mg/dL   Optimal  100-129  mg/dL   Near or Above                    Optimal  130-159  mg/dL   Borderline  160-189  mg/dL   High  >190     mg/dL   Very High Performed at [facility] [address], [place], [place] [address]           Failed - HDL in normal range and within 360 days    HDL  Date Value Ref Range Status  01/12/2019 32 (L) >40 mg/dL Final          Failed - Triglycerides in normal range and within 360 days    Triglycerides  Date Value Ref Range Status  01/12/2019 118 <150 mg/dL Final          Failed - Valid encounter within last 12 months    Recent Outpatient Visits           1 year ago Ischemic stroke [facility])   [place], [place], NP                Passed - Patient is not pregnant

## 2020-09-03 NOTE — Telephone Encounter (Signed)
  Notes to clinic:  script requested is not on list  Review for refill   Requested Prescriptions  Pending Prescriptions Disp Refills   methocarbamol (ROBAXIN) 500 MG tablet [Pharmacy Med Name: Methocarbamol 500 MG Oral Tablet] 20 tablet 0    Sig: TAKE 1 TABLET BY MOUTH 4 TIMES DAILY AS NEEDED FOR MUSCLE SPASMS      Not Delegated - Analgesics:  Muscle Relaxants Failed - 09/02/2020  6:43 AM      Failed - This refill cannot be delegated      Failed - Valid encounter within last 6 months    Recent Outpatient Visits           1 year ago Ischemic stroke [facility])   [place] Kerin Perna, NP

## 2020-09-04 ENCOUNTER — Telehealth (INDEPENDENT_AMBULATORY_CARE_PROVIDER_SITE_OTHER): Payer: Self-pay

## 2020-09-04 ENCOUNTER — Other Ambulatory Visit: Payer: Self-pay

## 2020-09-04 ENCOUNTER — Ambulatory Visit (INDEPENDENT_AMBULATORY_CARE_PROVIDER_SITE_OTHER): Payer: Medicaid Other | Admitting: Cardiovascular Disease

## 2020-09-04 ENCOUNTER — Encounter: Payer: Self-pay | Admitting: Cardiovascular Disease

## 2020-09-04 VITALS — BP 215/125 | HR 94 | Ht 69.0 in | Wt 262.4 lb

## 2020-09-04 DIAGNOSIS — G4733 Obstructive sleep apnea (adult) (pediatric): Secondary | ICD-10-CM | POA: Diagnosis not present

## 2020-09-04 DIAGNOSIS — N1832 Chronic kidney disease, stage 3b: Secondary | ICD-10-CM | POA: Diagnosis not present

## 2020-09-04 DIAGNOSIS — I10 Essential (primary) hypertension: Secondary | ICD-10-CM

## 2020-09-04 DIAGNOSIS — E669 Obesity, unspecified: Secondary | ICD-10-CM | POA: Diagnosis not present

## 2020-09-04 DIAGNOSIS — E7800 Pure hypercholesterolemia, unspecified: Secondary | ICD-10-CM | POA: Diagnosis not present

## 2020-09-04 DIAGNOSIS — I5032 Chronic diastolic (congestive) heart failure: Secondary | ICD-10-CM

## 2020-09-04 DIAGNOSIS — E1169 Type 2 diabetes mellitus with other specified complication: Secondary | ICD-10-CM

## 2020-09-04 DIAGNOSIS — Z8673 Personal history of transient ischemic attack (TIA), and cerebral infarction without residual deficits: Secondary | ICD-10-CM

## 2020-09-04 MED ORDER — MINOXIDIL 10 MG PO TABS
10.0000 mg | ORAL_TABLET | Freq: Two times a day (BID) | ORAL | 11 refills | Status: DC
Start: 2020-09-04 — End: 2021-02-10

## 2020-09-04 MED ORDER — CARVEDILOL 25 MG PO TABS: 12.5000 mg | ORAL_TABLET | Freq: Two times a day (BID) | ORAL | 11 refills | Status: DC

## 2020-09-04 NOTE — Telephone Encounter (Signed)
Copied from [place] [phone]. Topic: General - Other >> Sep 03, 2020  1:04 PM Celene Kras wrote: Reason for CRM: Pts wife called on behalf of pt. She states that the pt is very depressed and that he is no longer taking his medication. She states that she believes the pt should be seen by therapist. She states that the pt just wants to sleep and not get up. Please advise.

## 2020-09-04 NOTE — Patient Instructions (Signed)
Medication Instructions:  TAKE Carvedilol 12.5 mg twice daily (half of the 25 mg tablet)  TAKE Minoxidil 10 mg twice daily  *If you need a refill on your cardiac medications before your next appointment, please call your pharmacy*   Lab Work: Your provider would like for you to have the following labs today: Lipid, CBC, A1C, CMET and TSH  If you have labs (blood work) drawn today and your tests are completely normal, you will receive your results only by: [place] (if you have MyChart) OR A paper copy in the mail If you have any lab test that is abnormal or we need to change your treatment, we will call you to review the results.   Testing/Procedures: None ordered   Follow-Up: At [facility], you and your health needs are our priority.  As part of our continuing mission to provide you with exceptional heart care, we have created designated Provider Care Teams.  These Care Teams include your primary Cardiologist (physician) and Advanced Practice Providers (APPs -  Physician Assistants and Nurse Practitioners) who all work together to provide you with the care you need, when you need it.  We recommend signing up for the patient portal called "MyChart".  Sign up information is provided on this After Visit Summary.  MyChart is used to connect with patients for Virtual Visits (Telemedicine).  Patients are able to view lab/test results, encounter notes, upcoming appointments, etc.  Non-urgent messages can be sent to your provider as well.   To learn more about what you can do with MyChart, go to [email or web address].    Your next appointment:   Follow up in 2 weeks with pharmD for medication adjustments  Follow up in one month with Dr. Sallyanne Kuster

## 2020-09-06 ENCOUNTER — Encounter (INDEPENDENT_AMBULATORY_CARE_PROVIDER_SITE_OTHER): Payer: Self-pay

## 2020-09-06 NOTE — Telephone Encounter (Signed)
Attempted to call pt twice, phone went straight to voicemail, I left a message. Called other number on file and spoke with pt's wife. She advised to call back after 2pm as she would be around pt at that time. LCSW will call pt back.

## 2020-09-09 NOTE — Telephone Encounter (Signed)
Appt scheduled with pt for 09/12/20 at 11:00AM

## 2020-09-10 ENCOUNTER — Telehealth: Payer: Self-pay

## 2020-09-10 DIAGNOSIS — G4733 Obstructive sleep apnea (adult) (pediatric): Secondary | ICD-10-CM | POA: Diagnosis not present

## 2020-09-10 NOTE — Telephone Encounter (Signed)
Lmom for pt to complete fasting labs prior to a pharmd appt

## 2020-09-12 ENCOUNTER — Ambulatory Visit (INDEPENDENT_AMBULATORY_CARE_PROVIDER_SITE_OTHER): Payer: Medicaid Other | Admitting: Clinical

## 2020-09-12 ENCOUNTER — Other Ambulatory Visit: Payer: Self-pay

## 2020-09-12 DIAGNOSIS — R5383 Other fatigue: Secondary | ICD-10-CM | POA: Diagnosis not present

## 2020-09-12 DIAGNOSIS — F4329 Adjustment disorder with other symptoms: Secondary | ICD-10-CM

## 2020-09-12 DIAGNOSIS — F4321 Adjustment disorder with depressed mood: Secondary | ICD-10-CM

## 2020-09-12 NOTE — BH Specialist Note (Signed)
Integrated Behavioral Health Initial In-Person Visit  MRN: [id number] Name: Harold Clements  Number of [place] Clinician visits:: 1/6 Session Start time: 11:30 AM  Session End time: 12:30 PM Total time: 60 minutes  Types of Service: Individual psychotherapy  Interpretor:No. Interpretor Name and Language: N/A   Warm Hand Off Completed.         Subjective: Harold Clements is a 40 y.o. male accompanied by  self Patient was referred by PCP and pt's wife for depressive symptoms. Patient reports the following symptoms/concerns: Pt reports feeling excessively tired, excessive sleeping, overeating, decreased interest in daily activities, excessive worrying, trouble relaxing, and irritability. Duration of problem: 1.5 years; Severity of problem: mild  Objective: Mood: Anxious and Affect: Appropriate Risk of harm to self or others: No plan to harm self or others  Life Context: Family and Social: Pt reports that he is married with three children.  School/Work: Pt currently works full-time. Self-Care: Pt reports limited coping skills due to excessive sleeping.  Life Changes: Pt reports that he had a stroke in 2020 and has noticed a change in his energy since then. Reports that he sleeps excessively and has decreased motivation. Reports that he has experienced physical health changes.  Patient and/or Family's Strengths/Protective Factors: Social connections, Concrete supports in place (healthy food, safe environments, etc.), and Sense of purpose  Goals Addressed: Patient will: Reduce symptoms of: stress and fatigue Increase knowledge and/or ability of: coping skills, healthy habits, self-management skills, and stress reduction  Demonstrate ability to: Increase healthy adjustment to current life circumstances, Increase motivation to adhere to plan of care, and Increase healthy lifestyle changes  Progress towards Goals: Ongoing  Interventions: Interventions utilized:  Mindfulness or Psychologist, educational, CBT Cognitive Behavioral Therapy, Supportive Counseling, Sleep Hygiene, and Psychoeducation and/or Health Education  Standardized Assessments completed:  MDQ, GAD-7, and PHQ 9  Patient and/or Family Response: Pt receptive to plan and tx.  Patient Centered Plan: Patient is on the following Treatment Plan(s):  Pt will fu with LCSW and adjust eating habits and begin exercising.   Assessment: Pt presents with anxious mood with an appropriate affect. Denies SI/HI. No auditory/visual hallucinations. No safety risks. Pt reports that he had a stroke in 2020. Reports that he experienced physical health changes since having a stroke. Reports that noticed a decrease in his energy and motivation since having a stroke. Reports that he sleeps for 10-12 hours, takes naps, and still feels tired. Reports that he often feels exhausted while working. Reports a decrease interest in daily activities. Reports that he does not exercise and his diet consists of fatty foods. Reports that he exercised when he was 58 but stopped due to beng told of a health complication due to lifting weights. Reports that his wife tries to motivate him to increase his energy. Patient currently experiencing fatigue with an adjustment reaction. LCSW provided psychoeducation on depression without feeling sad. Pt appears to have had difficulty adjusting to physical health and appears to feel guilty.   Patient may benefit from medication management and brief therapy. Pt may also benefit from lifestyle changes with eating habits and exercising. Pt agreed to eating at least one healthy meal/day and increase exercise. LCSW will inform care coordinator of need for psychiatry referral for antidepressants. Pt would also benefit from visit with PCP. LCSW will inform PCP so that pt can get scheduled. LCSW will fu with pt.  Plan: Follow up with behavioral health clinician on : 09/26/20 Behavioral recommendations: Begin  eating  at least one balanced healthy meal per day,  begin exercising, identify enjoyable activities. Referral(s): [place] (In Clinic) and Psychiatrist "From scale of 1-10, how likely are you to follow plan?": 10  Jaslen Adcox C Kathyjo Briere, LCSW

## 2020-09-25 ENCOUNTER — Ambulatory Visit: Payer: Medicaid Other

## 2020-09-26 ENCOUNTER — Ambulatory Visit (INDEPENDENT_AMBULATORY_CARE_PROVIDER_SITE_OTHER): Payer: Medicaid Other | Admitting: Clinical

## 2020-09-27 ENCOUNTER — Inpatient Hospital Stay (HOSPITAL_COMMUNITY)
Admission: EM | Admit: 2020-09-27 | Discharge: 2020-10-03 | DRG: 189 | Disposition: A | Discharge status: Home / Self Care | Payer: Medicaid Other | Attending: Internal Medicine | Admitting: Internal Medicine

## 2020-09-27 DIAGNOSIS — E1159 Type 2 diabetes mellitus with other circulatory complications: Secondary | ICD-10-CM

## 2020-09-27 DIAGNOSIS — J80 Acute respiratory distress syndrome: Secondary | ICD-10-CM | POA: Diagnosis not present

## 2020-09-27 DIAGNOSIS — Z6837 Body mass index (BMI) 37.0-37.9, adult: Secondary | ICD-10-CM

## 2020-09-27 DIAGNOSIS — N183 Chronic kidney disease, stage 3 unspecified: Secondary | ICD-10-CM | POA: Diagnosis present

## 2020-09-27 DIAGNOSIS — I160 Hypertensive urgency: Secondary | ICD-10-CM

## 2020-09-27 DIAGNOSIS — Z881 Allergy status to other antibiotic agents status: Secondary | ICD-10-CM

## 2020-09-27 DIAGNOSIS — Z794 Long term (current) use of insulin: Secondary | ICD-10-CM

## 2020-09-27 DIAGNOSIS — J810 Acute pulmonary edema: Secondary | ICD-10-CM | POA: Diagnosis not present

## 2020-09-27 DIAGNOSIS — Z8673 Personal history of transient ischemic attack (TIA), and cerebral infarction without residual deficits: Secondary | ICD-10-CM

## 2020-09-27 DIAGNOSIS — I313 Pericardial effusion (noninflammatory): Secondary | ICD-10-CM | POA: Diagnosis present

## 2020-09-27 DIAGNOSIS — Z7982 Long term (current) use of aspirin: Secondary | ICD-10-CM

## 2020-09-27 DIAGNOSIS — E11649 Type 2 diabetes mellitus with hypoglycemia without coma: Secondary | ICD-10-CM | POA: Diagnosis not present

## 2020-09-27 DIAGNOSIS — E1122 Type 2 diabetes mellitus with diabetic chronic kidney disease: Secondary | ICD-10-CM | POA: Diagnosis present

## 2020-09-27 DIAGNOSIS — Z833 Family history of diabetes mellitus: Secondary | ICD-10-CM

## 2020-09-27 DIAGNOSIS — I5043 Acute on chronic combined systolic (congestive) and diastolic (congestive) heart failure: Secondary | ICD-10-CM | POA: Diagnosis present

## 2020-09-27 DIAGNOSIS — R0602 Shortness of breath: Secondary | ICD-10-CM | POA: Diagnosis not present

## 2020-09-27 DIAGNOSIS — I152 Hypertension secondary to endocrine disorders: Secondary | ICD-10-CM

## 2020-09-27 DIAGNOSIS — Z79899 Other long term (current) drug therapy: Secondary | ICD-10-CM

## 2020-09-27 DIAGNOSIS — I161 Hypertensive emergency: Secondary | ICD-10-CM | POA: Diagnosis not present

## 2020-09-27 DIAGNOSIS — R0902 Hypoxemia: Secondary | ICD-10-CM | POA: Diagnosis not present

## 2020-09-27 DIAGNOSIS — I248 Other forms of acute ischemic heart disease: Secondary | ICD-10-CM | POA: Diagnosis present

## 2020-09-27 DIAGNOSIS — I10 Essential (primary) hypertension: Secondary | ICD-10-CM

## 2020-09-27 DIAGNOSIS — J9601 Acute respiratory failure with hypoxia: Principal | ICD-10-CM | POA: Diagnosis present

## 2020-09-27 DIAGNOSIS — Z8249 Family history of ischemic heart disease and other diseases of the circulatory system: Secondary | ICD-10-CM

## 2020-09-27 DIAGNOSIS — Z20822 Contact with and (suspected) exposure to covid-19: Secondary | ICD-10-CM | POA: Diagnosis present

## 2020-09-27 DIAGNOSIS — G4733 Obstructive sleep apnea (adult) (pediatric): Secondary | ICD-10-CM | POA: Diagnosis present

## 2020-09-27 DIAGNOSIS — I5042 Chronic combined systolic (congestive) and diastolic (congestive) heart failure: Secondary | ICD-10-CM

## 2020-09-27 DIAGNOSIS — I130 Hypertensive heart and chronic kidney disease with heart failure and stage 1 through stage 4 chronic kidney disease, or unspecified chronic kidney disease: Secondary | ICD-10-CM | POA: Diagnosis present

## 2020-09-27 DIAGNOSIS — E876 Hypokalemia: Secondary | ICD-10-CM | POA: Diagnosis not present

## 2020-09-27 DIAGNOSIS — R000 Tachycardia, unspecified: Secondary | ICD-10-CM | POA: Diagnosis not present

## 2020-09-27 DIAGNOSIS — Z91018 Allergy to other foods: Secondary | ICD-10-CM

## 2020-09-27 DIAGNOSIS — I499 Cardiac arrhythmia, unspecified: Secondary | ICD-10-CM | POA: Diagnosis not present

## 2020-09-27 DIAGNOSIS — Z9114 Patient's other noncompliance with medication regimen: Secondary | ICD-10-CM

## 2020-09-27 DIAGNOSIS — N179 Acute kidney failure, unspecified: Secondary | ICD-10-CM | POA: Diagnosis present

## 2020-09-27 DIAGNOSIS — I429 Cardiomyopathy, unspecified: Secondary | ICD-10-CM | POA: Diagnosis present

## 2020-09-27 DIAGNOSIS — R0689 Other abnormalities of breathing: Secondary | ICD-10-CM | POA: Diagnosis not present

## 2020-09-27 DIAGNOSIS — Z87891 Personal history of nicotine dependence: Secondary | ICD-10-CM

## 2020-09-27 MED ORDER — FUROSEMIDE 10 MG/ML IJ SOLN
INTRAMUSCULAR | Status: AC
  Filled 2020-09-27: qty 6

## 2020-09-27 MED ORDER — NITROGLYCERIN IN D5W 200-5 MCG/ML-% IV SOLN
INTRAVENOUS | Status: AC
  Administered 2020-09-27: 5 ug/min via INTRAVENOUS
  Filled 2020-09-27: qty 250

## 2020-09-27 MED ORDER — FUROSEMIDE 10 MG/ML IJ SOLN
60.0000 mg | Freq: Once | INTRAMUSCULAR | Status: AC
  Administered 2020-09-27: 60 mg via INTRAVENOUS

## 2020-09-27 MED ORDER — NITROGLYCERIN IN D5W 200-5 MCG/ML-% IV SOLN
0.0000 ug/min | INTRAVENOUS | Status: DC
  Administered 2020-09-27: 60 ug/min via INTRAVENOUS
  Administered 2020-09-28: 130 ug/min via INTRAVENOUS
  Filled 2020-09-27: qty 250

## 2020-09-28 ENCOUNTER — Emergency Department (HOSPITAL_COMMUNITY): Payer: Medicaid Other

## 2020-09-28 ENCOUNTER — Other Ambulatory Visit: Payer: Self-pay

## 2020-09-28 ENCOUNTER — Inpatient Hospital Stay (HOSPITAL_COMMUNITY): Payer: Medicaid Other

## 2020-09-28 ENCOUNTER — Encounter (HOSPITAL_COMMUNITY): Payer: Self-pay | Admitting: Internal Medicine

## 2020-09-28 DIAGNOSIS — I248 Other forms of acute ischemic heart disease: Secondary | ICD-10-CM | POA: Diagnosis not present

## 2020-09-28 DIAGNOSIS — G4733 Obstructive sleep apnea (adult) (pediatric): Secondary | ICD-10-CM | POA: Diagnosis not present

## 2020-09-28 DIAGNOSIS — R0603 Acute respiratory distress: Secondary | ICD-10-CM

## 2020-09-28 DIAGNOSIS — E11649 Type 2 diabetes mellitus with hypoglycemia without coma: Secondary | ICD-10-CM | POA: Diagnosis not present

## 2020-09-28 DIAGNOSIS — I509 Heart failure, unspecified: Secondary | ICD-10-CM | POA: Diagnosis not present

## 2020-09-28 DIAGNOSIS — Z8249 Family history of ischemic heart disease and other diseases of the circulatory system: Secondary | ICD-10-CM | POA: Diagnosis not present

## 2020-09-28 DIAGNOSIS — I161 Hypertensive emergency: Secondary | ICD-10-CM | POA: Diagnosis not present

## 2020-09-28 DIAGNOSIS — E1122 Type 2 diabetes mellitus with diabetic chronic kidney disease: Secondary | ICD-10-CM | POA: Diagnosis not present

## 2020-09-28 DIAGNOSIS — I5040 Unspecified combined systolic (congestive) and diastolic (congestive) heart failure: Secondary | ICD-10-CM | POA: Diagnosis not present

## 2020-09-28 DIAGNOSIS — I5042 Chronic combined systolic (congestive) and diastolic (congestive) heart failure: Secondary | ICD-10-CM | POA: Diagnosis not present

## 2020-09-28 DIAGNOSIS — I10 Essential (primary) hypertension: Secondary | ICD-10-CM | POA: Diagnosis not present

## 2020-09-28 DIAGNOSIS — I313 Pericardial effusion (noninflammatory): Secondary | ICD-10-CM | POA: Diagnosis not present

## 2020-09-28 DIAGNOSIS — Z20822 Contact with and (suspected) exposure to covid-19: Secondary | ICD-10-CM | POA: Diagnosis not present

## 2020-09-28 DIAGNOSIS — I5043 Acute on chronic combined systolic (congestive) and diastolic (congestive) heart failure: Secondary | ICD-10-CM | POA: Diagnosis not present

## 2020-09-28 DIAGNOSIS — Z9114 Patient's other noncompliance with medication regimen: Secondary | ICD-10-CM | POA: Diagnosis not present

## 2020-09-28 DIAGNOSIS — E876 Hypokalemia: Secondary | ICD-10-CM | POA: Diagnosis not present

## 2020-09-28 DIAGNOSIS — N179 Acute kidney failure, unspecified: Secondary | ICD-10-CM | POA: Diagnosis not present

## 2020-09-28 DIAGNOSIS — Z87891 Personal history of nicotine dependence: Secondary | ICD-10-CM | POA: Diagnosis not present

## 2020-09-28 DIAGNOSIS — R0600 Dyspnea, unspecified: Secondary | ICD-10-CM | POA: Diagnosis not present

## 2020-09-28 DIAGNOSIS — Z79899 Other long term (current) drug therapy: Secondary | ICD-10-CM | POA: Diagnosis not present

## 2020-09-28 DIAGNOSIS — I517 Cardiomegaly: Secondary | ICD-10-CM | POA: Diagnosis not present

## 2020-09-28 DIAGNOSIS — I130 Hypertensive heart and chronic kidney disease with heart failure and stage 1 through stage 4 chronic kidney disease, or unspecified chronic kidney disease: Secondary | ICD-10-CM | POA: Diagnosis not present

## 2020-09-28 DIAGNOSIS — R000 Tachycardia, unspecified: Secondary | ICD-10-CM | POA: Diagnosis not present

## 2020-09-28 DIAGNOSIS — Z833 Family history of diabetes mellitus: Secondary | ICD-10-CM | POA: Diagnosis not present

## 2020-09-28 DIAGNOSIS — N183 Chronic kidney disease, stage 3 unspecified: Secondary | ICD-10-CM | POA: Diagnosis not present

## 2020-09-28 DIAGNOSIS — Z8673 Personal history of transient ischemic attack (TIA), and cerebral infarction without residual deficits: Secondary | ICD-10-CM | POA: Diagnosis not present

## 2020-09-28 DIAGNOSIS — Z6837 Body mass index (BMI) 37.0-37.9, adult: Secondary | ICD-10-CM | POA: Diagnosis not present

## 2020-09-28 DIAGNOSIS — I429 Cardiomyopathy, unspecified: Secondary | ICD-10-CM | POA: Diagnosis not present

## 2020-09-28 DIAGNOSIS — Z91018 Allergy to other foods: Secondary | ICD-10-CM | POA: Diagnosis not present

## 2020-09-28 DIAGNOSIS — Z881 Allergy status to other antibiotic agents status: Secondary | ICD-10-CM | POA: Diagnosis not present

## 2020-09-28 DIAGNOSIS — J9601 Acute respiratory failure with hypoxia: Secondary | ICD-10-CM | POA: Diagnosis not present

## 2020-09-28 DIAGNOSIS — J810 Acute pulmonary edema: Secondary | ICD-10-CM | POA: Diagnosis not present

## 2020-09-28 LAB — COMPREHENSIVE METABOLIC PANEL
ALT: 26 U/L (ref 0–44)
ALT: 25 U/L (ref 0–44)
AST: 39 U/L (ref 15–41)
AST: 27 U/L (ref 15–41)
Albumin: 3.8 g/dL (ref 3.5–5.0)
Albumin: 3.3 g/dL — ABNORMAL LOW (ref 3.5–5.0)
Alkaline Phosphatase: 99 U/L (ref 38–126)
Alkaline Phosphatase: 74 U/L (ref 38–126)
Anion gap: 9 (ref 5–15)
Anion gap: 11 (ref 5–15)
BUN: 23 mg/dL — ABNORMAL HIGH (ref 6–20)
BUN: 25 mg/dL — ABNORMAL HIGH (ref 6–20)
CO2: 23 mmol/L (ref 22–32)
CO2: 23 mmol/L (ref 22–32)
Calcium: 8.9 mg/dL (ref 8.9–10.3)
Calcium: 8.7 mg/dL — ABNORMAL LOW (ref 8.9–10.3)
Chloride: 105 mmol/L (ref 98–111)
Chloride: 104 mmol/L (ref 98–111)
Creatinine, Ser: 2.44 mg/dL — ABNORMAL HIGH (ref 0.61–1.24)
Creatinine, Ser: 2.56 mg/dL — ABNORMAL HIGH (ref 0.61–1.24)
GFR, Estimated: 34 mL/min — ABNORMAL LOW (ref >60)
GFR, Estimated: 32 mL/min — ABNORMAL LOW (ref >60)
Glucose, Bld: 263 mg/dL — ABNORMAL HIGH (ref 70–99)
Glucose, Bld: 252 mg/dL — ABNORMAL HIGH (ref 70–99)
Potassium: 3.9 mmol/L (ref 3.5–5.1)
Potassium: 4 mmol/L (ref 3.5–5.1)
Sodium: 137 mmol/L (ref 135–145)
Sodium: 138 mmol/L (ref 135–145)
Total Bilirubin: 1 mg/dL (ref 0.3–1.2)
Total Bilirubin: 0.7 mg/dL (ref 0.3–1.2)
Total Protein: 8.3 g/dL — ABNORMAL HIGH (ref 6.5–8.1)
Total Protein: 7.3 g/dL (ref 6.5–8.1)

## 2020-09-28 LAB — CBC WITH DIFFERENTIAL/PLATELET
Abs Immature Granulocytes: 0.05 10*3/uL (ref 0.00–0.07)
Basophils Absolute: 0.1 10*3/uL (ref 0.0–0.1)
Basophils Relative: 1 %
Eosinophils Absolute: 0.2 10*3/uL (ref 0.0–0.5)
Eosinophils Relative: 2 %
HCT: 48.5 % (ref 39.0–52.0)
Hemoglobin: 16.1 g/dL (ref 13.0–17.0)
Immature Granulocytes: 0 %
Lymphocytes Relative: 44 %
Lymphs Abs: 5 10*3/uL — ABNORMAL HIGH (ref 0.7–4.0)
MCH: 27 pg (ref 26.0–34.0)
MCHC: 33.2 g/dL (ref 30.0–36.0)
MCV: 81.4 fL (ref 80.0–100.0)
Monocytes Absolute: 0.6 10*3/uL (ref 0.1–1.0)
Monocytes Relative: 5 %
Neutro Abs: 5.4 10*3/uL (ref 1.7–7.7)
Neutrophils Relative %: 48 %
Platelets: 337 10*3/uL (ref 150–400)
RBC: 5.96 MIL/uL — ABNORMAL HIGH (ref 4.22–5.81)
RDW: 13.4 % (ref 11.5–15.5)
WBC: 11.4 10*3/uL — ABNORMAL HIGH (ref 4.0–10.5)
nRBC: 0 % (ref 0.0–0.2)

## 2020-09-28 LAB — I-STAT VENOUS BLOOD GAS, ED
Acid-base deficit: 4 mmol/L — ABNORMAL HIGH (ref 0.0–2.0)
Bicarbonate: 23.2 mmol/L (ref 20.0–28.0)
Calcium, Ion: 1.14 mmol/L — ABNORMAL LOW (ref 1.15–1.40)
HCT: 46 % (ref 39.0–52.0)
Hemoglobin: 15.6 g/dL (ref 13.0–17.0)
O2 Saturation: 95 %
Potassium: 3.7 mmol/L (ref 3.5–5.1)
Sodium: 141 mmol/L (ref 135–145)
TCO2: 25 mmol/L (ref 22–32)
pCO2, Ven: 47.8 mmHg (ref 44.0–60.0)
pH, Ven: 7.295 (ref 7.250–7.430)
pO2, Ven: 82 mmHg — ABNORMAL HIGH (ref 32.0–45.0)

## 2020-09-28 LAB — CBG MONITORING, ED: Glucose-Capillary: 212 mg/dL — ABNORMAL HIGH (ref 70–99)

## 2020-09-28 LAB — GLUCOSE, CAPILLARY
Glucose-Capillary: 198 mg/dL — ABNORMAL HIGH (ref 70–99)
Glucose-Capillary: 189 mg/dL — ABNORMAL HIGH (ref 70–99)
Glucose-Capillary: 113 mg/dL — ABNORMAL HIGH (ref 70–99)
Glucose-Capillary: 168 mg/dL — ABNORMAL HIGH (ref 70–99)

## 2020-09-28 LAB — MRSA NEXT GEN BY PCR, NASAL: MRSA by PCR Next Gen: NOT DETECTED

## 2020-09-28 LAB — RESP PANEL BY RT-PCR (FLU A&B, COVID) ARPGX2
Influenza A by PCR: NEGATIVE
Influenza B by PCR: NEGATIVE
SARS Coronavirus 2 by RT PCR: NEGATIVE

## 2020-09-28 LAB — MAGNESIUM: Magnesium: 1.6 mg/dL — ABNORMAL LOW (ref 1.7–2.4)

## 2020-09-28 LAB — TROPONIN I (HIGH SENSITIVITY)
Troponin I (High Sensitivity): 57 ng/L — ABNORMAL HIGH (ref <18)
Troponin I (High Sensitivity): 176 ng/L (ref <18)

## 2020-09-28 LAB — HIV ANTIBODY (ROUTINE TESTING W REFLEX): HIV Screen 4th Generation wRfx: NONREACTIVE

## 2020-09-28 LAB — ECHOCARDIOGRAM COMPLETE
Height: 69 in
S' Lateral: 3.9 cm
Weight: 3929.48 oz

## 2020-09-28 LAB — PHOSPHORUS: Phosphorus: 4.2 mg/dL (ref 2.5–4.6)

## 2020-09-28 LAB — BRAIN NATRIURETIC PEPTIDE: B Natriuretic Peptide: 457.4 pg/mL — ABNORMAL HIGH (ref 0.0–100.0)

## 2020-09-28 MED ORDER — AMLODIPINE BESYLATE 10 MG PO TABS
10.0000 mg | ORAL_TABLET | Freq: Every day | ORAL | Status: DC
  Administered 2020-09-28 – 2020-10-03 (×6): 10 mg via ORAL
  Filled 2020-09-28 (×6): qty 1

## 2020-09-28 MED ORDER — MINOXIDIL 10 MG PO TABS
10.0000 mg | ORAL_TABLET | Freq: Two times a day (BID) | ORAL | Status: DC
  Administered 2020-09-28 – 2020-10-03 (×11): 10 mg via ORAL
  Filled 2020-09-28 (×14): qty 1

## 2020-09-28 MED ORDER — POLYETHYLENE GLYCOL 3350 17 G PO PACK: 17.0000 g | PACK | Freq: Every day | ORAL | Status: DC | PRN

## 2020-09-28 MED ORDER — ASPIRIN EC 81 MG PO TBEC
81.0000 mg | DELAYED_RELEASE_TABLET | Freq: Every day | ORAL | Status: DC
  Administered 2020-09-28 – 2020-10-03 (×6): 81 mg via ORAL
  Filled 2020-09-28 (×6): qty 1

## 2020-09-28 MED ORDER — ALBUTEROL SULFATE (2.5 MG/3ML) 0.083% IN NEBU
INHALATION_SOLUTION | RESPIRATORY_TRACT | Status: AC
  Administered 2020-09-28: 5 mg
  Filled 2020-09-28: qty 6

## 2020-09-28 MED ORDER — PNEUMOCOCCAL VAC POLYVALENT 25 MCG/0.5ML IJ INJ
0.5000 mL | INJECTION | INTRAMUSCULAR | Status: DC | PRN
  Filled 2020-09-28: qty 0.5

## 2020-09-28 MED ORDER — ORAL CARE MOUTH RINSE
15.0000 mL | Freq: Two times a day (BID) | OROMUCOSAL | Status: DC
  Administered 2020-09-28 – 2020-10-02 (×8): 15 mL via OROMUCOSAL

## 2020-09-28 MED ORDER — MAGNESIUM SULFATE 2 GM/50ML IV SOLN
2.0000 g | Freq: Once | INTRAVENOUS | Status: AC
  Administered 2020-09-28: 2 g via INTRAVENOUS
  Filled 2020-09-28: qty 50

## 2020-09-28 MED ORDER — INSULIN DETEMIR 100 UNIT/ML ~~LOC~~ SOLN
15.0000 [IU] | Freq: Two times a day (BID) | SUBCUTANEOUS | Status: DC
  Administered 2020-09-28 – 2020-09-29 (×4): 15 [IU] via SUBCUTANEOUS
  Filled 2020-09-28 (×5): qty 0.15

## 2020-09-28 MED ORDER — FUROSEMIDE 10 MG/ML IJ SOLN
80.0000 mg | Freq: Two times a day (BID) | INTRAMUSCULAR | Status: AC
  Administered 2020-09-28 (×2): 80 mg via INTRAVENOUS
  Filled 2020-09-28 (×2): qty 8

## 2020-09-28 MED ORDER — HEPARIN SODIUM (PORCINE) 5000 UNIT/ML IJ SOLN
5000.0000 [IU] | Freq: Three times a day (TID) | INTRAMUSCULAR | Status: DC
  Administered 2020-09-28 – 2020-10-03 (×16): 5000 [IU] via SUBCUTANEOUS
  Filled 2020-09-28 (×16): qty 1

## 2020-09-28 MED ORDER — CHLORHEXIDINE GLUCONATE CLOTH 2 % EX PADS
6.0000 | MEDICATED_PAD | Freq: Every day | CUTANEOUS | Status: DC
  Administered 2020-09-29: 15:00:00 6 via TOPICAL

## 2020-09-28 MED ORDER — CARVEDILOL 12.5 MG PO TABS
12.5000 mg | ORAL_TABLET | Freq: Two times a day (BID) | ORAL | Status: DC
  Administered 2020-09-28 – 2020-10-02 (×9): 12.5 mg via ORAL
  Filled 2020-09-28 (×9): qty 1

## 2020-09-28 MED ORDER — INSULIN ASPART 100 UNIT/ML IJ SOLN
2.0000 [IU] | INTRAMUSCULAR | Status: DC
  Administered 2020-09-28 (×3): 4 [IU] via SUBCUTANEOUS
  Administered 2020-09-28: 6 [IU] via SUBCUTANEOUS
  Administered 2020-09-30: 4 [IU] via SUBCUTANEOUS

## 2020-09-28 MED ORDER — DOCUSATE SODIUM 100 MG PO CAPS: 100.0000 mg | ORAL_CAPSULE | Freq: Two times a day (BID) | ORAL | Status: DC | PRN

## 2020-09-28 NOTE — Progress Notes (Signed)
Echocardiogram 2D Echocardiogram has been performed.  Oneal Deputy Storm Dulski RDCS 09/28/2020, 8:10 AM  Notified Drs. Margaretann Loveless and [place] of stat at 8:10

## 2020-09-28 NOTE — ED Provider Notes (Signed)
[facility] EMERGENCY DEPARTMENT Provider Note   CSN: [id number] Arrival date & time: 09/27/20  2342     History Chief Complaint  Patient presents with   Respiratory Distress    DERIOUS AHMANN is a 40 y.o. male.  The history is provided by the patient, the EMS personnel and medical records.  MERT SUGIMOTO is a 40 y.o. male who presents to the Emergency Department complaining of respiratory distress. Level V caveat due to respiratory distress. He presents the emergency department by EMS for evaluation of shortness of breath that began abruptly while he was at work. 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 EMS reports sats of 40% on room air on their arrival with increased work of breathing. He was started on CPAP with pulse ox improving to 70%. Patient denies any recent illnesses. He does state that he had a medication change recently.    Past Medical History:  Diagnosis Date   CKD (chronic kidney disease) stage 2, GFR 60-89 ml/min    Diabetes mellitus without complication ([place])    Ear pain 07/2017   Hypertension    Stroke ([place])    Wasp sting 08/04/2016   of dorsum of left hand near thumb    Patient Active Problem List   Diagnosis Date Noted   Hypertensive heart disease 01/12/2019   Ischemic stroke ([place]) 01/11/2019   Stroke ([place]) 01/11/2019   Hypertensive emergency 11/28/2018   Hypertensive crisis 11/27/2018   Obesity, Class III, BMI 40-49.9 (morbid obesity) ([place]) 11/27/2018   Mastoiditis 08/12/2017   Chronic kidney disease, stage 3a ([place]) 08/17/2016   Hypertension associated with diabetes ([place]) 08/06/2016   Diabetes ([place]) 08/06/2016    Past Surgical History:  Procedure Laterality Date   BUBBLE STUDY  02/02/2019   Procedure: BUBBLE STUDY;  Surgeon: Lelon Perla, MD;  Location: [place];  Service: Cardiovascular;;    TEE WITHOUT CARDIOVERSION N/A 02/02/2019   Procedure: TRANSESOPHAGEAL ECHOCARDIOGRAM (TEE);  Surgeon: Lelon Perla, MD;  [facility] ENDOSCOPY;  Service: Cardiovascular;  Laterality: N/A;       Family History  Problem Relation Age of Onset   Hypertension Mother    Diabetes Mother    Heart disease Mother    Heart attack Mother        young. unsure age   Hypertension Father    Heart disease Brother     Social History   Tobacco Use   Smoking status: Former    Types: Cigars    Quit date: 2010    Years since quitting: 12.5   Smokeless tobacco: Never  Vaping Use   Vaping Use: Never used  Substance Use Topics   Alcohol use: No   Drug use: No    Home Medications Prior to Admission medications   Medication Sig Start Date End Date Taking? Authorizing Provider  ASPIRIN 81 PO Take by mouth.    [provider]  carvedilol (COREG) 25 MG tablet Take 0.5 tablets (12.5 mg total) by mouth 2 (two) times daily with a meal. 09/04/20   Croitoru, Mihai, MD  Insulin Isophane & Regular Human (NOVOLIN 70/30 FLEXPEN) (70-30) 100 UNIT/ML PEN Inject 26 Units into the skin 2 (two) times daily. 01/13/19   [place], Carley D, DO  minoxidil (LONITEN) 10 MG tablet Take 1 tablet (10 mg total) by mouth 2 (  two) times daily. 09/04/20   Croitoru, Dani Gobble, MD    Allergies    Zithromax [azithromycin dihydrate] and Kiwi extract  Review of Systems   Review of Systems  All other systems reviewed and are negative.  Physical Exam Updated Vital Signs BP (!) 167/119   Pulse 100   Temp 97.7 F (36.5 C) (Axillary)   Resp (!) 26   Ht '5\' 9"'$  (1.753 m)   Wt 111.4 kg   SpO2 100%   BMI 36.27 kg/m   Physical Exam Vitals and nursing note reviewed.  Constitutional:      General: He is in acute distress.     Appearance: He is well-developed. He is ill-appearing.  HENT:     Head: Normocephalic and atraumatic.  Cardiovascular:     Rate and Rhythm: Regular rhythm. Tachycardia present.     Heart  sounds: No murmur heard. Pulmonary:     Effort: Respiratory distress present.     Breath sounds: Rales present.  Abdominal:     Palpations: Abdomen is soft.     Tenderness: There is no abdominal tenderness. There is no guarding or rebound.  Musculoskeletal:        General: No swelling or tenderness.  Skin:    General: Skin is warm and dry.  Neurological:     Mental Status: He is alert and oriented to person, place, and time.  Psychiatric:        Behavior: Behavior normal.    ED Results / Procedures / Treatments   Labs (all labs ordered are listed, but only abnormal results are displayed) Labs Reviewed  COMPREHENSIVE METABOLIC PANEL - Abnormal; Notable for the following components:      Result Value   Glucose, Bld 263 (*)    BUN 23 (*)    Creatinine, Ser 2.44 (*)    Total Protein 8.3 (*)    GFR, Estimated 34 (*)    All other components within normal limits  BRAIN NATRIURETIC PEPTIDE - Abnormal; Notable for the following components:   B Natriuretic Peptide 457.4 (*)    All other components within normal limits  CBC WITH DIFFERENTIAL/PLATELET - Abnormal; Notable for the following components:   WBC 11.4 (*)    RBC 5.96 (*)    Lymphs Abs 5.0 (*)    All other components within normal limits  I-STAT VENOUS BLOOD GAS, ED - Abnormal; Notable for the following components:   pO2, Ven 82.0 (*)    Acid-base deficit 4.0 (*)    Calcium, Ion 1.14 (*)    All other components within normal limits  CBG MONITORING, ED - Abnormal; Notable for the following components:   Glucose-Capillary 212 (*)    All other components within normal limits  TROPONIN I (HIGH SENSITIVITY) - Abnormal; Notable for the following components:   Troponin I (High Sensitivity) 57 (*)    All other components within normal limits  TROPONIN I (HIGH SENSITIVITY) - Abnormal; Notable for the following components:   Troponin I (High Sensitivity) 176 (*)    All other components within normal limits  RESP PANEL BY RT-PCR  (FLU A&B, COVID) ARPGX2  MRSA NEXT GEN BY PCR, NASAL  HIV ANTIBODY (ROUTINE TESTING W REFLEX)  COMPREHENSIVE METABOLIC PANEL  MAGNESIUM  PHOSPHORUS    EKG EKG Interpretation  Date/Time:  Friday September 27 2020 23:47:14 EDT Ventricular Rate:  138 PR Interval:  139 QRS Duration: 98 QT Interval:  285 QTC Calculation: 432 R Axis:   41 Text Interpretation: Sinus tachycardia LAE,  consider biatrial enlargement Anterior infarct, old Nonspecific T abnormalities, lateral leads Minimal ST elevation, lateral leads Baseline wander in lead(s) V2 Confirmed by Quintella Reichert [phone]) on 09/28/2020 12:16:45 AM  Radiology DG Chest Port 1 View  Result Date: 09/28/2020 CLINICAL DATA:  Dyspnea EXAM: PORTABLE CHEST 1 VIEW COMPARISON:  None. FINDINGS: There is bibasilar airspace infiltrate, asymmetrically more severe at the right lung base with dense consolidation in keeping with atypical infection or aspiration. No pneumothorax or pleural effusion. Mild cardiomegaly is stable. Pulmonary vascularity is normal. No acute bone abnormality. IMPRESSION: Asymmetric bibasilar pulmonary infiltrates with dense consolidation at the right lung base in keeping with atypical infection or aspiration. Electronically Signed   By: Fidela Salisbury MD   On: 09/28/2020 00:37    Procedures Procedures  CRITICAL CARE Performed by: Quintella Reichert   Total critical care time: 45 minutes  Critical care time was exclusive of separately billable procedures and treating other patients.  Critical care was necessary to treat or prevent imminent or life-threatening deterioration.  Critical care was time spent personally by me on the following activities: development of treatment plan with patient and/or surrogate as well as nursing, discussions with consultants, evaluation of patient's response to treatment, examination of patient, obtaining history from patient or surrogate, ordering and performing treatments and interventions, ordering  and review of laboratory studies, ordering and review of radiographic studies, pulse oximetry and re-evaluation of patient's condition.  Medications Ordered in ED Medications  nitroGLYCERIN 50 mg in dextrose 5 % 250 mL (0.2 mg/mL) infusion (120 mcg/min Intravenous Infusion Verify 09/28/20 0520)  furosemide (LASIX) 10 MG/ML injection (has no administration in time range)  docusate sodium (COLACE) capsule 100 mg (has no administration in time range)  polyethylene glycol (MIRALAX / GLYCOLAX) packet 17 g (has no administration in time range)  heparin injection 5,000 Units (5,000 Units Subcutaneous Given 09/28/20 0548)  amLODipine (NORVASC) tablet 10 mg (has no administration in time range)  minoxidil (LONITEN) tablet 10 mg (10 mg Oral Not Given 09/28/20 0233)  carvedilol (COREG) tablet 12.5 mg (has no administration in time range)  furosemide (LASIX) injection 80 mg (has no administration in time range)  insulin aspart (novoLOG) injection 2-6 Units (6 Units Subcutaneous Given 09/28/20 0418)  insulin detemir (LEVEMIR) injection 15 Units (15 Units Subcutaneous Given 09/28/20 0416)  aspirin EC tablet 81 mg (has no administration in time range)  pneumococcal 23 valent vaccine (PNEUMOVAX-23) injection 0.5 mL (has no administration in time range)  Chlorhexidine Gluconate Cloth 2 % PADS 6 each (has no administration in time range)  furosemide (LASIX) injection 60 mg (60 mg Intravenous Given 09/27/20 2351)  albuterol (PROVENTIL) (2.5 MG/3ML) 0.083% nebulizer solution (5 mg  Given 09/28/20 0053)    ED Course  I have reviewed the triage vital signs and the nursing notes.  Pertinent labs & imaging results that were available during my care of the patient were reviewed by me and considered in my medical decision making (see chart for details).    MDM Rules/Calculators/A&P                          patient with history of hypertension, CKD here for evaluation of difficulty breathing that started abruptly.  Patient in respiratory distress on ED arrival with increased work of breathing, hypoxia, crackles, tachycardia and tachypnea. He has diffuse rales on lung exam. He was transitioned to BiPAP and started on nitroglycerin drip and treated with Lasix for hypertensive urgency and flash  pulmonary edema. He was recently assessed multiple times with progressive improvement in his symptoms. Current clinical picture is not consistent with PE, pneumonia. BNP is slightly elevated. Creatinine is slightly elevated compared to baseline. On reassessment patient is able to communicate better and states that he did forget his medications today. Patient is significantly improved but does still require BiPAP. Plan to admit to critical care for ongoing treatment.  Final Clinical Impression(s) / ED Diagnoses Final diagnoses:  Acute respiratory failure with hypoxia ([place])  Flash pulmonary edema ([place])  Hypertensive urgency    Rx / DC Orders ED Discharge Orders     None        Quintella Reichert, MD 09/28/20 [phone]

## 2020-09-28 NOTE — H&P (Signed)
NAME:  Harold Clements, MRN:  [id number], DOB:  1981-02-09, LOS: 0 ADMISSION DATE:  09/27/2020, CONSULTATION DATE:  09/28/2020 REFERRING MD:  Ralene Bathe, CHIEF COMPLAINT:  Respiratory distress   History of Present Illness:  This is a 41 year old man with a history of uncontrolled hypertension, combined systolic and diastolic heart failure, diabetes mellitus on insulin, CKD stage III, ischemic stroke who presents with hypertensive emergency.  He had acute onset of shortness of breath which started just prior to arrival.  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  Coworkers found him in the parking lot in severe respiratory distress.  EMS was called to scene.  SPO2 on scene was 40%.  He was placed on noninvasive ventilation with improvement of his saturations and brought to the emergency department.  On arrival his BP was noted to be in systolic 123456 and diastolic of [id number] chest x-ray showed bilateral opacities.  He was started on nitro drip and continued on BiPAP therapy and critical care was called for admission.  He missed his antihypertensive medications today.  He just forgot to take them.  His significant other is at the bedside.  He has a history of noncompliance.  Sometimes he takes his medications and sometimes he does not.  He recently saw his cardiologist who has him currently on minoxidil 10 mg twice daily, Coreg 12.5 mg twice daily.  He has no chest pain, leg pain or swelling.  No sick contacts.  No fever no cough.  No history of PE/DVT.    Pertinent  Medical History  CKD III HTN DM on insulin Combined systolic and diastolic heart failure  OSA Ischemic stroke   Objective   Blood pressure (!) 175/126, pulse (!) 117, temperature (!) 97.5 F (36.4 C), temperature source Axillary, resp. rate (!) 41, height '5\' 9"'$  (1.753 m), weight 118.8 kg, SpO2 95 %.    Vent Mode: BIPAP FiO2 (%):  [100 %] 100 % Set Rate:  [16 bmp] 16 bmp PEEP:  [5  cmH20] 5 cmH20  No intake or output data in the 24 hours ending 09/28/20 0222 Filed Weights   09/28/20 0013  Weight: 118.8 kg    Examination: Constitutional:   Mild respiratory distress.  Speaking in short sentences. HEENT: No icterus.  Trachea midline. Cardiovascular: Tachycardic. Pulmonary: Respiratory distress.  Speaking in short sentences.  Accessory muscle use. Abdominal: Soft nontender.  Nondistended.   Skin: General: Skin is warm and dry. Neurological: Alert and oriented to person place time and event.   Extremities: Mild bilateral lower extremity swelling   Labs   CBC: Recent Labs  Lab 09/27/20 2350 09/28/20 0011  WBC 11.4*  --   NEUTROABS 5.4  --   HGB 16.1 15.6  HCT 48.5 46.0  MCV 81.4  --   PLT 337  --     Basic Metabolic Panel: Recent Labs  Lab 09/27/20 2350 09/28/20 0011  NA 137 141  K 3.9 3.7  CL 105  --   CO2 23  --   GLUCOSE 263*  --   BUN 23*  --   CREATININE 2.44*  --   CALCIUM 8.9  --    GFR: Estimated Creatinine Clearance: 52.7 mL/min (A) (by C-G formula based on SCr of 2.44 mg/dL (H)). Recent Labs  Lab 09/27/20 2350  WBC 11.4*    Liver Function Tests: Recent Labs  Lab 09/27/20 2350  AST 39  ALT 26  ALKPHOS 99  BILITOT 1.0  PROT 8.3*  ALBUMIN 3.8     ABG    Component Value Date/Time   HCO3 23.2 09/28/2020 0011   TCO2 25 09/28/2020 0011   ACIDBASEDEF 4.0 (H) 09/28/2020 0011   O2SAT 95.0 09/28/2020 0011     HbA1C: Hgb A1c MFr Bld  Date/Time Value Ref Range Status  11/28/2019 11:14 AM 9.4 (H) 4.8 - 5.6 % Final    Comment:             Prediabetes: 5.7 - 6.4          Diabetes: >6.4          Glycemic control for adults with diabetes: <7.0   01/12/2019 03:39 AM 8.8 (H) 4.8 - 5.6 % Final    Comment:    (NOTE) Pre diabetes:          5.7%-6.4% Diabetes:              >6.4% Glycemic control for   <7.0% adults with diabetes     Review of Systems:   12 point ROS negative unless stated in HPI  Past Medical History:   He,  has a past medical history of CKD (chronic kidney disease) stage 2, GFR 60-89 ml/min, Diabetes mellitus without complication ([place]), Ear pain (07/2017), Hypertension, Stroke [facility]), and Wasp sting (08/04/2016).   Surgical History:   Past Surgical History:  Procedure Laterality Date   BUBBLE STUDY  02/02/2019   Procedure: BUBBLE STUDY;  Surgeon: Lelon Perla, MD;  Location: [facility] ENDOSCOPY;  Service: Cardiovascular;;   TEE WITHOUT CARDIOVERSION N/A 02/02/2019   Procedure: TRANSESOPHAGEAL ECHOCARDIOGRAM (TEE);  Surgeon: Lelon Perla, MD;  Location: [facility] ENDOSCOPY;  Service: Cardiovascular;  Laterality: N/A;     Social History:   reports that he quit smoking about 12 years ago. His smoking use included cigars. He has never used smokeless tobacco. He reports that he does not drink alcohol and does not use drugs.   Family History:  His family history includes Diabetes in his mother; Heart attack in his mother; Heart disease in his brother and mother; Hypertension in his father and mother.   Allergies Allergies  Allergen Reactions   Zithromax [Azithromycin Dihydrate] Anaphylaxis   Kiwi Extract Other (See Comments)    Tongue goes numb     Home Medications  Prior to Admission medications   Medication Sig Start Date End Date Taking? Authorizing Provider  ASPIRIN 81 PO Take by mouth.    [provider]  carvedilol (COREG) 25 MG tablet Take 0.5 tablets (12.5 mg total) by mouth 2 (two) times daily with a meal. 09/04/20   Croitoru, Mihai, MD  Insulin Isophane & Regular Human (NOVOLIN 70/30 FLEXPEN) (70-30) 100 UNIT/ML PEN Inject 26 Units into the skin 2 (two) times daily. 01/13/19   [place], Carley D, DO  minoxidil (LONITEN) 10 MG tablet Take 1 tablet (10 mg total) by mouth 2 (two) times daily. 09/04/20   Croitoru, Dani Gobble, MD    Assessment & Plan:  Hypertensive emergency Acute hypoxemic respiratory failure Cardiogenic pulmonary edema Myocardial injury  diabetes mellitus on  insulin  OSA CKD III  history of ischemic stroke   40 year old man with history of uncontrolled hypertension, and diastolic heart failure admitted for hypertensive emergency with acute hypoxemia and cardiac pulmonary edema after missing his antihypertensive medications.  Start nitro drip.  Titrate to systolic blood pressure of 99991111 and or diastolic less than [id number]. Will restart his home medications minoxidil 10 mg twice  daily, amlodipine 10 mg daily, Coreg 12.5 mg daily and wean off the nitro drip when BP is at goal. Will support his respiratory status with BiPAP 14/5 100%.  Alternate BiPAP with high flow nasal cannula.  Troponin is elevated.  It is likely due to type II myocardial injury.  No ST elevation on EKG.  No chest pain. Repeat trop pending.  Lasix 80 mg IV twice daily.  For his diabetes he is on 70/30 mix insulin 26 units twice daily.  Start Levemir 15 units twice daily along with correctional scale. Aspirin 18 mg daily  Heart healthy diet Heparin 5000 units for VTE prophylaxis    Best Practice (right click and "Reselect all SmartList Selections" daily)   Diet/type: Regular consistency (see orders) DVT prophylaxis: prophylactic heparin  GI prophylaxis: N/A Lines: N/A Foley:  N/A Code Status:  full code    Critical care time: 30 minutes

## 2020-09-28 NOTE — Progress Notes (Signed)
Patient arrived from ED at 05:00Am. Patient placed on COVID rule out isolation precautions. Patient arrived on Nitro @ 167mg. Report taken from [facility] Vitals taken. Will continue to monitor.

## 2020-09-28 NOTE — Progress Notes (Signed)
Patient placed on BIPAP for the night.

## 2020-09-28 NOTE — Progress Notes (Signed)
eLink Physician-Brief Progress Note Patient Name: Harold Clements DOB: 25-Feb-1981 MRN: [id number]   Date of Service  09/28/2020  HPI/Events of Note  4 M obese (BMI 39), history of hypertension and diabetes (A1C 9.4), combined HF EF 45-50%, CKD, CVA, presents with acute shortness of breath. O2 40% on EMS arrival, placed on NIV. BP noted to be in the 200s in the ED. Pulmonary edema on CXR. Trop 176, creatinine 2.44. Started on nitro drip and given lasix 60 mg IV and has diuresed. Patient admits to have missed taking his medications.  eICU Interventions  Hypertensive emergency, NSTEMI, acute on CKD, pulmonary edema. Oral meds ordered but unable to give at this time as on NIV. Continued on nitro drip.     Intervention Category Major Interventions: Hypertension - evaluation and management;Respiratory failure - evaluation and management Evaluation Type: New Patient Evaluation  Judd Lien 09/28/2020, 5:12 AM

## 2020-09-28 NOTE — Progress Notes (Signed)
Patient asking to come off Bipap to eat dinner.  RT titrated off Bipap and placed patient on a [place] 4L. Patient tolerating well at this time, no increased WOB.  RN at bedside will slowly introduce small sips of water and then progress to patient's meal if patient continues to tolerate well.

## 2020-09-28 NOTE — Progress Notes (Signed)
Pt transported form ED 35 to [id number] with no complications.

## 2020-09-28 NOTE — Progress Notes (Signed)
Pharmacy note: electrolyte replacement  -Mg= 1.6 -SCr= 2.56 (baseline ~ 1.9-2.1)  Plan -Magnesium 2gm IV today -Recheck Mg in am  Hildred Laser, PharmD Clinical Pharmacist **Pharmacist phone directory can now be found on [email or web address] ([email or web address]).  Listed under [place].

## 2020-09-28 NOTE — Progress Notes (Signed)
PCCM Update: - Patient continuing to wean down on nitro drip - He is tolerating BiPAP therapy well, continue PRN when sleeping - Continue Lasix 80 mg IV twice daily for diuresis - We will continue his home oral blood pressure medications  Freda Jackson, MD [facility] Office: [phone]   See Amion for personal pager PCCM on call pager [phone] until 7pm. Please call Elink 7p-7a. [phone]

## 2020-09-29 DIAGNOSIS — I161 Hypertensive emergency: Secondary | ICD-10-CM | POA: Diagnosis not present

## 2020-09-29 LAB — BASIC METABOLIC PANEL
Anion gap: 12 (ref 5–15)
BUN: 27 mg/dL — ABNORMAL HIGH (ref 6–20)
CO2: 26 mmol/L (ref 22–32)
Calcium: 9 mg/dL (ref 8.9–10.3)
Chloride: 101 mmol/L (ref 98–111)
Creatinine, Ser: 2.92 mg/dL — ABNORMAL HIGH (ref 0.61–1.24)
GFR, Estimated: 27 mL/min — ABNORMAL LOW (ref >60)
Glucose, Bld: 84 mg/dL (ref 70–99)
Potassium: 3.6 mmol/L (ref 3.5–5.1)
Sodium: 139 mmol/L (ref 135–145)

## 2020-09-29 LAB — CBC
HCT: 43.5 % (ref 39.0–52.0)
Hemoglobin: 15 g/dL (ref 13.0–17.0)
MCH: 27.4 pg (ref 26.0–34.0)
MCHC: 34.5 g/dL (ref 30.0–36.0)
MCV: 79.5 fL — ABNORMAL LOW (ref 80.0–100.0)
Platelets: 271 10*3/uL (ref 150–400)
RBC: 5.47 MIL/uL (ref 4.22–5.81)
RDW: 13.4 % (ref 11.5–15.5)
WBC: 7.9 10*3/uL (ref 4.0–10.5)
nRBC: 0 % (ref 0.0–0.2)

## 2020-09-29 LAB — PHOSPHORUS: Phosphorus: 3.7 mg/dL (ref 2.5–4.6)

## 2020-09-29 LAB — POCT I-STAT 7, (LYTES, BLD GAS, ICA,H+H)
Acid-Base Excess: 3 mmol/L — ABNORMAL HIGH (ref 0.0–2.0)
Bicarbonate: 28.5 mmol/L — ABNORMAL HIGH (ref 20.0–28.0)
Calcium, Ion: 1.21 mmol/L (ref 1.15–1.40)
HCT: 42 % (ref 39.0–52.0)
Hemoglobin: 14.3 g/dL (ref 13.0–17.0)
O2 Saturation: 93 %
Patient temperature: 98.7
Potassium: 3.6 mmol/L (ref 3.5–5.1)
Sodium: 139 mmol/L (ref 135–145)
TCO2: 30 mmol/L (ref 22–32)
pCO2 arterial: 45.4 mmHg (ref 32.0–48.0)
pH, Arterial: 7.407 (ref 7.350–7.450)
pO2, Arterial: 67 mmHg — ABNORMAL LOW (ref 83.0–108.0)

## 2020-09-29 LAB — GLUCOSE, CAPILLARY
Glucose-Capillary: 114 mg/dL — ABNORMAL HIGH (ref 70–99)
Glucose-Capillary: 173 mg/dL — ABNORMAL HIGH (ref 70–99)
Glucose-Capillary: 192 mg/dL — ABNORMAL HIGH (ref 70–99)
Glucose-Capillary: 72 mg/dL (ref 70–99)
Glucose-Capillary: 79 mg/dL (ref 70–99)
Glucose-Capillary: 90 mg/dL (ref 70–99)
Glucose-Capillary: 90 mg/dL (ref 70–99)

## 2020-09-29 LAB — MAGNESIUM: Magnesium: 2 mg/dL (ref 1.7–2.4)

## 2020-09-29 LAB — HEMOGLOBIN A1C
Hgb A1c MFr Bld: 7.6 % — ABNORMAL HIGH (ref 4.8–5.6)
Mean Plasma Glucose: 171.42 mg/dL

## 2020-09-29 MED ORDER — INSULIN DETEMIR 100 UNIT/ML ~~LOC~~ SOLN
12.0000 [IU] | Freq: Two times a day (BID) | SUBCUTANEOUS | Status: DC
  Administered 2020-09-29: 12 [IU] via SUBCUTANEOUS
  Filled 2020-09-29 (×3): qty 0.12

## 2020-09-29 NOTE — Plan of Care (Signed)

## 2020-09-29 NOTE — Progress Notes (Signed)
Pt blood gas as follows PH:7.40 CO2:45.2 PaO2:67 Bicarb:28.5

## 2020-09-29 NOTE — Progress Notes (Signed)
NAME:  Harold Clements, MRN:  [id number], DOB:  Jul 05, 1980, LOS: 1 ADMISSION DATE:  09/27/2020, CONSULTATION DATE:  09/28/20 REFERRING MD:  ER doc CHIEF COMPLAINT:  respiratory distress   History of Present Illness:  This is a 40 year old man with a history of uncontrolled hypertension, combined systolic and diastolic heart failure, diabetes mellitus on insulin, CKD stage III, ischemic stroke who presents with hypertensive emergency.  He had acute onset of shortness of breath which started just prior to arrival.  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  Coworkers found him in the parking lot in severe respiratory distress.  EMS was called to scene.  SPO2 on scene was 40%.  He was placed on noninvasive ventilation with improvement of his saturations and brought to the emergency department.  On arrival his BP was noted to be in systolic 123456 and diastolic of 123XX123 chest x-ray showed bilateral opacities.  He was started on nitro drip and continued on BiPAP therapy and critical care was called for admission.  He missed his antihypertensive medications today.  He just forgot to take them.  His significant other is at the bedside.  He has a history of noncompliance.  Sometimes he takes his medications and sometimes he does not.  He recently saw his cardiologist who has him currently on minoxidil 10 mg twice daily, Coreg 12.5 mg twice daily.  He has no chest pain, leg pain or swelling.  No sick contacts.  No fever no cough.  No history of PE/DVT.   Pertinent  Medical History  CKD III HTN DM on insulin Combined systolic and diastolic heart failure OSA Ischemic stroke   Significant Hospital Events: Including procedures, antibiotic start and stop dates in addition to other pertinent events   7/30 admitted to ICU requiring nitro drip for HTN  Interim History / Subjective:  Patient has been weaned of nitro drip and blood pressure is better  controlled on oral medications.   He complains of being more sleepy than normal. He doses off during our conversation.  Objective   Blood pressure (!) 146/46, pulse 89, temperature 98.4 F (36.9 C), temperature source Oral, resp. rate (!) 34, height '5\' 9"'$  (1.753 m), weight 118.9 kg, SpO2 100 %.    Vent Mode: BIPAP;PCV FiO2 (%):  [40 %] 40 % Set Rate:  [12 bmp] 12 bmp PEEP:  [5 cmH20] 5 cmH20   Intake/Output Summary (Last 24 hours) at 09/29/2020 1148 Last data filed at 09/29/2020 0800 Gross per 24 hour  Intake 297 ml  Output 2400 ml  Net -2103 ml   Filed Weights   09/28/20 0013 09/28/20 0519 09/29/20 0500  Weight: 118.8 kg 111.4 kg 118.9 kg    Examination: General: no acute distress, obese male HENT: [place]/AT, sclera anicteric, moist mucous membranes Lungs: diminished breath sounds, no wheezing Cardiovascular: rrr, no murmurs Abdomen: soft, non-tender, non-distended, BS+ Extremities: warm, no edema Neuro: alert, oriented but somnolent. Moving all extremities GU: deferred  Resolved Hospital Problem list     Assessment & Plan:  Acute Hypoxemic Respiratory failure In setting of hypertensive emergency and pulmonary edema - He has diuresed well since admission, hold further lasix - BiPAP when sleeping - Check ABG for somnolence  Hypertensive Emergency In setting of missed medication doses - continue home antihypertensive regimen: amlodipine, coreg, minoxidil - He has been weaned of nitro drip  Acute Kidney Injury on CKDIIIa In setting of hypertensive  emergency - Cr continue to rise, will continue to monitor  Obstructive sleep apnea - continue Bipap when sleeping  DMII - decrease detemir to 12 units BID due to borderline low glucose readings - SSI  Best Practice (right click and "Reselect all SmartList Selections" daily)   Diet/type: Regular consistency (see orders) DVT prophylaxis: prophylactic heparin  GI prophylaxis: N/A Lines: N/A Foley:  N/A Code Status:   full code Last date of multidisciplinary goals of care discussion [n/a]  Labs   CBC: Recent Labs  Lab 09/27/20 2350 09/28/20 0011 09/29/20 0250  WBC 11.4*  --  7.9  NEUTROABS 5.4  --   --   HGB 16.1 15.6 15.0  HCT 48.5 46.0 43.5  MCV 81.4  --  79.5*  PLT 337  --  [id number]    Basic Metabolic Panel: Recent Labs  Lab 09/27/20 2350 09/28/20 0011 09/28/20 0201 09/29/20 0250  NA 137 141 138 139  K 3.9 3.7 4.0 3.6  CL 105  --  104 101  CO2 23  --  23 26  GLUCOSE 263*  --  252* 84  BUN 23*  --  25* 27*  CREATININE 2.44*  --  2.56* 2.92*  CALCIUM 8.9  --  8.7* 9.0  MG  --   --  1.6* 2.0  PHOS  --   --  4.2 3.7   GFR: Estimated Creatinine Clearance: 44.1 mL/min (A) (by C-G formula based on SCr of 2.92 mg/dL (H)). Recent Labs  Lab 09/27/20 2350 09/29/20 0250  WBC 11.4* 7.9    Liver Function Tests: Recent Labs  Lab 09/27/20 2350 09/28/20 0201  AST 39 27  ALT 26 25  ALKPHOS 99 74  BILITOT 1.0 0.7  PROT 8.3* 7.3  ALBUMIN 3.8 3.3*   No results for input(s): LIPASE, AMYLASE in the last 168 hours. No results for input(s): AMMONIA in the last 168 hours.  ABG    Component Value Date/Time   HCO3 23.2 09/28/2020 0011   TCO2 25 09/28/2020 0011   ACIDBASEDEF 4.0 (H) 09/28/2020 0011   O2SAT 95.0 09/28/2020 0011     Coagulation Profile: No results for input(s): INR, PROTIME in the last 168 hours.  Cardiac Enzymes: No results for input(s): CKTOTAL, CKMB, CKMBINDEX, TROPONINI in the last 168 hours.  HbA1C: Hgb A1c MFr Bld  Date/Time Value Ref Range Status  09/29/2020 02:50 AM 7.6 (H) 4.8 - 5.6 % Final    Comment:    (NOTE) Pre diabetes:          5.7%-6.4%  Diabetes:              >6.4%  Glycemic control for   <7.0% adults with diabetes   11/28/2019 11:14 AM 9.4 (H) 4.8 - 5.6 % Final    Comment:             Prediabetes: 5.7 - 6.4          Diabetes: >6.4          Glycemic control for adults with diabetes: <7.0     CBG: Recent Labs  Lab 09/28/20 1932  09/29/20 0004 09/29/20 0359 09/29/20 0742 09/29/20 1144  GLUCAP 168* 72 79 90 114*      Critical care time: n/a    Freda Jackson, MD [place] [facility] Office: [phone]   See Amion for personal pager PCCM on call pager [phone] until 7pm. Please call Elink 7p-7a. [phone]

## 2020-09-30 DIAGNOSIS — I10 Essential (primary) hypertension: Secondary | ICD-10-CM | POA: Diagnosis not present

## 2020-09-30 DIAGNOSIS — I161 Hypertensive emergency: Secondary | ICD-10-CM | POA: Diagnosis not present

## 2020-09-30 LAB — GLUCOSE, CAPILLARY
Glucose-Capillary: 50 mg/dL — ABNORMAL LOW (ref 70–99)
Glucose-Capillary: 59 mg/dL — ABNORMAL LOW (ref 70–99)
Glucose-Capillary: 87 mg/dL (ref 70–99)
Glucose-Capillary: 88 mg/dL (ref 70–99)
Glucose-Capillary: 286 mg/dL — ABNORMAL HIGH (ref 70–99)
Glucose-Capillary: 89 mg/dL (ref 70–99)
Glucose-Capillary: 183 mg/dL — ABNORMAL HIGH (ref 70–99)

## 2020-09-30 LAB — CBC
HCT: 43.7 % (ref 39.0–52.0)
Hemoglobin: 14.5 g/dL (ref 13.0–17.0)
MCH: 26.8 pg (ref 26.0–34.0)
MCHC: 33.2 g/dL (ref 30.0–36.0)
MCV: 80.6 fL (ref 80.0–100.0)
Platelets: 248 10*3/uL (ref 150–400)
RBC: 5.42 MIL/uL (ref 4.22–5.81)
RDW: 13.4 % (ref 11.5–15.5)
WBC: 5.8 10*3/uL (ref 4.0–10.5)
nRBC: 0 % (ref 0.0–0.2)

## 2020-09-30 LAB — BASIC METABOLIC PANEL
Anion gap: 9 (ref 5–15)
BUN: 37 mg/dL — ABNORMAL HIGH (ref 6–20)
CO2: 25 mmol/L (ref 22–32)
Calcium: 8.7 mg/dL — ABNORMAL LOW (ref 8.9–10.3)
Chloride: 103 mmol/L (ref 98–111)
Creatinine, Ser: 3.53 mg/dL — ABNORMAL HIGH (ref 0.61–1.24)
GFR, Estimated: 22 mL/min — ABNORMAL LOW (ref >60)
Glucose, Bld: 120 mg/dL — ABNORMAL HIGH (ref 70–99)
Potassium: 3.6 mmol/L (ref 3.5–5.1)
Sodium: 137 mmol/L (ref 135–145)

## 2020-09-30 MED ORDER — HYDRALAZINE HCL 50 MG PO TABS
100.0000 mg | ORAL_TABLET | Freq: Three times a day (TID) | ORAL | Status: DC
  Administered 2020-09-30 – 2020-10-03 (×9): 100 mg via ORAL
  Filled 2020-09-30 (×9): qty 2

## 2020-09-30 MED ORDER — DEXTROSE 50 % IV SOLN
25.0000 g | INTRAVENOUS | Status: AC
  Administered 2020-09-30: 25 g via INTRAVENOUS

## 2020-09-30 MED ORDER — INSULIN ASPART 100 UNIT/ML IJ SOLN
0.0000 [IU] | Freq: Three times a day (TID) | INTRAMUSCULAR | Status: DC
  Administered 2020-09-30: 8 [IU] via SUBCUTANEOUS
  Administered 2020-10-01: 3 [IU] via SUBCUTANEOUS
  Administered 2020-10-01: 5 [IU] via SUBCUTANEOUS
  Administered 2020-10-01 – 2020-10-03 (×5): 3 [IU] via SUBCUTANEOUS

## 2020-09-30 MED ORDER — DEXTROSE 50 % IV SOLN
INTRAVENOUS | Status: AC
  Filled 2020-09-30: qty 50

## 2020-09-30 NOTE — Progress Notes (Signed)
Inpatient Diabetes Program Recommendations  AACE/ADA: New Consensus Statement on Inpatient Glycemic Control (2015)  Target Ranges:  Prepandial:   less than 140 mg/dL      Peak postprandial:   less than 180 mg/dL (1-2 hours)      Critically ill patients:  140 - 180 mg/dL   Lab Results  Component Value Date   GLUCAP 286 (H) 09/30/2020   HGBA1C 7.6 (H) 09/29/2020    Review of Glycemic Control  Diabetes history: DM2 Outpatient Diabetes medications: Novolin 70/30 26 units BID Current orders for Inpatient glycemic control: Novolog 0-15 units TID with meals.  CBGs 88, 286. Had hypo of 50, 59 mg/dL this am. HgbA1C - 7.6%  Inpatient Diabetes Program Recommendations:    Levemir 8 units BID Add Novolog 3 units TID with meals.  Spoke with pt about taking insulin and checking blood sugars at home. Pt states he forgets to take his insulin most of the time. Ran out of strips to check his blood sugars. Discussed HgbA1C of 7.6% - average blood sugar of 171 mg/dL. Pt states he will try to do better when he goes home. Wife was not present, but would like to speak with her.  Continue to follow.   Thank you. Lorenda Peck, RD, LDN, CDE Inpatient Diabetes Coordinator [phone]

## 2020-09-30 NOTE — Progress Notes (Signed)
PROGRESS NOTE    Harold Clements  [id number] DOB: 1980/11/28 DOA: 09/27/2020 PCP: Kerin Perna, NP   Chief Complaint  Patient presents with   Respiratory Distress   Brief Narrative: 40 year old male with history of uncontrolled hypertension combined systolic/diastolic CHF, [id number] on insulin, CKD stage III, ischemic stroke, history of noncompliance, admitted with sudden onset of shortness of breath and uncontrolled hypertension in 200 needing BiPAP nitro drip and was admitted to ICU.  Patient with diarrhea weaned off nitro drip and BiPAP.  He has been stabilized and transferred to [facility] service 09/30/20  Subjective: Seen and examined.  Wife is at the bedside.  He is on 2 L nasal cannula Overnight episode of hypoglycemia blood pressure fairly stable on 2 L nasal cannula.  No new complaints. Denies chest pain shortness of breath. Assessment & Plan:  Acute hypoxic respiratory failure due to hypertensive emergency/pulmonary edema: Weaned off BiPAP.  On 2 L of cannula wean as tolerated  Hypertensive emergency/Acute Pulmonary edema Weaned off nitro drip.Off Lasix.Continue with multiple antihypertensive regimen-on amlodipine 10, carvedilol 12.5 mg twice daily, minoxidil currently.  He is also on valsartan and Aldactone at home currently holding due to  AKI. He is on hydralazine and we can resume if needed. Monitor his daily weight intake output AS BELOW- Net IO Since Admission: -3,677.86 mL [09/30/20 0930]  Filed Weights   09/29/20 0500 09/29/20 1832 09/30/20 0413  Weight: 118.9 kg 116.3 kg 116.6 kg   Intake/Output Summary (Last 24 hours) at 09/30/2020 0930 Last data filed at 09/30/2020 0900 Gross per 24 hour  Intake 720 ml  Output 1650 ml  Net -930 ml     Diabetes mellitus on [place]-term insulin with hyporglycemia: On 26 units  Mix insulin twice daily insulin at home- was placed on 12 units Lantus twice daily but will stop it.   Cont on sliding scale insulin and slowly uptitrate his regimen.   His wife tells me he needs prescription for his insulin Recent Labs  Lab 09/29/20 2348 09/30/20 0412 09/30/20 0435 09/30/20 0510 09/30/20 0742  GLUCAP 173* 50* 59* 87 88   Lab Results  Component Value Date   HGBA1C 7.6 (H) 09/29/2020     Chronic combined systolic/diastolic CHF: With pulmonary edema due to ongoing hypotension.  Continue to adjust his antihypertensive regimen.  Diuretics as tolerated holding due to AKI currently.  AKI on CKD stage IIIa: Baseline creatinine around 1.9 in 11/2019.  Followed by [facility].  BUN creatinine uptrending as below- suspect from htn emergency/Lasix.Monitor if worsens further obtain nephrology consultation ischemic stroke, history of noncompliance. Recent Labs    11/28/19 1114 09/27/20 2350 09/28/20 0201 09/29/20 0250 09/30/20 0110  BUN 21* 23* 25* 27* 37*  CREATININE 1.98* 2.44* 2.56* 2.92* 3.53*    Morbid obesity BMI 37: Will benefit weight loss, PCP follow-up OSA-PCCM advised BiPAP when sleeping.  He uses CPAP at home Noncompliance -he is aware that he will need to be more compliant with medication and his wife was reinforcing. Diet Order             Diet heart healthy/carb modified Room service appropriate? Yes; Fluid consistency: Thin  Diet effective now                    Patient's Body mass index is 37.97 kg/m.   DVT prophylaxis: heparin injection 5,000 Units Start: 09/28/20 0600 SCDs Start: 09/28/20 0140 Code Status:   Code Status: Full Code  Family Communication: plan of care  discussed with patient and his wife at bedside. Status is: Inpatient Remains inpatient appropriate because:Persistent severe electrolyte disturbances and Inpatient level of care appropriate due to severity of illness Dispo: The patient is from: Home              Anticipated d/c is to: Home              Patient currently is not medically stable to d/c.   Difficult to place patient No   Unresulted Labs (From admission, onward)     Start      Ordered   09/29/20 1149  Blood gas, arterial  Once,   R        09/29/20 1148   09/29/20 [id number]  Basic metabolic panel  Daily,   R      09/28/20 1527   09/29/20 0500  CBC  Daily,   R      09/28/20 1527          Medications reviewed:  Scheduled Meds:  amLODipine  10 mg Oral Daily   aspirin EC  81 mg Oral Daily   carvedilol  12.5 mg Oral BID WC   Chlorhexidine Gluconate Cloth  6 each Topical Daily   heparin  5,000 Units Subcutaneous Q8H   insulin aspart  2-6 Units Subcutaneous Q4H   insulin detemir  12 Units Subcutaneous BID   mouth rinse  15 mL Mouth Rinse BID   minoxidil  10 mg Oral BID   Continuous Infusions: Consultants:see note  Procedures:see note Antimicrobials: Anti-infectives (From admission, onward)    None      Culture/Microbiology    Component Value Date/Time   SDES BLOOD LEFT ANTECUBITAL 08/11/2017 2200   SPECREQUEST  08/11/2017 2200    BOTTLES DRAWN AEROBIC AND ANAEROBIC Blood Culture adequate volume   CULT  08/11/2017 2200    NO GROWTH 5 DAYS Performed at [facility], [place] [address] [place], [place] [address]    REPTSTATUS 08/17/2017 FINAL 08/11/2017 2200    Other culture-see note  Objective: Vitals: Today's Vitals   09/29/20 2301 09/29/20 2349 09/30/20 0413 09/30/20 0820  BP:  137/80 (!) 161/89   Pulse: 86 87 85   Resp: '18 15 20   '$ Temp:  98.4 F (36.9 C) 98.4 F (36.9 C)   TempSrc:  Oral Oral   SpO2: 99% 97% 96%   Weight:   116.6 kg   Height:      PainSc:    7     Intake/Output Summary (Last 24 hours) at 09/30/2020 0921 Last data filed at 09/30/2020 0900 Gross per 24 hour  Intake 720 ml  Output 1650 ml  Net -930 ml   Filed Weights   09/29/20 0500 09/29/20 1832 09/30/20 0413  Weight: 118.9 kg 116.3 kg 116.6 kg   Weight change: -2.643 kg  Intake/Output from previous day: 07/31 0701 - 08/01 0700 In: 480 [P.O.:480] Out: 1800 [Urine:1800] Intake/Output this shift: Total I/O In: 240 [P.O.:240] Out: 500 [Urine:500] Filed  Weights   09/29/20 0500 09/29/20 1832 09/30/20 0413  Weight: 118.9 kg 116.3 kg 116.6 kg   Examination: General exam: AAO x3, obese, NAD HEENT:Oral mucosa moist, Ear/Nose WNL grossly,dentition normal. Respiratory system: bilaterally diminished,no use of accessory muscle, non tender. Cardiovascular system: S1 & S2 +,No JVD. Gastrointestinal system: Abdomen soft, NT,ND, BS+. Nervous System:Alert, awake, moving extremities Extremities: NO edema, distal peripheral pulses palpable.  Skin: No rashes,no icterus. MSK: Normal muscle bulk,tone, power Data Reviewed: I have personally reviewed following labs  and imaging studies CBC: Recent Labs  Lab 09/27/20 2350 09/28/20 0011 09/29/20 0250 09/29/20 1219 09/30/20 0110  WBC 11.4*  --  7.9  --  5.8  NEUTROABS 5.4  --   --   --   --   HGB 16.1 15.6 15.0 14.3 14.5  HCT 48.5 46.0 43.5 42.0 43.7  MCV 81.4  --  79.5*  --  80.6  PLT 337  --  271  --  [id number]   Basic Metabolic Panel: Recent Labs  Lab 09/27/20 2350 09/28/20 0011 09/28/20 0201 09/29/20 0250 09/29/20 1219 09/30/20 0110  NA 137 141 138 139 139 137  K 3.9 3.7 4.0 3.6 3.6 3.6  CL 105  --  104 101  --  103  CO2 23  --  23 26  --  25  GLUCOSE 263*  --  252* 84  --  120*  BUN 23*  --  25* 27*  --  37*  CREATININE 2.44*  --  2.56* 2.92*  --  3.53*  CALCIUM 8.9  --  8.7* 9.0  --  8.7*  MG  --   --  1.6* 2.0  --   --   PHOS  --   --  4.2 3.7  --   --    GFR: Estimated Creatinine Clearance: 36.1 mL/min (A) (by C-G formula based on SCr of 3.53 mg/dL (H)). Liver Function Tests: Recent Labs  Lab 09/27/20 2350 09/28/20 0201  AST 39 27  ALT 26 25  ALKPHOS 99 74  BILITOT 1.0 0.7  PROT 8.3* 7.3  ALBUMIN 3.8 3.3*   No results for input(s): LIPASE, AMYLASE in the last 168 hours. No results for input(s): AMMONIA in the last 168 hours. Coagulation Profile: No results for input(s): INR, PROTIME in the last 168 hours. Cardiac Enzymes: No results for input(s): CKTOTAL, CKMB,  CKMBINDEX, TROPONINI in the last 168 hours. BNP (last 3 results) No results for input(s): PROBNP in the last 8760 hours. HbA1C: Recent Labs    09/29/20 0250  HGBA1C 7.6*   CBG: Recent Labs  Lab 09/29/20 2348 09/30/20 0412 09/30/20 0435 09/30/20 0510 09/30/20 0742  GLUCAP 173* 50* 59* 87 88   Lipid Profile: No results for input(s): CHOL, HDL, LDLCALC, TRIG, CHOLHDL, LDLDIRECT in the last 72 hours. Thyroid Function Tests: No results for input(s): TSH, T4TOTAL, FREET4, T3FREE, THYROIDAB in the last 72 hours. Anemia Panel: No results for input(s): VITAMINB12, FOLATE, FERRITIN, TIBC, IRON, RETICCTPCT in the last 72 hours. Sepsis Labs: No results for input(s): PROCALCITON, LATICACIDVEN in the last 168 hours.  Recent Results (from the past 240 hour(s))  Resp Panel by RT-PCR (Flu A&B, Covid) Nasopharyngeal Swab     Status: None   Collection Time: 09/28/20  4:45 AM   Specimen: Nasopharyngeal Swab; Nasopharyngeal(NP) swabs in vial transport medium  Result Value Ref Range Status   SARS Coronavirus 2 by RT PCR NEGATIVE NEGATIVE Final    Comment: (NOTE) SARS-CoV-2 target nucleic acids are NOT DETECTED.  The SARS-CoV-2 RNA is generally detectable in upper respiratory specimens during the acute phase of infection. The lowest concentration of SARS-CoV-2 viral copies this assay can detect is 138 copies/mL. A negative result does not preclude SARS-Cov-2 infection and should not be used as the sole basis for treatment or other patient management decisions. A negative result may occur with  improper specimen collection/handling, submission of specimen other than nasopharyngeal swab, presence of viral mutation(s) within the areas targeted by this assay, and inadequate number  of viral copies(<138 copies/mL). A negative result must be combined with clinical observations, patient history, and epidemiological information. The expected result is Negative.  Fact Sheet for Patients:   EntrepreneurPulse.com.au  Fact Sheet for Healthcare Providers:  IncredibleEmployment.be  This test is no t yet approved or cleared by the [place] FDA and  has been authorized for detection and/or diagnosis of SARS-CoV-2 by FDA under an Emergency Use Authorization (EUA). This EUA will remain  in effect (meaning this test can be used) for the duration of the COVID-19 declaration under Section 564(b)(1) of the Act, 21 U.S.C.section 360bbb-3(b)(1), unless the authorization is terminated  or revoked sooner.       Influenza A by PCR NEGATIVE NEGATIVE Final   Influenza B by PCR NEGATIVE NEGATIVE Final    Comment: (NOTE) The Xpert Xpress SARS-CoV-2/FLU/RSV plus assay is intended as an aid in the diagnosis of influenza from Nasopharyngeal swab specimens and should not be used as a sole basis for treatment. Nasal washings and aspirates are unacceptable for Xpert Xpress SARS-CoV-2/FLU/RSV testing.  Fact Sheet for Patients: EntrepreneurPulse.com.au  Fact Sheet for Healthcare Providers: IncredibleEmployment.be  This test is not yet approved or cleared by the [place] FDA and has been authorized for detection and/or diagnosis of SARS-CoV-2 by FDA under an Emergency Use Authorization (EUA). This EUA will remain in effect (meaning this test can be used) for the duration of the COVID-19 declaration under Section 564(b)(1) of the Act, 21 U.S.C. section 360bbb-3(b)(1), unless the authorization is terminated or revoked.  Performed at [facility], [place] [address] [place], [place] [address]   MRSA Next Gen by PCR, Nasal     Status: None   Collection Time: 09/28/20  5:52 AM   Specimen: Nasal Mucosa; Nasal Swab  Result Value Ref Range Status   MRSA by PCR Next Gen NOT DETECTED NOT DETECTED Final    Comment: (NOTE) The GeneXpert MRSA Assay (FDA approved for NASAL specimens only), is one component of a  comprehensive MRSA colonization surveillance program. It is not intended to diagnose MRSA infection nor to guide or monitor treatment for MRSA infections. Test performance is not FDA approved in patients less than 41 years old. Performed at [facility], [place] [address] [place], [place] [address]      Radiology Studies: No results found.   LOS: 2 days   Antonieta Pert, MD [facility]  09/30/2020, 9:21 AM

## 2020-09-30 NOTE — Progress Notes (Signed)
Pt 0400 scheduled CBG 50, NT gave patient juice and notified RN.  Recheck 59, patient on BPAP and sleepy.  Administered D50 per adult hypoglycemia order set.  Will follow up in 15 minutes with repeat CBG.

## 2020-09-30 NOTE — Progress Notes (Signed)
Pt CBG 87 after D50 administered per hypoglycemic orders.  Will continue to monitor closely.

## 2020-10-01 DIAGNOSIS — I161 Hypertensive emergency: Secondary | ICD-10-CM | POA: Diagnosis not present

## 2020-10-01 DIAGNOSIS — I10 Essential (primary) hypertension: Secondary | ICD-10-CM | POA: Diagnosis not present

## 2020-10-01 LAB — GLUCOSE, CAPILLARY
Glucose-Capillary: 157 mg/dL — ABNORMAL HIGH (ref 70–99)
Glucose-Capillary: 208 mg/dL — ABNORMAL HIGH (ref 70–99)
Glucose-Capillary: 195 mg/dL — ABNORMAL HIGH (ref 70–99)
Glucose-Capillary: 171 mg/dL — ABNORMAL HIGH (ref 70–99)

## 2020-10-01 LAB — CBC
HCT: 42.2 % (ref 39.0–52.0)
Hemoglobin: 14.4 g/dL (ref 13.0–17.0)
MCH: 27 pg (ref 26.0–34.0)
MCHC: 34.1 g/dL (ref 30.0–36.0)
MCV: 79.2 fL — ABNORMAL LOW (ref 80.0–100.0)
Platelets: 270 10*3/uL (ref 150–400)
RBC: 5.33 MIL/uL (ref 4.22–5.81)
RDW: 13.3 % (ref 11.5–15.5)
WBC: 5.5 10*3/uL (ref 4.0–10.5)
nRBC: 0 % (ref 0.0–0.2)

## 2020-10-01 LAB — URINALYSIS, ROUTINE W REFLEX MICROSCOPIC
Bacteria, UA: NONE SEEN
Bilirubin Urine: NEGATIVE
Glucose, UA: NEGATIVE mg/dL
Hgb urine dipstick: NEGATIVE
Ketones, ur: NEGATIVE mg/dL
Leukocytes,Ua: NEGATIVE
Nitrite: NEGATIVE
Protein, ur: 100 mg/dL — AB
Specific Gravity, Urine: 1.014 (ref 1.005–1.030)
pH: 5 (ref 5.0–8.0)

## 2020-10-01 LAB — BASIC METABOLIC PANEL
Anion gap: 11 (ref 5–15)
BUN: 41 mg/dL — ABNORMAL HIGH (ref 6–20)
CO2: 24 mmol/L (ref 22–32)
Calcium: 9 mg/dL (ref 8.9–10.3)
Chloride: 101 mmol/L (ref 98–111)
Creatinine, Ser: 3.73 mg/dL — ABNORMAL HIGH (ref 0.61–1.24)
GFR, Estimated: 20 mL/min — ABNORMAL LOW (ref >60)
Glucose, Bld: 172 mg/dL — ABNORMAL HIGH (ref 70–99)
Potassium: 3.2 mmol/L — ABNORMAL LOW (ref 3.5–5.1)
Sodium: 136 mmol/L (ref 135–145)

## 2020-10-01 LAB — PROTEIN / CREATININE RATIO, URINE
Creatinine, Urine: 244.35 mg/dL
Protein Creatinine Ratio: 0.55 mg/mg{Cre} — ABNORMAL HIGH (ref 0.00–0.15)
Total Protein, Urine: 135 mg/dL

## 2020-10-01 MED ORDER — POTASSIUM CHLORIDE CRYS ER 20 MEQ PO TBCR
40.0000 meq | EXTENDED_RELEASE_TABLET | Freq: Once | ORAL | Status: AC
  Administered 2020-10-01: 40 meq via ORAL
  Filled 2020-10-01: qty 2

## 2020-10-01 NOTE — Progress Notes (Addendum)
PROGRESS NOTE    Harold Clements  [id number] DOB: 1980-06-03 DOA: 09/27/2020 PCP: Kerin Perna, NP   Chief Complaint  Patient presents with   Respiratory Distress   Brief Narrative: 40 year old male with history of uncontrolled hypertension combined systolic/diastolic CHF, [id number] on insulin, CKD stage III, ischemic stroke, history of noncompliance, admitted with sudden onset of shortness of breath and uncontrolled hypertension in 200 needing BiPAP nitro drip and was admitted to ICU.  Patient with diarrhea weaned off nitro drip and BiPAP.  He has been stabilized and transferred to [facility] service 09/30/20  Subjective: Seen this morning.  Wife at the bedside.  Resting comfortably denies chest pain nausea vomiting. BP stable, sugar stable Voiding.  Assessment & Plan:  Acute hypoxic respiratory failure due to hypertensive emergency/pulmonary edema: Weaned off BiPAP.  And onw off o2.  Hypertensive emergency/Acute Pulmonary edema He is off nitro drip.Off Lasix.blood pressure well controlled-continue home amlodipine 10, carvedilol 12.5 mg twice daily, minoxidil and hydralazine.  valsartan and Aldactone held due to AKI.  Monitor intake output and weight daily as below Net IO Since Admission: -3,707.86 mL [10/01/20 0907]  Filed Weights   09/29/20 1832 09/30/20 0413 10/01/20 0500  Weight: 116.3 kg 116.6 kg 116.5 kg   Intake/Output Summary (Last 24 hours) at 10/01/2020 [id number] Last data filed at 10/01/2020 0359 Gross per 24 hour  Intake 960 ml  Output 990 ml  Net -30 ml   Diabetes mellitus on Bene-term insulin with hyporglycemia: Hemoglobin A1c borderline controlled 7.6. On 26 units  Mix insulin twice daily insulin at home- was placed on 12 units Lantus twice daily but due to hypoglycemia were discontinued.  Keep on sliding scale insulin for now.  Recent Labs  Lab 09/30/20 0742 09/30/20 1155 09/30/20 1559 09/30/20 2122 10/01/20 0747  GLUCAP 88 286* 89 183* 157*   Chronic combined  systolic/diastolic CHF: With pulmonary edema due to hypertensive emergency. Continue to adjust his antihypertensive regimen.  Diuretics as tolerated holding due to AKI currently.  AKI on CKD stage IIIa: Baseline creatinine around 1.9 in 11/2019.  Followed by [facility].  BUN creatinine uptrending as below-likely CKD has progressed since his last blood work last year.  Discussed with nephrology ordering UA, monitoring creatinine if continues to worsen nephrology will see him in the morning.   Recent Labs    11/28/19 1114 09/27/20 2350 09/28/20 0201 09/29/20 0250 09/30/20 0110 10/01/20 0317  BUN 21* 23* 25* 27* 37* 41*  CREATININE 1.98* 2.44* 2.56* 2.92* 3.53* 3.73*    Mild hypokalemia we will replete orally Morbid obesity BMI 37: Will benefit weight loss, PCP follow-up OSA-continue CPAP at bedtime.  Noncompliance -he is aware that he will need to be more compliant with medication and his wife was reinforcing. Diet Order             Diet renal with fluid restriction Fluid restriction: 1200 mL Fluid; Room service appropriate? Yes; Fluid consistency: Thin  Diet effective now                    Patient's Body mass index is 37.92 kg/m.   DVT prophylaxis: heparin injection 5,000 Units Start: 09/28/20 0600 SCDs Start: 09/28/20 0140 Code Status:   Code Status: Full Code  Family Communication: plan of care discussed with patient and his wife at bedside. Status is: Inpatient Remains inpatient appropriate because:Persistent severe electrolyte disturbances and Inpatient level of care appropriate due to severity of illness Dispo: The patient is from: Home  Anticipated d/c is to: Home once renal function stabilizes              Patient currently is not medically stable to d/c.   Difficult to place patient No   Unresulted Labs (From admission, onward)     Start     Ordered   09/29/20 [id number]  Basic metabolic panel  Daily,   R      09/28/20 1527   09/29/20 0500  CBC   Daily,   R      09/28/20 1527          Medications reviewed:  Scheduled Meds:  amLODipine  10 mg Oral Daily   aspirin EC  81 mg Oral Daily   carvedilol  12.5 mg Oral BID WC   Chlorhexidine Gluconate Cloth  6 each Topical Daily   heparin  5,000 Units Subcutaneous Q8H   hydrALAZINE  100 mg Oral TID   insulin aspart  0-15 Units Subcutaneous TID WC   mouth rinse  15 mL Mouth Rinse BID   minoxidil  10 mg Oral BID   Continuous Infusions: Consultants:see note  Procedures:see note Antimicrobials: Anti-infectives (From admission, onward)    None      Culture/Microbiology    Component Value Date/Time   SDES BLOOD LEFT ANTECUBITAL 08/11/2017 2200   SPECREQUEST  08/11/2017 2200    BOTTLES DRAWN AEROBIC AND ANAEROBIC Blood Culture adequate volume   CULT  08/11/2017 2200    NO GROWTH 5 DAYS Performed at [facility], [place] [address] [place], [place] [address]    REPTSTATUS 08/17/2017 FINAL 08/11/2017 2200    Other culture-see note  Objective: Vitals: Today's Vitals   10/01/20 0035 10/01/20 0358 10/01/20 0500 10/01/20 0828  BP: (!) 159/68 (!) 150/90  127/60  Pulse: 89 91  87  Resp: '18 18  18  '$ Temp: 98.1 F (36.7 C) 97.9 F (36.6 C)  98.7 F (37.1 C)  TempSrc: Axillary Oral  Oral  SpO2: 99% 94%  99%  Weight:   116.5 kg   Height:      PainSc:    0-No pain    Intake/Output Summary (Last 24 hours) at 10/01/2020 0907 Last data filed at 10/01/2020 0359 Gross per 24 hour  Intake 960 ml  Output 990 ml  Net -30 ml    Filed Weights   09/29/20 1832 09/30/20 0413 10/01/20 0500  Weight: 116.3 kg 116.6 kg 116.5 kg   Weight change: 0.227 kg  Intake/Output from previous day: 08/01 0701 - 08/02 0700 In: 1200 [P.O.:1200] Out: 1490 [Urine:1490] Intake/Output this shift: No intake/output data recorded. Filed Weights   09/29/20 1832 09/30/20 0413 10/01/20 0500  Weight: 116.3 kg 116.6 kg 116.5 kg   Examination: General exam: AAOx 3, morbidly obese older than stated  age, weak appearing. HEENT:Oral mucosa moist, Ear/Nose WNL grossly, dentition normal. Respiratory system: bilaterally diminished, no added sounds, no use of accessory muscle Cardiovascular system: S1 & S2 +, No JVD,. Gastrointestinal system: Abdomen soft, NT,ND, BS+ Nervous System:Alert, awake, moving extremities and grossly nonfocal Extremities: no edema, distal peripheral pulses palpable.  Skin: No rashes,no icterus. MSK: Normal muscle bulk,tone, power  Data Reviewed: I have personally reviewed following labs and imaging studies CBC: Recent Labs  Lab 09/27/20 2350 09/28/20 0011 09/29/20 0250 09/29/20 1219 09/30/20 0110 10/01/20 0317  WBC 11.4*  --  7.9  --  5.8 5.5  NEUTROABS 5.4  --   --   --   --   --  HGB 16.1 15.6 15.0 14.3 14.5 14.4  HCT 48.5 46.0 43.5 42.0 43.7 42.2  MCV 81.4  --  79.5*  --  80.6 79.2*  PLT 337  --  271  --  248 [id number]    Basic Metabolic Panel: Recent Labs  Lab 09/27/20 2350 09/28/20 0011 09/28/20 0201 09/29/20 0250 09/29/20 1219 09/30/20 0110 10/01/20 0317  NA 137   < > 138 139 139 137 136  K 3.9   < > 4.0 3.6 3.6 3.6 3.2*  CL 105  --  104 101  --  103 101  CO2 23  --  23 26  --  25 24  GLUCOSE 263*  --  252* 84  --  120* 172*  BUN 23*  --  25* 27*  --  37* 41*  CREATININE 2.44*  --  2.56* 2.92*  --  3.53* 3.73*  CALCIUM 8.9  --  8.7* 9.0  --  8.7* 9.0  MG  --   --  1.6* 2.0  --   --   --   PHOS  --   --  4.2 3.7  --   --   --    < > = values in this interval not displayed.    GFR: Estimated Creatinine Clearance: 34.1 mL/min (A) (by C-G formula based on SCr of 3.73 mg/dL (H)). Liver Function Tests: Recent Labs  Lab 09/27/20 2350 09/28/20 0201  AST 39 27  ALT 26 25  ALKPHOS 99 74  BILITOT 1.0 0.7  PROT 8.3* 7.3  ALBUMIN 3.8 3.3*    No results for input(s): LIPASE, AMYLASE in the last 168 hours. No results for input(s): AMMONIA in the last 168 hours. Coagulation Profile: No results for input(s): INR, PROTIME in the last 168  hours. Cardiac Enzymes: No results for input(s): CKTOTAL, CKMB, CKMBINDEX, TROPONINI in the last 168 hours. BNP (last 3 results) No results for input(s): PROBNP in the last 8760 hours. HbA1C: Recent Labs    09/29/20 0250  HGBA1C 7.6*    CBG: Recent Labs  Lab 09/30/20 0742 09/30/20 1155 09/30/20 1559 09/30/20 2122 10/01/20 0747  GLUCAP 88 286* 89 183* 157*    Lipid Profile: No results for input(s): CHOL, HDL, LDLCALC, TRIG, CHOLHDL, LDLDIRECT in the last 72 hours. Thyroid Function Tests: No results for input(s): TSH, T4TOTAL, FREET4, T3FREE, THYROIDAB in the last 72 hours. Anemia Panel: No results for input(s): VITAMINB12, FOLATE, FERRITIN, TIBC, IRON, RETICCTPCT in the last 72 hours. Sepsis Labs: No results for input(s): PROCALCITON, LATICACIDVEN in the last 168 hours.  Recent Results (from the past 240 hour(s))  Resp Panel by RT-PCR (Flu A&B, Covid) Nasopharyngeal Swab     Status: None   Collection Time: 09/28/20  4:45 AM   Specimen: Nasopharyngeal Swab; Nasopharyngeal(NP) swabs in vial transport medium  Result Value Ref Range Status   SARS Coronavirus 2 by RT PCR NEGATIVE NEGATIVE Final    Comment: (NOTE) SARS-CoV-2 target nucleic acids are NOT DETECTED.  The SARS-CoV-2 RNA is generally detectable in upper respiratory specimens during the acute phase of infection. The lowest concentration of SARS-CoV-2 viral copies this assay can detect is 138 copies/mL. A negative result does not preclude SARS-Cov-2 infection and should not be used as the sole basis for treatment or other patient management decisions. A negative result may occur with  improper specimen collection/handling, submission of specimen other than nasopharyngeal swab, presence of viral mutation(s) within the areas targeted by this assay, and inadequate number of viral copies(<138  copies/mL). A negative result must be combined with clinical observations, patient history, and epidemiological information.  The expected result is Negative.  Fact Sheet for Patients:  EntrepreneurPulse.com.au  Fact Sheet for Healthcare Providers:  IncredibleEmployment.be  This test is no t yet approved or cleared by the [place] FDA and  has been authorized for detection and/or diagnosis of SARS-CoV-2 by FDA under an Emergency Use Authorization (EUA). This EUA will remain  in effect (meaning this test can be used) for the duration of the COVID-19 declaration under Section 564(b)(1) of the Act, 21 U.S.C.section 360bbb-3(b)(1), unless the authorization is terminated  or revoked sooner.       Influenza A by PCR NEGATIVE NEGATIVE Final   Influenza B by PCR NEGATIVE NEGATIVE Final    Comment: (NOTE) The Xpert Xpress SARS-CoV-2/FLU/RSV plus assay is intended as an aid in the diagnosis of influenza from Nasopharyngeal swab specimens and should not be used as a sole basis for treatment. Nasal washings and aspirates are unacceptable for Xpert Xpress SARS-CoV-2/FLU/RSV testing.  Fact Sheet for Patients: EntrepreneurPulse.com.au  Fact Sheet for Healthcare Providers: IncredibleEmployment.be  This test is not yet approved or cleared by the [place] FDA and has been authorized for detection and/or diagnosis of SARS-CoV-2 by FDA under an Emergency Use Authorization (EUA). This EUA will remain in effect (meaning this test can be used) for the duration of the COVID-19 declaration under Section 564(b)(1) of the Act, 21 U.S.C. section 360bbb-3(b)(1), unless the authorization is terminated or revoked.  Performed at [facility], [place] [address], [place], [place] [address]   MRSA Next Gen by PCR, Nasal     Status: None   Collection Time: 09/28/20  5:52 AM   Specimen: Nasal Mucosa; Nasal Swab  Result Value Ref Range Status   MRSA by PCR Next Gen NOT DETECTED NOT DETECTED Final    Comment: (NOTE) The GeneXpert MRSA Assay (FDA  approved for NASAL specimens only), is one component of a comprehensive MRSA colonization surveillance program. It is not intended to diagnose MRSA infection nor to guide or monitor treatment for MRSA infections. Test performance is not FDA approved in patients less than 40 years old. Performed at [facility], [place] [address] [place], [place] [address]       Radiology Studies: No results found.   LOS: 3 days   Antonieta Pert, MD [facility]  10/01/2020, 9:07 AM

## 2020-10-02 ENCOUNTER — Ambulatory Visit: Payer: Medicaid Other | Admitting: Cardiovascular Disease

## 2020-10-02 DIAGNOSIS — I5042 Chronic combined systolic (congestive) and diastolic (congestive) heart failure: Secondary | ICD-10-CM | POA: Diagnosis not present

## 2020-10-02 DIAGNOSIS — I10 Essential (primary) hypertension: Secondary | ICD-10-CM | POA: Diagnosis not present

## 2020-10-02 DIAGNOSIS — I161 Hypertensive emergency: Secondary | ICD-10-CM | POA: Diagnosis not present

## 2020-10-02 LAB — BLOOD GAS, VENOUS
Acid-Base Excess: 1.4 mmol/L (ref 0.0–2.0)
Bicarbonate: 25.3 mmol/L (ref 20.0–28.0)
FIO2: 21
O2 Saturation: 91.8 %
Patient temperature: 37
pCO2, Ven: 38.3 mmHg — ABNORMAL LOW (ref 44.0–60.0)
pH, Ven: 7.434 — ABNORMAL HIGH (ref 7.250–7.430)
pO2, Ven: 61 mmHg — ABNORMAL HIGH (ref 32.0–45.0)

## 2020-10-02 LAB — GLUCOSE, CAPILLARY
Glucose-Capillary: 167 mg/dL — ABNORMAL HIGH (ref 70–99)
Glucose-Capillary: 168 mg/dL — ABNORMAL HIGH (ref 70–99)
Glucose-Capillary: 175 mg/dL — ABNORMAL HIGH (ref 70–99)
Glucose-Capillary: 212 mg/dL — ABNORMAL HIGH (ref 70–99)

## 2020-10-02 LAB — BASIC METABOLIC PANEL
Anion gap: 12 (ref 5–15)
BUN: 39 mg/dL — ABNORMAL HIGH (ref 6–20)
CO2: 24 mmol/L (ref 22–32)
Calcium: 9.1 mg/dL (ref 8.9–10.3)
Chloride: 102 mmol/L (ref 98–111)
Creatinine, Ser: 3.32 mg/dL — ABNORMAL HIGH (ref 0.61–1.24)
GFR, Estimated: 24 mL/min — ABNORMAL LOW (ref >60)
Glucose, Bld: 137 mg/dL — ABNORMAL HIGH (ref 70–99)
Potassium: 3.4 mmol/L — ABNORMAL LOW (ref 3.5–5.1)
Sodium: 138 mmol/L (ref 135–145)

## 2020-10-02 LAB — CBC
HCT: 41 % (ref 39.0–52.0)
Hemoglobin: 13.9 g/dL (ref 13.0–17.0)
MCH: 26.8 pg (ref 26.0–34.0)
MCHC: 33.9 g/dL (ref 30.0–36.0)
MCV: 79 fL — ABNORMAL LOW (ref 80.0–100.0)
Platelets: 294 10*3/uL (ref 150–400)
RBC: 5.19 MIL/uL (ref 4.22–5.81)
RDW: 13.4 % (ref 11.5–15.5)
WBC: 5.5 10*3/uL (ref 4.0–10.5)
nRBC: 0 % (ref 0.0–0.2)

## 2020-10-02 LAB — TROPONIN I (HIGH SENSITIVITY): Troponin I (High Sensitivity): 77 ng/L — ABNORMAL HIGH (ref <18)

## 2020-10-02 MED ORDER — CARVEDILOL 25 MG PO TABS
25.0000 mg | ORAL_TABLET | Freq: Two times a day (BID) | ORAL | Status: DC
  Administered 2020-10-02: 25 mg via ORAL
  Filled 2020-10-02: qty 1

## 2020-10-02 MED ORDER — INSULIN ASPART PROT & ASPART (70-30 MIX) 100 UNIT/ML ~~LOC~~ SUSP
5.0000 [IU] | Freq: Two times a day (BID) | SUBCUTANEOUS | Status: DC
  Administered 2020-10-02 – 2020-10-03 (×2): 5 [IU] via SUBCUTANEOUS
  Filled 2020-10-02 (×2): qty 10

## 2020-10-02 MED ORDER — POTASSIUM CHLORIDE CRYS ER 20 MEQ PO TBCR
20.0000 meq | EXTENDED_RELEASE_TABLET | Freq: Once | ORAL | Status: AC
  Administered 2020-10-02: 20 meq via ORAL
  Filled 2020-10-02: qty 1

## 2020-10-02 NOTE — Consult Note (Addendum)
Cardiology Consultation:   Patient ID: Harold Clements MRN: [id number]; DOB: May 19, 1980  Admit date: 09/27/2020 Date of Consult: 10/02/2020  PCP:  Kerin Perna, NP   [facility] Providers Cardiologist:  Sanda Klein, MD   {  Patient Profile:   Harold Clements is a 40 y.o. male with a history of chronic CHF with mildly reduced EF of 40-45%, uncontrolled HTN, type 2 diabetes mellitus, CKD stage IV, CVA in the setting of hypertensive emergency, sleep apnea on CPAP, and history of medication noncompliance who is being seen for the evaluation of hypertensive urgency at the request of Dr. Lupita Leash.  History of Present Illness:   Harold Clements is a 40 year old male with the above history who is followed by Dr. Sallyanne Kuster and Dr. Oval Linsey for his hypertension. He was in his usual state of health until 09/28/20 when he developed acute onset respiratory distress. He works at [employer] and was collecting carts outside when he developed sudden onset of shortness of breath. 911 was called and he was found to have O2 sat of 40% upon EMS arrival. He was placed on CPAP with improvement in sats to the 70s. BP was markedly elevated to 200s/100s and he was started on Nitro drip in addition to BiPAP. He has a longstanding history of medication non-compliance and reported missing his medications on the day of presentation. Chest x-ray showed asymmetric bibasilar pulmonary infiltrates with dense consolidation at the right lung base (atypical infection vs aspiration). He had no complaints of chest pain. High-sensitivity troponin  elevated 57 >> 176 >> 77. EKG showed sinus tachycardia with rate 138 bpm, LVH, and non-specific ST-T wave abnormalities. He was admitted to [facility] for hypertensive emergency and acute pulmonary edema. His home medications were restarted and he was started on IV Lasix. Cardiology was consulted for assistance.   At the time of this evaluation, patient resting comfortably in no acute distress.  O2  sats 98% on room air.  He is somnolent but awake to answer questions.  He states he was in his usual state of health until 09/28/2020 he developed sudden onset of acute shortness of breath while mopping the floor.  Prior to this he denies any shortness of breath.  No orthopnea/PND.  No lower extremity edema.  No chest pain, palpitations, lightheadedness, dizziness, syncope.  No recent fevers or illnesses.  No abnormal bleeding in urine or stools.  He states he had not been taking his antihypertensives for a few days prior to this event due to cost (he was waiting to get paid).  He does note compliance with his CPAP machine.  Called and personally spoke with patient's outpatient Pharmacy to confirm when antihypertensives had last been filled. - Amlodipine had not been filled since 10/26/2019. - Coreg 12.'5mg'$  twice daily last filled 09/02/2020 (90 days). - Hydralazine '100mg'$  three times daily last filled 09/02/2020 (90 days). - Spironolactone '25mg'$  daily last filled 09/02/2020 (90 days). - Valsartan '40mg'$  daily last filled 09/02/2020 (30 days). - Minoxidil '10mg'$  twice daily last filled 09/25/2020 (30 days).  Past Medical History:  Diagnosis Date   CKD (chronic kidney disease) stage 2, GFR 60-89 ml/min    Diabetes mellitus without complication ([place])    Ear pain 07/2017   Hypertension    Stroke ([place])    Wasp sting 08/04/2016   of dorsum of left hand near thumb    Past Surgical History:  Procedure Laterality Date   BUBBLE STUDY  02/02/2019   Procedure: BUBBLE STUDY;  Surgeon: Lelon Perla, MD;  Location: [facility] ENDOSCOPY;  Service: Cardiovascular;;   TEE WITHOUT CARDIOVERSION N/A 02/02/2019   Procedure: TRANSESOPHAGEAL ECHOCARDIOGRAM (TEE);  Surgeon: Lelon Perla, MD;  Location: [facility] ENDOSCOPY;  Service: Cardiovascular;  Laterality: N/A;     Home Medications:  Prior to Admission medications   Medication Sig Start Date End Date Taking? Authorizing Provider  amLODipine (NORVASC) 10 MG tablet Take 10 mg by  mouth daily.   Yes [provider]  aspirin EC 81 MG tablet Take 81 mg by mouth daily. Swallow whole.   Yes [provider]  carvedilol (COREG) 25 MG tablet Take 0.5 tablets (12.5 mg total) by mouth 2 (two) times daily with a meal. 09/04/20  Yes Croitoru, Mihai, MD  hydrALAZINE (APRESOLINE) 100 MG tablet Take 100 mg by mouth 3 (three) times daily.   Yes [provider]  minoxidil (LONITEN) 10 MG tablet Take 1 tablet (10 mg total) by mouth 2 (two) times daily. 09/04/20  Yes Croitoru, Mihai, MD  spironolactone (ALDACTONE) 25 MG tablet Take 25 mg by mouth daily.   Yes [provider]  valsartan (DIOVAN) 40 MG tablet Take 40 mg by mouth daily.   Yes [provider]  Vitamin D, Ergocalciferol, (DRISDOL) 1.25 MG (50000 UNIT) CAPS capsule Take 50,000 Units by mouth every 7 (seven) days.   Yes [provider]  Insulin Isophane & Regular Human (NOVOLIN 70/30 FLEXPEN) (70-30) 100 UNIT/ML PEN Inject 26 Units into the skin 2 (two) times daily. Patient not taking: Reported on 09/28/2020 01/13/19   Modena Nunnery D, DO    Inpatient Medications: Scheduled Meds:  amLODipine  10 mg Oral Daily   aspirin EC  81 mg Oral Daily   carvedilol  12.5 mg Oral BID WC   heparin  5,000 Units Subcutaneous Q8H   hydrALAZINE  100 mg Oral TID   insulin aspart  0-15 Units Subcutaneous TID WC   insulin aspart protamine- aspart  5 Units Subcutaneous BID WC   mouth rinse  15 mL Mouth Rinse BID   minoxidil  10 mg Oral BID   Continuous Infusions:  PRN Meds: docusate sodium, pneumococcal 23 valent vaccine, polyethylene glycol  Allergies:    Allergies  Allergen Reactions   Zithromax [Azithromycin Dihydrate] Anaphylaxis   Kiwi Extract Other (See Comments)    Tongue goes numb    Social History:   Social History   Socioeconomic History   Marital status: Married    Spouse name: Not on file   Number of children: Not on file   Years of education: Not on file    Highest education level: Not on file  Occupational History   Occupation: Engineer, agricultural  Tobacco Use   Smoking status: Former    Types: Cigars    Quit date: 2010    Years since quitting: 12.5   Smokeless tobacco: Never  Vaping Use   Vaping Use: Never used  Substance and Sexual Activity   Alcohol use: No   Drug use: No   Sexual activity: Yes  Other Topics Concern   Not on file  Social History Narrative   Not on file   Social Determinants of Health   Financial Resource Strain: Not on file  Food Insecurity: Not on file  Transportation Needs: Not on file  Physical Activity: Not on file  Stress: Not on file  Social Connections: Not on file  Intimate Partner Violence: Not on file    Family History:   Family History  Problem Relation Age of Onset   Hypertension Mother    Diabetes Mother    Heart disease Mother    Heart attack Mother        young. unsure age   Hypertension Father    Heart disease Brother      ROS:  Please see the history of present illness.  Review of Systems  Constitutional:  Negative for chills and fever.  HENT:  Negative for congestion.   Eyes:  Negative for blurred vision.  Respiratory:  Positive for shortness of breath. Negative for cough.   Cardiovascular:  Negative for chest pain, palpitations, orthopnea, leg swelling and PND.  Gastrointestinal:  Negative for blood in stool and melena.  Genitourinary:  Negative for hematuria.  Musculoskeletal:  Negative for myalgias.  Neurological:  Negative for dizziness and loss of consciousness.  Endo/Heme/Allergies:  Does not bruise/bleed easily.  Psychiatric/Behavioral:  Negative for depression.     Physical Exam/Data:   Vitals:   10/02/20 0423 10/02/20 0918 10/02/20 1339 10/02/20 1340  BP: (!) 150/86 131/86  138/78  Pulse: 93   92  Resp: '20 18 20   '$ Temp: 98.6 F (37 C) 98.7 F (37.1 C) 98.3 F (36.8 C)   TempSrc: Oral Oral Oral   SpO2: 98% 100%  98%  Weight: 117.7 kg     Height:         Intake/Output Summary (Last 24 hours) at 10/02/2020 1533 Last data filed at 10/02/2020 1321 Gross per 24 hour  Intake 580 ml  Output 625 ml  Net -45 ml   Last 3 Weights 10/02/2020 10/01/2020 09/30/2020  Weight (lbs) 259 lb 6.4 oz 256 lb 12.8 oz 257 lb 1.6 oz  Weight (kg) 117.663 kg 116.484 kg 116.62 kg     Body mass index is 38.31 kg/m.  General: 40 y.o. obese African American male resting comfortably in no acute distress. HEENT: Normocephalic and atraumatic. Sclera clear.  Neck: Supple. No JVD. Heart: RRR. Distinct S1 and S2. No murmurs, gallops, or rubs. Radial and distal pedal pulses 2+ and equal bilaterally. Lungs: No increased work of breathing. Clear to ausculation bilaterally. No wheezes, rhonchi, or rales.  Abdomen: Soft, non-distended, and non-tender to palpation.  MSK: Normal strength and tone for age. Extremities: No lower extremity edema.    Skin: Warm and dry. Neuro: Alert and oriented x3. No focal deficits. Psych: Somewhat somnolent. Responds appropriately.  EKG:  The EKG was personally reviewed and demonstrates: Sinus tachycardia, rate 138 bpm, with underlying artifact/baseline wandering and non-specific ST/T changes.  Telemetry:  Telemetry was personally reviewed and demonstrates:  Normal sinus rhythm with rates in the 90s.  Relevant CV Studies:  Echocardiogram 09/28/2020: Impressions:  1. Left ventricular ejection fraction, by estimation, is 40 to 45%. Left  ventricular ejection fraction by 3D volume is 41 %. The left ventricle has  mildly decreased function. The left ventricle demonstrates global  hypokinesis. There is severe eccentric  left ventricular hypertrophy of the posterior-lateral segment and  otherwise moderate LVH noted. Indeterminate diastolic filling due to E-A  fusion. The average left ventricular global longitudinal strain is -7.2 %.  The global longitudinal strain is  abnormal, no definite evidence of preserved apical strain though apex has   slightly better values than remainder of myocardium.   2. Right ventricular systolic function is normal. The right ventricular  size is normal. Tricuspid regurgitation signal is inadequate for assessing  PA pressure.   3. A small pericardial effusion is present. The pericardial effusion is  localized near the right atrium and posterior to the left ventricle. There  is no evidence of cardiac tamponade.   4. The mitral valve is normal in structure. Trivial mitral valve  regurgitation. No evidence of mitral stenosis.   5. The aortic valve is grossly normal. Aortic valve regurgitation is  trivial. No aortic stenosis is present.   6. The inferior vena cava is dilated in size with >50% respiratory  variability, suggesting right atrial pressure of 8 mmHg.   Comparison(s): A prior study was performed on 01/11/2019 and 02/02/2019.  Prior images reviewed side by side. LVEF has decreased slightly on side by  side comparison.    Laboratory Data:  High Sensitivity Troponin:   Recent Labs  Lab 09/27/20 2350 09/28/20 0201 10/02/20 0831  TROPONINIHS 57* 176* 77*     Chemistry Recent Labs  Lab 09/30/20 0110 10/01/20 0317 10/02/20 0126  NA 137 136 138  K 3.6 3.2* 3.4*  CL 103 101 102  CO2 '25 24 24  '$ GLUCOSE 120* 172* 137*  BUN 37* 41* 39*  CREATININE 3.53* 3.73* 3.32*  CALCIUM 8.7* 9.0 9.1  GFRNONAA 22* 20* 24*  ANIONGAP '9 11 12    '$ Recent Labs  Lab 09/27/20 2350 09/28/20 0201  PROT 8.3* 7.3  ALBUMIN 3.8 3.3*  AST 39 27  ALT 26 25  ALKPHOS 99 74  BILITOT 1.0 0.7   Hematology Recent Labs  Lab 09/30/20 0110 10/01/20 0317 10/02/20 0126  WBC 5.8 5.5 5.5  RBC 5.42 5.33 5.19  HGB 14.5 14.4 13.9  HCT 43.7 42.2 41.0  MCV 80.6 79.2* 79.0*  MCH 26.8 27.0 26.8  MCHC 33.2 34.1 33.9  RDW 13.4 13.3 13.4  PLT 248 270 294   BNP Recent Labs  Lab 09/27/20 2350  BNP 457.4*    DDimer No results for input(s): DDIMER in the last 168 hours.   Radiology/Studies:  No results  found.   Assessment and Plan:   Hypertensive Emergency - Patient presented with acute respiratory distress due to acute pulmonary edema in setting of hypertensive emergency. BP initially in the 200s/100s. Patient reports not taking his medications for a few days prior to this event due to cost (waiting to be paid). - Home medications include: Amlodipine '10mg'$  daily, Coreg 12.'5mg'$  twice daily, Hydralazine '100mg'$  three times daily, Spironolactone '25mg'$  daily, Valsartan '40mg'$  daily, and Minoxidil '10mg'$  twice daily. Called and spoke with Pharmacy - last time Amlodipine was filled was in 10/2019. Otherwise, he had updated scripts for everything. - Restarted on Amlodipine '10mg'$  daily.  - Will increase Coreg to '25mg'$  twice daily. - Continue home Hydralazine '100mg'$  three times daily. - Continue home Minoxidil '10mg'$  twice daily. - Valsartan and Spironolactone currently on hold due to renal function.   Acute Pulmonary Edema Acute on Chronic CHF with Mildly Reduced EF - BNP elevated at 457. - Chest x-ray showed asymmetric bibasilar pulmonary infiltrates with dense consolidation at the right lung base consistent with atypical infection or aspiration.   - Echo showed LVEF of 40-45% with global hypokinesis and severe LVH. RV normal. Small pericardial effusion also noted with no evidence of tamponade. - Initially received a few doses of IV Lasix but this was stopped due to worsening renal function. Last dose 7/30. Net negative 3.6 L this admission. Creatinine 3.3 today. - Appears euvolemic on exam.  - Continue Coreg 12.'5mg'$  twice daily. - Continue Hydralazine '100mg'$  three times daily. - Home Valsartan and Spironolactone held due to renal function. - Continue to monitor daily  weights, strict I/Os, and renal function. - Cardiomyopathy likely due to uncontrolled hypertension but patient has never had an ischemic evaluation. Discussed with MD - will plan for [facility] tomorrow given multiple CV risk  factors.  Demand Ischemia - High-sensitivity troponin mildly elevated 57 >> 176 >> 77. - EKG shows no acute ischemic changes.  - No chest pain.  - Not consistent with ACS. Consistent with demand ischemia in setting of hypertensive emergency. However, he has multiple CV risk factors and has never had an ischemic evaluation. Discussed with MD and will plan for [facility] tomorrow.   Type 2 Diabetes Mellitus - Hemoglobin A1c 7.6 this admission. - Management per primary team.   Obstructive Sleep Apnea - Patient reports compliance with CPAP. Continue this.   Acute on CKD Stage IV:  - Creatine 2.44 on admission. Peaked at 3.73 on 10/01/2020 after diuresis and 3.32 today. Baseline around 1.9 to 2.2. - Urinalysis showed 100 protein; otherwise, unremarkable. - Renal ultrasound in 2018 showed no evidence of renal artery stenosis. - Continue to monitor closely.  Hypokalemia - Mild hypokalemia with potassium of 3.2 yesterday and 3.4 today. Repleted by primary team. - Continue to monitor closely.    Risk Assessment/Risk Scores:   New York Heart Association (NYHA) Functional Class NYHA Class IV. Presented in acute pulmonary edema in setting of hypertensive emergency. Prior to this, NYHA Class II.   For questions or updates, please contact [facility] Please consult [email or web address] for contact info under    Signed, Darreld Mclean, PA-C  10/02/2020 3:33 PM

## 2020-10-02 NOTE — Plan of Care (Signed)

## 2020-10-02 NOTE — Progress Notes (Signed)
PROGRESS NOTE    Harold Clements  [id number] DOB: 10-Oct-1980 DOA: 09/27/2020 PCP: Kerin Perna, NP   Chief Complaint  Patient presents with   Respiratory Distress   Brief Narrative: 40 year old male with history of uncontrolled hypertension combined systolic/diastolic CHF, [id number] on insulin, CKD stage III, ischemic stroke, history of noncompliance, admitted with sudden onset of shortness of breath and uncontrolled hypertension in 200 needing BiPAP nitro drip and was admitted to ICU.  Patient with diarrhea weaned off nitro drip and BiPAP.  He has been stabilized and transferred to [facility] service 09/30/20. Patient had elevated troponin while in ICU on admission at 57> 176> 77, EKG ST-T wave changes nonspecific, likely demand ischemia [name].  Blood pressure was managed with resuming his home meds but he had uptrending renal function with worsening creatinine, Aldactone and valsartan were held.  Creatinine slowly downtrending.  He will need to follow-up with [facility] as outpatient.  He will need to follow-up with cardiology.  Subjective: Seen this morning he has been more sleepy today nurse mention he did wake up later on but back sleeping. He did answer questions appropriately while on BiPAP after I woke him up  Assessment & Plan:  Acute hypoxic respiratory failure due to hypertensive emergency/pulmonary edema: Weaned off BiPAP.  Now on room air  Hypertensive emergency/Acute Pulmonary edema Blood pressures well controlled.  Off nitro drip off Lasix.  Continue home amlodipine 10, carvedilol 12.5 mg twice daily, minoxidil and hydralazine.  valsartan and Aldactone remains on hold due to AKI.  Monitor intake output and weight daily as below Net IO Since Admission: -3,597.86 mL [10/02/20 1354]  Filed Weights   09/30/20 0413 10/01/20 0500 10/02/20 0423  Weight: 116.6 kg 116.5 kg 117.7 kg   Intake/Output Summary (Last 24 hours) at 10/02/2020 1354 Last data filed at 10/02/2020 1321 Gross per  24 hour  Intake 580 ml  Output 625 ml  Net -45 ml   Elevated troponin/Demand ischemia:Suspect demand mismatch due to hypertensive emergency, trop TD:[id number].  Echo showed slight decrease in his EF 40 to 45% mildly decreased function global hypokinesis severe eccentric LV hypertrophy indeterminate diastolic filling pressure no definite evidence of preserved apical strength. Ef in 12/202 was 45-50% Cardiology see him today.  His EKG showed sinus tachycardia LVH nonspecific ST changes  Chronic combined systolic/diastolic CHF: With pulmonary edema due to hypertensive emergency. Continue to adjust his antihypertensive regimen.  Diuretics on hold due to AKI.  Diabetes mellitus on Defoor-term insulin with hyporglycemia: Hemoglobin A1c borderline controlled 7.6. On 26 units  Mix insulin twice daily insulin at home- was placed on 12 units Lantus twice daily but due to hypoglycemia were discontinued.  Blood sugar fairly stable we will probably put him on 5 units of mix 70/30  twice daily  Recent Labs  Lab 10/01/20 1138 10/01/20 1615 10/01/20 2108 10/02/20 0715 10/02/20 1206  GLUCAP 208* 195* 171* 167* 168*   AKI on CKD stage IIIa: Baseline creatinine around 1.9 in 11/2019.  Followed by [facility].  BUN creatinine uptrending as below-likely CKD has progressed since his last blood work last year.  Discussed with nephrology  Dr Joylene Grapes impersonal this morning creatinine downtrending will need follow-up with [facility] as outpatient.  Recent Labs    11/28/19 1114 09/27/20 2350 09/28/20 0201 09/29/20 0250 09/30/20 0110 10/01/20 0317 10/02/20 0126  BUN 21* 23* 25* 27* 37* 41* 39*  CREATININE 1.98* 2.44* 2.56* 2.92* 3.53* 3.73* 3.32*   Mild hypokalemia replete. Morbid obesity BMI 37:  Will benefit weight loss, PCP follow-up  OSA-continue CPAP at bedtime. More sleepy today- check vbg  Noncompliance -he is aware that he will need to be more compliant with medication and his wife was  reinforcing. Diet Order             Diet renal with fluid restriction Fluid restriction: 1200 mL Fluid; Room service appropriate? Yes; Fluid consistency: Thin  Diet effective now                 Patient's Body mass index is 38.31 kg/m. DVT prophylaxis: heparin injection 5,000 Units Start: 09/28/20 0600 SCDs Start: 09/28/20 0140 Code Status:   Code Status: Full Code  Family Communication: plan of care discussed with patient and his wife at bedside. Status is: Inpatient Remains inpatient appropriate because:Persistent severe electrolyte disturbances and Inpatient level of care appropriate due to severity of illness Dispo: The patient is from: Home              Anticipated d/c is to: Home hopefully tomorrow pending cardiology evaluation.                Patient currently is not medically stable to d/c.   Difficult to place patient No   Unresulted Labs (From admission, onward)     Start     Ordered   09/29/20 [id number]  Basic metabolic panel  Daily,   R      09/28/20 1527   09/29/20 0500  CBC  Daily,   R      09/28/20 1527          Medications reviewed:  Scheduled Meds:  amLODipine  10 mg Oral Daily   aspirin EC  81 mg Oral Daily   carvedilol  12.5 mg Oral BID WC   heparin  5,000 Units Subcutaneous Q8H   hydrALAZINE  100 mg Oral TID   insulin aspart  0-15 Units Subcutaneous TID WC   mouth rinse  15 mL Mouth Rinse BID   minoxidil  10 mg Oral BID   Continuous Infusions: Consultants:see note  Procedures:see note Antimicrobials: Anti-infectives (From admission, onward)    None      Culture/Microbiology    Component Value Date/Time   SDES BLOOD LEFT ANTECUBITAL 08/11/2017 2200   SPECREQUEST  08/11/2017 2200    BOTTLES DRAWN AEROBIC AND ANAEROBIC Blood Culture adequate volume   CULT  08/11/2017 2200    NO GROWTH 5 DAYS Performed at [facility], [place] [address], [place], [place] [address]    REPTSTATUS 08/17/2017 FINAL 08/11/2017 2200    Other culture-see  note  Objective: Vitals: Today's Vitals   10/02/20 0423 10/02/20 0918 10/02/20 1339 10/02/20 1340  BP: (!) 150/86 131/86  138/78  Pulse: 93   92  Resp: '20 18 20   '$ Temp: 98.6 F (37 C) 98.7 F (37.1 C) 98.3 F (36.8 C)   TempSrc: Oral Oral Oral   SpO2: 98% 100%  98%  Weight: 117.7 kg     Height:      PainSc:  0-No pain      Intake/Output Summary (Last 24 hours) at 10/02/2020 1354 Last data filed at 10/02/2020 1321 Gross per 24 hour  Intake 580 ml  Output 625 ml  Net -45 ml    Filed Weights   09/30/20 0413 10/01/20 0500 10/02/20 0423  Weight: 116.6 kg 116.5 kg 117.7 kg   Weight change: 1.179 kg  Intake/Output from previous day: 08/02 0701 - 08/03 0700 In: 720 [P.O.:720] Out:  625 [Urine:625] Intake/Output this shift: Total I/O In: 340 [P.O.:340] Out: 325 [Urine:325] Filed Weights   09/30/20 0413 10/01/20 0500 10/02/20 0423  Weight: 116.6 kg 116.5 kg 117.7 kg   Examination: General exam: AAOx3, obese, morbidly obese,young,older than stated age, weak appearing. HEENT:Oral mucosa moist, Ear/Nose WNL grossly,dentition normal. Respiratory system: bilaterally diminished,no use of accessory muscle Cardiovascular system: S1 & S2 +,No JVD. Gastrointestinal system: Abdomen soft,NT,ND,BS+. Nervous System:Alert, awake, moving extremities and grossly nonfocal. Extremities: No edema, distal peripheral pulses palpable.  Skin: No rashes,no icterus. MSK: Normal muscle bulk,tone, power.   Data Reviewed: I have personally reviewed following labs and imaging studies CBC: Recent Labs  Lab 09/27/20 2350 09/28/20 0011 09/29/20 0250 09/29/20 1219 09/30/20 0110 10/01/20 0317 10/02/20 0126  WBC 11.4*  --  7.9  --  5.8 5.5 5.5  NEUTROABS 5.4  --   --   --   --   --   --   HGB 16.1   < > 15.0 14.3 14.5 14.4 13.9  HCT 48.5   < > 43.5 42.0 43.7 42.2 41.0  MCV 81.4  --  79.5*  --  80.6 79.2* 79.0*  PLT 337  --  271  --  248 270 294   < > = values in this interval not displayed.     Basic Metabolic Panel: Recent Labs  Lab 09/28/20 0201 09/29/20 0250 09/29/20 1219 09/30/20 0110 10/01/20 0317 10/02/20 0126  NA 138 139 139 137 136 138  K 4.0 3.6 3.6 3.6 3.2* 3.4*  CL 104 101  --  103 101 102  CO2 23 26  --  '25 24 24  '$ GLUCOSE 252* 84  --  120* 172* 137*  BUN 25* 27*  --  37* 41* 39*  CREATININE 2.56* 2.92*  --  3.53* 3.73* 3.32*  CALCIUM 8.7* 9.0  --  8.7* 9.0 9.1  MG 1.6* 2.0  --   --   --   --   PHOS 4.2 3.7  --   --   --   --     GFR: Estimated Creatinine Clearance: 38.6 mL/min (A) (by C-G formula based on SCr of 3.32 mg/dL (H)). Liver Function Tests: Recent Labs  Lab 09/27/20 2350 09/28/20 0201  AST 39 27  ALT 26 25  ALKPHOS 99 74  BILITOT 1.0 0.7  PROT 8.3* 7.3  ALBUMIN 3.8 3.3*    No results for input(s): LIPASE, AMYLASE in the last 168 hours. No results for input(s): AMMONIA in the last 168 hours. Coagulation Profile: No results for input(s): INR, PROTIME in the last 168 hours. Cardiac Enzymes: No results for input(s): CKTOTAL, CKMB, CKMBINDEX, TROPONINI in the last 168 hours. BNP (last 3 results) No results for input(s): PROBNP in the last 8760 hours. HbA1C: No results for input(s): HGBA1C in the last 72 hours.  CBG: Recent Labs  Lab 10/01/20 1138 10/01/20 1615 10/01/20 2108 10/02/20 0715 10/02/20 1206  GLUCAP 208* 195* 171* 167* 168*    Lipid Profile: No results for input(s): CHOL, HDL, LDLCALC, TRIG, CHOLHDL, LDLDIRECT in the last 72 hours. Thyroid Function Tests: No results for input(s): TSH, T4TOTAL, FREET4, T3FREE, THYROIDAB in the last 72 hours. Anemia Panel: No results for input(s): VITAMINB12, FOLATE, FERRITIN, TIBC, IRON, RETICCTPCT in the last 72 hours. Sepsis Labs: No results for input(s): PROCALCITON, LATICACIDVEN in the last 168 hours.  Recent Results (from the past 240 hour(s))  Resp Panel by RT-PCR (Flu A&B, Covid) Nasopharyngeal Swab     Status: None  Collection Time: 09/28/20  4:45 AM   Specimen:  Nasopharyngeal Swab; Nasopharyngeal(NP) swabs in vial transport medium  Result Value Ref Range Status   SARS Coronavirus 2 by RT PCR NEGATIVE NEGATIVE Final    Comment: (NOTE) SARS-CoV-2 target nucleic acids are NOT DETECTED.  The SARS-CoV-2 RNA is generally detectable in upper respiratory specimens during the acute phase of infection. The lowest concentration of SARS-CoV-2 viral copies this assay can detect is 138 copies/mL. A negative result does not preclude SARS-Cov-2 infection and should not be used as the sole basis for treatment or other patient management decisions. A negative result may occur with  improper specimen collection/handling, submission of specimen other than nasopharyngeal swab, presence of viral mutation(s) within the areas targeted by this assay, and inadequate number of viral copies(<138 copies/mL). A negative result must be combined with clinical observations, patient history, and epidemiological information. The expected result is Negative.  Fact Sheet for Patients:  EntrepreneurPulse.com.au  Fact Sheet for Healthcare Providers:  IncredibleEmployment.be  This test is no t yet approved or cleared by the [place] FDA and  has been authorized for detection and/or diagnosis of SARS-CoV-2 by FDA under an Emergency Use Authorization (EUA). This EUA will remain  in effect (meaning this test can be used) for the duration of the COVID-19 declaration under Section 564(b)(1) of the Act, 21 U.S.C.section 360bbb-3(b)(1), unless the authorization is terminated  or revoked sooner.       Influenza A by PCR NEGATIVE NEGATIVE Final   Influenza B by PCR NEGATIVE NEGATIVE Final    Comment: (NOTE) The Xpert Xpress SARS-CoV-2/FLU/RSV plus assay is intended as an aid in the diagnosis of influenza from Nasopharyngeal swab specimens and should not be used as a sole basis for treatment. Nasal washings and aspirates are unacceptable for  Xpert Xpress SARS-CoV-2/FLU/RSV testing.  Fact Sheet for Patients: EntrepreneurPulse.com.au  Fact Sheet for Healthcare Providers: IncredibleEmployment.be  This test is not yet approved or cleared by the [place] FDA and has been authorized for detection and/or diagnosis of SARS-CoV-2 by FDA under an Emergency Use Authorization (EUA). This EUA will remain in effect (meaning this test can be used) for the duration of the COVID-19 declaration under Section 564(b)(1) of the Act, 21 U.S.C. section 360bbb-3(b)(1), unless the authorization is terminated or revoked.  Performed at [facility], [place] [address] [place], [place] [address]   MRSA Next Gen by PCR, Nasal     Status: None   Collection Time: 09/28/20  5:52 AM   Specimen: Nasal Mucosa; Nasal Swab  Result Value Ref Range Status   MRSA by PCR Next Gen NOT DETECTED NOT DETECTED Final    Comment: (NOTE) The GeneXpert MRSA Assay (FDA approved for NASAL specimens only), is one component of a comprehensive MRSA colonization surveillance program. It is not intended to diagnose MRSA infection nor to guide or monitor treatment for MRSA infections. Test performance is not FDA approved in patients less than 20 years old. Performed at [facility], [place] [address] [place], [place] [address]   Radiology Studies: No results found.   LOS: 4 days   Antonieta Pert, MD [facility]  10/02/2020, 1:54 PM

## 2020-10-03 ENCOUNTER — Inpatient Hospital Stay (HOSPITAL_COMMUNITY): Payer: Medicaid Other

## 2020-10-03 DIAGNOSIS — I5042 Chronic combined systolic (congestive) and diastolic (congestive) heart failure: Secondary | ICD-10-CM

## 2020-10-03 DIAGNOSIS — I5040 Unspecified combined systolic (congestive) and diastolic (congestive) heart failure: Secondary | ICD-10-CM | POA: Diagnosis not present

## 2020-10-03 DIAGNOSIS — Z8673 Personal history of transient ischemic attack (TIA), and cerebral infarction without residual deficits: Secondary | ICD-10-CM | POA: Diagnosis not present

## 2020-10-03 DIAGNOSIS — E119 Type 2 diabetes mellitus without complications: Secondary | ICD-10-CM | POA: Diagnosis not present

## 2020-10-03 DIAGNOSIS — I161 Hypertensive emergency: Secondary | ICD-10-CM | POA: Diagnosis not present

## 2020-10-03 DIAGNOSIS — I110 Hypertensive heart disease with heart failure: Secondary | ICD-10-CM | POA: Diagnosis not present

## 2020-10-03 DIAGNOSIS — I10 Essential (primary) hypertension: Secondary | ICD-10-CM | POA: Diagnosis not present

## 2020-10-03 LAB — CBC
HCT: 41 % (ref 39.0–52.0)
Hemoglobin: 13.6 g/dL (ref 13.0–17.0)
MCH: 26.5 pg (ref 26.0–34.0)
MCHC: 33.2 g/dL (ref 30.0–36.0)
MCV: 79.9 fL — ABNORMAL LOW (ref 80.0–100.0)
Platelets: 286 10*3/uL (ref 150–400)
RBC: 5.13 MIL/uL (ref 4.22–5.81)
RDW: 13.2 % (ref 11.5–15.5)
WBC: 5.6 10*3/uL (ref 4.0–10.5)
nRBC: 0 % (ref 0.0–0.2)

## 2020-10-03 LAB — NM MYOCAR MULTI W/SPECT W/WALL MOTION / EF
Estimated workload: 1 METS
Exercise duration (min): 14 min
Exercise duration (sec): 50 s
MPHR: 183 {beats}/min
Peak HR: 113 {beats}/min
Percent HR: 61 %
Rest HR: 90 {beats}/min

## 2020-10-03 LAB — GLUCOSE, CAPILLARY
Glucose-Capillary: 136 mg/dL — ABNORMAL HIGH (ref 70–99)
Glucose-Capillary: 186 mg/dL — ABNORMAL HIGH (ref 70–99)

## 2020-10-03 LAB — BASIC METABOLIC PANEL
Anion gap: 11 (ref 5–15)
BUN: 41 mg/dL — ABNORMAL HIGH (ref 6–20)
CO2: 24 mmol/L (ref 22–32)
Calcium: 9.5 mg/dL (ref 8.9–10.3)
Chloride: 101 mmol/L (ref 98–111)
Creatinine, Ser: 3.28 mg/dL — ABNORMAL HIGH (ref 0.61–1.24)
GFR, Estimated: 24 mL/min — ABNORMAL LOW (ref >60)
Glucose, Bld: 124 mg/dL — ABNORMAL HIGH (ref 70–99)
Potassium: 3.5 mmol/L (ref 3.5–5.1)
Sodium: 136 mmol/L (ref 135–145)

## 2020-10-03 MED ORDER — TECHNETIUM TC 99M TETROFOSMIN IV KIT
31.0000 | PACK | Freq: Once | INTRAVENOUS | Status: AC | PRN
  Administered 2020-10-03: 31 via INTRAVENOUS

## 2020-10-03 MED ORDER — ONDANSETRON HCL 4 MG/2ML IJ SOLN
INTRAMUSCULAR | Status: AC
  Administered 2020-10-03: 4 mg
  Filled 2020-10-03: qty 2

## 2020-10-03 MED ORDER — BLOOD GLUCOSE MONITOR KIT: PACK | 0 refills | Status: AC

## 2020-10-03 MED ORDER — ANGIOPLASTY BOOK
Freq: Once | Status: DC
  Filled 2020-10-03: qty 1

## 2020-10-03 MED ORDER — TECHNETIUM TC 99M TETROFOSMIN IV KIT
10.2000 | PACK | Freq: Once | INTRAVENOUS | Status: AC | PRN
  Administered 2020-10-03: 10.2 via INTRAVENOUS

## 2020-10-03 MED ORDER — AMINOPHYLLINE 25 MG/ML IV SOLN
INTRAVENOUS | Status: AC
  Filled 2020-10-03: qty 10

## 2020-10-03 MED ORDER — AMLODIPINE BESYLATE 10 MG PO TABS: 10.0000 mg | ORAL_TABLET | Freq: Every day | ORAL | 1 refills | Status: AC

## 2020-10-03 MED ORDER — INSULIN ASPART PROT & ASPART (70-30 MIX) 100 UNIT/ML ~~LOC~~ SUSP: 5.0000 [IU] | Freq: Two times a day (BID) | SUBCUTANEOUS | 0 refills | Status: AC

## 2020-10-03 MED ORDER — CARVEDILOL 25 MG PO TABS: 25.0000 mg | ORAL_TABLET | Freq: Two times a day (BID) | ORAL | 0 refills | Status: AC

## 2020-10-03 MED ORDER — REGADENOSON 0.4 MG/5ML IV SOLN
INTRAVENOUS | Status: AC
  Filled 2020-10-03: qty 5

## 2020-10-03 MED ORDER — REGADENOSON 0.4 MG/5ML IV SOLN
0.4000 mg | Freq: Once | INTRAVENOUS | Status: AC
  Administered 2020-10-03: 0.4 mg via INTRAVENOUS
  Filled 2020-10-03: qty 5

## 2020-10-03 NOTE — Progress Notes (Signed)
Patient had sudden onset nausea and vomiting. 5 min post lexi-scan administration. Verbal orders for 4 mg IV zofran.

## 2020-10-03 NOTE — Progress Notes (Addendum)
Progress Note  Patient Name: Harold Clements Date of Encounter: 10/03/2020  [facility] Cardiologist: Sanda Klein, MD   Subjective   Breathing ok today, no chest pain  Inpatient Medications    Scheduled Meds:  amLODipine  10 mg Oral Daily   aspirin EC  81 mg Oral Daily   carvedilol  25 mg Oral BID WC   heparin  5,000 Units Subcutaneous Q8H   hydrALAZINE  100 mg Oral TID   insulin aspart  0-15 Units Subcutaneous TID WC   insulin aspart protamine- aspart  5 Units Subcutaneous BID WC   mouth rinse  15 mL Mouth Rinse BID   minoxidil  10 mg Oral BID   Continuous Infusions:  PRN Meds: docusate sodium, pneumococcal 23 valent vaccine, polyethylene glycol   Vital Signs    Vitals:   10/02/20 1339 10/02/20 1340 10/02/20 1926 10/03/20 0422  BP:  138/78 (!) 111/57 (!) 157/85  Pulse:  92  95  Resp: '20  18 19  '$ Temp: 98.3 F (36.8 C)  98.5 F (36.9 C) 98.5 F (36.9 C)  TempSrc: Oral  Oral Oral  SpO2:  98% 98% 100%  Weight:    117.7 kg  Height:        Intake/Output Summary (Last 24 hours) at 10/03/2020 0809 Last data filed at 10/02/2020 1430 Gross per 24 hour  Intake 460 ml  Output 325 ml  Net 135 ml   Last 3 Weights 10/03/2020 10/02/2020 10/01/2020  Weight (lbs) 259 lb 8 oz 259 lb 6.4 oz 256 lb 12.8 oz  Weight (kg) 117.708 kg 117.663 kg 116.484 kg      Telemetry    SR - Personally Reviewed  ECG    None today - Personally Reviewed  Physical Exam   GEN: No acute distress.   Neck: No JVD seen, difficult to assess 2nd body habitus Cardiac: RRR, no murmurs, rubs, or gallops.  Respiratory: Clear to auscultation bilaterally. GI: Soft, nontender, non-distended  MS: No edema; No deformity. Neuro:  Nonfocal  Psych: Normal affect   Labs    High Sensitivity Troponin:   Recent Labs  Lab 09/27/20 2350 09/28/20 0201 10/02/20 0831  TROPONINIHS 57* 176* 77*      Chemistry Recent Labs  Lab 09/27/20 2350 09/28/20 0011 09/28/20 0201 09/29/20 0250 10/01/20 0317  10/02/20 0126 10/03/20 0300  NA 137   < > 138   < > 136 138 136  K 3.9   < > 4.0   < > 3.2* 3.4* 3.5  CL 105  --  104   < > 101 102 101  CO2 23  --  23   < > '24 24 24  '$ GLUCOSE 263*  --  252*   < > 172* 137* 124*  BUN 23*  --  25*   < > 41* 39* 41*  CREATININE 2.44*  --  2.56*   < > 3.73* 3.32* 3.28*  CALCIUM 8.9  --  8.7*   < > 9.0 9.1 9.5  PROT 8.3*  --  7.3  --   --   --   --   ALBUMIN 3.8  --  3.3*  --   --   --   --   AST 39  --  27  --   --   --   --   ALT 26  --  25  --   --   --   --   ALKPHOS 99  --  74  --   --   --   --  BILITOT 1.0  --  0.7  --   --   --   --   GFRNONAA 34*  --  32*   < > 20* 24* 24*  ANIONGAP 9  --  11   < > '11 12 11   '$ < > = values in this interval not displayed.     Hematology Recent Labs  Lab 10/01/20 0317 10/02/20 0126 10/03/20 0300  WBC 5.5 5.5 5.6  RBC 5.33 5.19 5.13  HGB 14.4 13.9 13.6  HCT 42.2 41.0 41.0  MCV 79.2* 79.0* 79.9*  MCH 27.0 26.8 26.5  MCHC 34.1 33.9 33.2  RDW 13.3 13.4 13.2  PLT 270 294 286    BNP Recent Labs  Lab 09/27/20 2350  BNP 457.4*    Lab Results  Component Value Date   TSH 2.986 01/11/2019   Lab Results  Component Value Date   HGBA1C 7.6 (H) 09/29/2020   Lab Results  Component Value Date   CHOL 134 01/12/2019   HDL 32 (L) 01/12/2019   LDLCALC 78 01/12/2019   TRIG 118 01/12/2019   CHOLHDL 4.2 01/12/2019    DDimer No results for input(s): DDIMER in the last 168 hours.   Radiology    DG Chest Port 1 View  Result Date: 09/28/2020 CLINICAL DATA:  Dyspnea EXAM: PORTABLE CHEST 1 VIEW COMPARISON:  None. FINDINGS: There is bibasilar airspace infiltrate, asymmetrically more severe at the right lung base with dense consolidation in keeping with atypical infection or aspiration. No pneumothorax or pleural effusion. Mild cardiomegaly is stable. Pulmonary vascularity is normal. No acute bone abnormality. IMPRESSION: Asymmetric bibasilar pulmonary infiltrates with dense consolidation at the right lung base  in keeping with atypical infection or aspiration. Electronically Signed   By: Fidela Salisbury MD   On: 09/28/2020 00:37   ECHOCARDIOGRAM COMPLETE  Result Date: 09/28/2020    ECHOCARDIOGRAM REPORT   Patient Name:   Harold Clements Date of Exam: 09/28/2020 Medical Rec #:  [id number]    Height:       69.0 in Accession #:    [id number]   Weight:       245.6 lb Date of Birth:  07-31-1980   BSA:          2.254 m Patient Age:    60 years     BP:           168/110 mmHg Patient Gender: M            HR:           89 bpm. Exam Location:  Inpatient Procedure: 2D Echo, 3D Echo, Color Doppler, Cardiac Doppler and Strain Analysis STAT ECHO Indications:    I50.9* Heart failure (unspecified)  History:        Patient has prior history of Echocardiogram examinations, most                 recent 02/02/2019. Risk Factors:Hypertension and Diabetes.  Sonographer:    Raquel Sarna Senior RDCS Referring Phys: [id number] JULIAN F REESE  Sonographer Comments: Scanned supine on bipap IMPRESSIONS  1. Left ventricular ejection fraction, by estimation, is 40 to 45%. Left ventricular ejection fraction by 3D volume is 41 %. The left ventricle has mildly decreased function. The left ventricle demonstrates global hypokinesis. There is severe eccentric left ventricular hypertrophy of the posterior-lateral segment and otherwise moderate LVH noted. Indeterminate diastolic filling due to E-A fusion. The average left ventricular global longitudinal strain is -7.2 %. The global longitudinal strain is abnormal,  no definite evidence of preserved apical strain though apex has slightly better values than remainder of myocardium.  2. Right ventricular systolic function is normal. The right ventricular size is normal. Tricuspid regurgitation signal is inadequate for assessing PA pressure.  3. A small pericardial effusion is present. The pericardial effusion is localized near the right atrium and posterior to the left ventricle. There is no evidence of cardiac tamponade.  4.  The mitral valve is normal in structure. Trivial mitral valve regurgitation. No evidence of mitral stenosis.  5. The aortic valve is grossly normal. Aortic valve regurgitation is trivial. No aortic stenosis is present.  6. The inferior vena cava is dilated in size with >50% respiratory variability, suggesting right atrial pressure of 8 mmHg. Comparison(s): A prior study was performed on 01/11/2019 and 02/02/2019. Prior images reviewed side by side. LVEF has decreased slightly on side by side comparison. FINDINGS  Left Ventricle: Left ventricular ejection fraction, by estimation, is 40 to 45%. Left ventricular ejection fraction by 3D volume is 41 %. The left ventricle has mildly decreased function. The left ventricle demonstrates global hypokinesis. The average left ventricular global longitudinal strain is -7.2 %. The global longitudinal strain is abnormal. The left ventricular internal cavity size was normal in size. There is severe eccentric left ventricular hypertrophy of the posterior-lateral segment. Indeterminate diastolic filling due to E-A fusion. Right Ventricle: The right ventricular size is normal. No increase in right ventricular wall thickness. Right ventricular systolic function is normal. Tricuspid regurgitation signal is inadequate for assessing PA pressure. Left Atrium: Left atrial size was normal in size. Right Atrium: Right atrial size was normal in size. Pericardium: A small pericardial effusion is present. The pericardial effusion is localized near the right atrium and posterior to the left ventricle. There is no evidence of cardiac tamponade. Mitral Valve: The mitral valve is normal in structure. Trivial mitral valve regurgitation. No evidence of mitral valve stenosis. Tricuspid Valve: The tricuspid valve is normal in structure. Tricuspid valve regurgitation is trivial. No evidence of tricuspid stenosis. Aortic Valve: The aortic valve is grossly normal. Aortic valve regurgitation is trivial. No  aortic stenosis is present. Pulmonic Valve: The pulmonic valve was normal in structure. Pulmonic valve regurgitation is trivial. No evidence of pulmonic stenosis. Aorta: Aortic root likely upper limit of normal for age and BSA. The aortic root is normal in size and structure and the ascending aorta was not well visualized. Venous: The inferior vena cava is dilated in size with greater than 50% respiratory variability, suggesting right atrial pressure of 8 mmHg. IAS/Shunts: No atrial level shunt detected by color flow Doppler.  LEFT VENTRICLE PLAX 2D LVIDd:         5.00 cm LVIDs:         3.90 cm         2D LV PW:         1.70 cm         Longitudinal LV IVS:        1.40 cm         Strain LVOT diam:     2.40 cm         2D Strain GLS  -7.2 % LV SV:         65              Avg: LV SV Index:   29 LVOT Area:     4.52 cm        3D Volume EF  LV 3D EF:    Left                                             ventricular                                             ejection                                             fraction by                                             3D volume                                             is 41 %.                                 3D Volume EF:                                3D EF:        41 % RIGHT VENTRICLE RV S prime:     15.60 cm/s TAPSE (M-mode): 2.4 cm LEFT ATRIUM             Index       RIGHT ATRIUM           Index LA diam:        4.20 cm 1.86 cm/m  RA Area:     17.40 cm LA Vol (A2C):   63.2 ml 28.03 ml/m RA Volume:   41.80 ml  18.54 ml/m LA Vol (A4C):   56.4 ml 25.02 ml/m LA Biplane Vol: 64.4 ml 28.57 ml/m  AORTIC VALVE LVOT Vmax:   89.50 cm/s LVOT Vmean:  63.200 cm/s LVOT VTI:    0.144 m  AORTA Ao Root diam: 3.70 cm  SHUNTS Systemic VTI:  0.14 m Systemic Diam: 2.40 cm Cherlynn Kaiser MD Electronically signed by Cherlynn Kaiser MD Signature Date/Time: 09/28/2020/8:39:10 AM    Final      Cardiac Studies   MYOVIEW: ordered  ECHO: 09/28/2020  1.  Left ventricular ejection fraction, by estimation, is 40 to 45%. Left ventricular ejection fraction by 3D volume is 41 %. The left ventricle has mildly decreased function. The left ventricle demonstrates global hypokinesis. There is severe eccentric  left ventricular hypertrophy of the posterior-lateral segment and  otherwise moderate LVH noted. Indeterminate diastolic filling due to E-A fusion. The average left ventricular global longitudinal strain is -7.2 %. The global longitudinal strain is abnormal, no definite evidence of preserved apical strain though apex has slightly better values than remainder of myocardium.   2. Right ventricular systolic function is normal. The right ventricular size is normal. Tricuspid regurgitation signal  is inadequate for assessing PA pressure.   3. A small pericardial effusion is present. The pericardial effusion is localized near the right atrium and posterior to the left ventricle. There is no evidence of cardiac tamponade.   4. The mitral valve is normal in structure. Trivial mitral valve  regurgitation. No evidence of mitral stenosis.   5. The aortic valve is grossly normal. Aortic valve regurgitation is  trivial. No aortic stenosis is present.   6. The inferior vena cava is dilated in size with >50% respiratory  variability, suggesting right atrial pressure of 8 mmHg.   Patient Profile     40 y.o. 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  Assessment & Plan    HTN Emergency - had been off meds a few days prior to admission - restarted on amlodipine 10 mg, Coreg restarted and increased to 25 mg bid, on home hydralazine 100 mg tid and minoxidil 10 mg bid. - Valsartan and spiro held 2nd decreased renal  function  2. Acute pulm edema, Acute on chronic combined CHF - echo above, EF unchanged - initially required BiPAP, now on O2 - Lasix IV given at first but stopped 2nd worsening renal function - likey HTN-related NICM, but no hx ischemic eval >> MV today  Otherwise, per IM  For questions or updates, please contact [facility] Please consult [email or web address] for contact info under        Signed, Rosaria Ferries, PA-C  10/03/2020, 8:09 AM

## 2020-10-03 NOTE — Discharge Summary (Signed)
Physician Discharge Summary  Harold Clements BTD:[id number] DOB: [birth year] DOA: 09/27/2020  PCP: Kerin Perna, NP  Admit date: 09/27/2020 Discharge date: 10/03/2020  Admitted From: home Disposition:  home  Recommendations for Outpatient Follow-up:  Follow up with PCP in 1-2 weeks Follow-up with [facility] physician and Dr Arpelar/[facility] cardiology Please obtain BMP in one week Please follow up on the following pending results:  Home Health:no  Equipment/Devices: none  Discharge Condition: Stable Code Status:   Code Status: Full Code Diet recommendation:  Diet Order             Diet renal with fluid restriction Fluid restriction: 1200 mL Fluid; Room service appropriate? Yes; Fluid consistency: Thin  Diet effective now           Diet - low sodium heart healthy                 Brief/Interim Summary: 40 year old male with history of uncontrolled hypertension combined systolic/diastolic CHF, X6IW on insulin, CKD stage III, ischemic stroke, history of noncompliance, admitted with sudden onset of shortness of breath and uncontrolled hypertension in 200 needing BiPAP nitro drip and was admitted to ICU.  Patient with diarrhea weaned off nitro drip and BiPAP.  He has been stabilized and transferred to [facility] service 09/30/20. Patient had elevated troponin while in ICU on admission at 57> 176> 77, EKG ST-T wave changes nonspecific, likely demand ischemia [name].  Blood pressure was managed with resuming his home meds but he had uptrending renal function with worsening creatinine, Aldactone and valsartan were held.  Creatinine slowly downtrending.  Seen by cardiology underwent stress test for elevated troponin. At this time blood pressure is well controlled.  Discharge Diagnoses:   Acute hypoxic respiratory failure due to hypertensive emergency/pulmonary edema: Weaned off BiPAP.  Now on room air  Hypertensive emergency/Acute Pulmonary edema Blood pressures well controlled.  Off nitro  drip off Lasix.  Continue on amlodipine 10, carvedilol 12.5 mg twice daily, minoxidil and hydralazine.  valsartan and Aldactone held due to AKI.  Weight overall stable with slight uptrending weight.  Continue low-salt diet fluid striction as outpatient.   Net IO Since Admission: -3,477.86 mL [10/03/20 1602]  Filed Weights   10/01/20 0500 10/02/20 0423 10/03/20 0422  Weight: 116.5 kg 117.7 kg 117.7 kg  No intake or output data in the 24 hours ending 10/03/20 1602  Elevated troponin/suspect demand ischemia:Suspect demand mismatch due to hypertensive emergency, trop 80>321>22.  Echo showed slight decrease in his EF 40 to 45% mildly decreased function global hypokinesis severe eccentric LV hypertrophy indeterminate diastolic filling pressure no definite evidence of preserved apical strength. Ef in 12/202 was 45-50.  His EKG showed sinus tachycardia LVH nonspecific ST changes,seen by cardio and had stress test done 10/03/20:"1.Moderate-sized region of mildly decreased uptake in the mid to basal inferior wall during stress and rest suggestive of infarct. No reversible defects identified.2.Global hypokinesis.3.Left ventricular ejection fraction 33% 4.Non invasive risk stratification*: High" Spoke w/ Cardio-cardio no reversible ischemia- OP f/u.Has pericardial effusion- needs f/u with cardio for repeat echo- since he is on minoxidil.He is on asa, coreg amlodipine- discussed w/ Dr Lauretta Chester will fu as OP.  Chronic combined systolic/diastolic CHF: With pulmonary edema due to hypertensive emergency. Continue to adjust his antihypertensive regimen.Diuretics on hold due to AKI.  Diabetes mellitus on Geyer-term insulin with hyporglycemia: Hemoglobin A1c borderline controlled 7.6. On 26 units  Mix insulin twice daily insulin at home- was placed on 12 units Lantus twice daily but due to  hypoglycemia were discontinued.  Blood sugar fairly stable we will probably put him on 5 units of mix 70/30  twice daily  on d.c and  have his see his pcp. Recent Labs  Lab 10/02/20 1206 10/02/20 1646 10/02/20 2050 10/03/20 0754 10/03/20 1231  GLUCAP 168* 175* 212* 136* 186*  AKI on CKD stage IIIa: Baseline creatinine around 1.9 in 11/2019.  Followed by [facility].  BUN creatinine uptrending as below-likely CKD has progressed since his last blood work last year.  Discussed with nephrology  Dr Joylene Grapes- creatinine downtrending will need follow-up with [facility] as outpatient.  Recent Labs    11/28/19 1114 09/27/20 2350 09/28/20 0201 09/29/20 0250 09/30/20 0110 10/01/20 0317 10/02/20 0126 10/03/20 0300  BUN 21* 23* 25* 27* 37* 41* 39* 41*  CREATININE 1.98* 2.44* 2.56* 2.92* 3.53* 3.73* 3.32* 3.28*  Mild hypokalemia repleted. Morbid obesity BMI 37: Will benefit weight loss, PCP follow-up  OSA-continue CPAP at bedtime. More sleepy today- check vbg  Noncompliance -he is aware that he will need to be more compliant with medication and his wife was reinforcing.  Consults: Nephrology Cardiology   Subjective: Underwent stress test today episode of vomiting during stress test. Discharge Exam: Vitals:   10/03/20 1007 10/03/20 1346  BP: 137/82 (!) 150/88  Pulse:  93  Resp:  18  Temp:  97.9 F (36.6 C)  SpO2:     General: Pt is alert, awake, not in acute distress Cardiovascular: RRR, S1/S2 +, no rubs, no gallops Respiratory: CTA bilaterally, no wheezing, no rhonchi Abdominal: Soft, NT, ND, bowel sounds + Extremities: no edema, no cyanosis  Discharge Instructions  Discharge Instructions     (HEART FAILURE PATIENTS) Call MD:  Anytime you have any of the following symptoms: 1) 3 pound weight gain in 24 hours or 5 pounds in 1 week 2) shortness of breath, with or without a dry hacking cough 3) swelling in the hands, feet or stomach 4) if you have to sleep on extra pillows at night in order to breathe.   Complete by: As directed    Diet - low sodium heart healthy   Complete by: As directed     Discharge instructions   Complete by: As directed    Please call call MD or return to ER for similar or worsening recurring problem that brought you to hospital or if any fever,nausea/vomiting,abdominal pain, uncontrolled pain, chest pain,  shortness of breath or any other alarming symptoms.  Get BMP Checked from PCP or [facility] in 1 wk.   Please follow-up your doctor as instructed in a week time and call the office for appointment.  Please avoid alcohol, smoking, or any other illicit substance and maintain healthy habits including taking your regular medications as prescribed.  You were cared for by a hospitalist during your hospital stay. If you have any questions about your discharge medications or the care you received while you were in the hospital after you are discharged, you can call the unit and ask to speak with the hospitalist on call if the hospitalist that took care of you is not available.  Once you are discharged, your primary care physician will handle any further medical issues. Please note that NO REFILLS for any discharge medications will be authorized once you are discharged, as it is imperative that you return to your primary care physician (or establish a relationship with a primary care physician if you do not have one) for your aftercare needs so that they can  reassess your need for medications and monitor your lab values   Increase activity slowly   Complete by: As directed       Allergies as of 10/03/2020       Reactions   Zithromax [azithromycin Dihydrate] Anaphylaxis   Kiwi Extract Other (See Comments)   Tongue goes numb        Medication List     STOP taking these medications    NovoLIN 70/30 Kwikpen (70-30) 100 UNIT/ML KwikPen Generic drug: insulin isophane & regular human KwikPen   spironolactone 25 MG tablet Commonly known as: ALDACTONE   valsartan 40 MG tablet Commonly known as: DIOVAN       TAKE these medications    amLODipine 10  MG tablet Commonly known as: NORVASC Take 1 tablet (10 mg total) by mouth daily.   aspirin EC 81 MG tablet Take 81 mg by mouth daily. Swallow whole.   blood glucose meter kit and supplies Kit Dispense based on patient and insurance preference. Use up to four times daily as directed.   carvedilol 25 MG tablet Commonly known as: COREG Take 1 tablet (25 mg total) by mouth 2 (two) times daily with a meal. What changed: how much to take   hydrALAZINE 100 MG tablet Commonly known as: APRESOLINE Take 100 mg by mouth 3 (three) times daily.   insulin aspart protamine- aspart (70-30) 100 UNIT/ML injection Commonly known as: NOVOLOG MIX 70/30 Inject 0.05 mLs (5 Units total) into the skin 2 (two) times daily with a meal.   minoxidil 10 MG tablet Commonly known as: LONITEN Take 1 tablet (10 mg total) by mouth 2 (two) times daily.   Vitamin D (Ergocalciferol) 1.25 MG (50000 UNIT) Caps capsule Commonly known as: DRISDOL Take 50,000 Units by mouth every 7 (seven) days.        Follow-up Information     Kerin Perna, NP Follow up in 1 week(s).   Specialty: Internal Medicine Contact information: [place] [address] [phone]         Sanda Klein, MD .   Specialty: Cardiology Contact information: [address] [place] [address] [phone]         Reesa Chew, MD Follow up in 1 week(s).   Specialty: Internal Medicine Contact information: [place] [address] [phone]                Allergies  Allergen Reactions   Zithromax [Azithromycin Dihydrate] Anaphylaxis   Kiwi Extract Other (See Comments)    Tongue goes numb    The results of significant diagnostics from this hospitalization (including imaging, microbiology, ancillary and laboratory) are listed below for reference.    Microbiology: Recent Results (from the past 240 hour(s))  Resp Panel by RT-PCR (Flu A&B, Covid)  Nasopharyngeal Swab     Status: None   Collection Time: 09/28/20  4:45 AM   Specimen: Nasopharyngeal Swab; Nasopharyngeal(NP) swabs in vial transport medium  Result Value Ref Range Status   SARS Coronavirus 2 by RT PCR NEGATIVE NEGATIVE Final    Comment: (NOTE) SARS-CoV-2 target nucleic acids are NOT DETECTED.  The SARS-CoV-2 RNA is generally detectable in upper respiratory specimens during the acute phase of infection. The lowest concentration of SARS-CoV-2 viral copies this assay can detect is 138 copies/mL. A negative result does not preclude SARS-Cov-2 infection and should not be used as the sole basis for treatment or other patient management decisions. A negative result may occur with  improper specimen collection/handling, submission of specimen other than nasopharyngeal swab, presence of viral mutation(s) within the areas targeted by this assay, and inadequate number of viral copies(<138 copies/mL). A negative result must be combined with clinical observations, patient history, and epidemiological information. The expected result is Negative.  Fact Sheet for Patients:  EntrepreneurPulse.com.au  Fact Sheet for Healthcare Providers:  IncredibleEmployment.be  This test is no t yet approved or cleared by the [place] FDA and  has been authorized for detection and/or diagnosis of SARS-CoV-2 by FDA under an Emergency Use Authorization (EUA). This EUA will remain  in effect (meaning this test can be used) for the duration of the COVID-19 declaration under Section 564(b)(1) of the Act, 21 U.S.C.section 360bbb-3(b)(1), unless the authorization is terminated  or revoked sooner.       Influenza A by PCR NEGATIVE NEGATIVE Final   Influenza B by PCR NEGATIVE NEGATIVE Final    Comment: (NOTE) The Xpert Xpress SARS-CoV-2/FLU/RSV plus assay is intended as an aid in the diagnosis of influenza from Nasopharyngeal swab specimens and should not be  used as a sole basis for treatment. Nasal washings and aspirates are unacceptable for Xpert Xpress SARS-CoV-2/FLU/RSV testing.  Fact Sheet for Patients: EntrepreneurPulse.com.au  Fact Sheet for Healthcare Providers: IncredibleEmployment.be  This test is not yet approved or cleared by the [place] FDA and has been authorized for detection and/or diagnosis of SARS-CoV-2 by FDA under an Emergency Use Authorization (EUA). This EUA will remain in effect (meaning this test can be used) for the duration of the COVID-19 declaration under Section 564(b)(1) of the Act, 21 U.S.C. section 360bbb-3(b)(1), unless the authorization is terminated or revoked.  Performed at [facility], [place] [address], [place], [place] [address]   MRSA Next Gen by PCR, Nasal     Status: None   Collection Time: 09/28/20  5:52 AM   Specimen: Nasal Mucosa; Nasal Swab  Result Value Ref Range Status   MRSA by PCR Next Gen NOT DETECTED NOT DETECTED Final    Comment: (NOTE) The GeneXpert MRSA Assay (FDA approved for NASAL specimens only), is one component of a comprehensive MRSA colonization surveillance program. It is not intended to diagnose MRSA infection nor to guide or monitor treatment for MRSA infections. Test performance is not FDA approved in patients less than 54 years old. Performed at [facility], [place] [address], [place], [place] [address]     Procedures/Studies: NM Myocar Multi W/Spect Tamela Oddi Motion / EF  Result Date: 10/03/2020 CLINICAL DATA:  Uncontrolled hypertension Systolic/diastolic CHF Type 2 diabetes CKD Ischemic stroke EXAM: MYOCARDIAL IMAGING WITH SPECT (REST AND PHARMACOLOGIC-STRESS) GATED LEFT VENTRICULAR WALL MOTION STUDY LEFT VENTRICULAR EJECTION FRACTION TECHNIQUE: Standard myocardial SPECT imaging was performed after resting intravenous injection of 10.2 mCi Tc-17m tetrofosmin. Subsequently, intravenous infusion of Lexiscan was performed  under the supervision of the Cardiology staff. At peak effect of the drug, 31.0 mCi Tc-54m tetrofosmin was injected intravenously and standard myocardial SPECT imaging was performed. Quantitative gated imaging was also performed to evaluate left ventricular wall motion, and estimate left ventricular ejection fraction. COMPARISON:  None. FINDINGS: Perfusion: Moderate-size region mildly decreased uptake seen in the mid to basal inferior wall during stress and rest suggestive of an infarct. No reversible defects identified. Wall Motion: Global hypokinesis. Left Ventricular Ejection Fraction: 33 % End diastolic volume 732 ml End systolic volume 76 ml IMPRESSION: 1. Moderate-sized region of mildly decreased uptake in the mid to basal inferior wall during stress and rest  suggestive of infarct. No reversible defects identified. 2. Global hypokinesis. 3. Left ventricular ejection fraction 33% 4. Non invasive risk stratification*: High *2012 Appropriate Use Criteria for Coronary Revascularization Focused Update: J Am Coll Cardiol. 1601;09(3):235-573. http://content.airportbarriers.com.aspx?articleid=[email or web address] Electronically Signed   By: Miachel Roux M.D.   On: 10/03/2020 15:14   DG Chest Port 1 View  Result Date: 09/28/2020 CLINICAL DATA:  Dyspnea EXAM: PORTABLE CHEST 1 VIEW COMPARISON:  None. FINDINGS: There is bibasilar airspace infiltrate, asymmetrically more severe at the right lung base with dense consolidation in keeping with atypical infection or aspiration. No pneumothorax or pleural effusion. Mild cardiomegaly is stable. Pulmonary vascularity is normal. No acute bone abnormality. IMPRESSION: Asymmetric bibasilar pulmonary infiltrates with dense consolidation at the right lung base in keeping with atypical infection or aspiration. Electronically Signed   By: Fidela Salisbury MD   On: 09/28/2020 00:37   ECHOCARDIOGRAM COMPLETE  Result Date: 09/28/2020    ECHOCARDIOGRAM REPORT   Patient Name:   COLBEY WIRTANEN Yorke Date  of Exam: 09/28/2020 Medical Rec #:  [id number]    Height:       69.0 in Accession #:    [id number]   Weight:       245.6 lb Date of Birth:  [birth year]   BSA:          2.254 m Patient Age:    35 years     BP:           168/110 mmHg Patient Gender: M            HR:           89 bpm. Exam Location:  Inpatient Procedure: 2D Echo, 3D Echo, Color Doppler, Cardiac Doppler and Strain Analysis STAT ECHO Indications:    I50.9* Heart failure (unspecified)  History:        Patient has prior history of Echocardiogram examinations, most                 recent 02/02/2019. Risk Factors:Hypertension and Diabetes.  Sonographer:    Raquel Sarna Senior RDCS Referring Phys: [id number] JULIAN F REESE  Sonographer Comments: Scanned supine on bipap IMPRESSIONS  1. Left ventricular ejection fraction, by estimation, is 40 to 45%. Left ventricular ejection fraction by 3D volume is 41 %. The left ventricle has mildly decreased function. The left ventricle demonstrates global hypokinesis. There is severe eccentric left ventricular hypertrophy of the posterior-lateral segment and otherwise moderate LVH noted. Indeterminate diastolic filling due to E-A fusion. The average left ventricular global longitudinal strain is -7.2 %. The global longitudinal strain is abnormal, no definite evidence of preserved apical strain though apex has slightly better values than remainder of myocardium.  2. Right ventricular systolic function is normal. The right ventricular size is normal. Tricuspid regurgitation signal is inadequate for assessing PA pressure.  3. A small pericardial effusion is present. The pericardial effusion is localized near the right atrium and posterior to the left ventricle. There is no evidence of cardiac tamponade.  4. The mitral valve is normal in structure. Trivial mitral valve regurgitation. No evidence of mitral stenosis.  5. The aortic valve is grossly normal. Aortic valve regurgitation is trivial. No aortic stenosis is present.  6. The inferior  vena cava is dilated in size with >50% respiratory variability, suggesting right atrial pressure of 8 mmHg. Comparison(s): A prior study was performed on 01/11/2019 and 02/02/2019. Prior images reviewed side by side. LVEF has decreased slightly on side by side comparison. FINDINGS  Left Ventricle: Left ventricular  ejection fraction, by estimation, is 40 to 45%. Left ventricular ejection fraction by 3D volume is 41 %. The left ventricle has mildly decreased function. The left ventricle demonstrates global hypokinesis. The average left ventricular global longitudinal strain is -7.2 %. The global longitudinal strain is abnormal. The left ventricular internal cavity size was normal in size. There is severe eccentric left ventricular hypertrophy of the posterior-lateral segment. Indeterminate diastolic filling due to E-A fusion. Right Ventricle: The right ventricular size is normal. No increase in right ventricular wall thickness. Right ventricular systolic function is normal. Tricuspid regurgitation signal is inadequate for assessing PA pressure. Left Atrium: Left atrial size was normal in size. Right Atrium: Right atrial size was normal in size. Pericardium: A small pericardial effusion is present. The pericardial effusion is localized near the right atrium and posterior to the left ventricle. There is no evidence of cardiac tamponade. Mitral Valve: The mitral valve is normal in structure. Trivial mitral valve regurgitation. No evidence of mitral valve stenosis. Tricuspid Valve: The tricuspid valve is normal in structure. Tricuspid valve regurgitation is trivial. No evidence of tricuspid stenosis. Aortic Valve: The aortic valve is grossly normal. Aortic valve regurgitation is trivial. No aortic stenosis is present. Pulmonic Valve: The pulmonic valve was normal in structure. Pulmonic valve regurgitation is trivial. No evidence of pulmonic stenosis. Aorta: Aortic root likely upper limit of normal for age and BSA. The  aortic root is normal in size and structure and the ascending aorta was not well visualized. Venous: The inferior vena cava is dilated in size with greater than 50% respiratory variability, suggesting right atrial pressure of 8 mmHg. IAS/Shunts: No atrial level shunt detected by color flow Doppler.  LEFT VENTRICLE PLAX 2D LVIDd:         5.00 cm LVIDs:         3.90 cm         2D LV PW:         1.70 cm         Longitudinal LV IVS:        1.40 cm         Strain LVOT diam:     2.40 cm         2D Strain GLS  -7.2 % LV SV:         65              Avg: LV SV Index:   29 LVOT Area:     4.52 cm        3D Volume EF                                LV 3D EF:    Left                                             ventricular                                             ejection  fraction by                                             3D volume                                             is 41 %.                                 3D Volume EF:                                3D EF:        41 % RIGHT VENTRICLE RV S prime:     15.60 cm/s TAPSE (M-mode): 2.4 cm LEFT ATRIUM             Index       RIGHT ATRIUM           Index LA diam:        4.20 cm 1.86 cm/m  RA Area:     17.40 cm LA Vol (A2C):   63.2 ml 28.03 ml/m RA Volume:   41.80 ml  18.54 ml/m LA Vol (A4C):   56.4 ml 25.02 ml/m LA Biplane Vol: 64.4 ml 28.57 ml/m  AORTIC VALVE LVOT Vmax:   89.50 cm/s LVOT Vmean:  63.200 cm/s LVOT VTI:    0.144 m  AORTA Ao Root diam: 3.70 cm  SHUNTS Systemic VTI:  0.14 m Systemic Diam: 2.40 cm Cherlynn Kaiser MD Electronically signed by Cherlynn Kaiser MD Signature Date/Time: 09/28/2020/8:39:10 AM    Final     Labs: BNP (last 3 results) Recent Labs    09/27/20 2350  BNP 027.2*   Basic Metabolic Panel: Recent Labs  Lab 09/28/20 0201 09/29/20 0250 09/29/20 1219 09/30/20 0110 10/01/20 0317 10/02/20 0126 10/03/20 0300  NA 138 139 139 137 136 138 136  K 4.0 3.6 3.6 3.6 3.2* 3.4* 3.5  CL 104  101  --  103 101 102 101  CO2 23 26  --  _0 GLUCOSE 252* 84  --  120* 172* 137* 124*  BUN 25* 27*  --  37* 41* 39* 41*  CREATININE 2.56* 2.92*  --  3.53* 3.73* 3.32* 3.28*  CALCIUM 8.7* 9.0  --  8.7* 9.0 9.1 9.5  MG 1.6* 2.0  --   --   --   --   --   PHOS 4.2 3.7  --   --   --   --   --    Liver Function Tests: Recent Labs  Lab 09/27/20 2350 09/28/20 0201  AST 39 27  ALT 26 25  ALKPHOS 99 74  BILITOT 1.0 0.7  PROT 8.3* 7.3  ALBUMIN 3.8 3.3*   No results for input(s): LIPASE, AMYLASE in the last 168 hours. No results for input(s): AMMONIA in the last 168 hours. CBC: Recent Labs  Lab 09/27/20 2350 09/28/20 0011 09/29/20 0250 09/29/20 1219 09/30/20 0110 10/01/20 0317 10/02/20 0126 10/03/20 0300  WBC 11.4*  --  7.9  --  5.8 5.5 5.5 5.6  NEUTROABS 5.4  --   --   --   --   --   --   --  HGB 16.1   < > 15.0 14.3 14.5 14.4 13.9 13.6  HCT 48.5   < > 43.5 42.0 43.7 42.2 41.0 41.0  MCV 81.4  --  79.5*  --  80.6 79.2* 79.0* 79.9*  PLT 337  --  271  --  248 270 294 286   < > = values in this interval not displayed.   Cardiac Enzymes: No results for input(s): CKTOTAL, CKMB, CKMBINDEX, TROPONINI in the last 168 hours. BNP: Invalid input(s): POCBNP CBG: Recent Labs  Lab 10/02/20 1206 10/02/20 1646 10/02/20 2050 10/03/20 0754 10/03/20 1231  GLUCAP 168* 175* 212* 136* 186*   D-Dimer No results for input(s): DDIMER in the last 72 hours. Hgb A1c No results for input(s): HGBA1C in the last 72 hours. Lipid Profile No results for input(s): CHOL, HDL, LDLCALC, TRIG, CHOLHDL, LDLDIRECT in the last 72 hours. Thyroid function studies No results for input(s): TSH, T4TOTAL, T3FREE, THYROIDAB in the last 72 hours.  Invalid input(s): FREET3 Anemia work up No results for input(s): VITAMINB12, FOLATE, FERRITIN, TIBC, IRON, RETICCTPCT in the last 72 hours. Urinalysis    Component Value Date/Time   COLORURINE YELLOW 10/01/2020 1205   APPEARANCEUR HAZY (A) 10/01/2020  1205   LABSPEC 1.014 10/01/2020 1205   PHURINE 5.0 10/01/2020 1205   GLUCOSEU NEGATIVE 10/01/2020 1205   HGBUR NEGATIVE 10/01/2020 1205   [place] 10/01/2020 1205   KETONESUR NEGATIVE 10/01/2020 1205   PROTEINUR 100 (A) 10/01/2020 1205   UROBILINOGEN 1.0 08/12/2014 1200   NITRITE NEGATIVE 10/01/2020 1205   LEUKOCYTESUR NEGATIVE 10/01/2020 1205   Sepsis Labs Invalid input(s): PROCALCITONIN,  WBC,  LACTICIDVEN Microbiology Recent Results (from the past 240 hour(s))  Resp Panel by RT-PCR (Flu A&B, Covid) Nasopharyngeal Swab     Status: None   Collection Time: 09/28/20  4:45 AM   Specimen: Nasopharyngeal Swab; Nasopharyngeal(NP) swabs in vial transport medium  Result Value Ref Range Status   SARS Coronavirus 2 by RT PCR NEGATIVE NEGATIVE Final    Comment: (NOTE) SARS-CoV-2 target nucleic acids are NOT DETECTED.  The SARS-CoV-2 RNA is generally detectable in upper respiratory specimens during the acute phase of infection. The lowest concentration of SARS-CoV-2 viral copies this assay can detect is 138 copies/mL. A negative result does not preclude SARS-Cov-2 infection and should not be used as the sole basis for treatment or other patient management decisions. A negative result may occur with  improper specimen collection/handling, submission of specimen other than nasopharyngeal swab, presence of viral mutation(s) within the areas targeted by this assay, and inadequate number of viral copies(<138 copies/mL). A negative result must be combined with clinical observations, patient history, and epidemiological information. The expected result is Negative.  Fact Sheet for Patients:  EntrepreneurPulse.com.au  Fact Sheet for Healthcare Providers:  IncredibleEmployment.be  This test is no t yet approved or cleared by the [place] FDA and  has been authorized for detection and/or diagnosis of SARS-CoV-2 by FDA under an Emergency Use  Authorization (EUA). This EUA will remain  in effect (meaning this test can be used) for the duration of the COVID-19 declaration under Section 564(b)(1) of the Act, 21 U.S.C.section 360bbb-3(b)(1), unless the authorization is terminated  or revoked sooner.       Influenza A by PCR NEGATIVE NEGATIVE Final   Influenza B by PCR NEGATIVE NEGATIVE Final    Comment: (NOTE) The Xpert Xpress SARS-CoV-2/FLU/RSV plus assay is intended as an aid in the diagnosis of influenza from Nasopharyngeal swab specimens and should not be  used as a sole basis for treatment. Nasal washings and aspirates are unacceptable for Xpert Xpress SARS-CoV-2/FLU/RSV testing.  Fact Sheet for Patients: EntrepreneurPulse.com.au  Fact Sheet for Healthcare Providers: IncredibleEmployment.be  This test is not yet approved or cleared by the [place] FDA and has been authorized for detection and/or diagnosis of SARS-CoV-2 by FDA under an Emergency Use Authorization (EUA). This EUA will remain in effect (meaning this test can be used) for the duration of the COVID-19 declaration under Section 564(b)(1) of the Act, 21 U.S.C. section 360bbb-3(b)(1), unless the authorization is terminated or revoked.  Performed at [facility], [place] [address] [place], [place] [address]   MRSA Next Gen by PCR, Nasal     Status: None   Collection Time: 09/28/20  5:52 AM   Specimen: Nasal Mucosa; Nasal Swab  Result Value Ref Range Status   MRSA by PCR Next Gen NOT DETECTED NOT DETECTED Final    Comment: (NOTE) The GeneXpert MRSA Assay (FDA approved for NASAL specimens only), is one component of a comprehensive MRSA colonization surveillance program. It is not intended to diagnose MRSA infection nor to guide or monitor treatment for MRSA infections. Test performance is not FDA approved in patients less than 3 years old. Performed at [facility], [place] [address], [place],  [place] [address]      Time coordinating discharge: 35 minutes  SIGNED: Antonieta Pert, MD  [facility] 10/03/2020, 4:02 PM  If 7PM-7AM, please contact night-coverage [email or web address]

## 2020-10-03 NOTE — Progress Notes (Addendum)
   Harold Clements presented for a nuclear stress test today.  No immediate complications.  Stress imaging is pending at this time.  Preliminary EKG findings may be listed in the chart, but the stress test result will not be finalized until perfusion imaging is complete.  1 day study, [place] to read.  Rosaria Ferries, PA-C 10/03/2020, 10:25 AM

## 2020-10-04 ENCOUNTER — Telehealth: Payer: Self-pay

## 2020-10-04 NOTE — Telephone Encounter (Signed)
Transition Care Management Unsuccessful Follow-up Telephone Call  Date of discharge and from where:  10/03/2020-[place]   Attempts:  1st Attempt  Reason for unsuccessful TCM follow-up call:  Unable to reach patient

## 2020-10-04 NOTE — Telephone Encounter (Signed)
Transition Care Management Unsuccessful Follow-up Telephone Call  Date of discharge and from where: [facility] on 10/03/2020  Attempts:  1st Attempt  Reason for unsuccessful TCM follow-up call:  Left voice message unable to reach at this time.  Pt is scheduled with NP Juluis Mire on 10/08/2020

## 2020-10-07 ENCOUNTER — Telehealth: Payer: Self-pay

## 2020-10-07 NOTE — Telephone Encounter (Signed)
Transition Care Management Unsuccessful Follow-up Telephone Call  Date of discharge and from where:  10/03/2020, [facility]   Attempts:  2nd Attempt  Reason for unsuccessful TCM follow-up call:  Unable to leave message - call placed to # [phone], his wife, Judeen Hammans answered and said he was not home and he could be reached on his # [phone].  Call placed to his phone number  and the voicemail was full.  He has appointment with Juluis Mire, NP 10/08/2020.

## 2020-10-08 ENCOUNTER — Telehealth: Payer: Self-pay

## 2020-10-08 ENCOUNTER — Encounter (INDEPENDENT_AMBULATORY_CARE_PROVIDER_SITE_OTHER): Payer: Medicaid Other | Admitting: Nurse Practitioner

## 2020-10-08 NOTE — Telephone Encounter (Signed)
Transition Care Management Unsuccessful Follow-up Telephone Call  Date of discharge and from where:  10/03/2020, [facility]   Attempts:  3rd Attempt  Reason for unsuccessful TCM follow-up call:  Left voice message on # [phone], call back requested to this CM. Call placed to # [phone] and voicemail was full.  Patient had appointment today at [facility].  The phone number and appointment information was on his hospital discharge AVS.

## 2020-10-10 ENCOUNTER — Ambulatory Visit: Payer: Medicaid Other

## 2020-10-10 NOTE — Progress Notes (Deleted)
Patient ID: Harold Clements                 DOB: Jan 19, 1981                      MRN: [id number]     HPI: Harold Clements is a 40 y.o. male referred by Dr. Marland Kitchen to HTN clinic. PMH is significant for  Current HTN meds:  Previously tried:  BP goal:   Family History:   Social History:   Diet:   Exercise:   Home BP readings:   Wt Readings from Last 3 Encounters:  10/03/20 259 lb 8 oz (117.7 kg)  09/04/20 262 lb 6.4 oz (119 kg)  12/13/19 271 lb 12.8 oz (123.3 kg)   BP Readings from Last 3 Encounters:  10/03/20 137/80  09/04/20 (!) 215/125  04/08/20 (!) 238/150   Pulse Readings from Last 3 Encounters:  10/03/20 93  09/04/20 94  04/08/20 99    Renal function: Estimated Creatinine Clearance: 39 mL/min (A) (by C-G formula based on SCr of 3.28 mg/dL (H)).  Past Medical History:  Diagnosis Date   CKD (chronic kidney disease) stage 2, GFR 60-89 ml/min    Diabetes mellitus without complication ([place])    Ear pain 07/2017   Hypertension    Stroke ([place])    Wasp sting 08/04/2016   of dorsum of left hand near thumb    Current Outpatient Medications on File Prior to Visit  Medication Sig Dispense Refill   ACCU-CHEK GUIDE test strip USE 1 TEST STRIP UP TO 4 TIMES DAILY AS DIRECTED     Accu-Chek Softclix Lancets lancets SMARTSIG:1 Topical 4 Times Daily     amLODipine (NORVASC) 10 MG tablet Take 1 tablet (10 mg total) by mouth daily. 30 tablet 1   aspirin EC 81 MG tablet Take 81 mg by mouth daily. Swallow whole.     blood glucose meter kit and supplies KIT Dispense based on patient and insurance preference. Use up to four times daily as directed. 1 each 0   carvedilol (COREG) 25 MG tablet Take 1 tablet (25 mg total) by mouth 2 (two) times daily with a meal. 60 tablet 0   hydrALAZINE (APRESOLINE) 100 MG tablet Take 100 mg by mouth 3 (three) times daily.     insulin aspart protamine- aspart (NOVOLOG MIX 70/30) (70-30) 100 UNIT/ML injection Inject 0.05 mLs (5 Units total) into the skin 2  (two) times daily with a meal. 3 mL 0   minoxidil (LONITEN) 10 MG tablet Take 1 tablet (10 mg total) by mouth 2 (two) times daily. 60 tablet 11   Vitamin D, Ergocalciferol, (DRISDOL) 1.25 MG (50000 UNIT) CAPS capsule Take 50,000 Units by mouth every 7 (seven) days.     No current facility-administered medications on file prior to visit.    Allergies  Allergen Reactions   Zithromax [Azithromycin Dihydrate] Anaphylaxis   Kiwi Extract Other (See Comments)    Tongue goes numb     Assessment/Plan:  1. Hypertension -

## 2020-10-10 NOTE — Progress Notes (Deleted)
Cardiology Office Note:    Date:  10/10/2020   ID:  Harold Clements, DOB 1980/09/11, MRN [id number]  PCP:  Kerin Perna, NP  Cardiologist:  Sanda Klein, MD  Electrophysiologist:  None   Referring MD: Kerin Perna, NP   Chief Complaint: hospital follow-up for hypertensive emergency and acute on chronic CHF  History of Present Illness:    Harold Clements is a 40 y.o. male with a history of abnormal Myoview on 10/03/2020 suggestive of prior infarct but no ischemia, chronic CHF with mildly reduced EF of 40-45%, uncontrolled HTN, type 2 diabetes mellitus, CKD stage IV, CVA in the setting of hypertensive emergency, sleep apnea on CPAP, and history of medication noncompliance who is followed by Dr. Sallyanne Kuster and by Dr. Oval Linsey in her Hypertension Clinic and presents today for hospital follow-up of hypertensive emergency and acute on chronic CHF.  Patient was recently admitted from 09/27/2020 to 10/02/2020 for hypertensive emergency and acute pulmonary edema. O2 sats initially in the 40s when and patient required BiPAP. Patient has a longstanding history of medications noncompliance and reported not taking his medications for a few days prior to this event due to cost. I personally called the Pharmacy and it looks like patient had current prescriptions of all his medications except for Amlodipine which had not been filled since in 10/2019. Patient was restarted on home medications and was diuresed on IV Lasix but had worsening renal function so this was stopped. Echo showed LVEF of 40-45% with global hypokinesis, severe eccentric LVH of the posterior lateral segment and otherwise moderate LVH. RV normal in size and function. Also showed a small pericardial effusion. High-sensitivity peaked at 176. Patient underwent Lexiscan Myoview which showed moderate fixed defect in the mid to basal inferior wall suggestive of infarct but no reversible defects. BP greatly improved with after restarting medications.  He was discharged on Amlodipine '10mg'$  daily, Coreg '25mg'$  twice daily, Hydralazine '100mg'$  three times daily, and Minoxidil '10mg'$  twice daily. Home Valsartan and Spironolactone were stopped due to renal function. Creatinine 3.28 on discharge. Patient was instructed to follow-up with Nephrology as an outpatient. Repeat Echo recommended in a couple of weeks given potential of Minoxidil to cause pericardial effusion.  He was seen in our [facility] Hypertension Clinic on 10/18/2020 at which time his BP was 222/150 at which time he reported taking 2-3 medications each morning and 3-4 at night; however, it was not clear which ones he was taking or how compliant he was. He was given a 7 day pill minder as well as $5 [employer] gift card to cover any prescription costs.   Patient presents today for follow-up. ***  Hypertension - Patient recently admitted for hypertensive emergency with acute pulmonary edema. Blew history of difficult to control hypertension and medication non-compliance.  - *** - Continue current medications: Amlodipine '10mg'$  daily, Coreg '25mg'$  twice daily, Hydralazine '100mg'$  three times daily, and Minoxidil '10mg'$  twice daily.  Chronic CHF with Mildly Reduced EF - Echo during recent admission showed LVEF of 40-45% with global hypokinesis, severe eccentric LVH of the posterior lateral segment and otherwise moderate LVH. RV normal in size and function. Myoview suggestive of prior infarct but no evidence of ischemia.  - Appears euvolemic on exam. *** - Diuretic *** - Continue Coreg and Hydralazine as above. - No longer on Valsartan or Spironolactone due to renal function. - Continue to monitor daily weights, strict I/Os, and renal function. - Discussed importance of daily weights and sodium/fluid restrictions.   Pericardial  Effusion - Mild pericardial effusion localized near the right atrium and posterior to the left ventricle noted on Echo during recent admission.  - Will repeat Echo given Minoxidil can  cause pericardial effusions.    Type 2 Diabetes Mellitus - Hemoglobin A1c 7.1 on 10/18/2020. - Management per PCP.   Possible Thrombocytopenia - Platelets 35,000 on 10/18/2020. Suspect this is not accurate given platelets were in the 270,000s to 290,000s a few days prior to this. - Will recheck CBC today.   CKD Stage IV:  - Creatinine 3.28 on recent discharge. *** - Followed by Nephrology.   Obstructive Sleep Apnea - Patient reports compliance with CPAP. Continue.  Past Medical History:  Diagnosis Date   CKD (chronic kidney disease) stage 2, GFR 60-89 ml/min    Diabetes mellitus without complication ([name])    Ear pain 07/2017   Hypertension    Stroke ([place])    Wasp sting 08/04/2016   of dorsum of left hand near thumb    Past Surgical History:  Procedure Laterality Date   BUBBLE STUDY  02/02/2019   Procedure: BUBBLE STUDY;  Surgeon: Lelon Perla, MD;  Location: [facility];  Service: Cardiovascular;;   TEE WITHOUT CARDIOVERSION N/A 02/02/2019   Procedure: TRANSESOPHAGEAL ECHOCARDIOGRAM (TEE);  Surgeon: Lelon Perla, MD;  Location[facility] ENDOSCOPY;  Service: Cardiovascular;  Laterality: N/A;    Current Medications: No outpatient medications have been marked as taking for the 10/24/20 encounter (Appointment) with Darreld Mclean, PA-C.     Allergies:   Zithromax [azithromycin dihydrate] and Kiwi extract   Social History   Socioeconomic History   Marital status: Married    Spouse name: Not on file   Number of children: Not on file   Years of education: Not on file   Highest education level: Not on file  Occupational History   Occupation: Engineer, agricultural  Tobacco Use   Smoking status: Former    Types: Cigars    Quit date: 2010    Years since quitting: 12.6   Smokeless tobacco: Never  Vaping Use   Vaping Use: Never used  Substance and Sexual Activity   Alcohol use: No   Drug use: No   Sexual activity: Yes  Other Topics Concern   Not on file  Social  History Narrative   Not on file   Social Determinants of Health   Financial Resource Strain: Not on file  Food Insecurity: Not on file  Transportation Needs: Not on file  Physical Activity: Not on file  Stress: Not on file  Social Connections: Not on file     Family History: The patient's family history includes Diabetes in his mother; Heart attack in his mother; Heart disease in his brother and mother; Hypertension in his father and mother.  ROS:   Please see the history of present illness.     EKGs/Labs/Other Studies Reviewed:    The following studies were reviewed today:  Echocardiogram 09/28/2020: Impressions:  1. Left ventricular ejection fraction, by estimation, is 40 to 45%. Left  ventricular ejection fraction by 3D volume is 41 %. The left ventricle has  mildly decreased function. The left ventricle demonstrates global  hypokinesis. There is severe eccentric  left ventricular hypertrophy of the posterior-lateral segment and  otherwise moderate LVH noted. Indeterminate diastolic filling due to E-A  fusion. The average left ventricular global longitudinal strain is -7.2 %.  The global longitudinal strain is  abnormal, no definite evidence of preserved apical strain though apex has  slightly better values than remainder of myocardium.   2. Right ventricular systolic function is normal. The right ventricular  size is normal. Tricuspid regurgitation signal is inadequate for assessing  PA pressure.   3. A small pericardial effusion is present. The pericardial effusion is  localized near the right atrium and posterior to the left ventricle. There  is no evidence of cardiac tamponade.   4. The mitral valve is normal in structure. Trivial mitral valve  regurgitation. No evidence of mitral stenosis.   5. The aortic valve is grossly normal. Aortic valve regurgitation is  trivial. No aortic stenosis is present.   6. The inferior vena cava is dilated in size with >50%  respiratory  variability, suggesting right atrial pressure of 8 mmHg.   Comparison(s): A prior study was performed on 01/11/2019 and 02/02/2019. Prior images reviewed side by side. LVEF has decreased slightly on side by side comparison.  _______________  Carlton Adam Myoview 10/03/2020: Impressions: 1. Moderate-sized region of mildly decreased uptake in the mid to basal inferior wall during stress and rest suggestive of infarct. No reversible defects identified. 2. Global hypokinesis. 3. Left ventricular ejection fraction 33% 4. Non invasive risk stratification*: High  EKG:  EKG not ordered today. EKG personally reviewed and demonstrates ***.  Recent Labs: 09/27/2020: B Natriuretic Peptide 457.4 09/28/2020: ALT 25 09/29/2020: Magnesium 2.0 10/03/2020: BUN 41; Creatinine, Ser 3.28; Hemoglobin 13.6; Platelets 286; Potassium 3.5; Sodium 136  Recent Lipid Panel    Component Value Date/Time   CHOL 134 01/12/2019 0339   TRIG 118 01/12/2019 0339   HDL 32 (L) 01/12/2019 0339   CHOLHDL 4.2 01/12/2019 0339   VLDL 24 01/12/2019 0339   LDLCALC 78 01/12/2019 0339    Physical Exam:    Vital Signs: There were no vitals taken for this visit.    Wt Readings from Last 3 Encounters:  10/03/20 259 lb 8 oz (117.7 kg)  09/04/20 262 lb 6.4 oz (119 kg)  12/13/19 271 lb 12.8 oz (123.3 kg)     General: 40 y.o. male in no acute distress. HEENT: Normocephalic and atraumatic. Sclera clear. EOMs intact. Neck: Supple. No carotid bruits. No JVD. Heart: *** RRR. Distinct S1 and S2. No murmurs, gallops, or rubs. Radial and distal pedal pulses 2+ and equal bilaterally. Lungs: No increased work of breathing. Clear to ausculation bilaterally. No wheezes, rhonchi, or rales.  Abdomen: Soft, non-distended, and non-tender to palpation. Bowel sounds present in all 4 quadrants.  MSK: Normal strength and tone for age. *** Extremities: No lower extremity edema.    Skin: Warm and dry. Neuro: Alert and oriented x3. No  focal deficits. Psych: Normal affect. Responds appropriately.   Assessment:    No diagnosis found.  Plan:     Disposition: Follow up in ***   Medication Adjustments/Labs and Tests Ordered: Current medicines are reviewed at length with the patient today.  Concerns regarding medicines are outlined above.  No orders of the defined types were placed in this encounter.  No orders of the defined types were placed in this encounter.   There are no Patient Instructions on file for this visit.   Signed, Darreld Mclean, PA-C  10/10/2020 5:42 PM    [facility]

## 2020-10-11 DIAGNOSIS — G4733 Obstructive sleep apnea (adult) (pediatric): Secondary | ICD-10-CM | POA: Diagnosis not present

## 2020-10-18 ENCOUNTER — Other Ambulatory Visit: Payer: Self-pay

## 2020-10-18 ENCOUNTER — Ambulatory Visit (INDEPENDENT_AMBULATORY_CARE_PROVIDER_SITE_OTHER): Payer: Medicaid Other | Admitting: Pharmacist Clinician (PhC)/ Clinical Pharmacy Specialist

## 2020-10-18 VITALS — BP 222/150 | HR 89 | Resp 16 | Ht 69.0 in | Wt 259.0 lb

## 2020-10-18 DIAGNOSIS — I10 Essential (primary) hypertension: Secondary | ICD-10-CM | POA: Diagnosis not present

## 2020-10-18 DIAGNOSIS — I110 Hypertensive heart disease with heart failure: Secondary | ICD-10-CM | POA: Diagnosis not present

## 2020-10-18 DIAGNOSIS — E1169 Type 2 diabetes mellitus with other specified complication: Secondary | ICD-10-CM | POA: Diagnosis not present

## 2020-10-18 DIAGNOSIS — E669 Obesity, unspecified: Secondary | ICD-10-CM | POA: Diagnosis not present

## 2020-10-18 NOTE — Patient Instructions (Addendum)
Return for a a follow up appointment with PA next week.  Go to the lab today   Check your blood pressure at home daily and keep record of the readings.  Take your BP meds as follows:  Take extra tablet of minoxidil today when you get home.     Medicine Morning Mid-day Evening  Amlodipine 10 mg   X  Carvedilol 25 mg  X  X  Hydralazine 100 mg  X X X  Aspirin 81 mg X    Minoxidil 10 mg  X  X  Vitamin D (once weekly on Saturdays) X      Bring all of your meds, your BP cuff and your record of home blood pressures to your next appointment.  Exercise as you're able, try to walk approximately 30 minutes per day.  Keep salt intake to a minimum, especially watch canned and prepared boxed foods.  Eat more fresh fruits and vegetables and fewer canned items.  Avoid eating in fast food restaurants.    HOW TO TAKE YOUR BLOOD PRESSURE: Rest 5 minutes before taking your blood pressure.  Don't smoke or drink caffeinated beverages for at least 30 minutes before. Take your blood pressure before (not after) you eat. Sit comfortably with your back supported and both feet on the floor (don't cross your legs). Elevate your arm to heart level on a table or a desk. Use the proper sized cuff. It should fit smoothly and snugly around your bare upper arm. There should be enough room to slip a fingertip under the cuff. The bottom edge of the cuff should be 1 inch above the crease of the elbow. Ideally, take 3 measurements at one sitting and record the average.

## 2020-10-18 NOTE — Progress Notes (Signed)
Patient ID: Harold Clements                 DOB: [birth year]                      MRN: [id number]     HPI: Harold Clements is a 40 y.o. male referred by Dr. Oval Linsey to HTN clinic. PMH includes hx of chronic heart failure, (EF at 33%), hypertension, CKD 3 (GFR 24), diabetes (A1c 7.6) , OSA (states now compliant with CPAP), stroke, and medication noncompliance.  He was seen by Dr. Oval Linsey last August, who noted that there were concerns with his picking up medications in a timely manner.  At his first PharmD follow up, his pressure was still elevated at 200/130 and patient admitted to some memory issues due to prior stroke, but was trying to take medications regularly.  He was followed for 2 visits with CVRR before being lost to follow up.  He was having issues with paying for medications.  All prescriptions were sent to [facility] and he was given $25 gift card to cover costs.  He was not seen again until an appointment with Dr. Sallyanne Kuster in July.  Again he was without medications and pressure in office was 215/125.  He was re-started on minoxidil and carvedilol.  At the end of the month he was in the ED for acute hypoxic respiratory failure due to hypertensive emergency/pulmonary edema.    Today he returns for follow up.  States he is taking 2-3 medications each morning and 3-4 at night.  Not sure which ones or how compliant.  Since I saw him last year he as welcomed a new baby girl.    Current HTN meds: [facility] Amlodipine 60m daily  Carvedilol 269mtwice daily Hydralazine 10028mID Minoxidil 10m55mice daily  Previously tried: spironolactone, valsartan  BP goal: 130/80 (<140/90 if unable to tolerate)  Family History: The patient's family history includes Diabetes in his mother; Heart attack in his mother; Heart disease in his brother and mother; Hypertension in his father and mother. Stroke in father  but he still alive. Kids 10,8,1  Social History: former smoker, never used smokeless  tobacco, and denies alcohol use, drinking sprite zero, no coffee  Diet: avoid adding salt to diet and mainly homecooked meals, tries to avoid prepared/boxed meals.  Exercise: activities of daily living  Home BP readings: none provided  Labs:  10/03/20  Na 136, K 3.5, Glu 124, BUN 41, SCr 3.28, GFR 24  Wt Readings from Last 3 Encounters:  10/03/20 259 lb 8 oz (117.7 kg)  09/04/20 262 lb 6.4 oz (119 kg)  12/13/19 271 lb 12.8 oz (123.3 kg)   BP Readings from Last 3 Encounters:  10/03/20 137/80  09/04/20 (!) 215/125  04/08/20 (!) 238/150   Pulse Readings from Last 3 Encounters:  10/03/20 93  09/04/20 94  04/08/20 99    Renal function: CrCl cannot be calculated (Unknown ideal weight.).  Past Medical History:  Diagnosis Date   CKD (chronic kidney disease) stage 2, GFR 60-89 ml/min    Diabetes mellitus without complication (HCC)[place] Ear pain 07/2017   Hypertension    Stroke (HCC)[place] Wasp sting 08/04/2016   of dorsum of left hand near thumb    Current Outpatient Medications on File Prior to Visit  Medication Sig Dispense Refill   ACCU-CHEK GUIDE test strip USE 1 TEST STRIP UP TO 4 TIMES DAILY AS  DIRECTED     Accu-Chek Softclix Lancets lancets SMARTSIG:1 Topical 4 Times Daily     amLODipine (NORVASC) 10 MG tablet Take 1 tablet (10 mg total) by mouth daily. 30 tablet 1   aspirin EC 81 MG tablet Take 81 mg by mouth daily. Swallow whole.     blood glucose meter kit and supplies KIT Dispense based on patient and insurance preference. Use up to four times daily as directed. 1 each 0   carvedilol (COREG) 25 MG tablet Take 1 tablet (25 mg total) by mouth 2 (two) times daily with a meal. 60 tablet 0   hydrALAZINE (APRESOLINE) 100 MG tablet Take 100 mg by mouth 3 (three) times daily.     insulin aspart protamine- aspart (NOVOLOG MIX 70/30) (70-30) 100 UNIT/ML injection Inject 0.05 mLs (5 Units total) into the skin 2 (two) times daily with a meal. 3 mL 0   minoxidil (LONITEN) 10 MG tablet  Take 1 tablet (10 mg total) by mouth 2 (two) times daily. 60 tablet 11   Vitamin D, Ergocalciferol, (DRISDOL) 1.25 MG (50000 UNIT) CAPS capsule Take 50,000 Units by mouth every 7 (seven) days.     No current facility-administered medications on file prior to visit.    Allergies  Allergen Reactions   Zithromax [Azithromycin Dihydrate] Anaphylaxis   Kiwi Extract Other (See Comments)    Tongue goes numb    There were no vitals taken for this visit.  No problem-specific Assessment & Plan notes found for this encounter.  Medicine Morning Evening  Amlodipine 10 mg  X  Carvedilol 25 mg  X X  Hydralazine 100 mg  X X  Valsartan 40 mg X   Minoxidil 10 mg  X X  Spironolactone 25 mg X    ASSESSMENT/PLAN:  Patient with malignant hypertension, currently nowhere near goal.  Unsure of which medications he is taking or compliance with those.  He was given a 7 day pill minder (4 x/day) as well as $5 [facility] gift card to cover any prescription costs.  We had a frank discussion about the need to be taking medications correctly.  Explained stress test result showing EF at 33% and what this means for his heart.  Stressed that BP needs to be monitored closely and meds taken exactly, in order to prevent more permanent heart damage.  He is scheduled to see PA next week and we will see him 2 weeks after that.  He is to fill the 7 day pill minder and bring it, along with all his medication bottles and home BP cuff to appointment next week.  Because his cuff is too small for his upper arm, he was taught to use on forearm.  This will need to be checked for accuracy at his next few appointments as well.  Labs ordered: CMET, CBC, TSH, A1c, LIPID  Tommy Medal PharmD CPP [facility] [address] ,[place] [address] 10/18/2020 8:22 AM

## 2020-10-18 NOTE — Assessment & Plan Note (Signed)
Patient with malignant hypertension, currently nowhere near goal.  Unsure of which medications he is taking or compliance with those.  He was given a 7 day pill minder (4 x/day) as well as $5 [facility] gift card to cover any prescription costs.  We had a frank discussion about the need to be taking medications correctly.  Explained stress test result showing EF at 33% and what this means for his heart.  Stressed that BP needs to be monitored closely and meds taken exactly, in order to prevent more permanent heart damage.  He is scheduled to see PA next week and we will see him 2 weeks after that.  He is to fill the 7 day pill minder and bring it, along with all his medication bottles and home BP cuff to appointment next week.  Because his cuff is too small for his upper arm, he was taught to use on forearm.  This will need to be checked for accuracy at his next few appointments as well.  Labs ordered: CMET, CBC, TSH, A1c, LIPID

## 2020-10-19 LAB — COMPREHENSIVE METABOLIC PANEL
ALT: 12 IU/L (ref 0–44)
AST: 16 IU/L (ref 0–40)
Albumin/Globulin Ratio: 1.3 (ref 1.2–2.2)
Albumin: 4.4 g/dL (ref 4.0–5.0)
Alkaline Phosphatase: 80 IU/L (ref 44–121)
BUN/Creatinine Ratio: 8 — ABNORMAL LOW (ref 9–20)
BUN: 20 mg/dL (ref 6–20)
Bilirubin Total: 0.6 mg/dL (ref 0.0–1.2)
CO2: 21 mmol/L (ref 20–29)
Calcium: 8.5 mg/dL — ABNORMAL LOW (ref 8.7–10.2)
Chloride: 104 mmol/L (ref 96–106)
Creatinine, Ser: 2.36 mg/dL — ABNORMAL HIGH (ref 0.76–1.27)
Globulin, Total: 3.4 g/dL (ref 1.5–4.5)
Glucose: 83 mg/dL (ref 65–99)
Potassium: 5 mmol/L (ref 3.5–5.2)
Sodium: 141 mmol/L (ref 134–144)
Total Protein: 7.8 g/dL (ref 6.0–8.5)
eGFR: 35 mL/min/{1.73_m2} — ABNORMAL LOW (ref >59)

## 2020-10-19 LAB — TSH: TSH: 3.71 u[IU]/mL (ref 0.450–4.500)

## 2020-10-19 LAB — CBC
Hematocrit: 46 % (ref 37.5–51.0)
Hemoglobin: 15.5 g/dL (ref 13.0–17.7)
MCH: 26.6 pg (ref 26.6–33.0)
MCHC: 33.7 g/dL (ref 31.5–35.7)
MCV: 79 fL (ref 79–97)
Platelets: 35 10*3/uL — CL (ref 150–450)
RBC: 5.83 x10E6/uL — ABNORMAL HIGH (ref 4.14–5.80)
RDW: 14.5 % (ref 11.6–15.4)
WBC: 3.9 10*3/uL (ref 3.4–10.8)

## 2020-10-19 LAB — LIPID PANEL
Chol/HDL Ratio: 4.2 ratio (ref 0.0–5.0)
Cholesterol, Total: 161 mg/dL (ref 100–199)
HDL: 38 mg/dL — ABNORMAL LOW (ref >39)
LDL Chol Calc (NIH): 100 mg/dL — ABNORMAL HIGH (ref 0–99)
Triglycerides: 125 mg/dL (ref 0–149)
VLDL Cholesterol Cal: 23 mg/dL (ref 5–40)

## 2020-10-19 LAB — HEMOGLOBIN A1C
Est. average glucose Bld gHb Est-mCnc: 157 mg/dL
Hgb A1c MFr Bld: 7.1 % — ABNORMAL HIGH (ref 4.8–5.6)

## 2020-10-24 ENCOUNTER — Ambulatory Visit: Payer: Medicaid Other | Admitting: Student

## 2020-10-25 ENCOUNTER — Encounter (INDEPENDENT_AMBULATORY_CARE_PROVIDER_SITE_OTHER): Payer: Self-pay | Admitting: Primary Care

## 2020-10-28 ENCOUNTER — Other Ambulatory Visit: Payer: Self-pay | Admitting: *Deleted

## 2020-10-28 DIAGNOSIS — E669 Obesity, unspecified: Secondary | ICD-10-CM

## 2020-10-28 DIAGNOSIS — E1169 Type 2 diabetes mellitus with other specified complication: Secondary | ICD-10-CM

## 2020-10-28 DIAGNOSIS — I110 Hypertensive heart disease with heart failure: Secondary | ICD-10-CM

## 2020-10-28 DIAGNOSIS — I5032 Chronic diastolic (congestive) heart failure: Secondary | ICD-10-CM

## 2020-11-05 ENCOUNTER — Encounter: Payer: Self-pay | Admitting: *Deleted

## 2020-11-11 ENCOUNTER — Telehealth: Payer: Self-pay

## 2020-11-11 ENCOUNTER — Ambulatory Visit: Payer: Medicaid Other

## 2020-11-11 DIAGNOSIS — G4733 Obstructive sleep apnea (adult) (pediatric): Secondary | ICD-10-CM | POA: Diagnosis not present

## 2020-11-11 NOTE — Telephone Encounter (Signed)
Lmom to r/s

## 2020-11-11 NOTE — Progress Notes (Deleted)
Patient ID: Harold Clements                 DOB: 06/11/1980                      MRN: [id number]     HPI: Harold Clements is a 40 y.o. male referred by Dr. Oval Linsey to HTN clinic. PMH includes hx of chronic heart failure, (EF at 33%), hypertension, CKD 3 (GFR 24), diabetes (A1c 7.6) , OSA (states now compliant with CPAP), stroke, and medication noncompliance.  He was seen by Dr. Oval Linsey last August, who noted that there were concerns with his picking up medications in a timely manner.  At his first PharmD follow up, his pressure was still elevated at 200/130 and patient admitted to some memory issues due to prior stroke, but was trying to take medications regularly.  He was followed for 2 visits with CVRR before being lost to follow up.  He was having issues with paying for medications.  All prescriptions were sent to [facility] and he was given $25 gift card to cover costs.  He was not seen again until an appointment with Dr. Sallyanne Kuster in July.  Again he was without medications and pressure in office was 215/125.  He was re-started on minoxidil and carvedilol.  At the end of the month he was in the ED for acute hypoxic respiratory failure due to hypertensive emergency/pulmonary edema.    Today he returns for follow up.  States he is taking 2-3 medications each morning and 3-4 at night.  Not sure which ones or how compliant.  Since I saw him last year he as welcomed a new baby girl.    Current HTN meds: [facility] Amlodipine 22m daily  Carvedilol 240mtwice daily Hydralazine 10031mID Minoxidil 37m73mice daily  Previously tried: spironolactone, valsartan  BP goal: 130/80 (<140/90 if unable to tolerate)  Family History: The patient's family history includes Diabetes in his mother; Heart attack in his mother; Heart disease in his brother and mother; Hypertension in his father and mother. Stroke in father  but he still alive. Kids 10,8,1  Social History: former smoker, never used smokeless  tobacco, and denies alcohol use, drinking sprite zero, no coffee  Diet: avoid adding salt to diet and mainly homecooked meals, tries to avoid prepared/boxed meals.  Exercise: activities of daily living  Home BP readings: none provided  Labs:  10/03/20  Na 136, K 3.5, Glu 124, BUN 41, SCr 3.28, GFR 24  Wt Readings from Last 3 Encounters:  10/18/20 259 lb (117.5 kg)  10/03/20 259 lb 8 oz (117.7 kg)  09/04/20 262 lb 6.4 oz (119 kg)   BP Readings from Last 3 Encounters:  10/18/20 (!) 222/150  10/03/20 137/80  09/04/20 (!) 215/125   Pulse Readings from Last 3 Encounters:  10/18/20 89  10/03/20 93  09/04/20 94    Renal function: CrCl cannot be calculated (Patient's most recent lab result is older than the maximum 21 days allowed.).  Past Medical History:  Diagnosis Date   CKD (chronic kidney disease) stage 2, GFR 60-89 ml/min    Diabetes mellitus without complication (HCC)[place] Ear pain 07/2017   Hypertension    [facility]   of dorsum of left hand near thumb    Current Outpatient Medications on File Prior to Visit  Medication Sig Dispense Refill   ACCU-CHEK GUIDE test strip USE 1  TEST STRIP UP TO 4 TIMES DAILY AS DIRECTED     Accu-Chek Softclix Lancets lancets SMARTSIG:1 Topical 4 Times Daily     amLODipine (NORVASC) 10 MG tablet Take 1 tablet (10 mg total) by mouth daily. 30 tablet 1   aspirin EC 81 MG tablet Take 81 mg by mouth daily. Swallow whole.     blood glucose meter kit and supplies KIT Dispense based on patient and insurance preference. Use up to four times daily as directed. 1 each 0   carvedilol (COREG) 25 MG tablet Take 1 tablet (25 mg total) by mouth 2 (two) times daily with a meal. (Patient not taking: Reported on 10/18/2020) 60 tablet 0   hydrALAZINE (APRESOLINE) 100 MG tablet Take 100 mg by mouth 3 (three) times daily.     insulin aspart protamine- aspart (NOVOLOG MIX 70/30) (70-30) 100 UNIT/ML injection Inject 0.05 mLs (5 Units total)  into the skin 2 (two) times daily with a meal. 3 mL 0   minoxidil (LONITEN) 10 MG tablet Take 1 tablet (10 mg total) by mouth 2 (two) times daily. 60 tablet 11   Vitamin D, Ergocalciferol, (DRISDOL) 1.25 MG (50000 UNIT) CAPS capsule Take 50,000 Units by mouth every 7 (seven) days. (Patient not taking: Reported on 10/18/2020)     No current facility-administered medications on file prior to visit.    Allergies  Allergen Reactions   Zithromax [Azithromycin Dihydrate] Anaphylaxis   Kiwi Extract Other (See Comments)    Tongue goes numb    There were no vitals taken for this visit.  No problem-specific Assessment & Plan notes found for this encounter.  Medicine Morning Evening  Amlodipine 10 mg  X  Carvedilol 25 mg  X X  Hydralazine 100 mg  X X  Valsartan 40 mg X   Minoxidil 10 mg  X X  Spironolactone 25 mg X    ASSESSMENT/PLAN:  Patient with malignant hypertension, currently nowhere near goal.  Unsure of which medications he is taking or compliance with those.  He was given a 7 day pill minder (4 x/day) as well as $5 [facility] gift card to cover any prescription costs.  We had a frank discussion about the need to be taking medications correctly.  Explained stress test result showing EF at 33% and what this means for his heart.  Stressed that BP needs to be monitored closely and meds taken exactly, in order to prevent more permanent heart damage.  He is scheduled to see PA next week and we will see him 2 weeks after that.  He is to fill the 7 day pill minder and bring it, along with all his medication bottles and home BP cuff to appointment next week.  Because his cuff is too small for his upper arm, he was taught to use on forearm.  This will need to be checked for accuracy at his next few appointments as well.  Labs ordered: CMET, CBC, TSH, A1c, LIPID  Tommy Medal PharmD CPP [facility] [address] ,[place] [address] 11/11/2020 8:18 AM

## 2020-11-28 ENCOUNTER — Ambulatory Visit (INDEPENDENT_AMBULATORY_CARE_PROVIDER_SITE_OTHER): Payer: Medicaid Other | Admitting: Pharmacist Clinician (PhC)/ Clinical Pharmacy Specialist

## 2020-11-28 ENCOUNTER — Other Ambulatory Visit: Payer: Self-pay

## 2020-11-28 DIAGNOSIS — I110 Hypertensive heart disease with heart failure: Secondary | ICD-10-CM

## 2020-11-28 NOTE — Progress Notes (Signed)
Patient ID: [name]                 DOB: Mar 01, 1981                      MRN: [id number]     HPI: Harold Clements is a 40 y.o. male referred by Dr. Oval Clements to HTN clinic. PMH includes hx of chronic heart failure, (EF at 33%), hypertension, CKD 3 (GFR 24), diabetes (A1c 7.6) , OSA (states now compliant with CPAP), stroke, and medication noncompliance.  He was seen by Dr. Oval Clements last August, who noted that there were concerns with his picking up medications in a timely manner.  At his first PharmD follow up, his pressure was still elevated at 200/130 and patient admitted to some memory issues due to prior stroke, but was trying to take medications regularly.  He was followed for 2 visits with CVRR before being lost to follow up.  He was having issues with paying for medications.  All prescriptions were sent to [facility] and he was given $25 gift card to cover costs.  He was not seen again until an appointment with Dr. Sallyanne Clements in July.  Again he was without medications and pressure in office was 215/125.  He was re-started on minoxidil and carvedilol.  At the end of the month he was in the ED for acute hypoxic respiratory failure due to hypertensive emergency/pulmonary edema.    Today he returns for follow up.  States he is taking 2-3 medications each morning and 3-4 at night.  Not sure which ones or how compliant.  Since I saw him last year he as welcomed a new baby girl.   No problems with meds today, had covid few weeks ago   Current HTN meds: [facility] Amlodipine 18m daily  Carvedilol 214mtwice daily Hydralazine 10050mID Minoxidil 43m26mice daily  Previously tried: spironolactone, valsartan  BP goal: 130/80 (<140/90 if unable to tolerate)  Family History: The patient's family history includes Diabetes in his mother; Heart attack in his mother; Heart disease in his brother and mother; Hypertension in his father and mother. Stroke in father  but he still alive. Kids  10,8,1  Social History: former smoker, never used smokeless tobacco, and denies alcohol use, drinking sprite zero, no coffee  Diet: avoid adding salt to diet and mainly homecooked meals, tries to avoid prepared/boxed meals.  Exercise: activities of daily living  Home BP readings: none provided  150-170/90's at home  Labs:  10/03/20  Na 136, K 3.5, Glu 124, BUN 41, SCr 3.28, GFR 24  Wt Readings from Last 3 Encounters:  11/28/20 259 lb (117.5 kg)  10/18/20 259 lb (117.5 kg)  10/03/20 259 lb 8 oz (117.7 kg)   BP Readings from Last 3 Encounters:  11/28/20 (!) 150/98  10/18/20 (!) 222/150  10/03/20 137/80   Pulse Readings from Last 3 Encounters:  11/28/20 92  10/18/20 89  10/03/20 93    Renal function: CrCl cannot be calculated (Patient's most recent lab result is older than the maximum 21 days allowed.).  Past Medical History:  Diagnosis Date   CKD (chronic kidney disease) stage 2, GFR 60-89 ml/min    Diabetes mellitus without complication (HCC)[place] Ear pain 07/2017   Hypertension    [facility]   of dorsum of left hand near thumb    Current Outpatient Medications on File Prior to Visit  Medication Sig Dispense Refill   ACCU-CHEK GUIDE test strip USE 1 TEST STRIP UP TO 4 TIMES DAILY AS DIRECTED     Accu-Chek Softclix Lancets lancets SMARTSIG:1 Topical 4 Times Daily     amLODipine (NORVASC) 10 MG tablet Take 1 tablet (10 mg total) by mouth daily. 30 tablet 1   aspirin EC 81 MG tablet Take 81 mg by mouth daily. Swallow whole.     blood glucose meter kit and supplies KIT Dispense based on patient and insurance preference. Use up to four times daily as directed. 1 each 0   carvedilol (COREG) 25 MG tablet Take 1 tablet (25 mg total) by mouth 2 (two) times daily with a meal. 60 tablet 0   hydrALAZINE (APRESOLINE) 100 MG tablet Take 100 mg by mouth 3 (three) times daily.     insulin aspart protamine- aspart (NOVOLOG MIX 70/30) (70-30) 100 UNIT/ML  injection Inject 0.05 mLs (5 Units total) into the skin 2 (two) times daily with a meal. 3 mL 0   minoxidil (LONITEN) 10 MG tablet Take 1 tablet (10 mg total) by mouth 2 (two) times daily. 60 tablet 11   Vitamin D, Ergocalciferol, (DRISDOL) 1.25 MG (50000 UNIT) CAPS capsule Take 50,000 Units by mouth every 7 (seven) days.     No current facility-administered medications on file prior to visit.    Allergies  Allergen Reactions   Zithromax [Azithromycin Dihydrate] Anaphylaxis   Kiwi Extract Other (See Comments)    Tongue goes numb    Blood pressure (!) 150/98, pulse 92, resp. rate 16, height '5\' 9"'  (1.753 m), weight 259 lb (117.5 kg), SpO2 96 %.  Hypertensive heart disease Patient with resistant hypertension, has now been on 4 medications consistently for several weeks.  BP dropped 72/52 points since last visit when he was given 7 day pill minder and strong warnings about his health should he not make some changes.  Praised his compliance with medications and encouraged him to continue with this.  Limited on medications we can use, as his GFR is about 24.   Can consider adding doxazosin, but will hold off for now and instead encourage lifestyle modifications.  Discussed ways to exercise at length.  He was encouraged to take toddler for walks regularly as well as walk around the field while son at football practice.  Explained in detail that our medication options are limited because of his kidney function, so to reach goals will have to work on lifestyle modifications.  Gave him Hypertension booklet and highlighted areas he should focus on. Will see him back in 1 month for follow up.    Medicine Morning Lunch Dinner Evening  Amlodipine 10 mg   X   Carvedilol 25 mg  X   X  Hydralazine 100 mg  X X  X  Minoxidil 10 mg  X   X     Harold Clements PharmD CPP [facility] [place] [id number] 11/28/2020 8:54 AM

## 2020-11-28 NOTE — Patient Instructions (Signed)
Return for a a follow up appointment October 27 at 10 am  Check your blood pressure at home daily and keep record of the readings.  Take your BP meds as follows:  Continue with current medications   Work on increasing your exercise.  Walk as many days as you're able.  (At football practice, with the baby in the afternoons, etc..)   Bring all of your meds, your BP cuff and your record of home blood pressures to your next appointment.  Exercise as you're able, try to walk approximately 30 minutes per day.  Keep salt intake to a minimum, especially watch canned and prepared boxed foods.  Eat more fresh fruits and vegetables and fewer canned items.  Avoid eating in fast food restaurants.    HOW TO TAKE YOUR BLOOD PRESSURE: Rest 5 minutes before taking your blood pressure.  Don't smoke or drink caffeinated beverages for at least 30 minutes before. Take your blood pressure before (not after) you eat. Sit comfortably with your back supported and both feet on the floor (don't cross your legs). Elevate your arm to heart level on a table or a desk. Use the proper sized cuff. It should fit smoothly and snugly around your bare upper arm. There should be enough room to slip a fingertip under the cuff. The bottom edge of the cuff should be 1 inch above the crease of the elbow. Ideally, take 3 measurements at one sitting and record the average

## 2020-11-28 NOTE — Assessment & Plan Note (Signed)
Patient with resistant hypertension, has now been on 4 medications consistently for several weeks.  BP dropped 72/52 points since last visit when he was given 7 day pill minder and strong warnings about his health should he not make some changes.  Praised his compliance with medications and encouraged him to continue with this.  Limited on medications we can use, as his GFR is about 24.   Can consider adding doxazosin, but will hold off for now and instead encourage lifestyle modifications.  Discussed ways to exercise at length.  He was encouraged to take toddler for walks regularly as well as walk around the field while son at football practice.  Explained in detail that our medication options are limited because of his kidney function, so to reach goals will have to work on lifestyle modifications.  Gave him Hypertension booklet and highlighted areas he should focus on. Will see him back in 1 month for follow up.

## 2020-12-11 DIAGNOSIS — G4733 Obstructive sleep apnea (adult) (pediatric): Secondary | ICD-10-CM | POA: Diagnosis not present

## 2020-12-26 ENCOUNTER — Ambulatory Visit: Payer: Medicaid Other

## 2020-12-26 ENCOUNTER — Emergency Department (HOSPITAL_COMMUNITY)
Admission: EM | Admit: 2020-12-26 | Discharge: 2020-12-26 | Disposition: A | Discharge status: Home / Self Care | Payer: Medicaid Other | Attending: Emergency Medicine | Admitting: Emergency Medicine

## 2020-12-26 ENCOUNTER — Emergency Department (HOSPITAL_COMMUNITY): Payer: Medicaid Other

## 2020-12-26 ENCOUNTER — Telehealth: Payer: Self-pay

## 2020-12-26 ENCOUNTER — Encounter (HOSPITAL_COMMUNITY): Payer: Self-pay | Admitting: Emergency Medicine

## 2020-12-26 DIAGNOSIS — Z79899 Other long term (current) drug therapy: Secondary | ICD-10-CM | POA: Diagnosis not present

## 2020-12-26 DIAGNOSIS — Z7982 Long term (current) use of aspirin: Secondary | ICD-10-CM | POA: Diagnosis not present

## 2020-12-26 DIAGNOSIS — R531 Weakness: Secondary | ICD-10-CM | POA: Diagnosis not present

## 2020-12-26 DIAGNOSIS — Z794 Long term (current) use of insulin: Secondary | ICD-10-CM | POA: Insufficient documentation

## 2020-12-26 DIAGNOSIS — E1122 Type 2 diabetes mellitus with diabetic chronic kidney disease: Secondary | ICD-10-CM | POA: Diagnosis not present

## 2020-12-26 DIAGNOSIS — N182 Chronic kidney disease, stage 2 (mild): Secondary | ICD-10-CM | POA: Diagnosis not present

## 2020-12-26 DIAGNOSIS — Z87891 Personal history of nicotine dependence: Secondary | ICD-10-CM | POA: Diagnosis not present

## 2020-12-26 DIAGNOSIS — I129 Hypertensive chronic kidney disease with stage 1 through stage 4 chronic kidney disease, or unspecified chronic kidney disease: Secondary | ICD-10-CM | POA: Insufficient documentation

## 2020-12-26 DIAGNOSIS — R4182 Altered mental status, unspecified: Secondary | ICD-10-CM | POA: Diagnosis not present

## 2020-12-26 DIAGNOSIS — I6381 Other cerebral infarction due to occlusion or stenosis of small artery: Secondary | ICD-10-CM | POA: Diagnosis not present

## 2020-12-26 DIAGNOSIS — R079 Chest pain, unspecified: Secondary | ICD-10-CM | POA: Diagnosis not present

## 2020-12-26 DIAGNOSIS — I10 Essential (primary) hypertension: Secondary | ICD-10-CM | POA: Diagnosis not present

## 2020-12-26 DIAGNOSIS — J341 Cyst and mucocele of nose and nasal sinus: Secondary | ICD-10-CM | POA: Diagnosis not present

## 2020-12-26 DIAGNOSIS — J329 Chronic sinusitis, unspecified: Secondary | ICD-10-CM | POA: Diagnosis not present

## 2020-12-26 DIAGNOSIS — R0602 Shortness of breath: Secondary | ICD-10-CM | POA: Diagnosis not present

## 2020-12-26 LAB — URINALYSIS, ROUTINE W REFLEX MICROSCOPIC
Bacteria, UA: NONE SEEN
Bilirubin Urine: NEGATIVE
Glucose, UA: 50 mg/dL — AB
Hgb urine dipstick: NEGATIVE
Ketones, ur: NEGATIVE mg/dL
Leukocytes,Ua: NEGATIVE
Nitrite: NEGATIVE
Protein, ur: 100 mg/dL — AB
Specific Gravity, Urine: 1.015 (ref 1.005–1.030)
pH: 5 (ref 5.0–8.0)

## 2020-12-26 LAB — CBC WITH DIFFERENTIAL/PLATELET
Abs Immature Granulocytes: 0.01 10*3/uL (ref 0.00–0.07)
Basophils Absolute: 0 10*3/uL (ref 0.0–0.1)
Basophils Relative: 1 %
Eosinophils Absolute: 0.1 10*3/uL (ref 0.0–0.5)
Eosinophils Relative: 2 %
HCT: 46.3 % (ref 39.0–52.0)
Hemoglobin: 15.2 g/dL (ref 13.0–17.0)
Immature Granulocytes: 0 %
Lymphocytes Relative: 17 %
Lymphs Abs: 1 10*3/uL (ref 0.7–4.0)
MCH: 26.2 pg (ref 26.0–34.0)
MCHC: 32.8 g/dL (ref 30.0–36.0)
MCV: 79.7 fL — ABNORMAL LOW (ref 80.0–100.0)
Monocytes Absolute: 0.4 10*3/uL (ref 0.1–1.0)
Monocytes Relative: 7 %
Neutro Abs: 4.4 10*3/uL (ref 1.7–7.7)
Neutrophils Relative %: 73 %
Platelets: 234 10*3/uL (ref 150–400)
RBC: 5.81 MIL/uL (ref 4.22–5.81)
RDW: 13.8 % (ref 11.5–15.5)
WBC: 6 10*3/uL (ref 4.0–10.5)
nRBC: 0 % (ref 0.0–0.2)

## 2020-12-26 LAB — RAPID URINE DRUG SCREEN, HOSP PERFORMED
Amphetamines: NOT DETECTED
Barbiturates: NOT DETECTED
Benzodiazepines: NOT DETECTED
Cocaine: NOT DETECTED
Opiates: NOT DETECTED
Tetrahydrocannabinol: NOT DETECTED

## 2020-12-26 LAB — COMPREHENSIVE METABOLIC PANEL
ALT: 15 U/L (ref 0–44)
AST: 18 U/L (ref 15–41)
Albumin: 3.9 g/dL (ref 3.5–5.0)
Alkaline Phosphatase: 70 U/L (ref 38–126)
Anion gap: 9 (ref 5–15)
BUN: 30 mg/dL — ABNORMAL HIGH (ref 6–20)
CO2: 22 mmol/L (ref 22–32)
Calcium: 9.2 mg/dL (ref 8.9–10.3)
Chloride: 107 mmol/L (ref 98–111)
Creatinine, Ser: 3.33 mg/dL — ABNORMAL HIGH (ref 0.61–1.24)
GFR, Estimated: 23 mL/min — ABNORMAL LOW (ref >60)
Glucose, Bld: 170 mg/dL — ABNORMAL HIGH (ref 70–99)
Potassium: 3.9 mmol/L (ref 3.5–5.1)
Sodium: 138 mmol/L (ref 135–145)
Total Bilirubin: 1.1 mg/dL (ref 0.3–1.2)
Total Protein: 8 g/dL (ref 6.5–8.1)

## 2020-12-26 LAB — I-STAT VENOUS BLOOD GAS, ED
Acid-Base Excess: 1 mmol/L (ref 0.0–2.0)
Bicarbonate: 27.5 mmol/L (ref 20.0–28.0)
Calcium, Ion: 1.19 mmol/L (ref 1.15–1.40)
HCT: 45 % (ref 39.0–52.0)
Hemoglobin: 15.3 g/dL (ref 13.0–17.0)
O2 Saturation: 98 %
Potassium: 3.7 mmol/L (ref 3.5–5.1)
Sodium: 143 mmol/L (ref 135–145)
TCO2: 29 mmol/L (ref 22–32)
pCO2, Ven: 47.9 mmHg (ref 44.0–60.0)
pH, Ven: 7.366 (ref 7.250–7.430)
pO2, Ven: 106 mmHg — ABNORMAL HIGH (ref 32.0–45.0)

## 2020-12-26 LAB — TROPONIN I (HIGH SENSITIVITY)
Troponin I (High Sensitivity): 25 ng/L — ABNORMAL HIGH (ref <18)
Troponin I (High Sensitivity): 30 ng/L — ABNORMAL HIGH (ref <18)

## 2020-12-26 LAB — CBG MONITORING, ED: Glucose-Capillary: 184 mg/dL — ABNORMAL HIGH (ref 70–99)

## 2020-12-26 LAB — PROTIME-INR
INR: 1.1 (ref 0.8–1.2)
Prothrombin Time: 13.7 seconds (ref 11.4–15.2)

## 2020-12-26 MED ORDER — LACTATED RINGERS IV BOLUS
500.0000 mL | Freq: Once | INTRAVENOUS | Status: AC
  Administered 2020-12-26: 500 mL via INTRAVENOUS

## 2020-12-26 NOTE — ED Notes (Signed)
Unable to obtain venous access at this time. IV team consult placed.

## 2020-12-26 NOTE — ED Notes (Signed)
Patient instructed to ambulate for this technician. Pt was stable while pacing back and forth.

## 2020-12-26 NOTE — Telephone Encounter (Signed)
Lmom to r/s missed appt  

## 2020-12-26 NOTE — Telephone Encounter (Signed)
This encounter was created in error - please disregard.

## 2020-12-26 NOTE — ED Provider Notes (Signed)
Emergency Medicine Provider Triage Evaluation Note  Harold Clements , a 40 y.o. male  was evaluated in triage.  Pt complains of AMS.  Began yesterday morning.  He initially checked in for generalized weakness.  Family on phone states yesterday morning patient became confused which is consistent with his prior stroke.  History of hypertension, compliant with meds.  Patient has no complaints.   Review of Systems  Positive: AMS, weakness Negative: HA, syncope, numbness  Physical Exam  BP 109/77 (BP Location: Right Arm)   Pulse 70   Temp 98.5 F (36.9 C) (Oral)   Resp 20   SpO2 96%  Gen:   Awake, no distress   Resp:  Normal effort  MSK:   Moves extremities without difficulty  Neuro:  Cn 2-12 intact. No drift, Equal strength, ambulatory without ataxia, Appears slow to answer questions, Alert to person, place however not time (2021) Other:    Medical Decision Making  Medically screening exam initiated at 12:24 PM.  Appropriate orders placed.  Harold Clements was informed that the remainder of the evaluation will be completed by another provider, this initial triage assessment does not replace that evaluation, and the importance of remaining in the ED until their evaluation is complete.  LKN > 24 hours ago  Stroke/ AMS work up started  Patient is not a code stroke, does not meet LVO criteria   Tine Mabee A, PA-C 12/26/20 1229    Pattricia Boss, MD 12/27/20 1148

## 2020-12-26 NOTE — Progress Notes (Deleted)
Patient ID: Harold Clements                 DOB: May 15, 1980                      MRN: [id number]     HPI: Harold Clements is a 40 y.o. male referred by Dr. Oval Linsey to HTN clinic. PMH is significant forchronic heart failure, (EF at 33%), hypertension, CKD 3 (GFR 24), diabetes (A1c 7.6) , OSA (states now compliant with CPAP), stroke, and medication noncompliance.  Needs CBC rechck  Current HTN meds:   Amlodipine 53m Carvedilol 247mBID Hydralazine 10083mID Minoxidil 22m54mD  Previously tried:  BP goal:   Family History:   Social History:   Diet:   Exercise:   Home BP readings:   Wt Readings from Last 3 Encounters:  11/28/20 259 lb (117.5 kg)  10/18/20 259 lb (117.5 kg)  10/03/20 259 lb 8 oz (117.7 kg)   BP Readings from Last 3 Encounters:  11/28/20 (!) 150/98  10/18/20 (!) 222/150  10/03/20 137/80   Pulse Readings from Last 3 Encounters:  11/28/20 92  10/18/20 89  10/03/20 93    Renal function: CrCl cannot be calculated (Patient's most recent lab result is older than the maximum 21 days allowed.).  Past Medical History:  Diagnosis Date   CKD (chronic kidney disease) stage 2, GFR 60-89 ml/min    Diabetes mellitus without complication (HCC)[place] Ear pain 07/2017   Hypertension    Stroke (HCC)[place] Wasp sting 08/04/2016   of dorsum of left hand near thumb    Current Outpatient Medications on File Prior to Visit  Medication Sig Dispense Refill   ACCU-CHEK GUIDE test strip USE 1 TEST STRIP UP TO 4 TIMES DAILY AS DIRECTED     Accu-Chek Softclix Lancets lancets SMARTSIG:1 Topical 4 Times Daily     amLODipine (NORVASC) 10 MG tablet Take 1 tablet (10 mg total) by mouth daily. 30 tablet 1   aspirin EC 81 MG tablet Take 81 mg by mouth daily. Swallow whole.     blood glucose meter kit and supplies KIT Dispense based on patient and insurance preference. Use up to four times daily as directed. 1 each 0   carvedilol (COREG) 25 MG tablet Take 1 tablet (25 mg total) by mouth  2 (two) times daily with a meal. 60 tablet 0   hydrALAZINE (APRESOLINE) 100 MG tablet Take 100 mg by mouth 3 (three) times daily.     insulin aspart protamine- aspart (NOVOLOG MIX 70/30) (70-30) 100 UNIT/ML injection Inject 0.05 mLs (5 Units total) into the skin 2 (two) times daily with a meal. 3 mL 0   minoxidil (LONITEN) 10 MG tablet Take 1 tablet (10 mg total) by mouth 2 (two) times daily. 60 tablet 11   Vitamin D, Ergocalciferol, (DRISDOL) 1.25 MG (50000 UNIT) CAPS capsule Take 50,000 Units by mouth every 7 (seven) days.     No current facility-administered medications on file prior to visit.    Allergies  Allergen Reactions   Zithromax [Azithromycin Dihydrate] Anaphylaxis   Kiwi Extract Other (See Comments)    Tongue goes numb     Assessment/Plan:  1. Hypertension -

## 2020-12-26 NOTE — ED Triage Notes (Addendum)
Patient here with complaint of generalized weakness that started last night. Denies shortness of breath, denies pain. Patient alert and in no apparent distress at this time. Patient is dysarthric, not aphasic, no arm drift, no facial droop.

## 2020-12-26 NOTE — ED Provider Notes (Signed)
[facility] EMERGENCY DEPARTMENT Provider Note  CSN: [id number] Arrival date & time: 12/26/20 1042    History Chief Complaint  Patient presents with   Weakness    Harold Clements is a 40 y.o. male with history of HTN, DM, CKD and prior stroke (right basal ganglia in Nov 2020, no deficits) was brought to the ED by his wife who reports he has been weak and confused since this morning. He was in his usual state of health this morning when he took his two older kids to school. He came home and got something to eat but then began to feel weak. He felt dizzy with standing, denies having any chest pain, SOB, fever, cough, congestion, nausea vomiting or diarrhea. He thinks his R side was weaker than the L but he was mostly just weak all over. Wife reports he is inconsistent with taking his BP meds and usually runs pretty high due to his non-compliance. He was actually scheduled to see Cardiology HTN clinic today for follow up. He denies any new medications, unsure if he might have taken extra BP meds today or not. He states he is feeling better since waiting for a room.    Past Medical History:  Diagnosis Date   CKD (chronic kidney disease) stage 2, GFR 60-89 ml/min    Diabetes mellitus without complication ([place])    Ear pain 07/2017   Hypertension    Stroke ([place])    Wasp sting 08/04/2016   of dorsum of left hand near thumb    Past Surgical History:  Procedure Laterality Date   BUBBLE STUDY  02/02/2019   Procedure: BUBBLE STUDY;  Surgeon: Lelon Perla, MD;  Location: [facility];  Service: Cardiovascular;;   TEE WITHOUT CARDIOVERSION N/A 02/02/2019   Procedure: TRANSESOPHAGEAL ECHOCARDIOGRAM (TEE);  Surgeon: Lelon Perla, MD;  Location: [facility] ENDOSCOPY;  Service: Cardiovascular;  Laterality: N/A;    Family History  Problem Relation Age of Onset   Hypertension Mother    Diabetes Mother    Heart disease Mother    Heart attack Mother        young. unsure age   Hypertension Father     Heart disease Brother     Social History   Tobacco Use   Smoking status: Former    Types: Cigars    Quit date: 2010    Years since quitting: 12.8   Smokeless tobacco: Never  Vaping Use   Vaping Use: Never used  Substance Use Topics   Alcohol use: No   Drug use: No     Home Medications Prior to Admission medications   Medication Sig Start Date End Date Taking? Authorizing Provider  ACCU-CHEK GUIDE test strip USE 1 TEST STRIP UP TO 4 TIMES DAILY AS DIRECTED 10/03/20   [provider]  Accu-Chek Softclix Lancets lancets SMARTSIG:1 Topical 4 Times Daily 10/03/20   [provider]  amLODipine (NORVASC) 10 MG tablet Take 1 tablet (10 mg total) by mouth daily. 10/03/20 12/02/20  Antonieta Pert, MD  aspirin EC 81 MG tablet Take 81 mg by mouth daily. Swallow whole.    [provider]  blood glucose meter kit and supplies KIT Dispense based on patient and insurance preference. Use up to four times daily as directed. 10/03/20   Antonieta Pert, MD  carvedilol (COREG) 25 MG tablet Take 1 tablet (25 mg total) by mouth 2 (two) times daily with a meal. 10/03/20 07/13/22  Antonieta Pert, MD  hydrALAZINE (APRESOLINE) 100 MG  tablet Take 100 mg by mouth 3 (three) times daily.    [provider]  insulin aspart protamine- aspart (NOVOLOG MIX 70/30) (70-30) 100 UNIT/ML injection Inject 0.05 mLs (5 Units total) into the skin 2 (two) times daily with a meal. 10/03/20 07/15/21  Antonieta Pert, MD  minoxidil (LONITEN) 10 MG tablet Take 1 tablet (10 mg total) by mouth 2 (two) times daily. 09/04/20   Croitoru, Mihai, MD  Vitamin D, Ergocalciferol, (DRISDOL) 1.25 MG (50000 UNIT) CAPS capsule Take 50,000 Units by mouth every 7 (seven) days.    [provider]     Allergies    Zithromax [azithromycin dihydrate] and Kiwi extract   Review of Systems   Review of Systems A comprehensive review of systems was completed and negative except as noted in HPI.    Physical Exam BP (!) 151/82    Pulse 93   Temp 98.5 F (36.9 C) (Oral)   Resp (!) 23   SpO2 99%   Physical Exam Vitals and nursing note reviewed.  Constitutional:      Appearance: Normal appearance.  HENT:     Head: Normocephalic and atraumatic.     Nose: Nose normal.     Mouth/Throat:     Mouth: Mucous membranes are moist.  Eyes:     Extraocular Movements: Extraocular movements intact.     Conjunctiva/sclera: Conjunctivae normal.  Cardiovascular:     Rate and Rhythm: Normal rate.  Pulmonary:     Effort: Pulmonary effort is normal.     Breath sounds: Normal breath sounds.  Abdominal:     General: Abdomen is flat.     Palpations: Abdomen is soft.     Tenderness: There is no abdominal tenderness.  Musculoskeletal:        General: No swelling. Normal range of motion.     Cervical back: Neck supple.  Skin:    General: Skin is warm and dry.  Neurological:     General: No focal deficit present.     Mental Status: He is alert and oriented to person, place, and time.     Cranial Nerves: No cranial nerve deficit.     Sensory: No sensory deficit.     Motor: No weakness.     Coordination: Coordination normal.     Comments: He is awake but slow to respond to questions; answers questions appropriately  Psychiatric:        Mood and Affect: Mood normal.     ED Results / Procedures / Treatments   Labs (all labs ordered are listed, but only abnormal results are displayed) Labs Reviewed  CBC WITH DIFFERENTIAL/PLATELET - Abnormal; Notable for the following components:      Result Value   MCV 79.7 (*)    All other components within normal limits  COMPREHENSIVE METABOLIC PANEL - Abnormal; Notable for the following components:   Glucose, Bld 170 (*)    BUN 30 (*)    Creatinine, Ser 3.33 (*)    GFR, Estimated 23 (*)    All other components within normal limits  URINALYSIS, ROUTINE W REFLEX MICROSCOPIC - Abnormal; Notable for the following components:   APPearance HAZY (*)    Glucose, UA 50 (*)    Protein, ur  100 (*)    All other components within normal limits  CBG MONITORING, ED - Abnormal; Notable for the following components:   Glucose-Capillary 184 (*)    All other components within normal limits  I-STAT VENOUS BLOOD GAS, ED - Abnormal; Notable  for the following components:   pO2, Ven 106.0 (*)    All other components within normal limits  TROPONIN I (HIGH SENSITIVITY) - Abnormal; Notable for the following components:   Troponin I (High Sensitivity) 25 (*)    All other components within normal limits  TROPONIN I (HIGH SENSITIVITY) - Abnormal; Notable for the following components:   Troponin I (High Sensitivity) 30 (*)    All other components within normal limits  RAPID URINE DRUG SCREEN, HOSP PERFORMED  PROTIME-INR    EKG EKG Interpretation  Date/Time:  Thursday December 26 2020 16:03:13 EDT Ventricular Rate:  80 PR Interval:  177 QRS Duration: 102 QT Interval:  427 QTC Calculation: 493 R Axis:   92 Text Interpretation: Sinus rhythm Left atrial enlargement Inferior infarct, old Lateral leads are also involved No significant change since last tracing EARLIER SAME DATE Confirmed by Calvert Cantor [phone]) on 12/26/2020 4:24:07 PM  Radiology DG Chest 2 View  Result Date: 12/26/2020 CLINICAL DATA:  Shortness of breath and midline chest pain since yesterday, altered mental status, hypertension, diabetes mellitus, former smoker EXAM: CHEST - 2 VIEW COMPARISON:  11/27/2018 FINDINGS: Enlargement of cardiac silhouette. Mediastinal contours and pulmonary vascularity normal. Decreased lung volumes without pulmonary infiltrate, pleural effusion, or pneumothorax. No acute osseous findings. IMPRESSION: Enlargement of cardiac silhouette. Decreased lung volumes without acute infiltrate. Electronically Signed   By: Lavonia Dana M.D.   On: 12/26/2020 13:53   CT HEAD WO CONTRAST (5MM)  Result Date: 12/26/2020 CLINICAL DATA:  Delirium. EXAM: CT HEAD WITHOUT CONTRAST TECHNIQUE: Contiguous axial  images were obtained from the base of the skull through the vertex without intravenous contrast. COMPARISON:  Brain MRI 01/11/2019. MRA head 01/11/2019. Head CT 01/11/2019. FINDINGS: Brain: Cerebral volume is normal for age. Redemonstrated chronic lacunar infarcts within the right corona radiata/basal ganglia and left thalamus. There is no acute intracranial hemorrhage. No demarcated cortical infarct. No extra-axial fluid collection. No evidence of an intracranial mass. No midline shift. Vascular: No hyperdense vessel. Skull: Normal. Negative for fracture or focal lesion. Sinuses/Orbits: Visualized orbits show no acute finding. Small mucous retention cyst within the right maxillary sinus. Trace mucosal thickening within the bilateral ethmoid air cells. IMPRESSION: No evidence of acute intracranial abnormality. Redemonstrated chronic lacunar infarcts within the right corona radiata/basal ganglia and left thalamus. Mild paranasal sinus disease, as described. Electronically Signed   By: Kellie Simmering D.O.   On: 12/26/2020 13:36    Procedures Procedures  Medications Ordered in the ED Medications  lactated ringers bolus 500 mL (0 mLs Intravenous Stopped 12/26/20 1959)     MDM Rules/Calculators/A&P MDM  Patient' here with general weakness, no focal deficit. His initial BP was much lower than his baseline, has come up some now. Will check orthostatics. He otherwise is in no distress. Labs ordered in triage show normal CBC, CMP with CKD at recent baseline. Initial Trop 25 is improved from admission earlier this year. CT without acute process.  ED Course  I have reviewed the triage vital signs and the nursing notes.  Pertinent labs & imaging results that were available during my care of the patient were reviewed by me and considered in my medical decision making (see chart for details).  Clinical Course as of 12/26/20 2113  Thu Dec 26, 2020  1643 Patient with borderline orthostatic BP/HR with standing.  Will give some IVF and reassess.  [CS]  6045 VBG is normal. Repeat Trop not significantly changed.  [CS]  2026 Patient still sleepy but more  prompt with answering questions. Will check if he is able to walk and then dispo accordingly.  [CS]  2027 UA and UDS unremarkable.  [CS]  2110 Patient able to ambulate without difficulty. Sitting up on the edge of the bed, awake and alert and back to baseline. He feels well enough to go home. Recommend he follow up with PCP or RTED if his symptoms return. Encouraged to take medications as prescribed.  [CS]    Clinical Course User Index [CS] Truddie Hidden, MD    Final Clinical Impression(s) / ED Diagnoses Final diagnoses:  Generalized weakness    Rx / DC Orders ED Discharge Orders     None        Truddie Hidden, MD 12/26/20 2113

## 2020-12-27 ENCOUNTER — Telehealth: Payer: Self-pay

## 2020-12-27 NOTE — Telephone Encounter (Signed)
Transition Care Management Unsuccessful Follow-up Telephone Call  Date of discharge and from where:  12/26/2020-[facility]   Attempts:  1st Attempt  Reason for unsuccessful TCM follow-up call:  Left voice message

## 2020-12-31 NOTE — Telephone Encounter (Signed)
Transition Care Management Follow-up Telephone Call Date of discharge and from where: 12/26/2020 from [facility] How have you been since you were released from the hospital? Pt states that he is feeling much better. Pt did not have any questions or concerns at this time.  Any questions or concerns? No  Items Reviewed: Did the pt receive and understand the discharge instructions provided? Yes  Medications obtained and verified? Yes  Other? No  Any new allergies since your discharge? No  Dietary orders reviewed? No Do you have support at home? Yes   Functional Questionnaire: (I = Independent and D = Dependent) ADLs: I  Bathing/Dressing- I  Meal Prep- I  Eating- I  Maintaining continence- I  Transferring/Ambulation- I  Managing Meds- I   Follow up appointments reviewed:  PCP Hospital f/u appt confirmed? No   Specialist Hospital f/u appt confirmed? No   Are transportation arrangements needed? No  If their condition worsens, is the pt aware to call PCP or go to the Emergency Dept.? Yes Was the patient provided with contact information for the PCP's office or ED? Yes Was to pt encouraged to call back with questions or concerns? Yes

## 2021-02-10 ENCOUNTER — Telehealth: Payer: Self-pay | Admitting: Cardiovascular Disease

## 2021-02-10 MED ORDER — MINOXIDIL 10 MG PO TABS
10.0000 mg | ORAL_TABLET | Freq: Two times a day (BID) | ORAL | 2 refills | Status: DC
Start: 2021-02-10 — End: 2021-05-06

## 2021-02-10 NOTE — Telephone Encounter (Signed)
Rx(s) sent to pharmacy electronically.  

## 2021-02-10 NOTE — Telephone Encounter (Signed)
*  STAT* If patient is at the pharmacy, call can be transferred to refill team.   1. Which medications need to be refilled? (please list name of each medication and dose if known) minoxidil (LONITEN) 10 MG tablet  2. Which pharmacy/location (including street and city if local pharmacy) is medication to be sent to? [place], [place] [address]  3. Do they need a 30 day or 90 day supply?  90 day supply

## 2021-04-02 ENCOUNTER — Encounter: Payer: Self-pay | Admitting: Cardiovascular Disease

## 2021-04-02 NOTE — Telephone Encounter (Signed)
Error

## 2021-05-06 ENCOUNTER — Encounter: Payer: Self-pay | Admitting: Pharmacist Clinician (PhC)/ Clinical Pharmacy Specialist

## 2021-05-06 DIAGNOSIS — I10 Essential (primary) hypertension: Secondary | ICD-10-CM

## 2021-05-06 MED ORDER — MINOXIDIL 10 MG PO TABS
10.0000 mg | ORAL_TABLET | Freq: Two times a day (BID) | ORAL | 1 refills | Status: AC
Start: 2021-05-06 — End: ?

## 2023-06-16 DIAGNOSIS — I10 Essential (primary) hypertension: Secondary | ICD-10-CM | POA: Diagnosis not present

## 2023-06-16 DIAGNOSIS — E119 Type 2 diabetes mellitus without complications: Secondary | ICD-10-CM | POA: Diagnosis not present

## 2023-08-22 DIAGNOSIS — E8770 Fluid overload, unspecified: Secondary | ICD-10-CM | POA: Diagnosis not present

## 2023-08-22 DIAGNOSIS — N186 End stage renal disease: Secondary | ICD-10-CM | POA: Diagnosis not present

## 2023-08-22 DIAGNOSIS — I120 Hypertensive chronic kidney disease with stage 5 chronic kidney disease or end stage renal disease: Secondary | ICD-10-CM | POA: Diagnosis not present

## 2023-08-22 DIAGNOSIS — N179 Acute kidney failure, unspecified: Secondary | ICD-10-CM | POA: Diagnosis not present

## 2023-08-22 DIAGNOSIS — E8729 Other acidosis: Secondary | ICD-10-CM | POA: Diagnosis not present

## 2023-08-23 DIAGNOSIS — E8770 Fluid overload, unspecified: Secondary | ICD-10-CM | POA: Diagnosis not present

## 2023-08-23 DIAGNOSIS — Z992 Dependence on renal dialysis: Secondary | ICD-10-CM | POA: Diagnosis not present

## 2023-08-23 DIAGNOSIS — N186 End stage renal disease: Secondary | ICD-10-CM | POA: Diagnosis not present

## 2023-08-23 DIAGNOSIS — N185 Chronic kidney disease, stage 5: Secondary | ICD-10-CM | POA: Diagnosis not present

## 2023-08-23 DIAGNOSIS — N179 Acute kidney failure, unspecified: Secondary | ICD-10-CM | POA: Diagnosis not present

## 2023-08-23 DIAGNOSIS — E1122 Type 2 diabetes mellitus with diabetic chronic kidney disease: Secondary | ICD-10-CM | POA: Diagnosis not present

## 2023-08-23 DIAGNOSIS — I120 Hypertensive chronic kidney disease with stage 5 chronic kidney disease or end stage renal disease: Secondary | ICD-10-CM | POA: Diagnosis not present

## 2023-08-24 DIAGNOSIS — N186 End stage renal disease: Secondary | ICD-10-CM | POA: Diagnosis not present

## 2023-08-24 DIAGNOSIS — I120 Hypertensive chronic kidney disease with stage 5 chronic kidney disease or end stage renal disease: Secondary | ICD-10-CM | POA: Diagnosis not present

## 2023-08-24 DIAGNOSIS — N179 Acute kidney failure, unspecified: Secondary | ICD-10-CM | POA: Diagnosis not present

## 2023-08-24 DIAGNOSIS — I429 Cardiomyopathy, unspecified: Secondary | ICD-10-CM | POA: Diagnosis not present

## 2023-08-24 DIAGNOSIS — I517 Cardiomegaly: Secondary | ICD-10-CM | POA: Diagnosis not present

## 2023-08-24 DIAGNOSIS — I161 Hypertensive emergency: Secondary | ICD-10-CM | POA: Diagnosis not present

## 2023-08-25 DIAGNOSIS — I429 Cardiomyopathy, unspecified: Secondary | ICD-10-CM | POA: Diagnosis not present

## 2023-08-25 DIAGNOSIS — Z992 Dependence on renal dialysis: Secondary | ICD-10-CM | POA: Diagnosis not present

## 2023-08-25 DIAGNOSIS — I161 Hypertensive emergency: Secondary | ICD-10-CM | POA: Diagnosis not present

## 2023-08-25 DIAGNOSIS — E8770 Fluid overload, unspecified: Secondary | ICD-10-CM | POA: Diagnosis not present

## 2023-08-25 DIAGNOSIS — N186 End stage renal disease: Secondary | ICD-10-CM | POA: Diagnosis not present

## 2023-08-25 DIAGNOSIS — I639 Cerebral infarction, unspecified: Secondary | ICD-10-CM | POA: Diagnosis not present

## 2023-08-25 DIAGNOSIS — N179 Acute kidney failure, unspecified: Secondary | ICD-10-CM | POA: Diagnosis not present

## 2023-08-25 DIAGNOSIS — I517 Cardiomegaly: Secondary | ICD-10-CM | POA: Diagnosis not present

## 2023-08-25 DIAGNOSIS — I120 Hypertensive chronic kidney disease with stage 5 chronic kidney disease or end stage renal disease: Secondary | ICD-10-CM | POA: Diagnosis not present

## 2023-08-27 DIAGNOSIS — D638 Anemia in other chronic diseases classified elsewhere: Secondary | ICD-10-CM | POA: Diagnosis not present

## 2023-08-27 DIAGNOSIS — G4733 Obstructive sleep apnea (adult) (pediatric): Secondary | ICD-10-CM | POA: Diagnosis not present

## 2023-08-27 DIAGNOSIS — N179 Acute kidney failure, unspecified: Secondary | ICD-10-CM | POA: Diagnosis not present

## 2023-08-27 DIAGNOSIS — Z992 Dependence on renal dialysis: Secondary | ICD-10-CM | POA: Diagnosis not present

## 2023-08-27 DIAGNOSIS — R531 Weakness: Secondary | ICD-10-CM | POA: Diagnosis not present

## 2023-08-27 DIAGNOSIS — N184 Chronic kidney disease, stage 4 (severe): Secondary | ICD-10-CM | POA: Diagnosis not present

## 2023-08-27 DIAGNOSIS — I120 Hypertensive chronic kidney disease with stage 5 chronic kidney disease or end stage renal disease: Secondary | ICD-10-CM | POA: Diagnosis not present

## 2023-08-27 DIAGNOSIS — E1122 Type 2 diabetes mellitus with diabetic chronic kidney disease: Secondary | ICD-10-CM | POA: Diagnosis not present

## 2023-08-27 DIAGNOSIS — N186 End stage renal disease: Secondary | ICD-10-CM | POA: Diagnosis not present

## 2023-08-27 DIAGNOSIS — I639 Cerebral infarction, unspecified: Secondary | ICD-10-CM | POA: Diagnosis not present

## 2023-08-27 DIAGNOSIS — E8770 Fluid overload, unspecified: Secondary | ICD-10-CM | POA: Diagnosis not present

## 2023-08-27 DIAGNOSIS — I161 Hypertensive emergency: Secondary | ICD-10-CM | POA: Diagnosis not present

## 2023-08-27 DIAGNOSIS — R7989 Other specified abnormal findings of blood chemistry: Secondary | ICD-10-CM | POA: Diagnosis not present

## 2023-08-28 DIAGNOSIS — E1122 Type 2 diabetes mellitus with diabetic chronic kidney disease: Secondary | ICD-10-CM | POA: Diagnosis not present

## 2023-08-28 DIAGNOSIS — R7989 Other specified abnormal findings of blood chemistry: Secondary | ICD-10-CM | POA: Diagnosis not present

## 2023-08-28 DIAGNOSIS — J9601 Acute respiratory failure with hypoxia: Secondary | ICD-10-CM | POA: Diagnosis not present

## 2023-08-28 DIAGNOSIS — N184 Chronic kidney disease, stage 4 (severe): Secondary | ICD-10-CM | POA: Diagnosis not present

## 2023-08-28 DIAGNOSIS — D638 Anemia in other chronic diseases classified elsewhere: Secondary | ICD-10-CM | POA: Diagnosis not present

## 2023-08-28 DIAGNOSIS — E1169 Type 2 diabetes mellitus with other specified complication: Secondary | ICD-10-CM | POA: Diagnosis not present

## 2023-08-28 DIAGNOSIS — I639 Cerebral infarction, unspecified: Secondary | ICD-10-CM | POA: Diagnosis not present

## 2023-08-28 DIAGNOSIS — J690 Pneumonitis due to inhalation of food and vomit: Secondary | ICD-10-CM | POA: Diagnosis not present

## 2023-08-28 DIAGNOSIS — G4733 Obstructive sleep apnea (adult) (pediatric): Secondary | ICD-10-CM | POA: Diagnosis not present

## 2023-08-28 DIAGNOSIS — N179 Acute kidney failure, unspecified: Secondary | ICD-10-CM | POA: Diagnosis not present

## 2023-08-28 DIAGNOSIS — R531 Weakness: Secondary | ICD-10-CM | POA: Diagnosis not present

## 2023-08-28 DIAGNOSIS — I161 Hypertensive emergency: Secondary | ICD-10-CM | POA: Diagnosis not present

## 2023-08-29 DIAGNOSIS — E8770 Fluid overload, unspecified: Secondary | ICD-10-CM | POA: Diagnosis not present

## 2023-08-29 DIAGNOSIS — D638 Anemia in other chronic diseases classified elsewhere: Secondary | ICD-10-CM | POA: Diagnosis not present

## 2023-08-29 DIAGNOSIS — R7989 Other specified abnormal findings of blood chemistry: Secondary | ICD-10-CM | POA: Diagnosis not present

## 2023-08-29 DIAGNOSIS — N184 Chronic kidney disease, stage 4 (severe): Secondary | ICD-10-CM | POA: Diagnosis not present

## 2023-08-29 DIAGNOSIS — G4733 Obstructive sleep apnea (adult) (pediatric): Secondary | ICD-10-CM | POA: Diagnosis not present

## 2023-08-29 DIAGNOSIS — I120 Hypertensive chronic kidney disease with stage 5 chronic kidney disease or end stage renal disease: Secondary | ICD-10-CM | POA: Diagnosis not present

## 2023-08-29 DIAGNOSIS — J690 Pneumonitis due to inhalation of food and vomit: Secondary | ICD-10-CM | POA: Diagnosis not present

## 2023-08-29 DIAGNOSIS — J9601 Acute respiratory failure with hypoxia: Secondary | ICD-10-CM | POA: Diagnosis not present

## 2023-08-29 DIAGNOSIS — E1169 Type 2 diabetes mellitus with other specified complication: Secondary | ICD-10-CM | POA: Diagnosis not present

## 2023-08-29 DIAGNOSIS — N185 Chronic kidney disease, stage 5: Secondary | ICD-10-CM | POA: Diagnosis not present

## 2023-08-29 DIAGNOSIS — N179 Acute kidney failure, unspecified: Secondary | ICD-10-CM | POA: Diagnosis not present

## 2023-08-29 DIAGNOSIS — R531 Weakness: Secondary | ICD-10-CM | POA: Diagnosis not present

## 2023-08-29 DIAGNOSIS — I161 Hypertensive emergency: Secondary | ICD-10-CM | POA: Diagnosis not present

## 2023-08-29 DIAGNOSIS — Z992 Dependence on renal dialysis: Secondary | ICD-10-CM | POA: Diagnosis not present

## 2023-08-29 DIAGNOSIS — I639 Cerebral infarction, unspecified: Secondary | ICD-10-CM | POA: Diagnosis not present

## 2023-08-29 DIAGNOSIS — N186 End stage renal disease: Secondary | ICD-10-CM | POA: Diagnosis not present

## 2023-08-29 DIAGNOSIS — E1122 Type 2 diabetes mellitus with diabetic chronic kidney disease: Secondary | ICD-10-CM | POA: Diagnosis not present

## 2023-08-30 DIAGNOSIS — E1169 Type 2 diabetes mellitus with other specified complication: Secondary | ICD-10-CM | POA: Diagnosis not present

## 2023-08-30 DIAGNOSIS — I21A1 Myocardial infarction type 2: Secondary | ICD-10-CM | POA: Diagnosis not present

## 2023-08-30 DIAGNOSIS — I351 Nonrheumatic aortic (valve) insufficiency: Secondary | ICD-10-CM | POA: Diagnosis not present

## 2023-08-30 DIAGNOSIS — E8770 Fluid overload, unspecified: Secondary | ICD-10-CM | POA: Diagnosis not present

## 2023-08-30 DIAGNOSIS — R531 Weakness: Secondary | ICD-10-CM | POA: Diagnosis not present

## 2023-08-30 DIAGNOSIS — J9601 Acute respiratory failure with hypoxia: Secondary | ICD-10-CM | POA: Diagnosis not present

## 2023-08-30 DIAGNOSIS — I120 Hypertensive chronic kidney disease with stage 5 chronic kidney disease or end stage renal disease: Secondary | ICD-10-CM | POA: Diagnosis not present

## 2023-08-30 DIAGNOSIS — N184 Chronic kidney disease, stage 4 (severe): Secondary | ICD-10-CM | POA: Diagnosis not present

## 2023-08-30 DIAGNOSIS — I161 Hypertensive emergency: Secondary | ICD-10-CM | POA: Diagnosis not present

## 2023-08-30 DIAGNOSIS — Z992 Dependence on renal dialysis: Secondary | ICD-10-CM | POA: Diagnosis not present

## 2023-08-30 DIAGNOSIS — G4733 Obstructive sleep apnea (adult) (pediatric): Secondary | ICD-10-CM | POA: Diagnosis not present

## 2023-08-30 DIAGNOSIS — N179 Acute kidney failure, unspecified: Secondary | ICD-10-CM | POA: Diagnosis not present

## 2023-08-30 DIAGNOSIS — D638 Anemia in other chronic diseases classified elsewhere: Secondary | ICD-10-CM | POA: Diagnosis not present

## 2023-08-30 DIAGNOSIS — N185 Chronic kidney disease, stage 5: Secondary | ICD-10-CM | POA: Diagnosis not present

## 2023-08-30 DIAGNOSIS — J690 Pneumonitis due to inhalation of food and vomit: Secondary | ICD-10-CM | POA: Diagnosis not present

## 2023-08-30 DIAGNOSIS — R7989 Other specified abnormal findings of blood chemistry: Secondary | ICD-10-CM | POA: Diagnosis not present

## 2023-08-30 DIAGNOSIS — N186 End stage renal disease: Secondary | ICD-10-CM | POA: Diagnosis not present

## 2023-08-30 DIAGNOSIS — E1122 Type 2 diabetes mellitus with diabetic chronic kidney disease: Secondary | ICD-10-CM | POA: Diagnosis not present

## 2023-08-30 DIAGNOSIS — I639 Cerebral infarction, unspecified: Secondary | ICD-10-CM | POA: Diagnosis not present

## 2023-08-31 DIAGNOSIS — I120 Hypertensive chronic kidney disease with stage 5 chronic kidney disease or end stage renal disease: Secondary | ICD-10-CM | POA: Diagnosis not present

## 2023-08-31 DIAGNOSIS — N186 End stage renal disease: Secondary | ICD-10-CM | POA: Diagnosis not present

## 2023-08-31 DIAGNOSIS — I639 Cerebral infarction, unspecified: Secondary | ICD-10-CM | POA: Diagnosis not present

## 2023-08-31 DIAGNOSIS — E1122 Type 2 diabetes mellitus with diabetic chronic kidney disease: Secondary | ICD-10-CM | POA: Diagnosis not present

## 2023-08-31 DIAGNOSIS — D638 Anemia in other chronic diseases classified elsewhere: Secondary | ICD-10-CM | POA: Diagnosis not present

## 2023-08-31 DIAGNOSIS — I161 Hypertensive emergency: Secondary | ICD-10-CM | POA: Diagnosis not present

## 2023-08-31 DIAGNOSIS — Z992 Dependence on renal dialysis: Secondary | ICD-10-CM | POA: Diagnosis not present

## 2023-08-31 DIAGNOSIS — G4733 Obstructive sleep apnea (adult) (pediatric): Secondary | ICD-10-CM | POA: Diagnosis not present

## 2023-08-31 DIAGNOSIS — N184 Chronic kidney disease, stage 4 (severe): Secondary | ICD-10-CM | POA: Diagnosis not present

## 2023-08-31 DIAGNOSIS — E8770 Fluid overload, unspecified: Secondary | ICD-10-CM | POA: Diagnosis not present

## 2023-08-31 DIAGNOSIS — N185 Chronic kidney disease, stage 5: Secondary | ICD-10-CM | POA: Diagnosis not present

## 2023-08-31 DIAGNOSIS — N179 Acute kidney failure, unspecified: Secondary | ICD-10-CM | POA: Diagnosis not present

## 2023-08-31 DIAGNOSIS — R531 Weakness: Secondary | ICD-10-CM | POA: Diagnosis not present

## 2023-08-31 DIAGNOSIS — R7989 Other specified abnormal findings of blood chemistry: Secondary | ICD-10-CM | POA: Diagnosis not present

## 2023-08-31 DIAGNOSIS — J9601 Acute respiratory failure with hypoxia: Secondary | ICD-10-CM | POA: Diagnosis not present

## 2023-08-31 DIAGNOSIS — J690 Pneumonitis due to inhalation of food and vomit: Secondary | ICD-10-CM | POA: Diagnosis not present

## 2023-08-31 DIAGNOSIS — E1169 Type 2 diabetes mellitus with other specified complication: Secondary | ICD-10-CM | POA: Diagnosis not present

## 2023-09-01 DIAGNOSIS — N185 Chronic kidney disease, stage 5: Secondary | ICD-10-CM | POA: Diagnosis not present

## 2023-09-01 DIAGNOSIS — R531 Weakness: Secondary | ICD-10-CM | POA: Diagnosis not present

## 2023-09-01 DIAGNOSIS — N186 End stage renal disease: Secondary | ICD-10-CM | POA: Diagnosis not present

## 2023-09-01 DIAGNOSIS — N179 Acute kidney failure, unspecified: Secondary | ICD-10-CM | POA: Diagnosis not present

## 2023-09-01 DIAGNOSIS — Z992 Dependence on renal dialysis: Secondary | ICD-10-CM | POA: Diagnosis not present

## 2023-09-01 DIAGNOSIS — I120 Hypertensive chronic kidney disease with stage 5 chronic kidney disease or end stage renal disease: Secondary | ICD-10-CM | POA: Diagnosis not present

## 2023-09-01 DIAGNOSIS — E8770 Fluid overload, unspecified: Secondary | ICD-10-CM | POA: Diagnosis not present

## 2023-09-02 DIAGNOSIS — N185 Chronic kidney disease, stage 5: Secondary | ICD-10-CM | POA: Diagnosis not present

## 2023-09-02 DIAGNOSIS — R531 Weakness: Secondary | ICD-10-CM | POA: Diagnosis not present

## 2023-09-02 DIAGNOSIS — E8770 Fluid overload, unspecified: Secondary | ICD-10-CM | POA: Diagnosis not present

## 2023-09-02 DIAGNOSIS — I120 Hypertensive chronic kidney disease with stage 5 chronic kidney disease or end stage renal disease: Secondary | ICD-10-CM | POA: Diagnosis not present

## 2023-09-02 DIAGNOSIS — N179 Acute kidney failure, unspecified: Secondary | ICD-10-CM | POA: Diagnosis not present

## 2023-09-02 DIAGNOSIS — Z992 Dependence on renal dialysis: Secondary | ICD-10-CM | POA: Diagnosis not present

## 2023-09-02 DIAGNOSIS — R1312 Dysphagia, oropharyngeal phase: Secondary | ICD-10-CM | POA: Diagnosis not present

## 2023-09-02 DIAGNOSIS — N186 End stage renal disease: Secondary | ICD-10-CM | POA: Diagnosis not present

## 2023-09-03 DIAGNOSIS — N185 Chronic kidney disease, stage 5: Secondary | ICD-10-CM | POA: Diagnosis not present

## 2023-09-03 DIAGNOSIS — N186 End stage renal disease: Secondary | ICD-10-CM | POA: Diagnosis not present

## 2023-09-03 DIAGNOSIS — N179 Acute kidney failure, unspecified: Secondary | ICD-10-CM | POA: Diagnosis not present

## 2023-09-03 DIAGNOSIS — E8770 Fluid overload, unspecified: Secondary | ICD-10-CM | POA: Diagnosis not present

## 2023-09-03 DIAGNOSIS — Z992 Dependence on renal dialysis: Secondary | ICD-10-CM | POA: Diagnosis not present

## 2023-09-03 DIAGNOSIS — I120 Hypertensive chronic kidney disease with stage 5 chronic kidney disease or end stage renal disease: Secondary | ICD-10-CM | POA: Diagnosis not present

## 2023-09-03 DIAGNOSIS — R531 Weakness: Secondary | ICD-10-CM | POA: Diagnosis not present

## 2023-09-11 DIAGNOSIS — Z66 Do not resuscitate: Secondary | ICD-10-CM | POA: Diagnosis not present

## 2023-09-11 DIAGNOSIS — Z992 Dependence on renal dialysis: Secondary | ICD-10-CM | POA: Diagnosis not present

## 2023-09-11 DIAGNOSIS — N186 End stage renal disease: Secondary | ICD-10-CM | POA: Diagnosis not present

## 2023-09-14 DIAGNOSIS — Z992 Dependence on renal dialysis: Secondary | ICD-10-CM | POA: Diagnosis not present

## 2023-09-14 DIAGNOSIS — N186 End stage renal disease: Secondary | ICD-10-CM | POA: Diagnosis not present

## 2023-09-14 DIAGNOSIS — N179 Acute kidney failure, unspecified: Secondary | ICD-10-CM | POA: Diagnosis not present

## 2023-09-14 DIAGNOSIS — Z66 Do not resuscitate: Secondary | ICD-10-CM | POA: Diagnosis not present

## 2023-09-15 DIAGNOSIS — N185 Chronic kidney disease, stage 5: Secondary | ICD-10-CM | POA: Diagnosis not present

## 2023-09-15 DIAGNOSIS — G9341 Metabolic encephalopathy: Secondary | ICD-10-CM | POA: Diagnosis not present

## 2023-09-15 DIAGNOSIS — J9601 Acute respiratory failure with hypoxia: Secondary | ICD-10-CM | POA: Diagnosis not present

## 2023-09-15 DIAGNOSIS — I639 Cerebral infarction, unspecified: Secondary | ICD-10-CM | POA: Diagnosis not present

## 2023-09-15 DIAGNOSIS — N179 Acute kidney failure, unspecified: Secondary | ICD-10-CM | POA: Diagnosis not present

## 2023-09-15 DIAGNOSIS — J690 Pneumonitis due to inhalation of food and vomit: Secondary | ICD-10-CM | POA: Diagnosis not present

## 2023-09-17 DIAGNOSIS — N179 Acute kidney failure, unspecified: Secondary | ICD-10-CM | POA: Diagnosis not present

## 2023-09-17 DIAGNOSIS — Z992 Dependence on renal dialysis: Secondary | ICD-10-CM | POA: Diagnosis not present

## 2023-09-17 DIAGNOSIS — N185 Chronic kidney disease, stage 5: Secondary | ICD-10-CM | POA: Diagnosis not present

## 2023-09-18 DIAGNOSIS — G9341 Metabolic encephalopathy: Secondary | ICD-10-CM | POA: Diagnosis not present

## 2023-09-18 DIAGNOSIS — N185 Chronic kidney disease, stage 5: Secondary | ICD-10-CM | POA: Diagnosis not present

## 2023-09-18 DIAGNOSIS — Z992 Dependence on renal dialysis: Secondary | ICD-10-CM | POA: Diagnosis not present

## 2023-09-18 DIAGNOSIS — N179 Acute kidney failure, unspecified: Secondary | ICD-10-CM | POA: Diagnosis not present

## 2023-09-19 DIAGNOSIS — N179 Acute kidney failure, unspecified: Secondary | ICD-10-CM | POA: Diagnosis not present

## 2023-09-19 DIAGNOSIS — Z992 Dependence on renal dialysis: Secondary | ICD-10-CM | POA: Diagnosis not present

## 2023-09-19 DIAGNOSIS — G9341 Metabolic encephalopathy: Secondary | ICD-10-CM | POA: Diagnosis not present

## 2023-09-19 DIAGNOSIS — N185 Chronic kidney disease, stage 5: Secondary | ICD-10-CM | POA: Diagnosis not present

## 2023-09-20 DIAGNOSIS — N179 Acute kidney failure, unspecified: Secondary | ICD-10-CM | POA: Diagnosis not present

## 2023-09-20 DIAGNOSIS — N185 Chronic kidney disease, stage 5: Secondary | ICD-10-CM | POA: Diagnosis not present

## 2023-09-20 DIAGNOSIS — Z992 Dependence on renal dialysis: Secondary | ICD-10-CM | POA: Diagnosis not present

## 2023-09-21 DIAGNOSIS — N179 Acute kidney failure, unspecified: Secondary | ICD-10-CM | POA: Diagnosis not present

## 2023-09-21 DIAGNOSIS — Z992 Dependence on renal dialysis: Secondary | ICD-10-CM | POA: Diagnosis not present

## 2023-09-21 DIAGNOSIS — N185 Chronic kidney disease, stage 5: Secondary | ICD-10-CM | POA: Diagnosis not present

## 2023-09-22 DIAGNOSIS — R7989 Other specified abnormal findings of blood chemistry: Secondary | ICD-10-CM | POA: Diagnosis not present

## 2023-09-22 DIAGNOSIS — G9341 Metabolic encephalopathy: Secondary | ICD-10-CM | POA: Diagnosis not present

## 2023-09-22 DIAGNOSIS — I639 Cerebral infarction, unspecified: Secondary | ICD-10-CM | POA: Diagnosis not present

## 2023-09-22 DIAGNOSIS — Z992 Dependence on renal dialysis: Secondary | ICD-10-CM | POA: Diagnosis not present

## 2023-09-22 DIAGNOSIS — N186 End stage renal disease: Secondary | ICD-10-CM | POA: Diagnosis not present

## 2023-09-25 DIAGNOSIS — E1165 Type 2 diabetes mellitus with hyperglycemia: Secondary | ICD-10-CM | POA: Diagnosis not present

## 2023-09-25 DIAGNOSIS — Z992 Dependence on renal dialysis: Secondary | ICD-10-CM | POA: Diagnosis not present

## 2023-09-25 DIAGNOSIS — R7989 Other specified abnormal findings of blood chemistry: Secondary | ICD-10-CM | POA: Diagnosis not present

## 2023-09-25 DIAGNOSIS — G9341 Metabolic encephalopathy: Secondary | ICD-10-CM | POA: Diagnosis not present

## 2023-09-25 DIAGNOSIS — I639 Cerebral infarction, unspecified: Secondary | ICD-10-CM | POA: Diagnosis not present

## 2023-09-25 DIAGNOSIS — N186 End stage renal disease: Secondary | ICD-10-CM | POA: Diagnosis not present

## 2023-09-27 DIAGNOSIS — Z992 Dependence on renal dialysis: Secondary | ICD-10-CM | POA: Diagnosis not present

## 2023-09-27 DIAGNOSIS — I120 Hypertensive chronic kidney disease with stage 5 chronic kidney disease or end stage renal disease: Secondary | ICD-10-CM | POA: Diagnosis not present

## 2023-09-27 DIAGNOSIS — N186 End stage renal disease: Secondary | ICD-10-CM | POA: Diagnosis not present

## 2023-09-27 DIAGNOSIS — N179 Acute kidney failure, unspecified: Secondary | ICD-10-CM | POA: Diagnosis not present

## 2023-09-28 DIAGNOSIS — N185 Chronic kidney disease, stage 5: Secondary | ICD-10-CM | POA: Diagnosis not present

## 2023-09-28 DIAGNOSIS — N179 Acute kidney failure, unspecified: Secondary | ICD-10-CM | POA: Diagnosis not present

## 2023-09-29 DIAGNOSIS — E1129 Type 2 diabetes mellitus with other diabetic kidney complication: Secondary | ICD-10-CM | POA: Diagnosis not present

## 2023-09-29 DIAGNOSIS — Z992 Dependence on renal dialysis: Secondary | ICD-10-CM | POA: Diagnosis not present

## 2023-09-29 DIAGNOSIS — N186 End stage renal disease: Secondary | ICD-10-CM | POA: Diagnosis not present

## 2023-09-29 DIAGNOSIS — Z794 Long term (current) use of insulin: Secondary | ICD-10-CM | POA: Diagnosis not present

## 2023-09-29 DIAGNOSIS — I639 Cerebral infarction, unspecified: Secondary | ICD-10-CM | POA: Diagnosis not present

## 2023-09-30 DIAGNOSIS — N186 End stage renal disease: Secondary | ICD-10-CM | POA: Diagnosis not present

## 2023-09-30 DIAGNOSIS — Z992 Dependence on renal dialysis: Secondary | ICD-10-CM | POA: Diagnosis not present

## 2023-09-30 DIAGNOSIS — N179 Acute kidney failure, unspecified: Secondary | ICD-10-CM | POA: Diagnosis not present

## 2023-10-01 DIAGNOSIS — N186 End stage renal disease: Secondary | ICD-10-CM | POA: Diagnosis not present

## 2023-10-01 DIAGNOSIS — Z992 Dependence on renal dialysis: Secondary | ICD-10-CM | POA: Diagnosis not present
